# Patient Record
Sex: Female | Born: 1970 | Race: White | Hispanic: No | Smoking: Current some day smoker
Health system: Southern US, Community
[De-identification: ages and names within clinical notes are randomized; demographics above are authoritative.]

## PROBLEM LIST (undated history)

## (undated) DIAGNOSIS — J45909 Unspecified asthma, uncomplicated: Secondary | ICD-10-CM

## (undated) DIAGNOSIS — J439 Emphysema, unspecified: Secondary | ICD-10-CM

## (undated) DIAGNOSIS — C801 Malignant (primary) neoplasm, unspecified: Secondary | ICD-10-CM

## (undated) DIAGNOSIS — M659 Synovitis and tenosynovitis, unspecified: Secondary | ICD-10-CM

## (undated) DIAGNOSIS — J449 Chronic obstructive pulmonary disease, unspecified: Secondary | ICD-10-CM

## (undated) DIAGNOSIS — G629 Polyneuropathy, unspecified: Secondary | ICD-10-CM

## (undated) DIAGNOSIS — L0390 Cellulitis, unspecified: Secondary | ICD-10-CM

## (undated) DIAGNOSIS — M797 Fibromyalgia: Secondary | ICD-10-CM

## (undated) HISTORY — PX: BACK SURGERY: SHX140

## (undated) HISTORY — PX: TUBAL LIGATION: SHX77

## (undated) HISTORY — PX: TONSILLECTOMY: SUR1361

---

## 1997-11-14 ENCOUNTER — Inpatient Hospital Stay (HOSPITAL_COMMUNITY): Admission: EM | Admit: 1997-11-14 | Discharge: 1997-11-16 | Payer: Self-pay | Admitting: Emergency Medicine

## 1998-01-23 ENCOUNTER — Emergency Department (HOSPITAL_COMMUNITY): Admission: EM | Admit: 1998-01-23 | Discharge: 1998-01-23 | Payer: Self-pay | Admitting: Emergency Medicine

## 1998-04-11 ENCOUNTER — Emergency Department (HOSPITAL_COMMUNITY): Admission: EM | Admit: 1998-04-11 | Discharge: 1998-04-11 | Payer: Self-pay | Admitting: Emergency Medicine

## 1999-08-23 ENCOUNTER — Encounter: Payer: Self-pay | Admitting: Emergency Medicine

## 1999-08-23 ENCOUNTER — Emergency Department (HOSPITAL_COMMUNITY): Admission: EM | Admit: 1999-08-23 | Discharge: 1999-08-23 | Payer: Self-pay | Admitting: Emergency Medicine

## 2001-07-16 ENCOUNTER — Ambulatory Visit (HOSPITAL_COMMUNITY): Admission: RE | Admit: 2001-07-16 | Discharge: 2001-07-16 | Payer: Self-pay | Admitting: Neurosurgery

## 2004-02-23 ENCOUNTER — Emergency Department: Payer: Self-pay | Admitting: Unknown Physician Specialty

## 2004-04-18 ENCOUNTER — Emergency Department: Payer: Self-pay | Admitting: General Practice

## 2004-04-28 ENCOUNTER — Emergency Department: Payer: Self-pay | Admitting: Emergency Medicine

## 2004-04-29 ENCOUNTER — Emergency Department: Payer: Self-pay | Admitting: Emergency Medicine

## 2004-12-31 ENCOUNTER — Emergency Department: Payer: Self-pay | Admitting: General Practice

## 2005-01-01 ENCOUNTER — Emergency Department: Payer: Self-pay | Admitting: Emergency Medicine

## 2005-12-12 ENCOUNTER — Emergency Department: Payer: Self-pay

## 2006-06-29 ENCOUNTER — Emergency Department: Payer: Self-pay | Admitting: Emergency Medicine

## 2007-02-24 ENCOUNTER — Emergency Department: Payer: Self-pay | Admitting: Emergency Medicine

## 2007-07-04 ENCOUNTER — Emergency Department (HOSPITAL_COMMUNITY): Admission: EM | Admit: 2007-07-04 | Discharge: 2007-07-04 | Payer: Self-pay | Admitting: Emergency Medicine

## 2008-01-24 ENCOUNTER — Inpatient Hospital Stay: Payer: Self-pay | Admitting: *Deleted

## 2008-03-07 ENCOUNTER — Emergency Department: Payer: Self-pay | Admitting: Emergency Medicine

## 2008-03-14 ENCOUNTER — Encounter: Payer: Self-pay | Admitting: Internal Medicine

## 2008-04-09 ENCOUNTER — Encounter: Payer: Self-pay | Admitting: Internal Medicine

## 2009-09-09 ENCOUNTER — Emergency Department: Payer: Self-pay | Admitting: Emergency Medicine

## 2010-04-27 ENCOUNTER — Emergency Department: Payer: Self-pay | Admitting: Emergency Medicine

## 2010-05-19 ENCOUNTER — Encounter: Payer: Self-pay | Admitting: Nurse Practitioner

## 2010-05-24 ENCOUNTER — Emergency Department: Payer: Self-pay | Admitting: Emergency Medicine

## 2011-05-07 ENCOUNTER — Ambulatory Visit: Payer: Self-pay | Admitting: Family Medicine

## 2011-06-10 ENCOUNTER — Ambulatory Visit: Payer: Self-pay | Admitting: Family Medicine

## 2011-07-24 ENCOUNTER — Emergency Department: Payer: Self-pay | Admitting: Emergency Medicine

## 2011-08-17 ENCOUNTER — Ambulatory Visit: Payer: Self-pay | Admitting: Family Medicine

## 2011-09-22 ENCOUNTER — Emergency Department: Payer: Self-pay | Admitting: Emergency Medicine

## 2011-09-22 LAB — COMPREHENSIVE METABOLIC PANEL
Albumin: 3.1 g/dL — ABNORMAL LOW (ref 3.4–5.0)
Alkaline Phosphatase: 81 U/L (ref 50–136)
Anion Gap: 7 (ref 7–16)
BUN: 4 mg/dL — ABNORMAL LOW (ref 7–18)
Bilirubin,Total: 0.5 mg/dL (ref 0.2–1.0)
Calcium, Total: 8.2 mg/dL — ABNORMAL LOW (ref 8.5–10.1)
Creatinine: 0.63 mg/dL (ref 0.60–1.30)
EGFR (Non-African Amer.): >60
Osmolality: 276 (ref 275–301)
Potassium: 3.2 mmol/L — ABNORMAL LOW (ref 3.5–5.1)
SGOT(AST): 19 U/L (ref 15–37)

## 2011-09-22 LAB — URINALYSIS, COMPLETE
Bacteria: NONE SEEN
Bilirubin,UR: NEGATIVE
Blood: NEGATIVE
Glucose,UR: NEGATIVE mg/dL (ref 0–75)
Ketone: NEGATIVE
Leukocyte Esterase: NEGATIVE
Ph: 7 (ref 4.5–8.0)
RBC,UR: NONE SEEN /HPF (ref 0–5)
Specific Gravity: 1.002 (ref 1.003–1.030)
Squamous Epithelial: NONE SEEN

## 2011-09-22 LAB — CBC
HGB: 12.6 g/dL (ref 12.0–16.0)
MCH: 28.3 pg (ref 26.0–34.0)
MCHC: 32.4 g/dL (ref 32.0–36.0)
Platelet: 281 10*3/uL (ref 150–440)
RBC: 4.46 10*6/uL (ref 3.80–5.20)

## 2011-09-22 LAB — DRUG SCREEN, URINE
Amphetamines, Ur Screen: POSITIVE (ref ?–1000)
Barbiturates, Ur Screen: NEGATIVE (ref ?–200)
Cannabinoid 50 Ng, Ur ~~LOC~~: NEGATIVE (ref ?–50)
MDMA (Ecstasy)Ur Screen: NEGATIVE (ref ?–500)
Methadone, Ur Screen: NEGATIVE (ref ?–300)
Opiate, Ur Screen: POSITIVE (ref ?–300)
Phencyclidine (PCP) Ur S: NEGATIVE (ref ?–25)
Tricyclic, Ur Screen: NEGATIVE (ref ?–1000)

## 2011-09-22 LAB — ACETAMINOPHEN LEVEL: Acetaminophen: <2 ug/mL

## 2011-09-22 LAB — SALICYLATE LEVEL: Salicylates, Serum: 2.3 mg/dL

## 2011-09-22 LAB — CK TOTAL AND CKMB (NOT AT ARMC): CK-MB: 0.5 ng/mL (ref 0.5–3.6)

## 2011-09-22 LAB — TSH: Thyroid Stimulating Horm: 0.52 u[IU]/mL

## 2011-10-17 ENCOUNTER — Inpatient Hospital Stay: Payer: Self-pay | Admitting: Internal Medicine

## 2011-10-17 LAB — CBC
HCT: 39.1 % (ref 35.0–47.0)
HGB: 12.8 g/dL (ref 12.0–16.0)
MCV: 88 fL (ref 80–100)
WBC: 10.1 10*3/uL (ref 3.6–11.0)

## 2011-10-17 LAB — URINALYSIS, COMPLETE
Bilirubin,UR: NEGATIVE
Glucose,UR: 50 mg/dL (ref 0–75)
Ketone: NEGATIVE
Protein: 30
RBC,UR: 1 /HPF (ref 0–5)
Squamous Epithelial: 9
WBC UR: 2 /HPF (ref 0–5)

## 2011-10-17 LAB — DRUG SCREEN, URINE
Amphetamines, Ur Screen: NEGATIVE (ref ?–1000)
Barbiturates, Ur Screen: NEGATIVE (ref ?–200)
Benzodiazepine, Ur Scrn: POSITIVE (ref ?–200)
Cocaine Metabolite,Ur ~~LOC~~: POSITIVE (ref ?–300)
Methadone, Ur Screen: NEGATIVE (ref ?–300)
Phencyclidine (PCP) Ur S: NEGATIVE (ref ?–25)
Tricyclic, Ur Screen: NEGATIVE (ref ?–1000)

## 2011-10-17 LAB — ETHANOL
Ethanol %: <0.003 % (ref 0.000–0.080)
Ethanol: <3 mg/dL

## 2011-10-17 LAB — COMPREHENSIVE METABOLIC PANEL
Albumin: 3.5 g/dL (ref 3.4–5.0)
Anion Gap: 5 — ABNORMAL LOW (ref 7–16)
BUN: 7 mg/dL (ref 7–18)
Chloride: 104 mmol/L (ref 98–107)
Co2: 29 mmol/L (ref 21–32)
Creatinine: 0.63 mg/dL (ref 0.60–1.30)
EGFR (African American): >60
EGFR (Non-African Amer.): >60
Glucose: 64 mg/dL — ABNORMAL LOW (ref 65–99)
SGOT(AST): 25 U/L (ref 15–37)
SGPT (ALT): 20 U/L (ref 12–78)

## 2011-10-18 ENCOUNTER — Inpatient Hospital Stay: Payer: Self-pay | Admitting: Psychiatry

## 2011-10-18 LAB — BASIC METABOLIC PANEL
Chloride: 100 mmol/L (ref 98–107)
Creatinine: 0.86 mg/dL (ref 0.60–1.30)
EGFR (Non-African Amer.): >60
Glucose: 331 mg/dL — ABNORMAL HIGH (ref 65–99)
Potassium: 4.2 mmol/L (ref 3.5–5.1)
Sodium: 135 mmol/L — ABNORMAL LOW (ref 136–145)

## 2011-10-18 LAB — CBC WITH DIFFERENTIAL/PLATELET
Basophil %: 0.1 %
Eosinophil #: 0 10*3/uL (ref 0.0–0.7)
HGB: 13.9 g/dL (ref 12.0–16.0)
Lymphocyte %: 4.2 %
MCV: 89 fL (ref 80–100)
Monocyte #: 0.1 x10 3/mm — ABNORMAL LOW (ref 0.2–0.9)
Neutrophil #: 10.8 10*3/uL — ABNORMAL HIGH (ref 1.4–6.5)
Neutrophil %: 95.2 %
Platelet: 346 10*3/uL (ref 150–440)
RBC: 4.69 10*6/uL (ref 3.80–5.20)
WBC: 11.3 10*3/uL — ABNORMAL HIGH (ref 3.6–11.0)

## 2011-10-18 LAB — URINE CULTURE

## 2011-10-18 LAB — HEMOGLOBIN A1C: Hemoglobin A1C: 8.2 % — ABNORMAL HIGH (ref 4.2–6.3)

## 2011-10-18 LAB — MAGNESIUM: Magnesium: 1.8 mg/dL

## 2011-10-23 LAB — CULTURE, BLOOD (SINGLE)

## 2011-11-02 ENCOUNTER — Emergency Department: Payer: Self-pay | Admitting: *Deleted

## 2011-11-03 ENCOUNTER — Emergency Department (HOSPITAL_COMMUNITY): Payer: Medicaid Other

## 2011-11-03 ENCOUNTER — Encounter (HOSPITAL_COMMUNITY): Payer: Self-pay | Admitting: Emergency Medicine

## 2011-11-03 ENCOUNTER — Emergency Department (HOSPITAL_COMMUNITY)
Admission: EM | Admit: 2011-11-03 | Discharge: 2011-11-03 | Disposition: A | Discharge status: Home / Self Care | Payer: Medicaid Other | Attending: Emergency Medicine | Admitting: Emergency Medicine

## 2011-11-03 DIAGNOSIS — E119 Type 2 diabetes mellitus without complications: Secondary | ICD-10-CM | POA: Insufficient documentation

## 2011-11-03 DIAGNOSIS — Z79899 Other long term (current) drug therapy: Secondary | ICD-10-CM | POA: Insufficient documentation

## 2011-11-03 DIAGNOSIS — Z981 Arthrodesis status: Secondary | ICD-10-CM | POA: Insufficient documentation

## 2011-11-03 DIAGNOSIS — Z888 Allergy status to other drugs, medicaments and biological substances status: Secondary | ICD-10-CM | POA: Insufficient documentation

## 2011-11-03 DIAGNOSIS — J438 Other emphysema: Secondary | ICD-10-CM | POA: Insufficient documentation

## 2011-11-03 DIAGNOSIS — Z886 Allergy status to analgesic agent status: Secondary | ICD-10-CM | POA: Insufficient documentation

## 2011-11-03 DIAGNOSIS — M549 Dorsalgia, unspecified: Secondary | ICD-10-CM | POA: Insufficient documentation

## 2011-11-03 DIAGNOSIS — M5430 Sciatica, unspecified side: Secondary | ICD-10-CM | POA: Insufficient documentation

## 2011-11-03 DIAGNOSIS — W1789XA Other fall from one level to another, initial encounter: Secondary | ICD-10-CM | POA: Insufficient documentation

## 2011-11-03 HISTORY — DX: Chronic obstructive pulmonary disease, unspecified: J44.9

## 2011-11-03 HISTORY — DX: Emphysema, unspecified: J43.9

## 2011-11-03 MED ORDER — OXYCODONE-ACETAMINOPHEN 5-325 MG PO TABS: 2.0000 | ORAL_TABLET | ORAL | Status: DC | PRN

## 2011-11-03 MED ORDER — KETOROLAC TROMETHAMINE 60 MG/2ML IM SOLN
60.0000 mg | Freq: Once | INTRAMUSCULAR | Status: AC
  Administered 2011-11-03: 60 mg via INTRAMUSCULAR
  Filled 2011-11-03: qty 2

## 2011-11-03 MED ORDER — OXYCODONE-ACETAMINOPHEN 5-325 MG PO TABS
1.0000 | ORAL_TABLET | Freq: Once | ORAL | Status: AC
  Administered 2011-11-03: 1 via ORAL
  Filled 2011-11-03: qty 1

## 2011-11-03 NOTE — ED Provider Notes (Signed)
History     CSN: [id number]  Arrival date & time 11/03/11  0116   First MD Initiated Contact with Patient 11/03/11 0215      Chief Complaint  Patient presents with  . Fall    (Consider location/radiation/quality/duration/timing/severity/associated sxs/prior treatment) Patient is a 41 y.o. female presenting with fall. The history is provided by the patient.  Fall The accident occurred yesterday. The fall occurred while walking. She fell from a height of 1 to 2 ft. She landed on carpet. There was no blood loss. The point of impact was the left hip. Pain location: back. The pain is at a severity of 6/10. The pain is mild. She was ambulatory at the scene. There was no entrapment after the fall. There was no drug use involved in the accident. There was no alcohol use involved in the accident. Pertinent negatives include no numbness and no bowel incontinence. She has tried nothing for the symptoms.    Past Medical History  Diagnosis Date  . Diabetes mellitus   . COPD (chronic obstructive pulmonary disease)   . Emphysema     Past Surgical History  Procedure Date  . Back surgery     No family history on file.  History  Substance Use Topics  . Smoking status: Current Every Day Smoker  . Smokeless tobacco: Not on file  . Alcohol Use: No    OB History    Grav Para Term Preterm Abortions TAB SAB Ect Mult Living                  Review of Systems  Gastrointestinal: Negative for bowel incontinence.  Musculoskeletal: Positive for back pain.  Neurological: Negative for numbness.    Allergies  Darvocet; Flexeril; and Vicodin  Home Medications   Current Outpatient Rx  Name Route Sig Dispense Refill  . ALBUTEROL SULFATE HFA 108 (90 BASE) MCG/ACT IN AERS Inhalation Inhale 2 puffs into the lungs every 6 (six) hours as needed. For wheeze or shortness of breath    . ALPRAZOLAM 0.5 MG PO TABS Oral Take 0.5 mg by mouth 4 (four) times daily.    . BUDESONIDE-FORMOTEROL FUMARATE  160-4.5 MCG/ACT IN AERO Inhalation Inhale 2 puffs into the lungs 2 (two) times daily.    . INSULIN ASPART 100 UNIT/ML [place] SOLN Subcutaneous Inject 2-12 Units into the skin 3 (three) times daily before meals. Based on blood sugar    . INSULIN ASPART PROT & ASPART (70-30) 100 UNIT/ML [place] SUSP Subcutaneous Inject 28-30 Units into the skin 3 (three) times daily.    Marland Kitchen LISINOPRIL 5 MG PO TABS Oral Take 5 mg by mouth daily.    . OXYCODONE-ACETAMINOPHEN 10-325 MG PO TABS Oral Take 1 tablet by mouth every 4 (four) hours as needed. For pain    . OXYMORPHONE HCL ER 30 MG PO TB12 Oral Take 30 mg by mouth every 12 (twelve) hours.    Marland Kitchen TIOTROPIUM BROMIDE MONOHYDRATE 18 MCG IN CAPS Inhalation Place 18 mcg into inhaler and inhale daily.      BP 156/88  Pulse 76  Temp 97.5 F (36.4 C) (Oral)  Resp 20  SpO2 99%  LMP 09/29/2011  Physical Exam  Constitutional: She is oriented to person, place, and time. She appears well-developed and well-nourished.  HENT:  Head: Normocephalic and atraumatic.  Eyes: Conjunctivae normal and EOM are normal. Pupils are equal, round, and reactive to light.  Neck: Normal range of motion.  Cardiovascular: Normal rate, regular rhythm and normal heart sounds.  Pulmonary/Chest: Effort normal and breath sounds normal.  Abdominal: Soft. Bowel sounds are normal.  Musculoskeletal: Normal range of motion.  Neurological: She is alert and oriented to person, place, and time.  Skin: Skin is warm and dry.  Psychiatric: She has a normal mood and affect. Her behavior is normal.    ED Course  Procedures (including critical care time)  Labs Reviewed - No data to display Dg Lumbar Spine Complete  11/03/2011  *RADIOLOGY REPORT*  Clinical Data: 41 year old female status post fall.  Pain.  Prior lumbar fusion.  LUMBAR SPINE - COMPLETE 4+ VIEW  Comparison: 07/04/2007.  Findings: Normal lumbar segmentation.  Sequelae of L4-L5 and L5-S1 transpedicular fusion.  Chronic S1 screw fragment.   Interbody implant at L5-S1.  Hardware appears stable since 2009.  Vertebral height and alignment appears stable.  Other disc spaces remain relatively preserved.  Sacral ala and SI joints are within normal limits.  IMPRESSION: Stable postoperative appearance of the lumbar spine since 2009.   Original Report Authenticated By: [name], M.D.      No diagnosis found.    MDM  + chronic back pain and sciatica.  No sxs of epidural compression.  No fracture on xray.  Will dc        Katie Mich Lytle Michaels, MD 11/03/11 [phone]

## 2011-11-03 NOTE — ED Notes (Signed)
PT ambulated with baseline gait; VSS; A&Ox3; no signs of distress; respirations even and unlabored; skin warm and dry; no questions upon discharge.  

## 2011-11-03 NOTE — ED Notes (Addendum)
PT. TRIPPED /LOST BALANCE AND FELL  WHILE ASSISTING A PERSON THIS EVENING , REPORTS PAIN AT LOWER BACK RADIATING TO RIGHT LEG.  NO LOC / AMBULATORY. PT STATES HISTORY OF BACK SURGERY.

## 2012-02-08 ENCOUNTER — Emergency Department: Payer: Self-pay | Admitting: Emergency Medicine

## 2012-05-26 ENCOUNTER — Emergency Department: Payer: Self-pay | Admitting: Emergency Medicine

## 2012-09-11 ENCOUNTER — Inpatient Hospital Stay: Payer: Self-pay | Admitting: Internal Medicine

## 2012-09-11 LAB — BASIC METABOLIC PANEL
Anion Gap: 8 (ref 7–16)
Co2: 26 mmol/L (ref 21–32)
Creatinine: 0.56 mg/dL — ABNORMAL LOW (ref 0.60–1.30)
EGFR (African American): >60
Glucose: 281 mg/dL — ABNORMAL HIGH (ref 65–99)
Osmolality: 277 (ref 275–301)
Potassium: 3.3 mmol/L — ABNORMAL LOW (ref 3.5–5.1)

## 2012-09-11 LAB — URINALYSIS, COMPLETE
Bilirubin,UR: NEGATIVE
Blood: NEGATIVE
Glucose,UR: >=500 mg/dL (ref 0–75)
Leukocyte Esterase: NEGATIVE
Nitrite: NEGATIVE
Protein: 30
RBC,UR: 6 /HPF (ref 0–5)
Specific Gravity: 1.037 (ref 1.003–1.030)

## 2012-09-11 LAB — PREGNANCY, URINE: Pregnancy Test, Urine: NEGATIVE m[IU]/mL

## 2012-09-11 LAB — CBC
HCT: 40.3 % (ref 35.0–47.0)
MCH: 30 pg (ref 26.0–34.0)
MCHC: 34.7 g/dL (ref 32.0–36.0)
Platelet: 256 10*3/uL (ref 150–440)
RBC: 4.67 10*6/uL (ref 3.80–5.20)
WBC: 17.7 10*3/uL — ABNORMAL HIGH (ref 3.6–11.0)

## 2012-09-11 LAB — DRUG SCREEN, URINE
Amphetamines, Ur Screen: NEGATIVE (ref ?–1000)
Benzodiazepine, Ur Scrn: NEGATIVE (ref ?–200)
Cannabinoid 50 Ng, Ur ~~LOC~~: NEGATIVE (ref ?–50)
Cocaine Metabolite,Ur ~~LOC~~: NEGATIVE (ref ?–300)
Methadone, Ur Screen: NEGATIVE (ref ?–300)
Opiate, Ur Screen: POSITIVE (ref ?–300)
Phencyclidine (PCP) Ur S: NEGATIVE (ref ?–25)
Tricyclic, Ur Screen: POSITIVE (ref ?–1000)

## 2012-09-12 LAB — CBC WITH DIFFERENTIAL/PLATELET
Basophil #: 0.1 10*3/uL (ref 0.0–0.1)
Eosinophil #: 0 10*3/uL (ref 0.0–0.7)
Eosinophil %: 0 %
HCT: 42.2 % (ref 35.0–47.0)
HGB: 14.5 g/dL (ref 12.0–16.0)
Lymphocyte %: 4.9 %
MCH: 29.8 pg (ref 26.0–34.0)
Monocyte %: 1.6 %
Neutrophil #: 12.8 10*3/uL — ABNORMAL HIGH (ref 1.4–6.5)
Neutrophil %: 92.5 %
Platelet: 245 10*3/uL (ref 150–440)
RDW: 13.5 % (ref 11.5–14.5)
WBC: 13.9 10*3/uL — ABNORMAL HIGH (ref 3.6–11.0)

## 2012-09-12 LAB — BASIC METABOLIC PANEL
Anion Gap: 7 (ref 7–16)
BUN: 10 mg/dL (ref 7–18)
Calcium, Total: 9.4 mg/dL (ref 8.5–10.1)
Chloride: 101 mmol/L (ref 98–107)
Potassium: 3.9 mmol/L (ref 3.5–5.1)

## 2012-09-12 LAB — MAGNESIUM: Magnesium: 1.7 mg/dL — ABNORMAL LOW

## 2012-09-13 LAB — BASIC METABOLIC PANEL
BUN: 14 mg/dL (ref 7–18)
Calcium, Total: 9.1 mg/dL (ref 8.5–10.1)
Co2: 27 mmol/L (ref 21–32)
EGFR (African American): >60
EGFR (Non-African Amer.): >60
Glucose: 370 mg/dL — ABNORMAL HIGH (ref 65–99)
Sodium: 135 mmol/L — ABNORMAL LOW (ref 136–145)

## 2012-09-17 LAB — CULTURE, BLOOD (SINGLE)

## 2012-09-25 ENCOUNTER — Emergency Department (HOSPITAL_COMMUNITY)
Admission: EM | Admit: 2012-09-25 | Discharge: 2012-09-25 | Disposition: A | Discharge status: Home / Self Care | Payer: Medicaid Other | Attending: Emergency Medicine | Admitting: Emergency Medicine

## 2012-09-25 ENCOUNTER — Emergency Department (HOSPITAL_COMMUNITY): Payer: Medicaid Other

## 2012-09-25 ENCOUNTER — Encounter (HOSPITAL_COMMUNITY): Payer: Self-pay | Admitting: *Deleted

## 2012-09-25 DIAGNOSIS — B3789 Other sites of candidiasis: Secondary | ICD-10-CM | POA: Insufficient documentation

## 2012-09-25 DIAGNOSIS — E119 Type 2 diabetes mellitus without complications: Secondary | ICD-10-CM | POA: Diagnosis not present

## 2012-09-25 DIAGNOSIS — Y9301 Activity, walking, marching and hiking: Secondary | ICD-10-CM | POA: Diagnosis not present

## 2012-09-25 DIAGNOSIS — Z794 Long term (current) use of insulin: Secondary | ICD-10-CM | POA: Diagnosis not present

## 2012-09-25 DIAGNOSIS — B3749 Other urogenital candidiasis: Secondary | ICD-10-CM

## 2012-09-25 DIAGNOSIS — K644 Residual hemorrhoidal skin tags: Secondary | ICD-10-CM | POA: Diagnosis not present

## 2012-09-25 DIAGNOSIS — IMO0002 Reserved for concepts with insufficient information to code with codable children: Secondary | ICD-10-CM | POA: Insufficient documentation

## 2012-09-25 DIAGNOSIS — R296 Repeated falls: Secondary | ICD-10-CM | POA: Diagnosis not present

## 2012-09-25 DIAGNOSIS — J438 Other emphysema: Secondary | ICD-10-CM | POA: Insufficient documentation

## 2012-09-25 DIAGNOSIS — Y929 Unspecified place or not applicable: Secondary | ICD-10-CM | POA: Insufficient documentation

## 2012-09-25 DIAGNOSIS — G8929 Other chronic pain: Secondary | ICD-10-CM

## 2012-09-25 DIAGNOSIS — Z79899 Other long term (current) drug therapy: Secondary | ICD-10-CM | POA: Insufficient documentation

## 2012-09-25 DIAGNOSIS — F17200 Nicotine dependence, unspecified, uncomplicated: Secondary | ICD-10-CM | POA: Insufficient documentation

## 2012-09-25 LAB — GLUCOSE, CAPILLARY: Glucose-Capillary: 102 mg/dL — ABNORMAL HIGH (ref 70–99)

## 2012-09-25 MED ORDER — METHOCARBAMOL 500 MG PO TABS: 1000.0000 mg | ORAL_TABLET | Freq: Four times a day (QID) | ORAL | Status: DC

## 2012-09-25 MED ORDER — FENTANYL CITRATE 0.05 MG/ML IJ SOLN
100.0000 ug | Freq: Once | INTRAMUSCULAR | Status: AC
  Administered 2012-09-25: 100 ug via INTRAMUSCULAR
  Filled 2012-09-25: qty 2

## 2012-09-25 MED ORDER — OXYCODONE-ACETAMINOPHEN 5-325 MG PO TABS: 1.0000 | ORAL_TABLET | Freq: Four times a day (QID) | ORAL | Status: DC | PRN

## 2012-09-25 MED ORDER — OXYCODONE-ACETAMINOPHEN 5-325 MG PO TABS
2.0000 | ORAL_TABLET | Freq: Once | ORAL | Status: AC
  Administered 2012-09-25: 2 via ORAL
  Filled 2012-09-25: qty 2

## 2012-09-25 NOTE — ED Provider Notes (Signed)
CSN: [id number]     Arrival date & time 09/25/12  1033 History     First MD Initiated Contact with Patient 09/25/12 1143     Chief Complaint  Patient presents with  . Fall  . Back Pain   (Consider location/radiation/quality/duration/timing/severity/associated sxs/prior Treatment) HPI Comments: Patient with history of chronic pain presents with complaint of mid and lower back pain and began acutely when she was pulled down this morning when walking dull. She states that the pain radiates down into her right leg. She denies red flag signs and symptoms of lower back pain. Pain is worse with movement and standing. Patient took her last Percocet 10-325 this morning. She states that she sees the pain clinic and takes Opana and Percocet regularly. She denies other injury. She is ambulatory with pain. Onset of symptoms acute. Course is constant.  Patient is a 42 y.o. female presenting with fall and back pain. The history is provided by the patient.  Fall Pertinent negatives include no fever, numbness or weakness.  Back Pain Associated symptoms: no dysuria, no fever, no numbness, no pelvic pain and no weakness     Past Medical History  Diagnosis Date  . Diabetes mellitus   . COPD (chronic obstructive pulmonary disease)   . Emphysema    Past Surgical History  Procedure Laterality Date  . Back surgery     History reviewed. No pertinent family history. History  Substance Use Topics  . Smoking status: Current Every Day Smoker  . Smokeless tobacco: Not on file  . Alcohol Use: No   OB History   Grav Para Term Preterm Abortions TAB SAB Ect Mult Living                 Review of Systems  Constitutional: Negative for fever and unexpected weight change.  Gastrointestinal: Negative for constipation.       Negative for fecal incontinence.   Genitourinary: Negative for dysuria, hematuria, flank pain, vaginal bleeding, vaginal discharge and pelvic pain.       Positive for urinary  incontinence. Negative for retention.   Musculoskeletal: Positive for back pain.  Neurological: Negative for weakness and numbness.       Denies saddle paresthesias.    Allergies  Darvocet; Flexeril; and Vicodin  Home Medications   Current Outpatient Rx  Name  Route  Sig  Dispense  Refill  . albuterol (PROVENTIL HFA;VENTOLIN HFA) 108 (90 BASE) MCG/ACT inhaler   Inhalation   Inhale 2 puffs into the lungs every 6 (six) hours as needed. For wheeze or shortness of breath         . albuterol (PROVENTIL) (2.5 MG/3ML) 0.083% nebulizer solution   Nebulization   Take 2.5 mg by nebulization every 6 (six) hours as needed for wheezing or shortness of breath.         . ALPRAZolam (XANAX) 0.5 MG tablet   Oral   Take 0.5 mg by mouth 4 (four) times daily.         . budesonide-formoterol (SYMBICORT) 160-4.5 MCG/ACT inhaler   Inhalation   Inhale 2 puffs into the lungs 2 (two) times daily.         . cephALEXin (KEFLEX) 500 MG capsule   Oral   Take 500 mg by mouth 2 (two) times daily.         Marland Kitchen ibuprofen (ADVIL,MOTRIN) 800 MG tablet   Oral   Take 1,600 mg by mouth every 8 (eight) hours as needed for pain.         Marland Kitchen  insulin aspart (NOVOLOG) 100 UNIT/ML injection   Subcutaneous   Inject 2-12 Units into the skin 3 (three) times daily before meals. Based on blood sugar         . insulin NPH-regular (NOVOLIN 70/30) (70-30) 100 UNIT/ML injection   Subcutaneous   Inject 75 Units into the skin 3 (three) times daily.         . metFORMIN (GLUCOPHAGE) 500 MG tablet   Oral   Take 500 mg by mouth 2 (two) times daily with a meal.         . oxyCODONE-acetaminophen (PERCOCET) 10-325 MG per tablet   Oral   Take 1 tablet by mouth every 4 (four) hours as needed. For pain         . oxymorphone (OPANA ER) 30 MG 12 hr tablet   Oral   Take 30 mg by mouth every 12 (twelve) hours.         . predniSONE (STERAPRED UNI-PAK) 5 MG TABS tablet   Oral   Take 5-30 mg by mouth daily.  Tapered dose, last dose due 09/26/12         . tiotropium (SPIRIVA) 18 MCG inhalation capsule   Inhalation   Place 18 mcg into inhaler and inhale daily.          BP 147/71  Pulse 115  Temp(Src) 99.1 F (37.3 C) (Oral)  Resp 18  SpO2 97% Physical Exam  Nursing note and vitals reviewed. Constitutional: She appears well-developed and well-nourished.  HENT:  Head: Normocephalic and atraumatic.  Eyes: Conjunctivae are normal.  Neck: Normal range of motion. Neck supple.  Pulmonary/Chest: Effort normal.  Abdominal: Soft. There is no tenderness. There is no CVA tenderness.  Genitourinary: Rectal exam shows external hemorrhoid. Rectal exam shows no fissure.  No saddle paresthesia, normal rectal tone. Rash in gluteal cleft and perineum c/w yeast.   Musculoskeletal: Normal range of motion.       Cervical back: Normal.       Thoracic back: She exhibits tenderness. She exhibits no bony tenderness.       Lumbar back: She exhibits tenderness and bony tenderness.       Back:  No step-off noted with palpation of spine.   Neurological: She is alert. She has normal strength and normal reflexes. No sensory deficit.  5/5 strength in entire lower extremities bilaterally. No sensation deficit.   Skin: Skin is warm and dry. No rash noted.  Psychiatric: She has a normal mood and affect.    ED Course   Procedures (including critical care time)  Labs Reviewed  GLUCOSE, CAPILLARY - Abnormal; Notable for the following:    Glucose-Capillary 102 (*)    All other components within normal limits   Dg Lumbar Spine Complete  09/25/2012   *RADIOLOGY REPORT*  Clinical Data: Fall with low back pain and incontinence.  LUMBAR SPINE - COMPLETE 4+ VIEW  Comparison: 11/08/2011  Findings: Again noted are bilateral pedicle screws with rod fixation at L4-S1. There are two additional screws within the sacrum.  Alignment of the lumbar spine is stable.  Vertebral body heights are maintained.  There is no evidence  for a hardware complication.  Stable appearance of the interbody device at L5-S1.  IMPRESSION: Stable postoperative appearance of the lower lumbar spine and lumbosacral junction.  No acute bony abnormality.   Original Report Authenticated By: Richarda Overlie, M.D.   1. Chronic back pain     12:21 PM Patient seen and examined. Medications ordered. X-ray ordered  due to h/o surgery and patient claims that she heard a 'pop'.   Vital signs reviewed and are as follows: Filed Vitals:   09/25/12 1100  BP: 147/71  Pulse:   Temp:   Resp:   BP 147/71  Pulse 115  Temp(Src) 99.1 F (37.3 C) (Oral)  Resp 18  SpO2 97%  3:08 PM Patient c/o continued pain. Now with urinary incontinence and fecal incontinence. She states she has had this during her recent hospitalization at [facility]. Rectal exam performed with nurse chaperone. Patient has normal rectal tone.   IM fentanyl ordered.    4:48 PM Patient feeling better prior to d/c. D/c to home with pain medication and muscle relaxer. Urged to f/u with pain management and PCP.   Patient counseled on use of narcotic pain medications. Counseled not to combine these medications with others containing tylenol. Urged not to drink alcohol, drive, or perform any other activities that requires focus while taking these medications. The patient verbalizes understanding and agrees with the plan.  Patient counseled on proper use of muscle relaxant medication.  They were told not to drink alcohol, drive any vehicle, or do any dangerous activities while taking this medication.  Patient verbalized understanding.  No red flag s/s of low back pain. Patient was counseled on back pain precautions and told to do activity as tolerated but do not lift, push, or pull heavy objects more than 10 pounds for the next week.  Patient counseled to use ice or heat on back for no longer than 15 minutes every hour.   Urged patient not to drink alcohol, drive, or perform any other activities  that requires focus while taking either of these medications.  Patient urged to follow-up with PCP if pain does not improve with treatment and rest or if pain becomes recurrent. Urged to return with worsening severe pain, loss of bowel or bladder control, trouble walking.   The patient verbalizes understanding and agrees with the plan.   MDM  Patient with back pain, chronic. Improved in ED. No neurological deficits but rectal performed as patient has ? of fecal incontinence -- no urinary retention. Normal rectal tone. X-ray neg, hardware intact. Patient is ambulatory. No warning symptoms of back pain including: loss of bowel or bladder control, night sweats, waking from sleep with back pain, unexplained fevers or weight loss, h/o cancer, IVDU, recent trauma. No concern for cauda equina, epidural abscess, or other serious cause of back pain. Conservative measures such as rest, ice/heat and pain medicine indicated with PCP follow-up if no improvement with conservative management.    Renne Crigler, PA-C 09/25/12 1651

## 2012-09-25 NOTE — ED Provider Notes (Signed)
Medical screening examination/treatment/procedure(s) were performed by non-physician practitioner and as supervising physician I was immediately available for consultation/collaboration.  Amrom Ore N Margie Urbanowicz, DO 09/25/12 1653 

## 2012-09-25 NOTE — ED Notes (Signed)
Reports walking her dog and was pulled down, having severe mid and lower back pain radiates down right leg. Hx of back surgery.

## 2012-09-26 LAB — OCCULT BLOOD, POC DEVICE: Fecal Occult Bld: NEGATIVE

## 2012-10-27 ENCOUNTER — Emergency Department: Payer: Self-pay | Admitting: Emergency Medicine

## 2012-10-27 LAB — COMPREHENSIVE METABOLIC PANEL
Albumin: 3.8 g/dL (ref 3.4–5.0)
Alkaline Phosphatase: 134 U/L (ref 50–136)
Chloride: 102 mmol/L (ref 98–107)
Co2: 27 mmol/L (ref 21–32)
Creatinine: 0.6 mg/dL (ref 0.60–1.30)
EGFR (African American): >60
EGFR (Non-African Amer.): >60
Glucose: 262 mg/dL — ABNORMAL HIGH (ref 65–99)
Osmolality: 275 (ref 275–301)
Potassium: 4.5 mmol/L (ref 3.5–5.1)
SGOT(AST): 41 U/L — ABNORMAL HIGH (ref 15–37)
SGPT (ALT): 27 U/L (ref 12–78)

## 2012-10-27 LAB — CBC
HGB: 15.6 g/dL (ref 12.0–16.0)
Platelet: 343 10*3/uL (ref 150–440)
RBC: 5.35 10*6/uL — ABNORMAL HIGH (ref 3.80–5.20)

## 2012-10-27 LAB — ETHANOL: Ethanol: <3 mg/dL

## 2012-10-27 LAB — TSH: Thyroid Stimulating Horm: 4.31 u[IU]/mL

## 2012-10-27 LAB — SALICYLATE LEVEL: Salicylates, Serum: 2.7 mg/dL

## 2012-10-27 LAB — ACETAMINOPHEN LEVEL: Acetaminophen: <2 ug/mL

## 2012-11-14 ENCOUNTER — Ambulatory Visit: Payer: Self-pay | Admitting: Adult Health

## 2012-11-14 ENCOUNTER — Ambulatory Visit: Payer: Self-pay | Admitting: Family Medicine

## 2013-01-24 ENCOUNTER — Ambulatory Visit: Payer: Self-pay

## 2013-05-07 ENCOUNTER — Emergency Department (HOSPITAL_COMMUNITY): Payer: Medicaid Other

## 2013-05-07 ENCOUNTER — Encounter (HOSPITAL_COMMUNITY): Payer: Self-pay | Admitting: Emergency Medicine

## 2013-05-07 ENCOUNTER — Emergency Department (HOSPITAL_COMMUNITY)
Admission: EM | Admit: 2013-05-07 | Discharge: 2013-05-07 | Disposition: A | Discharge status: Home / Self Care | Payer: Medicaid Other | Attending: Emergency Medicine | Admitting: Emergency Medicine

## 2013-05-07 DIAGNOSIS — J438 Other emphysema: Secondary | ICD-10-CM | POA: Insufficient documentation

## 2013-05-07 DIAGNOSIS — Z3202 Encounter for pregnancy test, result negative: Secondary | ICD-10-CM | POA: Insufficient documentation

## 2013-05-07 DIAGNOSIS — R079 Chest pain, unspecified: Secondary | ICD-10-CM | POA: Insufficient documentation

## 2013-05-07 DIAGNOSIS — E119 Type 2 diabetes mellitus without complications: Secondary | ICD-10-CM | POA: Insufficient documentation

## 2013-05-07 DIAGNOSIS — F17200 Nicotine dependence, unspecified, uncomplicated: Secondary | ICD-10-CM | POA: Diagnosis not present

## 2013-05-07 DIAGNOSIS — Z79899 Other long term (current) drug therapy: Secondary | ICD-10-CM | POA: Diagnosis not present

## 2013-05-07 DIAGNOSIS — R109 Unspecified abdominal pain: Secondary | ICD-10-CM

## 2013-05-07 DIAGNOSIS — R112 Nausea with vomiting, unspecified: Secondary | ICD-10-CM | POA: Insufficient documentation

## 2013-05-07 DIAGNOSIS — Z794 Long term (current) use of insulin: Secondary | ICD-10-CM | POA: Insufficient documentation

## 2013-05-07 DIAGNOSIS — Z9851 Tubal ligation status: Secondary | ICD-10-CM | POA: Diagnosis not present

## 2013-05-07 DIAGNOSIS — R1110 Vomiting, unspecified: Secondary | ICD-10-CM

## 2013-05-07 LAB — COMPREHENSIVE METABOLIC PANEL
ALT: 21 U/L (ref 0–35)
AST: 25 U/L (ref 0–37)
Albumin: 3.3 g/dL — ABNORMAL LOW (ref 3.5–5.2)
Alkaline Phosphatase: 105 U/L (ref 39–117)
BUN: 8 mg/dL (ref 6–23)
CALCIUM: 8.7 mg/dL (ref 8.4–10.5)
CO2: 23 meq/L (ref 19–32)
CREATININE: 0.45 mg/dL — AB (ref 0.50–1.10)
Chloride: 91 mEq/L — ABNORMAL LOW (ref 96–112)
GLUCOSE: 397 mg/dL — AB (ref 70–99)
Potassium: 3.7 mEq/L (ref 3.7–5.3)
SODIUM: 132 meq/L — AB (ref 137–147)
TOTAL PROTEIN: 6.9 g/dL (ref 6.0–8.3)
Total Bilirubin: 0.3 mg/dL (ref 0.3–1.2)

## 2013-05-07 LAB — URINALYSIS, ROUTINE W REFLEX MICROSCOPIC
Bilirubin Urine: NEGATIVE
Glucose, UA: >1000 mg/dL — AB
HGB URINE DIPSTICK: NEGATIVE
Ketones, ur: 15 mg/dL — AB
Leukocytes, UA: NEGATIVE
NITRITE: NEGATIVE
PROTEIN: NEGATIVE mg/dL
SPECIFIC GRAVITY, URINE: 1.044 — AB (ref 1.005–1.030)
UROBILINOGEN UA: 0.2 mg/dL (ref 0.0–1.0)
pH: 6 (ref 5.0–8.0)

## 2013-05-07 LAB — CBC WITH DIFFERENTIAL/PLATELET
BASOS PCT: 0 % (ref 0–1)
Basophils Absolute: 0 10*3/uL (ref 0.0–0.1)
Eosinophils Absolute: 0 10*3/uL (ref 0.0–0.7)
Eosinophils Relative: 1 % (ref 0–5)
HEMATOCRIT: 46.8 % — AB (ref 36.0–46.0)
HEMOGLOBIN: 16.5 g/dL — AB (ref 12.0–15.0)
LYMPHS ABS: 0.9 10*3/uL (ref 0.7–4.0)
LYMPHS PCT: 15 % (ref 12–46)
MCH: 30.7 pg (ref 26.0–34.0)
MCHC: 35.3 g/dL (ref 30.0–36.0)
MCV: 87.2 fL (ref 78.0–100.0)
MONO ABS: 0.5 10*3/uL (ref 0.1–1.0)
MONOS PCT: 8 % (ref 3–12)
NEUTROS ABS: 4.6 10*3/uL (ref 1.7–7.7)
NEUTROS PCT: 76 % (ref 43–77)
Platelets: 238 10*3/uL (ref 150–400)
RBC: 5.37 MIL/uL — AB (ref 3.87–5.11)
RDW: 13.1 % (ref 11.5–15.5)
WBC: 6 10*3/uL (ref 4.0–10.5)

## 2013-05-07 LAB — URINE MICROSCOPIC-ADD ON

## 2013-05-07 LAB — I-STAT TROPONIN, ED: TROPONIN I, POC: 0 ng/mL (ref 0.00–0.08)

## 2013-05-07 LAB — POC URINE PREG, ED: PREG TEST UR: NEGATIVE

## 2013-05-07 LAB — LIPASE, BLOOD: Lipase: 27 U/L (ref 11–59)

## 2013-05-07 MED ORDER — ONDANSETRON HCL 4 MG/2ML IJ SOLN
4.0000 mg | Freq: Once | INTRAMUSCULAR | Status: AC
  Administered 2013-05-07: 4 mg via INTRAVENOUS
  Filled 2013-05-07: qty 2

## 2013-05-07 MED ORDER — HYDROMORPHONE HCL PF 1 MG/ML IJ SOLN
1.0000 mg | INTRAMUSCULAR | Status: AC | PRN
  Administered 2013-05-07 (×2): 1 mg via INTRAVENOUS
  Filled 2013-05-07 (×2): qty 1

## 2013-05-07 MED ORDER — ONDANSETRON 4 MG PO TBDP: 4.0000 mg | ORAL_TABLET | Freq: Three times a day (TID) | ORAL | Status: DC | PRN

## 2013-05-07 MED ORDER — IOHEXOL 300 MG/ML  SOLN
50.0000 mL | Freq: Once | INTRAMUSCULAR | Status: AC | PRN
  Administered 2013-05-07: 50 mL via ORAL

## 2013-05-07 MED ORDER — OXYCODONE-ACETAMINOPHEN 5-325 MG PO TABS
2.0000 | ORAL_TABLET | ORAL | Status: DC | PRN
Start: 2013-05-07 — End: 2014-08-03

## 2013-05-07 MED ORDER — SODIUM CHLORIDE 0.9 % IV BOLUS (SEPSIS)
1000.0000 mL | Freq: Once | INTRAVENOUS | Status: AC
  Administered 2013-05-07: 1000 mL via INTRAVENOUS

## 2013-05-07 MED ORDER — OMEPRAZOLE 20 MG PO CPDR: 20.0000 mg | DELAYED_RELEASE_CAPSULE | Freq: Two times a day (BID) | ORAL | Status: DC

## 2013-05-07 MED ORDER — IOHEXOL 300 MG/ML  SOLN
100.0000 mL | Freq: Once | INTRAMUSCULAR | Status: AC | PRN
  Administered 2013-05-07: 100 mL via INTRAVENOUS

## 2013-05-07 NOTE — ED Notes (Signed)
Pt gone to radiology dept, left PO contrast in pt's room and informed RN it was there.  Left instructions with RN to please scan PO contrast into MAR and call us when pt completes 1st cup.

## 2013-05-07 NOTE — ED Notes (Signed)
Pt. C/o bilateral upper abdominal pain radiating to back with N/V. States intermittent blood in stool and small amount in urine. Pt. Tender in upper abdominal quadrants with palpation.

## 2013-05-07 NOTE — ED Notes (Signed)
Pt and family informed upon requesting pain med that the PRN med not due yet.

## 2013-05-07 NOTE — ED Notes (Signed)
Pt reports pain to right and left side abd and epigastric area. Having n/v yesterday. Reports left side chest pain that's radiating down left arm.  No acute distress noted at triage, ekg done.

## 2013-05-07 NOTE — Discharge Instructions (Signed)
Clear liquids. Prilosec as prescribed. Zofran as needed for nausea.  Abdominal Pain, Women Abdominal (stomach, pelvic, or belly) pain can be caused by many things. It is important to tell your doctor:  The location of the pain.  Does it come and go or is it present all the time?  Are there things that start the pain (eating certain foods, exercise)?  Are there other symptoms associated with the pain (fever, nausea, vomiting, diarrhea)? All of this is helpful to know when trying to find the cause of the pain. CAUSES   Stomach: virus or bacteria infection, or ulcer.  Intestine: appendicitis (inflamed appendix), regional ileitis (Crohn's disease), ulcerative colitis (inflamed colon), irritable bowel syndrome, diverticulitis (inflamed diverticulum of the colon), or cancer of the stomach or intestine.  Gallbladder disease or stones in the gallbladder.  Kidney disease, kidney stones, or infection.  Pancreas infection or cancer.  Fibromyalgia (pain disorder).  Diseases of the female organs:  Uterus: fibroid (non-cancerous) tumors or infection.  Fallopian tubes: infection or tubal pregnancy.  Ovary: cysts or tumors.  Pelvic adhesions (scar tissue).  Endometriosis (uterus lining tissue growing in the pelvis and on the pelvic organs).  Pelvic congestion syndrome (female organs filling up with blood just before the menstrual period).  Pain with the menstrual period.  Pain with ovulation (producing an egg).  Pain with an IUD (intrauterine device, birth control) in the uterus.  Cancer of the female organs.  Functional pain (pain not caused by a disease, may improve without treatment).  Psychological pain.  Depression. DIAGNOSIS  Your doctor will decide the seriousness of your pain by doing an examination.  Blood tests.  X-rays.  Ultrasound.  CT scan (computed tomography, special type of X-ray).  MRI (magnetic resonance imaging).  Cultures, for  infection.  Barium enema (dye inserted in the large intestine, to better view it with X-rays).  Colonoscopy (looking in intestine with a lighted tube).  Laparoscopy (minor surgery, looking in abdomen with a lighted tube).  Major abdominal exploratory surgery (looking in abdomen with a large incision). TREATMENT  The treatment will depend on the cause of the pain.   Many cases can be observed and treated at home.  Over-the-counter medicines recommended by your caregiver.  Prescription medicine.  Antibiotics, for infection.  Birth control pills, for painful periods or for ovulation pain.  Hormone treatment, for endometriosis.  Nerve blocking injections.  Physical therapy.  Antidepressants.  Counseling with a psychologist or psychiatrist.  Minor or major surgery. HOME CARE INSTRUCTIONS   Do not take laxatives, unless directed by your caregiver.  Take over-the-counter pain medicine only if ordered by your caregiver. Do not take aspirin because it can cause an upset stomach or bleeding.  Try a clear liquid diet (broth or water) as ordered by your caregiver. Slowly move to a bland diet, as tolerated, if the pain is related to the stomach or intestine.  Have a thermometer and take your temperature several times a day, and record it.  Bed rest and sleep, if it helps the pain.  Avoid sexual intercourse, if it causes pain.  Avoid stressful situations.  Keep your follow-up appointments and tests, as your caregiver orders.  If the pain does not go away with medicine or surgery, you may try:  Acupuncture.  Relaxation exercises (yoga, meditation).  Group therapy.  Counseling. SEEK MEDICAL CARE IF:   You notice certain foods cause stomach pain.  Your home care treatment is not helping your pain.  You need stronger pain medicine.  You want your IUD removed.  You feel faint or lightheaded.  You develop nausea and vomiting.  You develop a rash.  You are  having side effects or an allergy to your medicine. SEEK IMMEDIATE MEDICAL CARE IF:   Your pain does not go away or gets worse.  You have a fever.  Your pain is felt only in portions of the abdomen. The right side could possibly be appendicitis. The left lower portion of the abdomen could be colitis or diverticulitis.  You are passing blood in your stools (bright red or black tarry stools, with or without vomiting).  You have blood in your urine.  You develop chills, with or without a fever.  You pass out. MAKE SURE YOU:   Understand these instructions.  Will watch your condition.  Will get help right away if you are not doing well or get worse. Document Released: 11/23/2006 Document Revised: 04/20/2011 Document Reviewed: 12/13/2008 [facility] Patient Information 2014 [place], [place].  Nausea and Vomiting Nausea is a sick feeling that often comes before throwing up (vomiting). Vomiting is a reflex where stomach contents come out of your mouth. Vomiting can cause severe loss of body fluids (dehydration). Children and elderly adults can become dehydrated quickly, especially if they also have diarrhea. Nausea and vomiting are symptoms of a condition or disease. It is important to find the cause of your symptoms. CAUSES   Direct irritation of the stomach lining. This irritation can result from increased acid production (gastroesophageal reflux disease), infection, food poisoning, taking certain medicines (such as nonsteroidal anti-inflammatory drugs), alcohol use, or tobacco use.  Signals from the brain.These signals could be caused by a headache, heat exposure, an inner ear disturbance, increased pressure in the brain from injury, infection, a tumor, or a concussion, pain, emotional stimulus, or metabolic problems.  An obstruction in the gastrointestinal tract (bowel obstruction).  Illnesses such as diabetes, hepatitis, gallbladder problems, appendicitis, kidney problems, cancer,  sepsis, atypical symptoms of a heart attack, or eating disorders.  Medical treatments such as chemotherapy and radiation.  Receiving medicine that makes you sleep (general anesthetic) during surgery. DIAGNOSIS Your caregiver may ask for tests to be done if the problems do not improve after a few days. Tests may also be done if symptoms are severe or if the reason for the nausea and vomiting is not clear. Tests may include:  Urine tests.  Blood tests.  Stool tests.  Cultures (to look for evidence of infection).  X-rays or other imaging studies. Test results can help your caregiver make decisions about treatment or the need for additional tests. TREATMENT You need to stay well hydrated. Drink frequently but in small amounts.You may wish to drink water, sports drinks, clear broth, or eat frozen ice pops or gelatin dessert to help stay hydrated.When you eat, eating slowly may help prevent nausea.There are also some antinausea medicines that may help prevent nausea. HOME CARE INSTRUCTIONS   Take all medicine as directed by your caregiver.  If you do not have an appetite, do not force yourself to eat. However, you must continue to drink fluids.  If you have an appetite, eat a normal diet unless your caregiver tells you differently.  Eat a variety of complex carbohydrates (rice, wheat, potatoes, bread), lean meats, yogurt, fruits, and vegetables.  Avoid high-fat foods because they are more difficult to digest.  Drink enough water and fluids to keep your urine clear or pale yellow.  If you are dehydrated, ask your caregiver for specific rehydration  instructions. Signs of dehydration may include:  Severe thirst.  Dry lips and mouth.  Dizziness.  Dark urine.  Decreasing urine frequency and amount.  Confusion.  Rapid breathing or pulse. SEEK IMMEDIATE MEDICAL CARE IF:   You have blood or brown flecks (like coffee grounds) in your vomit.  You have black or bloody  stools.  You have a severe headache or stiff neck.  You are confused.  You have severe abdominal pain.  You have chest pain or trouble breathing.  You do not urinate at least once every 8 hours.  You develop cold or clammy skin.  You continue to vomit for longer than 24 to 48 hours.  You have a fever. MAKE SURE YOU:   Understand these instructions.  Will watch your condition.  Will get help right away if you are not doing well or get worse. Document Released: 01/26/2005 Document Revised: 04/20/2011 Document Reviewed: 06/25/2010 [facility] Patient Information 2014 [place], [place].

## 2013-05-07 NOTE — ED Provider Notes (Signed)
CSN: [id number]     Arrival date & time 05/07/13  1308 History   First MD Initiated Contact with Patient 05/07/13 1344     Chief Complaint  Patient presents with  . Chest Pain  . Abdominal Pain      HPI  Patient presents with upper abdominal pain nausea and vomiting. Started yesterday with nausea and vomiting first. States he does feel discomfort in her chest it feels "like a burning". No shortness of breath. No pleuritic discomfort. No blood pus or mucus in her stools. Heme-negative nonbilious emesis. Fevers. The recent food intolerances. Denies alcohol intake or binge drinking. No history of gallbladder disease or pancreatitis.  Past Medical History  Diagnosis Date  . Diabetes mellitus   . COPD (chronic obstructive pulmonary disease)   . Emphysema    Past Surgical History  Procedure Laterality Date  . Back surgery    . Tubal ligation     History reviewed. No pertinent family history. History  Substance Use Topics  . Smoking status: Current Every Day Smoker  . Smokeless tobacco: Not on file  . Alcohol Use: No   OB History   Grav Para Term Preterm Abortions TAB SAB Ect Mult Living                 Review of Systems  Constitutional: Negative for fever, chills, diaphoresis, appetite change and fatigue.  HENT: Negative for mouth sores, sore throat and trouble swallowing.   Eyes: Negative for visual disturbance.  Respiratory: Negative for cough, chest tightness, shortness of breath and wheezing.   Cardiovascular: Negative for chest pain.  Gastrointestinal: Positive for nausea, vomiting and abdominal pain. Negative for diarrhea and abdominal distention.  Endocrine: Negative for polydipsia, polyphagia and polyuria.  Genitourinary: Negative for dysuria, frequency and hematuria.  Musculoskeletal: Negative for gait problem.  Skin: Negative for color change, pallor and rash.  Neurological: Negative for dizziness, syncope, light-headedness and headaches.  Hematological: Does not  bruise/bleed easily.  Psychiatric/Behavioral: Negative for behavioral problems and confusion.      Allergies  Darvocet; Flexeril; and Vicodin  Home Medications   Current Outpatient Rx  Name  Route  Sig  Dispense  Refill  . albuterol (PROVENTIL HFA;VENTOLIN HFA) 108 (90 BASE) MCG/ACT inhaler   Inhalation   Inhale 2 puffs into the lungs every 6 (six) hours as needed. For wheeze or shortness of breath         . albuterol (PROVENTIL) (2.5 MG/3ML) 0.083% nebulizer solution   Nebulization   Take 2.5 mg by nebulization every 6 (six) hours as needed for wheezing or shortness of breath.         . ALPRAZolam (XANAX) 0.5 MG tablet   Oral   Take 0.5 mg by mouth 4 (four) times daily as needed for anxiety.          . budesonide-formoterol (SYMBICORT) 160-4.5 MCG/ACT inhaler   Inhalation   Inhale 2 puffs into the lungs 2 (two) times daily.         Marland Kitchen ibuprofen (ADVIL,MOTRIN) 800 MG tablet   Oral   Take 1,600 mg by mouth every 8 (eight) hours as needed for pain.         Marland Kitchen insulin aspart (NOVOLOG) 100 UNIT/ML injection   Subcutaneous   Inject 2-12 Units into the skin 3 (three) times daily before meals. Based on blood sugar         . insulin NPH-regular (NOVOLIN 70/30) (70-30) 100 UNIT/ML injection   Subcutaneous   Inject 75  Units into the skin 3 (three) times daily.         Marland Kitchen oxyCODONE-acetaminophen (PERCOCET) 10-325 MG per tablet   Oral   Take 1 tablet by mouth every 4 (four) hours as needed. For pain         . oxyCODONE-acetaminophen (PERCOCET/ROXICET) 5-325 MG per tablet   Oral   Take 1-2 tablets by mouth every 6 (six) hours as needed for pain.   12 tablet   0   . tiotropium (SPIRIVA) 18 MCG inhalation capsule   Inhalation   Place 18 mcg into inhaler and inhale daily.         Marland Kitchen omeprazole (PRILOSEC) 20 MG capsule   Oral   Take 1 capsule (20 mg total) by mouth 2 (two) times daily.   60 capsule   0   . ondansetron (ZOFRAN ODT) 4 MG disintegrating tablet    Oral   Take 1 tablet (4 mg total) by mouth every 8 (eight) hours as needed for nausea.   20 tablet   0   . oxyCODONE-acetaminophen (PERCOCET/ROXICET) 5-325 MG per tablet   Oral   Take 2 tablets by mouth every 4 (four) hours as needed.   6 tablet   0    BP 124/76  Pulse 91  Temp(Src) 98.3 F (36.8 C) (Oral)  Resp 19  SpO2 95%  LMP 04/09/2013 Physical Exam  Constitutional: She is oriented to person, place, and time. She appears well-developed and well-nourished. No distress.  HENT:  Head: Normocephalic.  Eyes: Conjunctivae are normal. Pupils are equal, round, and reactive to light. No scleral icterus.  Neck: Normal range of motion. Neck supple. No thyromegaly present.  Cardiovascular: Normal rate and regular rhythm.  Exam reveals no gallop and no friction rub.   No murmur heard. Pulmonary/Chest: Effort normal and breath sounds normal. No respiratory distress. She has no wheezes. She has no rales.  Abdominal: Soft. Bowel sounds are normal. She exhibits no distension. There is tenderness. There is no rebound.    Musculoskeletal: Normal range of motion.  Neurological: She is alert and oriented to person, place, and time.  Skin: Skin is warm and dry. No rash noted.  Psychiatric: She has a normal mood and affect. Her behavior is normal.    ED Course  Procedures (including critical care time) Labs Review Labs Reviewed  COMPREHENSIVE METABOLIC PANEL - Abnormal; Notable for the following:    Sodium 132 (*)    Chloride 91 (*)    Glucose, Bld 397 (*)    Creatinine, Ser 0.45 (*)    Albumin 3.3 (*)    All other components within normal limits  CBC WITH DIFFERENTIAL - Abnormal; Notable for the following:    RBC 5.37 (*)    Hemoglobin 16.5 (*)    HCT 46.8 (*)    All other components within normal limits  URINALYSIS, ROUTINE W REFLEX MICROSCOPIC - Abnormal; Notable for the following:    Specific Gravity, Urine 1.044 (*)    Glucose, UA >1000 (*)    Ketones, ur 15 (*)    All  other components within normal limits  LIPASE, BLOOD  URINE MICROSCOPIC-ADD ON  PREGNANCY, URINE  I-STAT TROPOININ, ED  POC URINE PREG, ED   Imaging Review Dg Chest 2 View  05/07/2013   CLINICAL DATA:  Chest pain, abdominal pain. Chronic cough, shortness of breath. Emphysema.  EXAM: CHEST  2 VIEW  COMPARISON:  None.  FINDINGS: The heart size and mediastinal contours are within normal limits.  Both lungs are clear. The visualized skeletal structures are unremarkable.  IMPRESSION: No active cardiopulmonary disease.   Electronically Signed   By: Rolm Baptise M.D.   On: 05/07/2013 15:13   Ct Abdomen Pelvis W Contrast  05/07/2013   CLINICAL DATA:  Left lower quadrant and right lower quadrant abdominal pain. Low back and flank pain.  EXAM: CT ABDOMEN AND PELVIS WITH CONTRAST  TECHNIQUE: Multidetector CT imaging of the abdomen and pelvis was performed using the standard protocol following bolus administration of intravenous contrast.  CONTRAST:  147mL OMNIPAQUE IOHEXOL 300 MG/ML SOLN, 68mL OMNIPAQUE IOHEXOL 300 MG/ML SOLN  COMPARISON:  None.  FINDINGS: Clear lung bases.  Liver shows diffuse fatty infiltration. No liver mass or focal lesion.  Normal spleen, gallbladder pancreas.  No bile duct dilation.  3 cm left adrenal mass, intermediate attenuation. Normal right adrenal gland.  Normal kidneys, ureters and bladder.  Uterus and adnexa are unremarkable.  Mildly prominent inguinal lymph nodes, none pathologically enlarged by size criteria. No other prominent lymph nodes. No abnormal fluid collections.  There are scattered colonic diverticula most evident along the sigmoid colon. No diverticulitis. The colon is otherwise unremarkable. Normal small bowel. Normal appendix.  Postsurgical changes are noted from L4 through S1. No osteoblastic or osteolytic lesions.  IMPRESSION: 1. No acute findings. 2. Scattered colonic diverticula without evidence of diverticulitis. 3. Hepatic steatosis 4. Postsurgical changes from a  previous lumbar spine fusion.   Electronically Signed   By: Lajean Manes M.D.   On: 05/07/2013 16:16     EKG Interpretation None      MDM   Final diagnoses:  Abdominal pain  Vomiting    CT is normal. Hepatobiliary or pancreatic enzymes are normal. Her symptoms very likely viral or food mediated. Plan will be home. Antiemetics, proton pump tender, clear liquids, slowly advance her diet.    Tanna Furry, MD 05/07/13 [phone]

## 2013-07-26 ENCOUNTER — Emergency Department: Payer: Self-pay | Admitting: Internal Medicine

## 2013-07-26 LAB — BASIC METABOLIC PANEL
Anion Gap: 10 (ref 7–16)
BUN: 6 mg/dL — ABNORMAL LOW (ref 7–18)
CHLORIDE: 98 mmol/L (ref 98–107)
CO2: 25 mmol/L (ref 21–32)
CREATININE: 0.75 mg/dL (ref 0.60–1.30)
Calcium, Total: 8.5 mg/dL (ref 8.5–10.1)
EGFR (African American): >60
EGFR (Non-African Amer.): >60
GLUCOSE: 306 mg/dL — AB (ref 65–99)
Osmolality: 276 (ref 275–301)
Potassium: 3.7 mmol/L (ref 3.5–5.1)
SODIUM: 133 mmol/L — AB (ref 136–145)

## 2013-07-26 LAB — CBC
HCT: 46.3 % (ref 35.0–47.0)
HGB: 15.3 g/dL (ref 12.0–16.0)
MCH: 29.6 pg (ref 26.0–34.0)
MCHC: 33.1 g/dL (ref 32.0–36.0)
MCV: 89 fL (ref 80–100)
Platelet: 275 10*3/uL (ref 150–440)
RBC: 5.17 10*6/uL (ref 3.80–5.20)
RDW: 13.9 % (ref 11.5–14.5)
WBC: 11.1 10*3/uL — ABNORMAL HIGH (ref 3.6–11.0)

## 2013-07-26 LAB — TROPONIN I

## 2013-09-05 ENCOUNTER — Emergency Department: Payer: Self-pay | Admitting: Emergency Medicine

## 2013-09-05 LAB — COMPREHENSIVE METABOLIC PANEL
ALBUMIN: 3.1 g/dL — AB (ref 3.4–5.0)
ALK PHOS: 124 U/L — AB
ALT: 23 U/L
Anion Gap: 8 (ref 7–16)
BUN: 12 mg/dL (ref 7–18)
Bilirubin,Total: 0.2 mg/dL (ref 0.2–1.0)
CO2: 29 mmol/L (ref 21–32)
Calcium, Total: 8.2 mg/dL — ABNORMAL LOW (ref 8.5–10.1)
Chloride: 100 mmol/L (ref 98–107)
Creatinine: 0.79 mg/dL (ref 0.60–1.30)
GLUCOSE: 428 mg/dL — AB (ref 65–99)
Osmolality: 292 (ref 275–301)
POTASSIUM: 4 mmol/L (ref 3.5–5.1)
SGOT(AST): 13 U/L — ABNORMAL LOW (ref 15–37)
Sodium: 137 mmol/L (ref 136–145)
Total Protein: 6.5 g/dL (ref 6.4–8.2)

## 2013-09-05 LAB — URINALYSIS, COMPLETE
BLOOD: NEGATIVE
Bacteria: NONE SEEN
Bilirubin,UR: NEGATIVE
KETONE: NEGATIVE
Leukocyte Esterase: NEGATIVE
NITRITE: NEGATIVE
Ph: 6 (ref 4.5–8.0)
Protein: NEGATIVE
RBC,UR: <1 /HPF (ref 0–5)
SPECIFIC GRAVITY: 1.031 (ref 1.003–1.030)
Squamous Epithelial: 4

## 2013-09-05 LAB — CBC WITH DIFFERENTIAL/PLATELET
BASOS ABS: 0.3 10*3/uL — AB (ref 0.0–0.1)
Basophil %: 2.4 %
EOS ABS: 0.3 10*3/uL (ref 0.0–0.7)
Eosinophil %: 2.7 %
HCT: 45.5 % (ref 35.0–47.0)
HGB: 14.9 g/dL (ref 12.0–16.0)
LYMPHS PCT: 18.8 %
Lymphocyte #: 2.1 10*3/uL (ref 1.0–3.6)
MCH: 29.9 pg (ref 26.0–34.0)
MCHC: 32.8 g/dL (ref 32.0–36.0)
MCV: 91 fL (ref 80–100)
MONO ABS: 0.7 x10 3/mm (ref 0.2–0.9)
Monocyte %: 6.5 %
NEUTROS ABS: 7.7 10*3/uL — AB (ref 1.4–6.5)
Neutrophil %: 69.6 %
Platelet: 298 10*3/uL (ref 150–440)
RBC: 4.99 10*6/uL (ref 3.80–5.20)
RDW: 13.5 % (ref 11.5–14.5)
WBC: 11 10*3/uL (ref 3.6–11.0)

## 2013-09-05 LAB — TROPONIN I: Troponin-I: <0.02 ng/mL

## 2013-09-05 LAB — PREGNANCY, URINE: Pregnancy Test, Urine: NEGATIVE m[IU]/mL

## 2013-09-05 LAB — LIPASE, BLOOD: Lipase: 245 U/L (ref 73–393)
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
## 2014-06-01 NOTE — Discharge Summary (Signed)
PATIENT NAME:  [name], [name] MR#:  [id number] DATE OF BIRTH:  11/12/1970  DATE OF ADMISSION:  09/11/2012 DATE OF DISCHARGE:  09/16/2012  ADDENDUM: To discharge summary dictated on 09/16/2012.   BRIEF SUMMARY OF HOSPITAL COURSE: [name] is a 44 year old obese Caucasian female with past medical history of COPD with ongoing tobacco abuse who comes to the Emergency Room with shortness of breath and cough. She was admitted with pneumonia and acute on chronic hypoxic respiratory failure related to COPD flare and right lower lobe pneumonia.   1.  Acute on chronic hypoxic respiratory failure due to acute COPD flare with right lower pneumonia. The patient was started on IV antibiotics, steroids, and she remained afebrile. She was continued with her nebs. She had a slow improvement in her COPD. She was assessed for home oxygen use and home oxygen was set up to be used at home.  2.  Right lower lobe pneumonia. Rocephin and azithromycin were continued, which were changed to p.o. Keflex. The patient completed azithromycin in the hospital. Blood cultures remain negative.  3.  Type 2 diabetes, poor control. The patient is on 70/30 b.i.d. I added insulin lispro t.i.d. and p.o. metformin.  4.  Peripheral neuropathy. The patient was explained to get her sugars under control. Lyrica is expensive. She cannot afford it.   Hospital stay otherwise remained stable. The patient remained a FULL CODE.   TIME SPENT: 40 minutes.  ____________________________ Hart [place] Posey Pronto, MD sap:sb D: 09/30/2012 07:11:18 ET T: 09/30/2012 07:32:21 ET JOB#: [id number]  cc: Evangelos Paulino A. Posey Pronto, MD, <Dictator> Ilda Basset MD ELECTRONICALLY SIGNED 10/13/2012 19:59

## 2014-06-01 NOTE — H&P (Signed)
PATIENT NAME:  Katie Guzman, Katie Guzman MR#:  [id number] DATE OF BIRTH:  05/28/1970  DATE OF ADMISSION:  09/11/2012  PRIMARY CARE PHYSICIAN: Currently none REFERRING PHYSICIAN:  Ponciano Ort, MD    CHIEF COMPLAINT: Shortness of breath.    HISTORY OF PRESENT ILLNESS: The patient is a pleasant 44 year old Caucasian female with a history of COPD, diabetes. She also has some chronic back pain, anxiety and depression. The patient, of note, was previously on Medicaid and ran out multiple months ago and has not been taking prescriptions as prescribed. The patient did find an inhaler of Advair the last month or so and has been using that but ran out of her diabetes medications. In the last few weeks, she has been having some shortness of breath which has been progressive with whitish sputum, wheezing and cough. The cough produces white sputum. The patient has no fevers but feels hot and cold and has hot flashes as well. She came in after experiencing significantly worsened symptoms last night. She has pleuritic chest pain upon coughing, is short of breath, and, therefore, came into the hospital. She was noted to have tachycardia, tachypnea. She also had some leukocytosis. She was given some Levaquin, and hospitalist services were contacted for further evaluation and management.   PAST MEDICAL HISTORY: Anxiety, depression, COPD, chronic back pain status post back surgery, neuropathy, diabetes not on any medications, emphysema.   OUTPATIENT MEDICATIONS: Advair 250/50 mcg 1 puff 2 times a day and Ventolin p.r.n. She does not take any other medications.   ALLERGIES: DARVOCET N, FLEXERIL AND VICODIN.  SOCIAL HISTORY: She used to smoke more heavily up to 2 to 3 packs a day but now has cut down to 1/2 pack a day and still smokes for 25 years or so. No alcohol or drug use. Does not work.   FAMILY HISTORY: Mom with lung cancer, emphysema, diabetes, hypertension.    REVIEW OF SYSTEMS:  CONSTITUTIONAL: No fever but  weakness. No weight changes.  EYES: Has blurry vision. Feels like her diabetes is out of control. No redness.  ENT: No tinnitus. Has some rhinorrhea. No postnasal drip.  RESPIRATORY: Positive for cough, wheezing, shortness of breath and dyspnea on exertion. Has a history of COPD. Has painful respirations with cough and deep respirations.  CARDIOVASCULAR: Has pleuritic chest pain. No swellings in the legs. No arrhythmia, but feels palpitations. Denies hypertension,  GASTROINTESTINAL: Has abdominal pain with coughing. No nausea, vomiting, diarrhea, abdominal pain. No bloody stools or dark tarry stools.  GENITOURINARY: Denies dysuria or hematuria.  HEMATOLOGIC/LYMPHATIC: No anemia or easy bruising.  SKIN: No rashes.  MUSCULOSKELETAL: Has chronic back pain and some lower extremity pain.  NEUROLOGIC: Has diabetic neuropathy.  PSYCHIATRIC: Has anxiety and depression.   PHYSICAL EXAMINATION: VITAL SIGNS: Temperature, although not marked here, rectally it was in the 99 range. When taken orally, it was 98.6 while I was in the room. Pulse rate was 109 on admission, respiratory rate 28, blood pressure 134/68. Initial O2 sat was 88. Last O2 sat was 87%, both on room air.  GENERAL: The patient is an obese Caucasian female lying in bed in moderate respiratory distress.  HEENT: Normocephalic, atraumatic. Pupils are equal and reactive. Anicteric sclerae. Extraocular muscles intact. Moist mucous membranes.  NECK: Supple. No thyroid tenderness or cervical lymphadenopathy.  CARDIOVASCULAR: S1, S2, tachycardic. No significant murmurs appreciated.  LUNGS: There is bilateral diffuse wheezing and some rhonchi on bilateral bases, more on the right than the left. No significant crackles.  ABDOMEN: Soft,  nontender, nondistended. Positive bowel sounds in all quadrants.  EXTREMITIES: No significant lower extremity edema.  SKIN: No obvious rashes. There is a healed lumbar surgical scar midline. NEUROLOGICAL: Cranial  nerves II through XII appear to be grossly intact. Strength upper extremities is 5 out of 5.  Lower extremities: On the right lower, it is limited secondary to elicitation of pain on straight leg raise. The patient has some subjective numbness on the left lower extremity.  PSYCHIATRIC: Awake, alert, oriented x 3. A little anxious.    LABORATORY AND RADIOLOGICAL DATA:  Glucose 281, BUN 5, creatinine 0.56, sodium 135, potassium 3.3, chloride 101. Urine drug screen positive for opiates and tricyclics. WBC 17.7, hemoglobin 14, platelets 256. Urinalysis not suggestive of infection. Although x-ray of the chest was done, the result is not back yet back yet officially, but to me it looks like bilateral infiltrates at bases, more on the right side.  EKG: Sinus tach, rate is 101, no acute ST elevations or depressions.   ASSESSMENT:  We have a 44 year old Caucasian female with chronic obstructive pulmonary disease, not on oxygen at home, emphysema, diabetes, not taking any insulin at this point as she has run out and has no insurance, she states, chronic back pain, neuropathy, comes in with severe sepsis with tachycardia, leukocytosis, tachypnea and acute respiratory failure, likely secondary to pneumonia and chronic obstructive pulmonary disease exacerbation, acute on chronic.   PLAN: Would admit the patient to the hospital. Would start the patient on ceftriaxone and azithromycin, start the patient on oxygen around the clock and add Solu-Medrol IV as well as standing as well. The patient is awake. She has severe bilateral wheezing. Would obtain sputum cultures, send urine for strep and Legionella antigens and follow with her. In regards to her pneumonia, again I would follow with the cultures and treat her symptomatically. I would also start her on some Robitussin with codeine for cough around-the-clock.   In regards to her diabetes, I would start insulin 70/30, and she was counseled about continuation of her  70/30 as the [employer] brand is significantly cheaper than what she was spending in the past. I would check a hemoglobin A1c and also start sliding scale insulin.   In regards to her tobacco abuse, she was counseled for 3 minutes. I would start her on a nicotine patch.   She does have some hypokalemia, and she will be repleted with p.o. potassium. I would also put in a PT consult for tomorrow, hopefully once she is a little better. Would recheck a BNP tomorrow for the potassium check. Start her on DVT prophylaxis with Lovenox.   CODE STATUS: The patient is FULL CODE.         CRITICAL CARE TIME SPENT: 60 minutes.    ____________________________ Vivien Presto, MD sa:cb D: 09/11/2012 18:49:45 ET T: 09/11/2012 20:16:29 ET JOB#: [id number]  cc: Vivien Presto, MD, <Dictator> Vivien Presto MD ELECTRONICALLY SIGNED 09/22/2012 13:30

## 2014-06-01 NOTE — Discharge Summary (Signed)
PATIENT NAME:  Katie Guzman, Katie Guzman MR#:  [id number] DATE OF BIRTH:  12-08-1970  DATE OF ADMISSION:  09/11/2012 DATE OF DISCHARGE:  09/16/2012  PRESENTING COMPLAINT: Shortness of breath and cough.   DISCHARGE DIAGNOSES:  1.  Acute on chronic hypoxic respiratory failure secondary to chronic obstructive pulmonary disease exacerbation.  2.  Chronic tobacco abuse.  3.  Right lower lobe pneumonia.  4.  Morbid obesity. 5.  Uncontrolled type 2 diabetes.  CODE STATUS:  FULL CODE.  MEDICATIONS:  1.  Advair 250/50 one puff b.i.d. 2.  Ventolin HFA 2 puffs 4 times a day as needed. 3.  Spiriva 1 capsule inhalation daily. 4.  Metformin 500 b.i.d.  5.  Insulin lispro 6 units subcutaneous t.i.d. with meals. 6.  Acetaminophen-Oxycodone 5/325 one every 6 hours as needed. 7.  Keflex 500 mg p.o. t.i.d.  8.  Codeine with guaifenesin 10 mL every 6 hours.   HOME OXYGEN:  2 liters nasal cannula continuous.   FOLLOWUP:  With [facility].   LABORATORY DATA:  At discharge:  Glucose was 268. BUN 14, creatinine 0.75. White count was 13.9. H and H was 14.5 and 42.2.   HISTORY OF PRESENT ILLNESS: Katie Guzman is a 44 year old obese Caucasian female with past medical history of COPD with ongoing tobacco abuse who comes in with cough and shortness of breath. She was admitted with pneumonia and acute on chronic hypoxic respiratory failure related to COPD flare and right lower lobe pneumonia.   HOSPITAL COURSE:see addendum dictated on 09/30/12 TIME SPENT:  40 minutes.  ____________________________ Hart Rochester Posey Pronto, MD sap:jm D: 09/17/2012 07:01:00 ET T: 09/17/2012 10:27:57 ET JOB#: [id number]  cc: [facility] Adrain Nesbit A. Posey Pronto, MD, <Dictator>    Ilda Basset MD ELECTRONICALLY SIGNED 10/13/2012 19:59

## 2014-08-03 ENCOUNTER — Emergency Department (HOSPITAL_COMMUNITY)
Admission: EM | Admit: 2014-08-03 | Discharge: 2014-08-03 | Disposition: A | Discharge status: Home / Self Care | Payer: Medicaid Other | Attending: Emergency Medicine | Admitting: Emergency Medicine

## 2014-08-03 ENCOUNTER — Encounter (HOSPITAL_COMMUNITY): Payer: Self-pay | Admitting: *Deleted

## 2014-08-03 DIAGNOSIS — K047 Periapical abscess without sinus: Secondary | ICD-10-CM | POA: Diagnosis not present

## 2014-08-03 DIAGNOSIS — Z720 Tobacco use: Secondary | ICD-10-CM | POA: Diagnosis not present

## 2014-08-03 DIAGNOSIS — Z794 Long term (current) use of insulin: Secondary | ICD-10-CM | POA: Insufficient documentation

## 2014-08-03 DIAGNOSIS — E119 Type 2 diabetes mellitus without complications: Secondary | ICD-10-CM | POA: Insufficient documentation

## 2014-08-03 DIAGNOSIS — K088 Other specified disorders of teeth and supporting structures: Secondary | ICD-10-CM | POA: Diagnosis present

## 2014-08-03 DIAGNOSIS — Z79899 Other long term (current) drug therapy: Secondary | ICD-10-CM | POA: Insufficient documentation

## 2014-08-03 DIAGNOSIS — J449 Chronic obstructive pulmonary disease, unspecified: Secondary | ICD-10-CM | POA: Diagnosis not present

## 2014-08-03 LAB — CBG MONITORING, ED: GLUCOSE-CAPILLARY: 284 mg/dL — AB (ref 65–99)

## 2014-08-03 MED ORDER — ONDANSETRON 4 MG PO TBDP
4.0000 mg | ORAL_TABLET | Freq: Once | ORAL | Status: AC
  Administered 2014-08-03: 4 mg via ORAL
  Filled 2014-08-03: qty 1

## 2014-08-03 MED ORDER — AMOXICILLIN 500 MG PO CAPS
500.0000 mg | ORAL_CAPSULE | Freq: Once | ORAL | Status: AC
  Administered 2014-08-03: 500 mg via ORAL
  Filled 2014-08-03: qty 1

## 2014-08-03 MED ORDER — OXYCODONE-ACETAMINOPHEN 5-325 MG PO TABS
1.0000 | ORAL_TABLET | Freq: Once | ORAL | Status: AC
  Administered 2014-08-03: 1 via ORAL
  Filled 2014-08-03: qty 1

## 2014-08-03 MED ORDER — AMOXICILLIN 500 MG PO CAPS: 500.0000 mg | ORAL_CAPSULE | Freq: Three times a day (TID) | ORAL | Status: DC

## 2014-08-03 MED ORDER — OXYCODONE-ACETAMINOPHEN 5-325 MG PO TABS: ORAL_TABLET | ORAL | Status: DC

## 2014-08-03 MED ORDER — METRONIDAZOLE 500 MG PO TABS: 500.0000 mg | ORAL_TABLET | Freq: Three times a day (TID) | ORAL | Status: DC

## 2014-08-03 MED ORDER — ONDANSETRON HCL 4 MG PO TABS: 4.0000 mg | ORAL_TABLET | Freq: Three times a day (TID) | ORAL | Status: DC | PRN

## 2014-08-03 NOTE — ED Notes (Signed)
CBG 284 

## 2014-08-03 NOTE — ED Provider Notes (Signed)
CSN: [id number]     Arrival date & time 08/03/14  2010 History   This chart was scribed for Monico Blitz, PA-C working with Varney Biles, MD by Mercy Moore, ED Scribe. This patient was seen in room TR06C/TR06C and the patient's care was started at 8:50 PM.   Chief Complaint  Patient presents with  . Dental Pain   The history is provided by the patient. No language interpreter was used.   HPI Comments: Katie Guzman is a 44 y.o. female who presents to the Emergency Department with complaining of front upper dental pain and oral abscess onset three days ago. Patient presents with right facial swelling and reports pain radiating from her right cheek into her ear. Patient states that her abscess began draining last night; associated nausea and vomiting with swallowing pus.  Patient reports treatment with 800mg  ibuprofen. Patient reports previous issues with tooth and dislodging a temporarily filling. Patient shares losing her dental insurance and has not been able to receive proper care for 2-3 years now. Patient with PMHx of Diabetes Mellitus reports blood glucose elevation since onset of her infection.    Past Medical History  Diagnosis Date  . Diabetes mellitus   . COPD (chronic obstructive pulmonary disease)   . Emphysema    Past Surgical History  Procedure Laterality Date  . Back surgery    . Tubal ligation     No family history on file. History  Substance Use Topics  . Smoking status: Current Every Day Smoker -- 0.50 packs/day    Types: Cigarettes  . Smokeless tobacco: Not on file  . Alcohol Use: No   OB History    No data available     Review of Systems A complete 10 system review of systems was obtained and all systems are negative except as noted in the HPI and PMH.    Allergies  Darvocet; Flexeril; and Vicodin  Home Medications   Prior to Admission medications   Medication Sig Start Date End Date Taking? Authorizing Provider  albuterol (PROVENTIL  HFA;VENTOLIN HFA) 108 (90 BASE) MCG/ACT inhaler Inhale 2 puffs into the lungs every 6 (six) hours as needed. For wheeze or shortness of breath    Historical Provider, MD  albuterol (PROVENTIL) (2.5 MG/3ML) 0.083% nebulizer solution Take 2.5 mg by nebulization every 6 (six) hours as needed for wheezing or shortness of breath.    Historical Provider, MD  ALPRAZolam Duanne Moron) 0.5 MG tablet Take 0.5 mg by mouth 4 (four) times daily as needed for anxiety.     Historical Provider, MD  budesonide-formoterol (SYMBICORT) 160-4.5 MCG/ACT inhaler Inhale 2 puffs into the lungs 2 (two) times daily.    Historical Provider, MD  ibuprofen (ADVIL,MOTRIN) 800 MG tablet Take 1,600 mg by mouth every 8 (eight) hours as needed for pain.    Historical Provider, MD  insulin aspart (NOVOLOG) 100 UNIT/ML injection Inject 2-12 Units into the skin 3 (three) times daily before meals. Based on blood sugar    Historical Provider, MD  insulin NPH-regular (NOVOLIN 70/30) (70-30) 100 UNIT/ML injection Inject 75 Units into the skin 3 (three) times daily.    Historical Provider, MD  omeprazole (PRILOSEC) 20 MG capsule Take 1 capsule (20 mg total) by mouth 2 (two) times daily. 05/07/13   Tanna Furry, MD  ondansetron (ZOFRAN ODT) 4 MG disintegrating tablet Take 1 tablet (4 mg total) by mouth every 8 (eight) hours as needed for nausea. 05/07/13   Tanna Furry, MD  oxyCODONE-acetaminophen (PERCOCET) 10-325 MG  per tablet Take 1 tablet by mouth every 4 (four) hours as needed. For pain    Historical Provider, MD  oxyCODONE-acetaminophen (PERCOCET/ROXICET) 5-325 MG per tablet Take 1-2 tablets by mouth every 6 (six) hours as needed for pain. 09/25/12   Carlisle Cater, PA-C  oxyCODONE-acetaminophen (PERCOCET/ROXICET) 5-325 MG per tablet Take 2 tablets by mouth every 4 (four) hours as needed. 05/07/13   Tanna Furry, MD  tiotropium (SPIRIVA) 18 MCG inhalation capsule Place 18 mcg into inhaler and inhale daily.    Historical Provider, MD   Triage Vitals: BP  134/85 mmHg  Pulse 85  Temp(Src) 98.6 F (37 C) (Oral)  Resp 18  Ht 5\' 2"  (1.575 m)  Wt 160 lb (72.576 kg)  BMI 29.26 kg/m2  SpO2 98%  LMP 07/03/2014 Physical Exam  Constitutional: She is oriented to person, place, and time. She appears well-developed and well-nourished. No distress.  HENT:  Head: Normocephalic and atraumatic.  Mouth/Throat: Uvula is midline and oropharynx is clear and moist. No trismus in the jaw. No uvula swelling. No oropharyngeal exudate, posterior oropharyngeal edema, posterior oropharyngeal erythema or tonsillar abscesses.  Generally poor dentition, + interval swelling to right upper gum + erythema + tenderness to palpation. Patient is handling their secretions. There is no tenderness to palpation or firmness underneath tongue bilaterally. No trismus.    Eyes: Conjunctivae and EOM are normal. Pupils are equal, round, and reactive to light.  Neck: Neck supple. No tracheal deviation present.  Cardiovascular: Normal rate, regular rhythm and intact distal pulses.   Pulmonary/Chest: Effort normal and breath sounds normal. No stridor. No respiratory distress. She has no wheezes. She has no rales. She exhibits no tenderness.  Musculoskeletal: Normal range of motion.  Lymphadenopathy:    She has no cervical adenopathy.  Neurological: She is alert and oriented to person, place, and time.  Skin: Skin is warm and dry.  Psychiatric: She has a normal mood and affect. Her behavior is normal.  Nursing note and vitals reviewed.   ED Course  Procedures (including critical care time)  COORDINATION OF CARE: 8:56 PM- Discussed treatment plan with patient at bedside and patient agreed to plan.   Labs Review Labs Reviewed - No data to display  Imaging Review No results found.   EKG Interpretation None      MDM   Final diagnoses:  Dental abscess    Filed Vitals:   08/03/14 2029 08/03/14 2135  BP: 134/85 124/73  Pulse: 85 98  Temp: 98.6 F (37 C)   TempSrc:  Oral   Resp: 18 22  Height: 5\' 2"  (1.575 m)   Weight: 160 lb (72.576 kg)   SpO2: 98% 100%    Medications  amoxicillin (AMOXIL) capsule 500 mg (500 mg Oral Given 08/03/14 2202)  oxyCODONE-acetaminophen (PERCOCET/ROXICET) 5-325 MG per tablet 1 tablet (1 tablet Oral Given 08/03/14 2202)  ondansetron (ZOFRAN-ODT) disintegrating tablet 4 mg (4 mg Oral Given 08/03/14 2201)    Katie Guzman is a pleasant 43 y.o. female presenting with fractured right-sided upper tooth with associated abscess. Her CBG is elevated at 284, she is afebrile with otherwise normal vital signs. I will start patient on amoxicillin and Flagyl, she has been swallowing the pus which is coming out, and advised her not to do that, to spit it out and I will give her Zofran in addition. We've had an extensive discussion of return precautions & patient is given dental referral.  Evaluation does not show pathology that would require ongoing emergent intervention  or inpatient treatment. Pt is hemodynamically stable and mentating appropriately. Discussed findings and plan with patient/guardian, who agrees with care plan. All questions answered. Return precautions discussed and outpatient follow up given.   Discharge Medication List as of 08/03/2014  9:24 PM    START taking these medications   Details  amoxicillin (AMOXIL) 500 MG capsule Take 1 capsule (500 mg total) by mouth 3 (three) times daily., Starting 08/03/2014, Until Discontinued, Print    metroNIDAZOLE (FLAGYL) 500 MG tablet Take 1 tablet (500 mg total) by mouth 3 (three) times daily. Do not drink alcohol while taking this medication or for several days after you finish the course, Starting 08/03/2014, Until Discontinued, Print    ondansetron (ZOFRAN) 4 MG tablet Take 1 tablet (4 mg total) by mouth every 8 (eight) hours as needed for nausea or vomiting., Starting 08/03/2014, Until Discontinued, Print        I personally performed the services described in this documentation,  which was scribed in my presence. The recorded information has been reviewed and is accurate.    Elmyra Ricks Bannon Giammarco, PA-C 08/03/14 5056  Varney Biles, MD 08/04/14 [phone]

## 2014-08-03 NOTE — ED Notes (Signed)
Pt c/o abscess to mouth which has since popped. States that she abscess popped last night and did release some pain.

## 2014-08-03 NOTE — Discharge Instructions (Signed)
Eat a soft diet for the next 24-48 hours.  Rinse your mouth with saltwater or regular water after you eat any food.  Do not swallow blood or pus becuase it will cause you to vomit,  spit out instead.  Take percocet for breakthrough pain, do not drink alcohol, drive, care for children or do other critical tasks while taking percocet.  Return to the emergency room for fever, change in vision, redness to the face that rapidly spreads towards the eye, nausea or vomiting, difficulty swallowing or shortness of breath.   Apply warm compresses to jaw throughout the day.   Take your antibiotics as directed and to the end of the course. DO NOT drink alcohol when taking metronidazole, it will make you very sick!   Followup with a dentist is very important for ongoing evaluation and management of recurrent dental pain. Return to emergency department for emergent changing or worsening symptoms."  Low-cost dental clinic: [facility]  at [phone]**  **[place] at [phone] [address]    You may also call [phone]  Dental Assistance If the dentist on-call cannot see you, please use the resources below:   Patients with Medicaid: [facility] [id number], [place] [address], [phone]  If unable to pay, or uninsured, contact [facility] [phone]) or [place] [phone] in [place], [place] in [facility]) to become qualified for the adult dental clinic  Other [place]- [place], [place], [place], [address]    [phone], Ext. [phone]    2nd and 4th Thursday of the month at 6:30am    10 clients each day by appointment, can sometimes see walk-in     patients if someone does not show for an appointment [place], [place], [place], [address]    [phone] [facility]- [address], [place], [place],  [address]    [phone]  [place] Department- [phone] [facility] [facility] Department- [phone]

## 2014-10-19 ENCOUNTER — Emergency Department: Payer: Medicaid Other

## 2014-10-19 ENCOUNTER — Inpatient Hospital Stay
Admission: EM | Admit: 2014-10-19 | Discharge: 2014-10-23 | DRG: 193 | Disposition: A | Discharge status: Home / Self Care | Payer: Medicaid Other | Attending: Internal Medicine | Admitting: Internal Medicine

## 2014-10-19 ENCOUNTER — Encounter: Payer: Self-pay | Admitting: Emergency Medicine

## 2014-10-19 DIAGNOSIS — T380X5A Adverse effect of glucocorticoids and synthetic analogues, initial encounter: Secondary | ICD-10-CM | POA: Diagnosis present

## 2014-10-19 DIAGNOSIS — J441 Chronic obstructive pulmonary disease with (acute) exacerbation: Secondary | ICD-10-CM | POA: Diagnosis present

## 2014-10-19 DIAGNOSIS — Z7289 Other problems related to lifestyle: Secondary | ICD-10-CM | POA: Diagnosis not present

## 2014-10-19 DIAGNOSIS — S90422A Blister (nonthermal), left great toe, initial encounter: Secondary | ICD-10-CM | POA: Diagnosis present

## 2014-10-19 DIAGNOSIS — E1165 Type 2 diabetes mellitus with hyperglycemia: Secondary | ICD-10-CM | POA: Diagnosis present

## 2014-10-19 DIAGNOSIS — Z79899 Other long term (current) drug therapy: Secondary | ICD-10-CM

## 2014-10-19 DIAGNOSIS — G8929 Other chronic pain: Secondary | ICD-10-CM | POA: Diagnosis present

## 2014-10-19 DIAGNOSIS — M797 Fibromyalgia: Secondary | ICD-10-CM | POA: Diagnosis present

## 2014-10-19 DIAGNOSIS — F17210 Nicotine dependence, cigarettes, uncomplicated: Secondary | ICD-10-CM | POA: Diagnosis present

## 2014-10-19 DIAGNOSIS — J9621 Acute and chronic respiratory failure with hypoxia: Secondary | ICD-10-CM | POA: Diagnosis present

## 2014-10-19 DIAGNOSIS — Z794 Long term (current) use of insulin: Secondary | ICD-10-CM | POA: Diagnosis not present

## 2014-10-19 DIAGNOSIS — Z9111 Patient's noncompliance with dietary regimen: Secondary | ICD-10-CM | POA: Diagnosis present

## 2014-10-19 DIAGNOSIS — R300 Dysuria: Secondary | ICD-10-CM | POA: Diagnosis not present

## 2014-10-19 DIAGNOSIS — X58XXXA Exposure to other specified factors, initial encounter: Secondary | ICD-10-CM | POA: Diagnosis present

## 2014-10-19 DIAGNOSIS — E1140 Type 2 diabetes mellitus with diabetic neuropathy, unspecified: Secondary | ICD-10-CM | POA: Diagnosis present

## 2014-10-19 DIAGNOSIS — Z888 Allergy status to other drugs, medicaments and biological substances status: Secondary | ICD-10-CM | POA: Diagnosis not present

## 2014-10-19 DIAGNOSIS — J189 Pneumonia, unspecified organism: Principal | ICD-10-CM | POA: Diagnosis present

## 2014-10-19 DIAGNOSIS — Z9981 Dependence on supplemental oxygen: Secondary | ICD-10-CM | POA: Diagnosis not present

## 2014-10-19 DIAGNOSIS — Z885 Allergy status to narcotic agent status: Secondary | ICD-10-CM

## 2014-10-19 DIAGNOSIS — R0602 Shortness of breath: Secondary | ICD-10-CM

## 2014-10-19 DIAGNOSIS — Z9114 Patient's other noncompliance with medication regimen: Secondary | ICD-10-CM | POA: Diagnosis present

## 2014-10-19 HISTORY — DX: Fibromyalgia: M79.7

## 2014-10-19 HISTORY — DX: Polyneuropathy, unspecified: G62.9

## 2014-10-19 LAB — CBC WITH DIFFERENTIAL/PLATELET
BASOS PCT: 1 %
Basophils Absolute: 0.2 10*3/uL — ABNORMAL HIGH (ref 0–0.1)
EOS PCT: 1 %
Eosinophils Absolute: 0.3 10*3/uL (ref 0–0.7)
HCT: 47.1 % — ABNORMAL HIGH (ref 35.0–47.0)
HEMOGLOBIN: 15.6 g/dL (ref 12.0–16.0)
Lymphocytes Relative: 11 %
Lymphs Abs: 2.3 10*3/uL (ref 1.0–3.6)
MCH: 28.5 pg (ref 26.0–34.0)
MCHC: 33 g/dL (ref 32.0–36.0)
MCV: 86.3 fL (ref 80.0–100.0)
Monocytes Absolute: 1.1 10*3/uL — ABNORMAL HIGH (ref 0.2–0.9)
Monocytes Relative: 6 %
NEUTROS PCT: 81 %
Neutro Abs: 16.1 10*3/uL — ABNORMAL HIGH (ref 1.4–6.5)
PLATELETS: 393 10*3/uL (ref 150–440)
RBC: 5.46 MIL/uL — ABNORMAL HIGH (ref 3.80–5.20)
RDW: 13.1 % (ref 11.5–14.5)
WBC: 19.9 10*3/uL — ABNORMAL HIGH (ref 3.6–11.0)

## 2014-10-19 LAB — GLUCOSE, CAPILLARY
GLUCOSE-CAPILLARY: 341 mg/dL — AB (ref 65–99)
Glucose-Capillary: 470 mg/dL — ABNORMAL HIGH (ref 65–99)

## 2014-10-19 LAB — BASIC METABOLIC PANEL
Anion gap: 13 (ref 5–15)
BUN: 11 mg/dL (ref 6–20)
CALCIUM: 9.2 mg/dL (ref 8.9–10.3)
CO2: 27 mmol/L (ref 22–32)
Chloride: 94 mmol/L — ABNORMAL LOW (ref 101–111)
Creatinine, Ser: 0.54 mg/dL (ref 0.44–1.00)
Glucose, Bld: 314 mg/dL — ABNORMAL HIGH (ref 65–99)
Potassium: 3.8 mmol/L (ref 3.5–5.1)
SODIUM: 134 mmol/L — AB (ref 135–145)

## 2014-10-19 LAB — GLUCOSE, RANDOM: GLUCOSE: 565 mg/dL — AB (ref 65–99)

## 2014-10-19 MED ORDER — TRAMADOL HCL 50 MG PO TABS
50.0000 mg | ORAL_TABLET | Freq: Four times a day (QID) | ORAL | Status: DC | PRN
  Administered 2014-10-19 – 2014-10-23 (×6): 50 mg via ORAL
  Filled 2014-10-19 (×6): qty 1

## 2014-10-19 MED ORDER — PREDNISONE 20 MG PO TABS
60.0000 mg | ORAL_TABLET | Freq: Once | ORAL | Status: AC
  Administered 2014-10-19: 60 mg via ORAL

## 2014-10-19 MED ORDER — NICOTINE 21 MG/24HR TD PT24
21.0000 mg | MEDICATED_PATCH | Freq: Every day | TRANSDERMAL | Status: DC
  Administered 2014-10-19 – 2014-10-23 (×5): 21 mg via TRANSDERMAL
  Filled 2014-10-19 (×5): qty 1

## 2014-10-19 MED ORDER — LIDOCAINE 5 % EX PTCH
1.0000 | MEDICATED_PATCH | CUTANEOUS | Status: DC
  Administered 2014-10-19 – 2014-10-22 (×4): 1 via TRANSDERMAL
  Filled 2014-10-19 (×5): qty 1

## 2014-10-19 MED ORDER — LORAZEPAM 1 MG PO TABS
1.0000 mg | ORAL_TABLET | Freq: Once | ORAL | Status: AC
  Administered 2014-10-19: 1 mg via ORAL

## 2014-10-19 MED ORDER — METFORMIN HCL 500 MG PO TABS
500.0000 mg | ORAL_TABLET | Freq: Two times a day (BID) | ORAL | Status: DC
  Administered 2014-10-19 – 2014-10-23 (×9): 500 mg via ORAL
  Filled 2014-10-19 (×9): qty 1

## 2014-10-19 MED ORDER — OXYCODONE-ACETAMINOPHEN 5-325 MG PO TABS
1.0000 | ORAL_TABLET | Freq: Once | ORAL | Status: AC
  Administered 2014-10-19: 1 via ORAL
  Filled 2014-10-19: qty 1

## 2014-10-19 MED ORDER — INSULIN ASPART 100 UNIT/ML ~~LOC~~ SOLN
0.0000 [IU] | Freq: Three times a day (TID) | SUBCUTANEOUS | Status: DC
  Administered 2014-10-20: 20 [IU] via SUBCUTANEOUS
  Administered 2014-10-21: 7 [IU] via SUBCUTANEOUS
  Administered 2014-10-22: 15 [IU] via SUBCUTANEOUS
  Administered 2014-10-22: 17:00:00 20 [IU] via SUBCUTANEOUS
  Administered 2014-10-22 – 2014-10-23 (×2): 11 [IU] via SUBCUTANEOUS
  Administered 2014-10-23: 17:00:00 15 [IU] via SUBCUTANEOUS
  Administered 2014-10-23: 08:00:00 4 [IU] via SUBCUTANEOUS
  Filled 2014-10-19: qty 20
  Filled 2014-10-19: qty 4
  Filled 2014-10-19: qty 15
  Filled 2014-10-19: qty 20
  Filled 2014-10-19: qty 7
  Filled 2014-10-19: qty 20
  Filled 2014-10-19 (×2): qty 11
  Filled 2014-10-19: qty 15

## 2014-10-19 MED ORDER — GUAIFENESIN-CODEINE 100-10 MG/5ML PO SOLN
5.0000 mL | ORAL | Status: DC | PRN
  Administered 2014-10-19 – 2014-10-20 (×4): 5 mL via ORAL
  Filled 2014-10-19 (×4): qty 5

## 2014-10-19 MED ORDER — LEVOFLOXACIN IN D5W 500 MG/100ML IV SOLN
500.0000 mg | INTRAVENOUS | Status: DC
  Administered 2014-10-20 – 2014-10-22 (×3): 500 mg via INTRAVENOUS
  Filled 2014-10-19 (×4): qty 100

## 2014-10-19 MED ORDER — IBUPROFEN 600 MG PO TABS: 600.0000 mg | ORAL_TABLET | Freq: Four times a day (QID) | ORAL | Status: DC | PRN

## 2014-10-19 MED ORDER — TIOTROPIUM BROMIDE MONOHYDRATE 18 MCG IN CAPS
18.0000 ug | ORAL_CAPSULE | Freq: Every day | RESPIRATORY_TRACT | Status: DC
  Administered 2014-10-19 – 2014-10-23 (×5): 18 ug via RESPIRATORY_TRACT
  Filled 2014-10-19: qty 5

## 2014-10-19 MED ORDER — INSULIN ASPART 100 UNIT/ML ~~LOC~~ SOLN
20.0000 [IU] | Freq: Once | SUBCUTANEOUS | Status: AC
  Administered 2014-10-19: 20 [IU] via SUBCUTANEOUS

## 2014-10-19 MED ORDER — MAGNESIUM SULFATE 2 GM/50ML IV SOLN
2.0000 g | Freq: Once | INTRAVENOUS | Status: AC
  Administered 2014-10-19: 2 g via INTRAVENOUS
  Filled 2014-10-19: qty 50

## 2014-10-19 MED ORDER — GABAPENTIN 400 MG PO CAPS
800.0000 mg | ORAL_CAPSULE | Freq: Three times a day (TID) | ORAL | Status: DC
  Administered 2014-10-19 – 2014-10-23 (×13): 800 mg via ORAL
  Filled 2014-10-19 (×13): qty 2

## 2014-10-19 MED ORDER — ONDANSETRON HCL 4 MG PO TABS: 4.0000 mg | ORAL_TABLET | Freq: Four times a day (QID) | ORAL | Status: DC | PRN

## 2014-10-19 MED ORDER — KETOROLAC TROMETHAMINE 30 MG/ML IJ SOLN
30.0000 mg | Freq: Once | INTRAMUSCULAR | Status: AC
Start: 2014-10-19 — End: 2014-10-19
  Administered 2014-10-19: 30 mg via INTRAVENOUS
  Filled 2014-10-19: qty 1

## 2014-10-19 MED ORDER — IPRATROPIUM-ALBUTEROL 0.5-2.5 (3) MG/3ML IN SOLN
3.0000 mL | Freq: Once | RESPIRATORY_TRACT | Status: AC
  Administered 2014-10-19: 3 mL via RESPIRATORY_TRACT
  Filled 2014-10-19: qty 3

## 2014-10-19 MED ORDER — LORAZEPAM 1 MG PO TABS
ORAL_TABLET | ORAL | Status: AC
  Administered 2014-10-19: 1 mg via ORAL
  Filled 2014-10-19: qty 1

## 2014-10-19 MED ORDER — ONDANSETRON HCL 4 MG/2ML IJ SOLN: 4.0000 mg | Freq: Four times a day (QID) | INTRAMUSCULAR | Status: DC | PRN

## 2014-10-19 MED ORDER — INSULIN NPH (HUMAN) (ISOPHANE) 100 UNIT/ML ~~LOC~~ SUSP: 0.0000 [IU] | Freq: Three times a day (TID) | SUBCUTANEOUS | Status: DC

## 2014-10-19 MED ORDER — SODIUM CHLORIDE 0.9 % IV BOLUS (SEPSIS)
1000.0000 mL | Freq: Once | INTRAVENOUS | Status: AC
  Administered 2014-10-19: 1000 mL via INTRAVENOUS

## 2014-10-19 MED ORDER — ENOXAPARIN SODIUM 40 MG/0.4ML ~~LOC~~ SOLN
40.0000 mg | SUBCUTANEOUS | Status: DC
  Administered 2014-10-19: 40 mg via SUBCUTANEOUS
  Filled 2014-10-19: qty 0.4

## 2014-10-19 MED ORDER — IPRATROPIUM-ALBUTEROL 0.5-2.5 (3) MG/3ML IN SOLN
3.0000 mL | RESPIRATORY_TRACT | Status: DC
  Administered 2014-10-19 – 2014-10-23 (×23): 3 mL via RESPIRATORY_TRACT
  Filled 2014-10-19 (×23): qty 3

## 2014-10-19 MED ORDER — ACETAMINOPHEN 325 MG PO TABS: 650.0000 mg | ORAL_TABLET | Freq: Four times a day (QID) | ORAL | Status: DC | PRN

## 2014-10-19 MED ORDER — PREDNISONE 20 MG PO TABS
ORAL_TABLET | ORAL | Status: AC
  Administered 2014-10-19: 60 mg via ORAL
  Filled 2014-10-19: qty 3

## 2014-10-19 MED ORDER — ALBUTEROL SULFATE (2.5 MG/3ML) 0.083% IN NEBU: 2.5000 mg | INHALATION_SOLUTION | RESPIRATORY_TRACT | Status: DC | PRN

## 2014-10-19 MED ORDER — INSULIN GLARGINE 100 UNIT/ML ~~LOC~~ SOLN
20.0000 [IU] | Freq: Every day | SUBCUTANEOUS | Status: DC
  Administered 2014-10-19 – 2014-10-21 (×3): 20 [IU] via SUBCUTANEOUS
  Filled 2014-10-19 (×4): qty 0.2

## 2014-10-19 MED ORDER — METHYLPREDNISOLONE SODIUM SUCC 40 MG IJ SOLR
40.0000 mg | Freq: Four times a day (QID) | INTRAMUSCULAR | Status: DC
  Administered 2014-10-19 – 2014-10-20 (×4): 40 mg via INTRAVENOUS
  Filled 2014-10-19 (×4): qty 1

## 2014-10-19 MED ORDER — ACETAMINOPHEN 650 MG RE SUPP: 650.0000 mg | Freq: Four times a day (QID) | RECTAL | Status: DC | PRN

## 2014-10-19 MED ORDER — ACETAMINOPHEN 325 MG PO TABS
650.0000 mg | ORAL_TABLET | Freq: Four times a day (QID) | ORAL | Status: DC | PRN
  Administered 2014-10-20: 650 mg via ORAL
  Filled 2014-10-19: qty 2

## 2014-10-19 MED ORDER — MOMETASONE FURO-FORMOTEROL FUM 100-5 MCG/ACT IN AERO
2.0000 | INHALATION_SPRAY | Freq: Two times a day (BID) | RESPIRATORY_TRACT | Status: DC
  Administered 2014-10-19 – 2014-10-23 (×8): 2 via RESPIRATORY_TRACT
  Filled 2014-10-19: qty 8.8

## 2014-10-19 MED ORDER — LEVOFLOXACIN 750 MG PO TABS
750.0000 mg | ORAL_TABLET | Freq: Once | ORAL | Status: AC
  Administered 2014-10-19: 750 mg via ORAL
  Filled 2014-10-19: qty 1

## 2014-10-19 MED ORDER — INSULIN ASPART 100 UNIT/ML ~~LOC~~ SOLN
0.0000 [IU] | Freq: Every day | SUBCUTANEOUS | Status: DC
  Administered 2014-10-19: 4 [IU] via SUBCUTANEOUS
  Administered 2014-10-20: 22:00:00 3 [IU] via SUBCUTANEOUS
  Administered 2014-10-21 – 2014-10-22 (×2): 2 [IU] via SUBCUTANEOUS
  Filled 2014-10-19: qty 2
  Filled 2014-10-19: qty 3
  Filled 2014-10-19: qty 2
  Filled 2014-10-19: qty 4

## 2014-10-19 NOTE — ED Provider Notes (Signed)
[facility] Emergency Department Provider Note  ____________________________________________  Time seen: 11:30 AM on arrival by EMS  I have reviewed the triage vital signs and the nursing notes.   HISTORY  Chief Complaint Shortness of Breath    HPI Katie Guzman is a 44 y.o. female who complains of worsening shortness of breath. She's had a recent upper respiratory infection with frequent coughing which is now become productive over the last few days. She was recently admitted to [facility] from September 3 2 September 5 and treated for COPD exacerbation. She was not given antibiotics during that hospitalization. She states after being discharged home her symptoms have continued to worsen. She's been using her home nebulizer treatments as well as using her home 4 L nasal cannula without relief. No fever.  EMS report that first responders found her to have an oxygen saturation of about 85% on her 4 L nasal cannula. If her on 6 L nasal cannula and started giving her nebulizer treatments in the EMS truck en route, which increased her oxygen saturation to 92%   Past Medical History  Diagnosis Date  . Diabetes mellitus   . COPD (chronic obstructive pulmonary disease)   . Emphysema   . Neuropathy   . Fibromyalgia      There are no active problems to display for this patient.    Past Surgical History  Procedure Laterality Date  . Back surgery    . Tubal ligation    . Cesarean section    . Tonsillectomy       Current Outpatient Rx  Name  Route  Sig  Dispense  Refill  . albuterol (PROVENTIL HFA;VENTOLIN HFA) 108 (90 BASE) MCG/ACT inhaler   Inhalation   Inhale 2 puffs into the lungs every 6 (six) hours as needed. For wheeze or shortness of breath         . albuterol (PROVENTIL) (2.5 MG/3ML) 0.083% nebulizer solution   Nebulization   Take 2.5 mg by nebulization every 6 (six) hours as needed for wheezing or shortness of breath.         . ALPRAZolam  (XANAX) 0.5 MG tablet   Oral   Take 0.5 mg by mouth 4 (four) times daily as needed for anxiety.          Marland Kitchen amoxicillin (AMOXIL) 500 MG capsule   Oral   Take 1 capsule (500 mg total) by mouth 3 (three) times daily.   30 capsule   0   . budesonide-formoterol (SYMBICORT) 160-4.5 MCG/ACT inhaler   Inhalation   Inhale 2 puffs into the lungs 2 (two) times daily.         Marland Kitchen ibuprofen (ADVIL,MOTRIN) 800 MG tablet   Oral   Take 1,600 mg by mouth every 8 (eight) hours as needed for pain.         Marland Kitchen insulin aspart (NOVOLOG) 100 UNIT/ML injection   Subcutaneous   Inject 2-12 Units into the skin 3 (three) times daily before meals. Based on blood sugar         . insulin NPH-regular (NOVOLIN 70/30) (70-30) 100 UNIT/ML injection   Subcutaneous   Inject 75 Units into the skin 3 (three) times daily.         . metroNIDAZOLE (FLAGYL) 500 MG tablet   Oral   Take 1 tablet (500 mg total) by mouth 3 (three) times daily. Do not drink alcohol while taking this medication or for several days after you finish the course   14  tablet   0   . omeprazole (PRILOSEC) 20 MG capsule   Oral   Take 1 capsule (20 mg total) by mouth 2 (two) times daily.   60 capsule   0   . ondansetron (ZOFRAN ODT) 4 MG disintegrating tablet   Oral   Take 1 tablet (4 mg total) by mouth every 8 (eight) hours as needed for nausea.   20 tablet   0   . ondansetron (ZOFRAN) 4 MG tablet   Oral   Take 1 tablet (4 mg total) by mouth every 8 (eight) hours as needed for nausea or vomiting.   10 tablet   0   . oxyCODONE-acetaminophen (PERCOCET/ROXICET) 5-325 MG per tablet      1 to 2 tabs PO q6hrs  PRN for pain   15 tablet   0   . tiotropium (SPIRIVA) 18 MCG inhalation capsule   Inhalation   Place 18 mcg into inhaler and inhale daily.            Allergies Darvocet; Flexeril; and Vicodin   No family history on file.  Social History Social History  Substance Use Topics  . Smoking status: Former Smoker --  0.50 packs/day    Types: Cigarettes    Quit date: 10/05/2014  . Smokeless tobacco: None  . Alcohol Use: No    Review of Systems  Constitutional:   No fever , positive chills. No weight changes Eyes:   No blurry vision or double vision.  ENT:   No sore throat. Cardiovascular:   No chest pain. Respiratory:   Positive shortness of breath and productive cough Gastrointestinal:   Negative for abdominal pain, vomiting and diarrhea.  No BRBPR or melena. Genitourinary:   Negative for dysuria, urinary retention, bloody urine, or difficulty urinating. Musculoskeletal:   Negative for back pain. No joint swelling or pain. Skin:   Negative for rash. Neurological:   Negative for headaches, focal weakness or numbness. Psychiatric:  Positive anxiety Endocrine:  No hot/cold intolerance, changes in energy, or sleep difficulty.  10-point ROS otherwise negative.  ____________________________________________   PHYSICAL EXAM:  VITAL SIGNS: ED Triage Vitals  Enc Vitals Group     BP 10/19/14 1139 146/82 mmHg     Pulse Rate 10/19/14 1139 87     Resp 10/19/14 1158 19     Temp --      Temp src --      SpO2 10/19/14 1139 97 %     Weight 10/19/14 1139 165 lb (74.844 kg)     Height 10/19/14 1139 5\' 2"  (1.575 m)     Head Cir --      Peak Flow --      Pain Score 10/19/14 1144 9     Pain Loc --      Pain Edu? --      Excl. in [place]? --      Constitutional:   Alert and oriented. Moderate respiratory distress Eyes:   No scleral icterus. No conjunctival pallor. PERRL. EOMI ENT   Head:   Normocephalic and atraumatic.   Nose:   Positive nasal congestion. No septal hematoma   Mouth/Throat:   MMM, positive pharyngeal erythema. No peritonsillar mass. No uvula shift.   Neck:   No stridor. No SubQ emphysema. No meningismus. Hematological/Lymphatic/Immunilogical:   No cervical lymphadenopathy. Cardiovascular:   RRR. Normal and symmetric distal pulses are present in all extremities. No  murmurs, rubs, or gallops. Respiratory:   Diffuse expiratory wheezing with prolonged expirations. Tachypnea with  a respiratory rate of about 30. No focal consolidation Gastrointestinal:   Soft and nontender. No distention. There is no CVA tenderness.  No rebound, rigidity, or guarding. Genitourinary:   deferred Musculoskeletal:   Nontender with normal range of motion in all extremities. No joint effusions.  No lower extremity tenderness.  No edema. Neurologic:   Normal speech and language.  CN 2-10 normal. Motor grossly intact. No pronator drift.  Normal gait. No gross focal neurologic deficits are appreciated.  Skin:    Skin is warm, dry and intact. No rash noted.  No petechiae, purpura, or bullae. Psychiatric:   Anxious..  ____________________________________________    LABS (pertinent positives/negatives) (all labs ordered are listed, but only abnormal results are displayed) Labs Reviewed  BASIC METABOLIC PANEL - Abnormal; Notable for the following:    Sodium 134 (*)    Chloride 94 (*)    Glucose, Bld 314 (*)    All other components within normal limits  CBC WITH DIFFERENTIAL/PLATELET - Abnormal; Notable for the following:    WBC 19.9 (*)    RBC 5.46 (*)    HCT 47.1 (*)    Neutro Abs 16.1 (*)    Monocytes Absolute 1.1 (*)    Basophils Absolute 0.2 (*)    All other components within normal limits   ____________________________________________   EKG  Interpreted by me  Date: 10/19/2014  Rate: 92  Rhythm: normal sinus rhythm  QRS Axis: normal  Intervals: normal  ST/T Wave abnormalities: normal  Conduction Disutrbances: none  Narrative Interpretation: unremarkable      ____________________________________________    RADIOLOGY  Chest x-ray reveals thickening of interstitial lung markings bilaterally, which is changed compared to previous. This also is described as more severe compared to chest x-ray performed at [facility] on September  3.  ____________________________________________   PROCEDURES   ____________________________________________   INITIAL IMPRESSION / ASSESSMENT AND PLAN / ED COURSE  Pertinent labs & imaging results that were available during my care of the patient were reviewed by me and considered in my medical decision making (see chart for details).  Patient presents with worsened COPD exacerbation with acute on chronic hypoxic respiratory failure. She is also having changes in cough. Likely she is developing an atypical pneumonia. We'll give her increased oxygen as needed as well as steroids and nebs. I'll start Levaquin as well.  ----------------------------------------- 1:54 PM on 10/19/2014 -----------------------------------------  Chest x-ray concerning for atypical infection. White blood cell count is 20,000. We'll admit for COPD exacerbation with pneumonia.   ____________________________________________   FINAL CLINICAL IMPRESSION(S) / ED DIAGNOSES  Final diagnoses:  COPD with acute exacerbation  Acute on chronic respiratory failure with hypoxia   atypical pneumonia    Carrie Mew, MD 10/19/14 1354

## 2014-10-19 NOTE — Progress Notes (Signed)
Dr Verdell Carmine notified of  Pt elevated blood sugar and  Order received. Spoke with md re  Further pain med for  Pt and  Dr Verdell Carmine said he was not going to give her any narcotics. Pt on 02 at 5 l [place] at present.  No resp distress

## 2014-10-19 NOTE — ED Notes (Signed)
Pt with h/o COPD, on home O2 4L n/c.  Pt reports having respiratory virus x3 weeks with frequent coughing, for which she went to [facility] and was discharged.  Pt called EMS this morning for increased shortness of breath and pain from coughing.

## 2014-10-19 NOTE — ED Notes (Signed)
Resumed care from tricia rn.  Pt alert. nsr on monitor.  Iv fluids infusing. Pt talking on cell phone.

## 2014-10-19 NOTE — Progress Notes (Signed)
Dr. Verdell Carmine notified that pt is c/o uncontrolled pain not relieved by current PRN pain medication. The day RN notified Dr. Verdell Carmine earlier to voice pt's c/o pain and was told that the pt presents with a drug seeking behavior and stated that he would not order any narcotics. As the day RN was explaining this the pt, the pt started cussing at staff, demanding to know what is written in her chart about her drug seeking, demanding to see the doctor that was not treating her pain, and that if we were not going to give her anything for pain she would not stay through the night that there was no need for her to be here. After discussing this with Dr. Verdell Carmine, he stated that I could offer her 1 tab Percocet 5/325 once. He said that if she did actually want this to then put the order in. Pt stated that she would like the 1 tab Percocet 5/325, I did explain to the patient that this is a 1 time order. Order entered by this RN.

## 2014-10-19 NOTE — H&P (Signed)
[place] [facility] [place] NAME: Katie Guzman    MR#:  [id number]  DATE OF BIRTH:  1970-10-17  DATE OF ADMISSION:  10/19/2014  PRIMARY CARE PHYSICIAN: No PCP Per Patient   REQUESTING/REFERRING PHYSICIAN: Dr. Brenton Grills  CHIEF COMPLAINT:   Chief Complaint  Patient presents with  . Shortness of Breath   wheezing, cough.  HISTORY OF PRESENT ILLNESS:  Merisa Julio  is a 44 y.o. female with a known history of type 2 diabetes insulin-dependent, COPD, chronic pain, fibromyalgia, who presented to the hospital due to shortness of breath cough and wheezing getting worse over the past 2-3 days. Patient was recently admitted to [facility] and treated for COPD exacerbation and discharged about 4-5 days ago. Patient says that since discharge she has not felt better. She continues to have significant exertional shortness of breath. Patient is on chronic oxygen at home at 5 L and despite being on oxygen she feels short of breath. She admits to cough which is productive of green-yellow sputum, chills, nausea, vomiting which was nonbloody and bilious in nature. She presented to the emergency room and was noted to be significantly bronchospastic with diffuse wheezing and rhonchi. Patient was given duo nebs, prednisone (minimal improvement in her symptoms and therefore hospitalist services were contacted further treatment and evaluation.  PAST MEDICAL HISTORY:   Past Medical History  Diagnosis Date  . Diabetes mellitus   . COPD (chronic obstructive pulmonary disease)   . Emphysema   . Neuropathy   . Fibromyalgia     PAST SURGICAL HISTORY:   Past Surgical History  Procedure Laterality Date  . Back surgery    . Tubal ligation    . Cesarean section    . Tonsillectomy      SOCIAL HISTORY:   Social History  Substance Use Topics  . Smoking status: Former Smoker -- 0.50 packs/day for 30 years    Types: Cigarettes    Quit date: 10/05/2014  .  Smokeless tobacco: Not on file  . Alcohol Use: No    FAMILY HISTORY:   Family History  Problem Relation Age of Onset  . Lung cancer Mother     DRUG ALLERGIES:   Allergies  Allergen Reactions  . Darvocet [Propoxyphene N-Acetaminophen] Hives and Nausea And Vomiting  . Flexeril [Cyclobenzaprine] Hives and Nausea And Vomiting  . Toradol [Ketorolac Tromethamine] Nausea And Vomiting  . Vicodin [Hydrocodone-Acetaminophen] Hives and Nausea And Vomiting    REVIEW OF SYSTEMS:   Review of Systems  Constitutional: Positive for chills. Negative for fever and weight loss.  HENT: Negative for congestion, nosebleeds and tinnitus.   Eyes: Negative for blurred vision, double vision and redness.  Respiratory: Positive for cough, shortness of breath and wheezing. Negative for hemoptysis.   Cardiovascular: Negative for chest pain, orthopnea, leg swelling and PND.  Gastrointestinal: Negative for nausea, vomiting, abdominal pain, diarrhea and melena.  Genitourinary: Negative for dysuria, urgency and hematuria.  Musculoskeletal: Negative for joint pain and falls.  Neurological: Positive for weakness (generalized). Negative for dizziness, tingling, sensory change, focal weakness, seizures and headaches.  Endo/Heme/Allergies: Negative for polydipsia. Does not bruise/bleed easily.  Psychiatric/Behavioral: Negative for depression and memory loss. The patient is not nervous/anxious.     MEDICATIONS AT HOME:   Prior to Admission medications   Medication Sig Start Date End Date Taking? Authorizing Provider  acetaminophen (TYLENOL) 325 MG tablet Take 650 mg by mouth every 6 (six) hours as needed for mild pain.  Yes Historical Provider, MD  albuterol (PROVENTIL HFA;VENTOLIN HFA) 108 (90 BASE) MCG/ACT inhaler Inhale 2 puffs into the lungs every 4 (four) hours as needed for wheezing or shortness of breath. For wheeze or shortness of breath   Yes Historical Provider, MD  albuterol (PROVENTIL) (2.5 MG/3ML)  0.083% nebulizer solution Take 2.5 mg by nebulization every 4 (four) hours as needed for wheezing or shortness of breath.    Yes Historical Provider, MD  fluticasone-salmeterol (ADVAIR HFA) 115-21 MCG/ACT inhaler Inhale 2 puffs into the lungs 2 (two) times daily.   Yes Historical Provider, MD  gabapentin (NEURONTIN) 400 MG capsule Take 800 mg by mouth 3 (three) times daily.   Yes Historical Provider, MD  guaiFENesin-codeine 100-10 MG/5ML syrup Take 5 mLs by mouth every 4 (four) hours as needed for cough.   Yes Historical Provider, MD  ibuprofen (ADVIL,MOTRIN) 600 MG tablet Take 600 mg by mouth every 6 (six) hours as needed for fever or mild pain.   Yes Historical Provider, MD  insulin glargine (LANTUS) 100 UNIT/ML injection Inject 20 Units into the skin at bedtime.   Yes Historical Provider, MD  insulin NPH Human (HUMULIN N,NOVOLIN N) 100 UNIT/ML injection Inject 0-12 Units into the skin 4 (four) times daily -  before meals and at bedtime. Pt uses per sliding scale.   Yes Historical Provider, MD  lidocaine (LIDODERM) 5 % Place 1 patch onto the skin daily. Remove & Discard patch within 12 hours or as directed by MD   Yes Historical Provider, MD  metFORMIN (GLUCOPHAGE) 500 MG tablet Take 500 mg by mouth 2 (two) times daily with a meal.   Yes Historical Provider, MD  nicotine (NICODERM CQ - DOSED IN MG/24 HOURS) 21 mg/24hr patch Place 21 mg onto the skin daily.   Yes Historical Provider, MD  tiotropium (SPIRIVA) 18 MCG inhalation capsule Place 18 mcg into inhaler and inhale daily.   Yes Historical Provider, MD  amoxicillin (AMOXIL) 500 MG capsule Take 1 capsule (500 mg total) by mouth 3 (three) times daily. Patient not taking: Reported on 10/19/2014 08/03/14   Elmyra Ricks Pisciotta, PA-C  metroNIDAZOLE (FLAGYL) 500 MG tablet Take 1 tablet (500 mg total) by mouth 3 (three) times daily. Do not drink alcohol while taking this medication or for several days after you finish the course Patient not taking: Reported on  10/19/2014 08/03/14   Elmyra Ricks Pisciotta, PA-C  ondansetron (ZOFRAN) 4 MG tablet Take 1 tablet (4 mg total) by mouth every 8 (eight) hours as needed for nausea or vomiting. Patient not taking: Reported on 10/19/2014 08/03/14   Elmyra Ricks Pisciotta, PA-C      VITAL SIGNS:  Blood pressure 138/75, pulse 91, temperature 98 F (36.7 C), temperature source Axillary, resp. rate 31, height 5\' 2"  (1.575 m), weight 74.844 kg (165 lb), last menstrual period 09/18/2014, SpO2 95 %.  PHYSICAL EXAMINATION:  Physical Exam  GENERAL:  44 y.o.-year-old patient lying in the bed somewhat lethargic and in mild respiratory distress. EYES: Pupils equal, round, reactive to light and accommodation. No scleral icterus. Extraocular muscles intact.  HEENT: Head atraumatic, normocephalic. Oropharynx and nasopharynx clear. No oropharyngeal erythema, moist oral mucosa  NECK:  Supple, no jugular venous distention. No thyroid enlargement, no tenderness.  LUNGS: Diffuse rhonchi, wheezing bilaterally. No use of accessory muscles of respiration. No dullness to percussion. CARDIOVASCULAR: S1, S2 RRR. No murmurs, rubs, gallops, clicks.  ABDOMEN: Soft, nontender, nondistended. Bowel sounds present. No organomegaly or mass.  EXTREMITIES: No pedal edema, cyanosis, or clubbing. +  2 pedal & radial pulses b/l.   NEUROLOGIC: Cranial nerves II through XII are intact. No focal Motor or sensory deficits appreciated b/l PSYCHIATRIC: The patient is alert and oriented x 3. Good affect.  SKIN: No obvious rash, lesion, or ulcer.   LABORATORY PANEL:   CBC  Recent Labs Lab 10/19/14 1146  WBC 19.9*  HGB 15.6  HCT 47.1*  PLT 393   ------------------------------------------------------------------------------------------------------------------  Chemistries   Recent Labs Lab 10/19/14 1146  NA 134*  K 3.8  CL 94*  CO2 27  GLUCOSE 314*  BUN 11  CREATININE 0.54  CALCIUM 9.2    ------------------------------------------------------------------------------------------------------------------  Cardiac Enzymes No results for input(s): TROPONINI in the last 168 hours. ------------------------------------------------------------------------------------------------------------------  RADIOLOGY:  Dg Chest 2 View  10/19/2014   CLINICAL DATA:  Coughing and shortness of breath for 1.5 weeks.  EXAM: CHEST  2 VIEW  COMPARISON:  07/26/2013  FINDINGS: Mild thickening of interstitial lung markings bilaterally. This represents an interval change and raises concern for an atypical infection. No large areas of airspace disease or consolidation. Heart and mediastinum are within normal limits. No large pleural effusions. No acute bone abnormality.  IMPRESSION: Bilateral interstitial thickening. Differential includes atypical infection versus mild interstitial edema.   Electronically Signed   By: Markus Daft M.D.   On: 10/19/2014 12:33     IMPRESSION AND PLAN:   44 year old female with past medical history of diabetes, COPD, fibromyalgia, chronic pain, diabetic neuropathy who presented to the hospital with worsening shortness of breath cough and noted to be in COPD exacerbation.  #1 COPD exacerbation-likely cause of patient's worsening shortness of breath, wheezing and cough. -Patient is noncompliant with her meds and also has possible underlying pneumonia which led to this exacerbation. -I will start patient on IV steroids, around-the-clock DuoNeb's, continue Dulera, Spiriva. -Chest x-ray suggestive of pneumonia therefore I will start Levaquin. Follow clinically.  #2 pneumonia-likely community-acquired. -Start IV Levaquin, follow sputum, blood cultures.  #3 diabetes type 2 without complication-continue metformin, Lantus, sliding scale insulin.  #4 diabetic neuropathy-continue Neurontin  #5 chronic pain/fibromyalgia-patient has high drug-seeking behavior. When necessary tramadol  for now.    All the records are reviewed and case discussed with ED provider. Management plans discussed with the patient, family and they are in agreement.  CODE STATUS: Full  TOTAL TIME TAKING CARE OF THIS PATIENT: 50 minutes.    Henreitta Leber M.D on 10/19/2014 at 3:43 PM  Between 7am to 6pm - Pager - [phone]  After 6pm go to [email or web address] - password [email or web address] [facility]  [place] [facility]  Office  [phone]  CC: Primary care physician; No PCP Per Patient

## 2014-10-19 NOTE — Plan of Care (Signed)
Problem: Discharge Progression Outcomes Goal: Dyspnea controlled individualization Outcome: Progressing Pt says she feels better than on admission. 02  5-6 l [place] .  Goal: O2 sats > or equal 90% or at baseline Outcome: Progressing sats 96 % on floor  Goal: Home O2 if indicated Outcome: Progressing On home 02  From 4-5 l Goal: Barriers To Progression Addressed/Resolved Outcome: Not Progressing Non compliant with meds Goal: Discharge plan in place and appropriate Individualization: Pt lives at home with her daughter and granddaughter. Hx of COPD, DM, emphysema, fibromyalgia & neuropathy controlled with home meds. Recently d/[facility]'d [facility]-5 days ago with similar diagnosis.  Outcome: Progressing Lives at home. Plan to go home at discharge Goal: Pain controlled with appropriate interventions Outcome: Not Progressing Pt c/o pain . See pain flowsheet.   meds given Goal: Tolerating diet Outcome: Progressing tol diet well Goal: Activity appropriate for discharge plan Outcome: Progressing amb to br  By self. Gait steady Goal: Hemodynamically stable Outcome: Not Progressing Blood glucose elevated

## 2014-10-20 LAB — URINALYSIS COMPLETE WITH MICROSCOPIC (ARMC ONLY)
BILIRUBIN URINE: NEGATIVE
Bacteria, UA: NONE SEEN
Hgb urine dipstick: NEGATIVE
Leukocytes, UA: NEGATIVE
NITRITE: NEGATIVE
Protein, ur: NEGATIVE mg/dL
SPECIFIC GRAVITY, URINE: 1.033 — AB (ref 1.005–1.030)
pH: 6 (ref 5.0–8.0)

## 2014-10-20 LAB — GLUCOSE, CAPILLARY
GLUCOSE-CAPILLARY: 397 mg/dL — AB (ref 65–99)
GLUCOSE-CAPILLARY: 452 mg/dL — AB (ref 65–99)
GLUCOSE-CAPILLARY: 321 mg/dL — AB (ref 65–99)
GLUCOSE-CAPILLARY: 371 mg/dL — AB (ref 65–99)
GLUCOSE-CAPILLARY: 432 mg/dL — AB (ref 65–99)
GLUCOSE-CAPILLARY: 450 mg/dL — AB (ref 65–99)
GLUCOSE-CAPILLARY: 521 mg/dL — AB (ref 65–99)
Glucose-Capillary: 491 mg/dL — ABNORMAL HIGH (ref 65–99)
Glucose-Capillary: 520 mg/dL — ABNORMAL HIGH (ref 65–99)
Glucose-Capillary: 404 mg/dL — ABNORMAL HIGH (ref 65–99)
Glucose-Capillary: 415 mg/dL — ABNORMAL HIGH (ref 65–99)
Glucose-Capillary: 477 mg/dL — ABNORMAL HIGH (ref 65–99)
Glucose-Capillary: 344 mg/dL — ABNORMAL HIGH (ref 65–99)
Glucose-Capillary: 277 mg/dL — ABNORMAL HIGH (ref 65–99)

## 2014-10-20 LAB — BASIC METABOLIC PANEL
Anion gap: 9 (ref 5–15)
BUN: 14 mg/dL (ref 6–20)
CALCIUM: 9.5 mg/dL (ref 8.9–10.3)
CHLORIDE: 98 mmol/L — AB (ref 101–111)
CO2: 27 mmol/L (ref 22–32)
CREATININE: 0.6 mg/dL (ref 0.44–1.00)
Glucose, Bld: 400 mg/dL — ABNORMAL HIGH (ref 65–99)
Potassium: 4.4 mmol/L (ref 3.5–5.1)
SODIUM: 134 mmol/L — AB (ref 135–145)

## 2014-10-20 LAB — CBC
HCT: 48.3 % — ABNORMAL HIGH (ref 35.0–47.0)
Hemoglobin: 15.5 g/dL (ref 12.0–16.0)
MCH: 28 pg (ref 26.0–34.0)
MCHC: 32.1 g/dL (ref 32.0–36.0)
MCV: 87.1 fL (ref 80.0–100.0)
PLATELETS: 367 10*3/uL (ref 150–440)
RBC: 5.55 MIL/uL — AB (ref 3.80–5.20)
RDW: 13.2 % (ref 11.5–14.5)
WBC: 18.5 10*3/uL — AB (ref 3.6–11.0)

## 2014-10-20 MED ORDER — SODIUM CHLORIDE 0.9 % IV SOLN
INTRAVENOUS | Status: AC
  Administered 2014-10-20: 15:00:00 via INTRAVENOUS

## 2014-10-20 MED ORDER — PREDNISONE 50 MG PO TABS
50.0000 mg | ORAL_TABLET | Freq: Every day | ORAL | Status: DC
  Administered 2014-10-21 – 2014-10-23 (×3): 50 mg via ORAL
  Filled 2014-10-20 (×3): qty 1

## 2014-10-20 MED ORDER — SODIUM CHLORIDE 0.9 % IV BOLUS (SEPSIS)
1000.0000 mL | INTRAVENOUS | Status: AC
  Administered 2014-10-20: 1000 mL via INTRAVENOUS

## 2014-10-20 MED ORDER — ENOXAPARIN SODIUM 40 MG/0.4ML ~~LOC~~ SOLN
40.0000 mg | SUBCUTANEOUS | Status: DC
  Administered 2014-10-20 – 2014-10-22 (×3): 40 mg via SUBCUTANEOUS
  Filled 2014-10-20 (×3): qty 0.4

## 2014-10-20 MED ORDER — PNEUMOCOCCAL VAC POLYVALENT 25 MCG/0.5ML IJ INJ: 0.5000 mL | INJECTION | INTRAMUSCULAR | Status: DC

## 2014-10-20 MED ORDER — INSULIN ASPART 100 UNIT/ML ~~LOC~~ SOLN
20.0000 [IU] | Freq: Once | SUBCUTANEOUS | Status: AC
  Administered 2014-10-20: 20 [IU] via SUBCUTANEOUS

## 2014-10-20 MED ORDER — METHYLPREDNISOLONE SODIUM SUCC 40 MG IJ SOLR: 40.0000 mg | Freq: Two times a day (BID) | INTRAMUSCULAR | Status: DC

## 2014-10-20 MED ORDER — OXYCODONE-ACETAMINOPHEN 5-325 MG PO TABS
1.0000 | ORAL_TABLET | Freq: Two times a day (BID) | ORAL | Status: DC | PRN
  Administered 2014-10-20 – 2014-10-21 (×2): 1 via ORAL
  Filled 2014-10-20 (×2): qty 1

## 2014-10-20 MED ORDER — GUAIFENESIN-DM 100-10 MG/5ML PO SYRP
10.0000 mL | ORAL_SOLUTION | Freq: Four times a day (QID) | ORAL | Status: DC | PRN
  Administered 2014-10-20 – 2014-10-23 (×11): 10 mL via ORAL
  Filled 2014-10-20 (×12): qty 10

## 2014-10-20 MED ORDER — INFLUENZA VAC SPLIT QUAD 0.5 ML IM SUSY: 0.5000 mL | PREFILLED_SYRINGE | INTRAMUSCULAR | Status: DC

## 2014-10-20 MED ORDER — INSULIN GLARGINE 100 UNIT/ML ~~LOC~~ SOLN
8.0000 [IU] | Freq: Once | SUBCUTANEOUS | Status: AC
  Administered 2014-10-20: 13:00:00 8 [IU] via SUBCUTANEOUS
  Filled 2014-10-20: qty 0.08

## 2014-10-20 MED ORDER — INSULIN ASPART 100 UNIT/ML ~~LOC~~ SOLN
20.0000 [IU] | Freq: Once | SUBCUTANEOUS | Status: AC
  Administered 2014-10-20: 18:00:00 20 [IU] via SUBCUTANEOUS
  Filled 2014-10-20: qty 20

## 2014-10-20 MED ORDER — INSULIN ASPART 100 UNIT/ML ~~LOC~~ SOLN
20.0000 [IU] | Freq: Once | SUBCUTANEOUS | Status: AC
  Administered 2014-10-20: 14:00:00 20 [IU] via SUBCUTANEOUS
  Filled 2014-10-20: qty 20

## 2014-10-20 NOTE — Plan of Care (Addendum)
Problem: Discharge Progression Outcomes Goal: Complications resolved/controlled Outcome: Progressing Dyspnea- no c/o dyspnea this shift. Currently on 3 liters/min via [place].  O2 sats >90%- pt's O2 sats have remained >90% while on 3l/min. Home O2- will continue to monitor. Self administer respiratory meds- pt is able to administer inhalers with no difficulty.  PNA and Flu vaccine- order to administer entered.  Independent ADL's- continue to monitor, pt is weak and becomes short of breath with activity.  Barriers to progression- pt did verbalize that her husband has passed and only referenced her daughter as a support system.  Discharge plan- pt will discharge to home when stable. Pain controlled- pt c/o back pain not improved with PRN pain medication, MD notified and a one time order obtained for Percocet 1 tab 5-325. Explained to patient that this is a one time order and is not available again without notifying the MD.  Tolerating diet- Pt is tolerating diet well, c/o being hungry d/t steroids, several snacks given through out the shift. FSBS elevated 341, 4 units given per SSI order, in addition to 20units of Lantus.  Activity appropriate for discharge- pt continues to be weak, requiring assistance while up.  Hemodynamically stable- VSS, WBC's elevated antibiotics per order, continue to monitor labs.   A&O patient, c/o back pain not controlled with PRN tramadol. Pt very angry at the start of the shift, cussing and threatening to leave.  MD notified and a one time Percocet 5-325 1 tab order received and given with good effect noted. Pt c/o cough, improved with PRN cough medication. VSS. Moderate fall risk.

## 2014-10-20 NOTE — Plan of Care (Signed)
Problem: Discharge Progression Outcomes Goal: Complications resolved/controlled Outcome: Progressing VSS. O2 sats in the high 90's with 2L O2 per [place]. Pt on 2L O2 at home per pt. Up in the room independently. Dyspnea on exertion. Cough med given x2 with improvement. Tramadol, tylenol, percocet given for chronic generalized pain during the day with minimal improvement. CBG> 400 throughout the day, asymptomatic, MD notified, orders received and performed. Educated the pt about diet, still not compliant.

## 2014-10-20 NOTE — Progress Notes (Signed)
[facility] [place] NAME: Katie Guzman    MR#:  [id number]  DATE OF BIRTH:  August 17, 1970  SUBJECTIVE:  CHIEF COMPLAINT:  Patient is reporting productive cough. Shortness of breath with exertion. Requesting to see a pulmonologist. Admits to smoking one to one and half pack a day   REVIEW OF SYSTEMS:  CONSTITUTIONAL: No fever, fatigue or weakness.  EYES: No blurred or double vision.  EARS, NOSE, AND THROAT: No tinnitus or ear pain.  RESPIRATORY: reporting productive  cough, exertional  shortness of breath, wheezing .denies  hemoptysis.  CARDIOVASCULAR: No chest pain, orthopnea, edema.  GASTROINTESTINAL: No nausea, vomiting, diarrhea or abdominal pain.  GENITOURINARY: No dysuria, hematuria.  ENDOCRINE: No polyuria, nocturia,  HEMATOLOGY: No anemia, easy bruising or bleeding SKIN: No rash or lesion. MUSCULOSKELETAL: No joint pain or arthritis.   NEUROLOGIC: No tingling, numbness, weakness.  PSYCHIATRY: No anxiety or depression.   DRUG ALLERGIES:   Allergies  Allergen Reactions  . Darvocet [Propoxyphene N-Acetaminophen] Hives and Nausea And Vomiting  . Flexeril [Cyclobenzaprine] Hives and Nausea And Vomiting  . Toradol [Ketorolac Tromethamine] Nausea And Vomiting  . Vicodin [Hydrocodone-Acetaminophen] Hives and Nausea And Vomiting    VITALS:  Blood pressure 147/94, pulse 98, temperature 98.3 F (36.8 C), temperature source Oral, resp. rate 20, height 5\' 2"  (1.575 m), weight 77.202 kg (170 lb 3.2 oz), last menstrual period 09/18/2014, SpO2 94 %.  PHYSICAL EXAMINATION:  GENERAL:  44 y.o.-year-old patient lying in the bed with no acute distress.  EYES: Pupils equal, round, reactive to light and accommodation. No scleral icterus. Extraocular muscles intact.  HEENT: Head atraumatic, normocephalic. Oropharynx and nasopharynx clear.  NECK:  Supple, no jugular venous distention. No thyroid enlargement, no tenderness.  LUNGS: coarse  bronchial  breath sounds bilaterally, end expiratory  wheezing,  No rales,rhonchi or crepitation. No use of accessory muscles of respiration.  CARDIOVASCULAR: S1, S2 normal. No murmurs, rubs, or gallops.  ABDOMEN: Soft, nontender, nondistended. Bowel sounds present. No organomegaly or mass.  EXTREMITIES: No pedal edema, cyanosis, or clubbing.  NEUROLOGIC: Cranial nerves II through XII are intact. Muscle strength 5/5 in all extremities. Sensation intact. Gait not checked.  PSYCHIATRIC: The patient is alert and oriented x 3.  SKIN: No obvious rash, lesion, or ulcer.    LABORATORY PANEL:   CBC  Recent Labs Lab 10/20/14 0456  WBC 18.5*  HGB 15.5  HCT 48.3*  PLT 367   ------------------------------------------------------------------------------------------------------------------  Chemistries   Recent Labs Lab 10/20/14 0456  NA 134*  K 4.4  CL 98*  CO2 27  GLUCOSE 400*  BUN 14  CREATININE 0.60  CALCIUM 9.5   ------------------------------------------------------------------------------------------------------------------  Cardiac Enzymes No results for input(s): TROPONINI in the last 168 hours. ------------------------------------------------------------------------------------------------------------------  RADIOLOGY:  Dg Chest 2 View  10/19/2014   CLINICAL DATA:  Coughing and shortness of breath for 1.5 weeks.  EXAM: CHEST  2 VIEW  COMPARISON:  07/26/2013  FINDINGS: Mild thickening of interstitial lung markings bilaterally. This represents an interval change and raises concern for an atypical infection. No large areas of airspace disease or consolidation. Heart and mediastinum are within normal limits. No large pleural effusions. No acute bone abnormality.  IMPRESSION: Bilateral interstitial thickening. Differential includes atypical infection versus mild interstitial edema.   Electronically Signed   By: Markus Daft M.D.   On: 10/19/2014 12:33    EKG:   Orders placed or  performed during the hospital encounter of 10/19/14  . EKG 12-Lead  .  EKG 12-Lead    ASSESSMENT AND PLAN:   44 year old female with past medical history of diabetes, COPD, fibromyalgia, chronic pain, diabetic neuropathy who presented to the hospital with worsening shortness of breath cough and noted to be in COPD exacerbation.  #1 COPD exacerbation with ch hypoxia-continues to smoke  likely cause of patient's worsening shortness of breath, wheezing and cough. -Patient is noncompliant with her meds and also has possibly smoking and acute bronchitis has  led to this exacerbation. -Taper IV steroids, around-the-clock DuoNeb's, continue Dulera, [place]. - pulmonology consult is placed per patient's request -Additional dose of Lantus was given for steroid-induced hyperglycemia -Continue 2 L of oxygen via nasal cannula, patient lives on 2 L of oxygen at home   #2 Atypical pneumonia/ a bronchitis-likely community-acquired. -Continue IV Levaquin, follow sputum, blood cultures.  #3 diabetes type 2 without complication-continue metformin, Lantus, sliding scale insulin.  #4 diabetic neuropathy-continue Neurontin  #5 chronic pain/fibromyalgia-patient has high drug-seeking behavior per admitting physician's note . When necessary tramadol for now. will resume her home medication Percocet after  pharmacy verifies to prevent narcotic withdrawal   #6 dysuria-check urinalysis  Patient is on levofloxacin  #7 tobacco abuse disorder Counseled patient to quit smoking for 3-5 minutes provided nicotine patch Patient is considering to quit smoking.    All the records are reviewed and case discussed with Care Management/Social Workerr. Management plans discussed with the patient, family and they are in agreement.  CODE STATUS: FULL  TOTAL TIME TAKING CARE OF THIS PATIENT: 40  minutes.   POSSIBLE D/C IN 2-3 DAYS, DEPENDING ON CLINICAL CONDITION.   Nicholes Mango M.D on 10/20/2014  at 12:47 PM  Between 7am to 6pm - Pager - [phone] After 6pm go to [email or web address] - password [email or web address] [facility]  [place] [facility]  Office  [phone]  CC: Primary care physician; No PCP Per Patient

## 2014-10-20 NOTE — Progress Notes (Signed)
Dr Margaretmary Eddy requested by phone to place an order for CBG qx1hr x3 starting at 1500.

## 2014-10-21 ENCOUNTER — Inpatient Hospital Stay: Payer: Medicaid Other

## 2014-10-21 LAB — GLUCOSE, CAPILLARY
GLUCOSE-CAPILLARY: 239 mg/dL — AB (ref 65–99)
GLUCOSE-CAPILLARY: 443 mg/dL — AB (ref 65–99)
Glucose-Capillary: 333 mg/dL — ABNORMAL HIGH (ref 65–99)
Glucose-Capillary: 441 mg/dL — ABNORMAL HIGH (ref 65–99)
Glucose-Capillary: 203 mg/dL — ABNORMAL HIGH (ref 65–99)

## 2014-10-21 MED ORDER — INSULIN ASPART 100 UNIT/ML ~~LOC~~ SOLN
20.0000 [IU] | Freq: Once | SUBCUTANEOUS | Status: AC
  Administered 2014-10-21: 20 [IU] via SUBCUTANEOUS
  Filled 2014-10-21: qty 20

## 2014-10-21 MED ORDER — NYSTATIN 100000 UNIT/ML MT SUSP
5.0000 mL | Freq: Four times a day (QID) | OROMUCOSAL | Status: DC
  Administered 2014-10-21 – 2014-10-23 (×10): 500000 [IU] via OROMUCOSAL
  Filled 2014-10-21 (×10): qty 5

## 2014-10-21 MED ORDER — INSULIN GLARGINE 100 UNIT/ML ~~LOC~~ SOLN
20.0000 [IU] | Freq: Every day | SUBCUTANEOUS | Status: DC
  Administered 2014-10-22: 20 [IU] via SUBCUTANEOUS
  Filled 2014-10-21 (×3): qty 0.2

## 2014-10-21 MED ORDER — ROFLUMILAST 500 MCG PO TABS
500.0000 ug | ORAL_TABLET | Freq: Every day | ORAL | Status: DC
  Administered 2014-10-21 – 2014-10-23 (×3): 500 ug via ORAL
  Filled 2014-10-21 (×3): qty 1

## 2014-10-21 MED ORDER — OXYCODONE-ACETAMINOPHEN 5-325 MG PO TABS
1.0000 | ORAL_TABLET | Freq: Four times a day (QID) | ORAL | Status: DC | PRN
  Administered 2014-10-21 – 2014-10-23 (×9): 2 via ORAL
  Filled 2014-10-21 (×10): qty 2

## 2014-10-21 MED ORDER — SODIUM CHLORIDE 0.9 % IV SOLN
INTRAVENOUS | Status: DC
  Administered 2014-10-21 – 2014-10-23 (×4): via INTRAVENOUS

## 2014-10-21 MED ORDER — INSULIN GLARGINE 100 UNIT/ML ~~LOC~~ SOLN
10.0000 [IU] | SUBCUTANEOUS | Status: AC
  Administered 2014-10-21: 10 [IU] via SUBCUTANEOUS
  Filled 2014-10-21: qty 0.1

## 2014-10-21 MED ORDER — SODIUM CHLORIDE 0.9 % IV SOLN
Freq: Once | INTRAVENOUS | Status: AC
  Administered 2014-10-21: 17:00:00 via INTRAVENOUS

## 2014-10-21 MED ORDER — INSULIN ASPART 100 UNIT/ML ~~LOC~~ SOLN
25.0000 [IU] | Freq: Once | SUBCUTANEOUS | Status: AC
  Administered 2014-10-21: 13:00:00 25 [IU] via SUBCUTANEOUS
  Filled 2014-10-21: qty 25

## 2014-10-21 NOTE — Progress Notes (Signed)
Pt A&O; remains on Moderate Fall Risks; VSS; FSBS >400 gave insulin as ordered, pt given 1 liter NS bolus and started on continuous NS at 165ml/hr; PRN pain and cough medications given per pt request with some improvement; Pulmonary Consult performed by Dr Laurelyn Sickle; Podiatry Consult (left great toe wound/necrotic) and In-Pt Diabetes Consults to be called 10/22/14;

## 2014-10-21 NOTE — Consult Note (Signed)
Pulmonary Critical Care  Initial Consult Note   Katie Guzman XNA:[id number] DOB: [birth year] DOA: 10/19/2014  Referring physician: Darlen Round, MD PCP: No PCP Per Patient   Chief Complaint: SOB  HPI: Katie Guzman is a 44 y.o. female with prior history of DM Type II COPD chronic pain ongoing tobacco use presented to the ED with increased SOB cough and congestion. Patient has had ongoing tobacco use with a diagnosis of COPD as noted. She was recently admitted to [facility] with similar complaints. She had increased wheeze noted she had cough with sputum production noted. She had no fever noted. No hemoptysis noted. She has noted some chest tightness. In the hospital she has been slow to improve. She has been on duonebs and steroids along with empiric antibiotics.   Review of Systems:  Constitutional:  No weight loss, night sweats, Fevers, chills, +fatigue.  HEENT:  No headaches, nasal congestion, post nasal drip,  Cardio-vascular:  +Tightness chest, no swelling in lower extremities  GI:  No heartburn, indigestion, abdominal pain, nausea Resp:  +shortness of breath.  +productive cough, No coughing up of blood. +wheezing Skin:  no rash or lesions.  Musculoskeletal:  No joint pain or swelling.   Remainder ROS performed and is unremarkable other than noted in HPI  Past Medical History  Diagnosis Date  . Diabetes mellitus   . COPD (chronic obstructive pulmonary disease)   . Emphysema   . Neuropathy   . Fibromyalgia    Past Surgical History  Procedure Laterality Date  . Back surgery    . Tubal ligation    . Cesarean section    . Tonsillectomy     Social History:  reports that she quit smoking about 2 weeks ago. Her smoking use included Cigarettes. She has a 15 pack-year smoking history. She does not have any smokeless tobacco history on file. She reports that she does not drink alcohol or use illicit drugs.  Allergies  Allergen Reactions  . Darvocet [Propoxyphene  N-Acetaminophen] Hives and Nausea And Vomiting  . Flexeril [Cyclobenzaprine] Hives and Nausea And Vomiting  . Toradol [Ketorolac Tromethamine] Nausea And Vomiting  . Vicodin [Hydrocodone-Acetaminophen] Hives and Nausea And Vomiting    Family History  Problem Relation Age of Onset  . Lung cancer Mother     Prior to Admission medications   Medication Sig Start Date End Date Taking? Authorizing Provider  acetaminophen (TYLENOL) 325 MG tablet Take 650 mg by mouth every 6 (six) hours as needed for mild pain.   Yes Historical Provider, MD  albuterol (PROVENTIL HFA;VENTOLIN HFA) 108 (90 BASE) MCG/ACT inhaler Inhale 2 puffs into the lungs every 4 (four) hours as needed for wheezing or shortness of breath. For wheeze or shortness of breath   Yes Historical Provider, MD  albuterol (PROVENTIL) (2.5 MG/3ML) 0.083% nebulizer solution Take 2.5 mg by nebulization every 4 (four) hours as needed for wheezing or shortness of breath.    Yes Historical Provider, MD  fluticasone-salmeterol (ADVAIR HFA) 115-21 MCG/ACT inhaler Inhale 2 puffs into the lungs 2 (two) times daily.   Yes Historical Provider, MD  gabapentin (NEURONTIN) 400 MG capsule Take 800 mg by mouth 3 (three) times daily.   Yes Historical Provider, MD  guaiFENesin-codeine 100-10 MG/5ML syrup Take 5 mLs by mouth every 4 (four) hours as needed for cough.   Yes Historical Provider, MD  ibuprofen (ADVIL,MOTRIN) 600 MG tablet Take 600 mg by mouth every 6 (six) hours as needed for fever or mild pain.   Yes  Historical Provider, MD  insulin glargine (LANTUS) 100 UNIT/ML injection Inject 20 Units into the skin at bedtime.   Yes Historical Provider, MD  insulin NPH Human (HUMULIN N,NOVOLIN N) 100 UNIT/ML injection Inject 0-12 Units into the skin 4 (four) times daily -  before meals and at bedtime. Pt uses per sliding scale.   Yes Historical Provider, MD  lidocaine (LIDODERM) 5 % Place 1 patch onto the skin daily. Remove & Discard patch within 12 hours or as  directed by MD   Yes Historical Provider, MD  metFORMIN (GLUCOPHAGE) 500 MG tablet Take 500 mg by mouth 2 (two) times daily with a meal.   Yes Historical Provider, MD  nicotine (NICODERM CQ - DOSED IN MG/24 HOURS) 21 mg/24hr patch Place 21 mg onto the skin daily.   Yes Historical Provider, MD  tiotropium (SPIRIVA) 18 MCG inhalation capsule Place 18 mcg into inhaler and inhale daily.   Yes Historical Provider, MD  amoxicillin (AMOXIL) 500 MG capsule Take 1 capsule (500 mg total) by mouth 3 (three) times daily. Patient not taking: Reported on 10/19/2014 08/03/14   Elmyra Ricks Pisciotta, PA-C  metroNIDAZOLE (FLAGYL) 500 MG tablet Take 1 tablet (500 mg total) by mouth 3 (three) times daily. Do not drink alcohol while taking this medication or for several days after you finish the course Patient not taking: Reported on 10/19/2014 08/03/14   Elmyra Ricks Pisciotta, PA-C  ondansetron (ZOFRAN) 4 MG tablet Take 1 tablet (4 mg total) by mouth every 8 (eight) hours as needed for nausea or vomiting. Patient not taking: Reported on 10/19/2014 08/03/14   Monico Blitz, PA-C   Physical Exam: Filed Vitals:   10/21/14 0504 10/21/14 0853 10/21/14 1214 10/21/14 1244  BP: 138/69   138/83  Pulse: 78   94  Temp: 97.9 F (36.6 C)   98 F (36.7 C)  TempSrc: Oral   Oral  Resp: 18   20  Height:      Weight:      SpO2: 95% 97% 94% 97%    Wt Readings from Last 3 Encounters:  10/19/14 77.202 kg (170 lb 3.2 oz)  08/03/14 72.576 kg (160 lb)    General:  Appears calm and comfortable Eyes: PERRL, normal lids, irises & conjunctiva ENT: grossly normal hearing, lips & tongue Neck: no LAD, masses or thyromegaly Cardiovascular: RRR, no m/r/g. No LE edema. Respiratory: Diffuse Ronchi noted bilaterally. Normal respiratory effort. Abdomen: soft, nontender Skin: no rash or induration seen on limited exam Musculoskeletal: grossly normal tone BUE/BLE Psychiatric: grossly normal mood and affect Neurologic: grossly non-focal.           Labs on Admission:  Basic Metabolic Panel:  Recent Labs Lab 10/19/14 1146 10/19/14 1802 10/20/14 0456  NA 134*  --  134*  K 3.8  --  4.4  CL 94*  --  98*  CO2 27  --  27  GLUCOSE 314* 565* 400*  BUN 11  --  14  CREATININE 0.54  --  0.60  CALCIUM 9.2  --  9.5   Liver Function Tests: No results for input(s): AST, ALT, ALKPHOS, BILITOT, PROT, ALBUMIN in the last 168 hours. No results for input(s): LIPASE, AMYLASE in the last 168 hours. No results for input(s): AMMONIA in the last 168 hours. CBC:  Recent Labs Lab 10/19/14 1146 10/20/14 0456  WBC 19.9* 18.5*  NEUTROABS 16.1*  --   HGB 15.6 15.5  HCT 47.1* 48.3*  MCV 86.3 87.1  PLT 393 367   Cardiac Enzymes:  No results for input(s): CKTOTAL, CKMB, CKMBINDEX, TROPONINI in the last 168 hours.  BNP (last 3 results) No results for input(s): BNP in the last 8760 hours.  ProBNP (last 3 results) No results for input(s): PROBNP in the last 8760 hours.  CBG:  Recent Labs Lab 10/20/14 2005 10/20/14 2055 10/21/14 0505 10/21/14 0737 10/21/14 1103  GLUCAP 344* 277* 333* 239* 443*    Radiological Exams on Admission: No results found.  EKG: Independently reviewed.  Assessment/Plan Active Problems:   COPD exacerbation   1. Acute respiratory failure with hypoxia -will continue with oxygen therapy and titrate as tolerated -get an ABG now on her -continue with steroids -continue with bronchodilators  2. COPD exacerbation -on Oxygen -continue empiric antibiotics -on PO steroids -continue with dulera -add daliresp -CT chest has been ordered  3. Ongoing tobacco Use -smoking cessation counseling     I have personally obtained a history, examined the patient, evaluated laboratory and imaging results, formulated the assessment and plan and placed orders.  The Patient requires high complexity decision making for assessment and support.    Allyne Gee, MD [facility] Pulmonary Critical Care Medicine Sleep  Medicine

## 2014-10-21 NOTE — Plan of Care (Addendum)
Problem: Discharge Progression Outcomes Goal: Complications resolved/controlled 1.  Dyspnea controlled and resolving.  No labored breathing or c/o of breathing difficulties this shift.  2.  Patient on 2L O2 - stats are in high 90% 3.  Patient is on 2L O2 at home and will return to that at discharge. 4.  Patient requesting both flu and pneumonia vaccines.  Orders put in per protocol. 5.  Patient able to do ADL's independently. 6.  Patient has inadequate resources as barriers to progression.  Social work consult put in to aid with resources. 7.  Pain is not adequately controlled per patient.  She received Percocet Q12 and Tramadol Q6 and continues to express pain as 10/10.  She says the medications are not aiding in pain control.  She has been able to rest this shift. 8.  Patient is ambulating in room and hallway independently.  Gait is steady and she is not experiencing any SOB or breathing difficulties after ambulating. 9.  Hemodynamically patient has been stable this shift.  BP 151/72 mmHg  Pulse 85  Temp(Src) 98.1 F (36.7 C) (Oral)  Resp 18  Ht 5\' 2"  (1.575 m)  Wt 170 lb 3.2 oz (77.202 kg)  BMI 31.12 kg/m2  SpO2 96%  LMP 09/18/2014  10.  Patient continues with high blood glucose levels.  Patient educated on need to maintain a low carbohydrate/low sugar diet due to steroid therapy increasing sugar levels.  She requested sandwich trays 3 x this shift.  She was given sandwich tray one time and then offered no sugar jello as an alternative snack.  She states that she feels extremely hungry all the time.

## 2014-10-21 NOTE — Progress Notes (Addendum)
[facility] at [place] NAME: Katie Guzman    MR#:  [id number]  DATE OF BIRTH:  1970/12/27  SUBJECTIVE:  CHIEF COMPLAINT:  Patient is reporting productive cough. Not feeling better , couldn't sleep well last night because of the shortness of breat. Awaiting to see a pulmonologist. Admits to smoking one to one and half pack a day . As per staff patient is requesting double or triple trays and drinking sodas and Coke  REVIEW OF SYSTEMS:  CONSTITUTIONAL: No fever, fatigue or weakness.  EYES: No blurred or double vision.  EARS, NOSE, AND THROAT: No tinnitus or ear pain.  RESPIRATORY: reporting productive  cough, exertional  shortness of breath, wheezing .denies  hemoptysis.  CARDIOVASCULAR: No chest pain, orthopnea, edema.  GASTROINTESTINAL: No nausea, vomiting, diarrhea or abdominal pain.  GENITOURINARY: No dysuria, hematuria.  ENDOCRINE: No polyuria, nocturia,  HEMATOLOGY: No anemia, easy bruising or bleeding SKIN: No rash or lesion. MUSCULOSKELETAL: No joint pain or arthritis.   NEUROLOGIC: No tingling, numbness, weakness.  PSYCHIATRY: No anxiety or depression.   DRUG ALLERGIES:   Allergies  Allergen Reactions  . Darvocet [Propoxyphene N-Acetaminophen] Hives and Nausea And Vomiting  . Flexeril [Cyclobenzaprine] Hives and Nausea And Vomiting  . Toradol [Ketorolac Tromethamine] Nausea And Vomiting  . Vicodin [Hydrocodone-Acetaminophen] Hives and Nausea And Vomiting    VITALS:  Blood pressure 138/69, pulse 78, temperature 97.9 F (36.6 C), temperature source Oral, resp. rate 18, height 5\' 2"  (1.575 m), weight 77.202 kg (170 lb 3.2 oz), last menstrual period 09/18/2014, SpO2 94 %.  PHYSICAL EXAMINATION:  GENERAL:  44 y.o.-year-old patient lying in the bed with no acute distress.  EYES: Pupils equal, round, reactive to light and accommodation. No scleral icterus. Extraocular muscles intact.  HEENT: Head atraumatic, normocephalic.  Oropharynx and nasopharynx clear.  NECK:  Supple, no jugular venous distention. No thyroid enlargement, no tenderness.  LUNGS: coarse bronchial  breath sounds bilaterally, end expiratory  wheezing,  No rales,rhonchi or crepitation. No use of accessory muscles of respiration.  CARDIOVASCULAR: S1, S2 normal. No murmurs, rubs, or gallops.  ABDOMEN: Soft, nontender, nondistended. Bowel sounds present. No organomegaly or mass.  EXTREMITIES: No pedal edema, cyanosis, or clubbing.  NEUROLOGIC: Cranial nerves II through XII are intact. Muscle strength 5/5 in all extremities. Sensation intact. Gait not checked.  PSYCHIATRIC: The patient is alert and oriented x 3.  SKIN: No obvious rash, lesion, or ulcer.    LABORATORY PANEL:   CBC  Recent Labs Lab 10/20/14 0456  WBC 18.5*  HGB 15.5  HCT 48.3*  PLT 367   ------------------------------------------------------------------------------------------------------------------  Chemistries   Recent Labs Lab 10/20/14 0456  NA 134*  K 4.4  CL 98*  CO2 27  GLUCOSE 400*  BUN 14  CREATININE 0.60  CALCIUM 9.5   ------------------------------------------------------------------------------------------------------------------  Cardiac Enzymes No results for input(s): TROPONINI in the last 168 hours. ------------------------------------------------------------------------------------------------------------------  RADIOLOGY:  No results found.  EKG:   Orders placed or performed during the hospital encounter of 10/19/14  . EKG 12-Lead  . EKG 12-Lead    ASSESSMENT AND PLAN:   44 year old female with past medical history of diabetes, COPD, fibromyalgia, chronic pain, diabetic neuropathy who presented to the hospital with worsening shortness of breath cough and noted to be in COPD exacerbation.  #1 COPD exacerbation with ch hypoxia-continues to smoke  likely cause of patient's worsening shortness of breath, wheezing and cough. -Patient is  noncompliant with her meds and also has possibly smoking  and acute bronchitis has  led to this exacerbation. -Tapered IV to by mouth steroids, around-the-clock DuoNeb's, continue Dulera, Spiriva. -Continue Levaquin. - pulmonology consult is placed per patient's request -Additional dose of Lantus 10 units was given for steroid-induced hyperglycemia and noncompliance with the food -Continue 2 L of oxygen via nasal cannula, patient lives on 2 L of oxygen at home   #2 Atypical pneumonia/ a bronchitis-likely community-acquired. -Continue IV Levaquin, follow sputum, blood cultures.  #3 diabetes type 2 with hyperglycemia- Patient is noncompliant with food and ordering double or triple trays continue metformin, sliding scale insulin Lantus dose is increased to 20 units subcutaneously twice a day Consult is placed to diabetic coordinator Check hemoglobin A1c.  #4 diabetic neuropathy-continue Neurontin  #5 chronic pain/fibromyalgia-patient has high drug-seeking behavior per admitting physician's note . When necessary tramadol for now.  Patient's daughter brought in an empty Percocet bottle with a different first name as reported by the staff , not quite sure if that is the patient's bottle . Will give Percocet 5 mg every 12 hours when necessary to prevent narcotic withdrawal   #6 dysuria-better Patient is on levofloxacin Urine culture with no growth in 24 hours  #7 tobacco abuse disorder Counseled patient to quit smoking for 3-5 minutes provided nicotine patch Patient is considering to quit smoking.  #8 left great toe wound-not healing Surgical consult is placed   PT consult is placed for evaluation  I have reinforced the importance of being compliant with her medications and diabetic diet. Patient verbalized understanding   All the records are reviewed and case discussed with Care Management/Social Workerr. Management plans discussed with the patient, family and they are in  agreement.  CODE STATUS: FULL  TOTAL TIME TAKING CARE OF THIS PATIENT: 35  minutes.   POSSIBLE D/C IN 2-3 DAYS, DEPENDING ON CLINICAL CONDITION.   Katie Guzman M.D on 10/21/2014 at 12:33 PM  Between 7am to 6pm - Pager - [phone] After 6pm go to [email or web address] - password [email or web address] [facility]  [place] [facility]  Office  [phone]  CC: Primary care physician; No PCP Per Patient

## 2014-10-22 LAB — BLOOD GAS, ARTERIAL
ACID-BASE EXCESS: 4.2 mmol/L — AB (ref 0.0–3.0)
BICARBONATE: 29.2 meq/L — AB (ref 21.0–28.0)
FIO2: 0.21
O2 SAT: 90.9 %
PATIENT TEMPERATURE: 37
PO2 ART: 59 mmHg — AB (ref 83.0–108.0)
pCO2 arterial: 44 mmHg (ref 32.0–48.0)
pH, Arterial: 7.43 (ref 7.350–7.450)

## 2014-10-22 LAB — CBC
HEMATOCRIT: 43.4 % (ref 35.0–47.0)
Hemoglobin: 14.1 g/dL (ref 12.0–16.0)
MCH: 28.7 pg (ref 26.0–34.0)
MCHC: 32.5 g/dL (ref 32.0–36.0)
MCV: 88.2 fL (ref 80.0–100.0)
Platelets: 384 10*3/uL (ref 150–440)
RBC: 4.93 MIL/uL (ref 3.80–5.20)
RDW: 13.2 % (ref 11.5–14.5)
WBC: 21.8 10*3/uL — ABNORMAL HIGH (ref 3.6–11.0)

## 2014-10-22 LAB — GLUCOSE, CAPILLARY
GLUCOSE-CAPILLARY: 270 mg/dL — AB (ref 65–99)
Glucose-Capillary: 343 mg/dL — ABNORMAL HIGH (ref 65–99)
Glucose-Capillary: 431 mg/dL — ABNORMAL HIGH (ref 65–99)
Glucose-Capillary: 248 mg/dL — ABNORMAL HIGH (ref 65–99)

## 2014-10-22 LAB — BASIC METABOLIC PANEL
Anion gap: 8 (ref 5–15)
BUN: 15 mg/dL (ref 6–20)
CALCIUM: 8.8 mg/dL — AB (ref 8.9–10.3)
CO2: 31 mmol/L (ref 22–32)
CREATININE: 0.6 mg/dL (ref 0.44–1.00)
Chloride: 95 mmol/L — ABNORMAL LOW (ref 101–111)
GFR calc non Af Amer: >60 mL/min (ref >60)
GLUCOSE: 309 mg/dL — AB (ref 65–99)
Potassium: 4.6 mmol/L (ref 3.5–5.1)
Sodium: 134 mmol/L — ABNORMAL LOW (ref 135–145)

## 2014-10-22 LAB — URINE CULTURE

## 2014-10-22 MED ORDER — INSULIN GLARGINE 100 UNIT/ML ~~LOC~~ SOLN
25.0000 [IU] | Freq: Every day | SUBCUTANEOUS | Status: DC
  Filled 2014-10-22: qty 0.25

## 2014-10-22 MED ORDER — INSULIN ASPART 100 UNIT/ML ~~LOC~~ SOLN
10.0000 [IU] | Freq: Three times a day (TID) | SUBCUTANEOUS | Status: DC
  Administered 2014-10-22 – 2014-10-23 (×4): 10 [IU] via SUBCUTANEOUS
  Filled 2014-10-22 (×4): qty 10

## 2014-10-22 MED ORDER — SODIUM CHLORIDE 0.9 % IV SOLN
Freq: Once | INTRAVENOUS | Status: AC
  Administered 2014-10-22: 17:00:00 via INTRAVENOUS

## 2014-10-22 MED ORDER — INSULIN ASPART 100 UNIT/ML ~~LOC~~ SOLN: 20.0000 [IU] | Freq: Once | SUBCUTANEOUS | Status: DC

## 2014-10-22 MED ORDER — INSULIN GLARGINE 100 UNIT/ML ~~LOC~~ SOLN
25.0000 [IU] | Freq: Two times a day (BID) | SUBCUTANEOUS | Status: DC
  Administered 2014-10-22 – 2014-10-23 (×3): 25 [IU] via SUBCUTANEOUS
  Filled 2014-10-22 (×4): qty 0.25

## 2014-10-22 MED ORDER — ZOLPIDEM TARTRATE 5 MG PO TABS
5.0000 mg | ORAL_TABLET | Freq: Every evening | ORAL | Status: DC | PRN
  Administered 2014-10-22 – 2014-10-23 (×2): 5 mg via ORAL
  Filled 2014-10-22 (×3): qty 1

## 2014-10-22 NOTE — Progress Notes (Addendum)
Pt restless, continues to talk on phone and others, agitated mood, walking in hall, up to BR in RM 121 after using her BR in her room. Reports she has called someone to come talk with her.  Pt states, "I will get something decent to eat if I have to have someone bring it in."  Female observed entering the room carrying food type bag in approximately 20 minutes after pt's statement.

## 2014-10-22 NOTE — Plan of Care (Signed)
Problem: Discharge Progression Outcomes Goal: Complications resolved/controlled Outcome: Progressing Plan of care progress to goals: PNA and Flu vaccine- order to administer entered.   Barriers to progression-pt has history of DM and COPD, pt is noncompliant with her diet, requesting snacks throughout shift, Diabetes consult entered.  Discharge plan- pt will discharge to home when stable. Pain controlled- pt c/o back pain, improved with PRN pain medication, c/o cough improved with PRN cough medication. Pt c/o insomnia, Dr. Lavetta Nielsen notified and new order for Ambien entered and given per order.   Tolerating diet- Pt is tolerating diet well, c/o being hungry d/t steroids, several snacks given through out the shift. FSBS elevated 203, 2 units given per SSI order, in addition to 20units of Lantus per order.

## 2014-10-22 NOTE — Progress Notes (Addendum)
Inpatient Diabetes Program Recommendations  AACE/ADA: New Consensus Statement on Inpatient Glycemic Control (2013)  Target Ranges:  Prepandial:   less than 140 mg/dL      Peak postprandial:   less than 180 mg/dL (1-2 hours)      Critically ill patients:  140 - 180 mg/dL   Results for Katie Guzman, Katie Guzman (MRN [id number]) as of 10/22/2014 08:15  Ref. Range 10/21/2014 07:37 10/21/2014 11:03 10/21/2014 16:14 10/21/2014 21:08 10/22/2014 07:17  Glucose-Capillary Latest Ref Range: 65-99 mg/dL 239 (H) 443 (H) 441 (H) 203 (H) 343 (H)    Diabetes history: DM2 Outpatient Diabetes medications: Lantus 20 units QHS, Metformin 500 mg BID, NPH 0-12 units QID with meals and HS (according to home medication list; however [facility] discharge summary for 10/15/14 patient was discharged on Novolin R 0-12 units QID with meals and at bedtime based on scale) Current orders for Inpatient glycemic control: Lantus 30 units BID, Novolog 0-15 units with units, Novolog 0-5 units HS, Metformin 500 mg BID  Inpatient Diabetes Program Recommendations Insulin - Basal: Fasting glucose 343 mg/dl this morning. Please consider increasing Lantus to 25 units BID. Insulin - Meal Coverage: If steroids will be continued, please order Novolog 10 units TID with meals for meal coverage (in addition to Novolog correction scale). HgbA1C: Per chart review, A1C was 11.1% on 10/13/14 at [facility] indicating very poor outpatient glycemic control.  10/22/14@ 13:20-Spoke with patient, her daughter, and her friend about diabetes and home regimen for diabetes control. Patient reports that she has not been able to get a PCP or an Endocrinologist because she does not have a job, no insurance, and is in the process of trying to get disability. Patient states that she lives with her daughter and her grandchildren here in [place]. Patient was recently at [facility] from 10/13/14 to 10/15/14 and was discharged on Metformin 500 mg BID, Lantus 20 units QHS, and Humulin R 0-12 units QID (with  meals and at bedtime; based on a correction scale). Patient reports that she was not able to get the Lantus because she was not provided with any at discharge and she was not provided with a prescription for Lantus either. Patient states that she had been given paperwork to fill out for medication assistance but she was never able to get the paperwork completed due to getting worse and having to come to [facility]. Patient was also advised to follow up with [facility] but had not followed up yet due to being admitted at [facility]. Patient reports that she would prefer to find a local PCP and medication assistance if possible. Discussed [facility] and [facility]. Patient states that she has been to [facility] in the past and she is interested in going to the [facility] for follow up care and would like to apply for medication assistance through the [facility]. Patient states that she does not have a glucometer and testing supplies and will also need a prescription for them at time of discharge. Prior to recent hospitalization at [facility] patient was "not able to afford her DM medications because the insulin was so expensive, over $300 per vial".  Discussed generic insulins at [employer] (Novolin R, Novolin 70/30, and Novolin N) which can be purchased for $25 per vial without a prescription so that patient would be aware of more affordable insulin that she could purchase even if she could not afford to go to the doctor.  Discussed A1C results (11.1% on 10/13/14  at [facility]) and reviewed what an A1C is, basic pathophysiology of DM Type 2, basic home care, importance of checking CBGs and maintaining good CBG control to prevent long-term and short-term complications. Discussed impact of nutrition, exercise, stress, sickness, and medications on diabetes control. Stressed importance of improving glycemic control and explained how hyperglycemia leads to damage within blood  vessels which lead to potential complications. Encouraged patient to establish follow up care, take medications as prescribed, monitor glucose 3-4 times per day, and keep a log of glucose readings which will help the doctor make adjustments with DM medications.  Patient verbalized understanding of information discussed and she states that she has no further questions at this time related to diabetes.   [place], RN, MSN, CM and left a message on her cell phone regarding patient's interest in the [facility] and the [facility].  At time of discharge, patient will need prescription for insulins, glucometer, test strips, lancets, and insulin syringes.  Thanks, Barnie Alderman, RN, MSN, CCRN, CDE Diabetes Coordinator Inpatient Diabetes Program [phone] (Team Pager from [place] to [place]) [phone] ([facility] office) [phone] [facility] office) [phone] [facility] office)

## 2014-10-22 NOTE — Progress Notes (Signed)
Staff observed outside food being delivered to room and pt eating rice and biscuits from [employer] bag. Pt c/o of being hungry to nurse asking for additional food. Two diet jello's given with 2 diet drinks. FSBS 248 with additional sliding scale insulin given with Lantus 25 units. Pt observed talking on phone continuously with sign/symptom distress. Pt requested MD be called to remove her diet restrictions. Paged and spoke with  Dr. Lavetta Nielsen with no new orders given. Pt advised to discuss her MD in a.m. Additional diet jello supplied for pt comfort.

## 2014-10-22 NOTE — Progress Notes (Signed)
[place] at [place] NAME: Katie Guzman    MR#:  [id number]  DATE OF BIRTH:  [birth year]  SUBJECTIVE:  CHIEF COMPLAINT:  Patient is feeling better , seen by pulmonologist yesterday   REVIEW OF SYSTEMS:  CONSTITUTIONAL: No fever, fatigue or weakness.  EYES: No blurred or double vision.  EARS, NOSE, AND THROAT: No tinnitus or ear pain.  RESPIRATORY: reporting less productive  cough, minimal wheezing .denies  hemoptysis.  CARDIOVASCULAR: No chest pain, orthopnea, edema.  GASTROINTESTINAL: No nausea, vomiting, diarrhea or abdominal pain.  GENITOURINARY: No dysuria, hematuria.  ENDOCRINE: No polyuria, nocturia,  HEMATOLOGY: No anemia, easy bruising or bleeding SKIN: No rash or lesion. MUSCULOSKELETAL: No joint pain or arthritis.   NEUROLOGIC: No tingling, numbness, weakness.  PSYCHIATRY: No anxiety or depression.   DRUG ALLERGIES:   Allergies  Allergen Reactions  . Darvocet [Propoxyphene N-Acetaminophen] Hives and Nausea And Vomiting  . Flexeril [Cyclobenzaprine] Hives and Nausea And Vomiting  . Toradol [Ketorolac Tromethamine] Nausea And Vomiting  . Vicodin [Hydrocodone-Acetaminophen] Hives and Nausea And Vomiting    VITALS:  Blood pressure 139/62, pulse 80, temperature 97.3 F (36.3 C), temperature source Axillary, resp. rate 20, height 5\' 2"  (1.575 m), weight 77.202 kg (170 lb 3.2 oz), last menstrual period 09/18/2014, SpO2 95 %.  PHYSICAL EXAMINATION:  GENERAL:  44 y.o.-year-old patient lying in the bed with no acute distress.  EYES: Pupils equal, round, reactive to light and accommodation. No scleral icterus. Extraocular muscles intact.  HEENT: Head atraumatic, normocephalic. Oropharynx and nasopharynx clear.  NECK:  Supple, no jugular venous distention. No thyroid enlargement, no tenderness.  LUNGS: coarse bronchial  breath sounds bilaterally, end expiratory  wheezing,  No rales,rhonchi or crepitation. No use of accessory  muscles of respiration.  CARDIOVASCULAR: S1, S2 normal. No murmurs, rubs, or gallops.  ABDOMEN: Soft, nontender, nondistended. Bowel sounds present. No organomegaly or mass.  EXTREMITIES: No pedal edema, cyanosis, or clubbing.  NEUROLOGIC: Cranial nerves II through XII are intact. Muscle strength 5/5 in all extremities. Sensation intact. Gait not checked.  PSYCHIATRIC: The patient is alert and oriented x 3.  SKIN: No obvious rash, lesion, or ulcer.    LABORATORY PANEL:   CBC  Recent Labs Lab 10/22/14 0514  WBC 21.8*  HGB 14.1  HCT 43.4  PLT 384   ------------------------------------------------------------------------------------------------------------------  Chemistries   Recent Labs Lab 10/22/14 0514  NA 134*  K 4.6  CL 95*  CO2 31  GLUCOSE 309*  BUN 15  CREATININE 0.60  CALCIUM 8.8*   ------------------------------------------------------------------------------------------------------------------  Cardiac Enzymes No results for input(s): TROPONINI in the last 168 hours. ------------------------------------------------------------------------------------------------------------------  RADIOLOGY:  Ct Chest Wo Contrast  10/21/2014   CLINICAL DATA:  Shortness of breath, recent history of smoking cessation  EXAM: CT CHEST WITHOUT CONTRAST  TECHNIQUE: Multidetector CT imaging of the chest was performed following the standard protocol without IV contrast.  COMPARISON:  10/19/2014  FINDINGS: The lungs are well aerated bilaterally but demonstrate patchy ground-glass infiltrate without focal confluent component. No parenchymal nodule is seen. The thoracic inlet is unremarkable.  The thoracic aorta shows some mild calcification although no aneurysmal dilatation is seen. No hilar or mediastinal adenopathy is noted. The visualized upper abdomen shows a hypodense lesion within the left adrenal gland measuring 3.1 cm. This is stable from 09/05/2013 and likely represents an adenoma.   IMPRESSION: Stable left adrenal lesion likely representing an adenoma.  Patchy ground-glass infiltrate throughout both lungs without focal confluent component. No  sizable effusion is seen.   Electronically Signed   By: Inez Catalina M.D.   On: 10/21/2014 15:44    EKG:   Orders placed or performed during the hospital encounter of 10/19/14  . EKG 12-Lead  . EKG 12-Lead    ASSESSMENT AND PLAN:   44 year old female with past medical history of diabetes, COPD, fibromyalgia, chronic pain, diabetic neuropathy who presented to the hospital with worsening shortness of breath cough and noted to be in COPD exacerbation.  #1 COPD exacerbation with ch hypoxia-continues to smoke  likely cause of patient's worsening shortness of breath, wheezing and cough. -Patient is noncompliant with her meds and also has possibly smoking and acute bronchitis has  led to this exacerbation. -Tapered IV to by mouth steroids, around-the-clock DuoNeb's, continue Dulera, Spiriva. -Continue Levaquin. - Appreciate pulmonology's recommendations -Continue 2 L of oxygen via nasal cannula, patient lives on 2 L of oxygen at home   #2 Atypical pneumonia/ a bronchitis-likely community-acquired. -Continue IV Levaquin for a total of 5 days  #3 diabetes type 2 with hyperglycemia-uncontrolled with hemoglobin A1c at 11.1 continue metformin, sliding scale insulin Lantus dose is increased to 25units subcutaneously twice a day NovoLog 10 units 3 times a day with meals Appreciate diabetic coordinator   #4 diabetic neuropathy-continue Neurontin  #5 chronic pain/fibromyalgia-patient has high drug-seeking behavior per admitting physician's note . When necessary tramadol for now.  Patient's daughter brought in an empty Percocet bottle with a different first name as reported by the staff , not quite sure if that is the patient's bottle . Will give Percocet 5 mg every 12 hours when necessary to prevent narcotic withdrawal   #6  dysuria-better Patient is on levofloxacin Urine culture with no growth in 24 hours  #7 tobacco abuse disorder Counseled patient to quit smoking for 3-5 minutes provided nicotine patch Patient is considering to quit smoking.  #8 left great toe -not healing bruise Status post bedside debridement and dressing Appreciate Dr. Olin Pia recommendations  PT consult is placed for evaluation  I have reinforced the importance of being compliant with her medications and diabetic diet. Patient verbalized understanding   All the records are reviewed and case discussed with Care Management/Social Workerr. Management plans discussed with the patient, family and they are in agreement.  CODE STATUS: FULL  TOTAL TIME TAKING CARE OF THIS PATIENT: 35  minutes.   POSSIBLE D/C IN 1-2 DAYS, DEPENDING ON CLINICAL CONDITION.   Nicholes Mango M.D on 10/22/2014 at 4:18 PM  Between 7am to 6pm - Pager - [phone] After 6pm go to [email or web address] - password [email or web address] [facility]  [facility]  Office  [phone]  CC: Primary care physician; No PCP Per Patient

## 2014-10-22 NOTE — Plan of Care (Signed)
Problem: Discharge Progression Outcomes Goal: Complications resolved/controlled Outcome: Progressing Plan of care progress to goal for: 1. Pain-pt c/o pain in abdomen from coughing, prn meds given with improvement 2. Hemodynamically-             -VSS, pt remains afebrile this shift             -IV fluids continue per orders             -IV ABT continue per orders 3. Complications-no evidence of this shift 4. Diet-pt tolerating diet this shift 5. Activity-pt is up in room unassisted

## 2014-10-22 NOTE — Progress Notes (Signed)
Dr. Lavetta Nielsen notified of patient's c/o insomnia, requesting something to help her sleep. New order to be entered by Dr. Lavetta Nielsen.

## 2014-10-22 NOTE — Care Management (Signed)
Admitted to [facility] with the diagnosis of COPD. Daughter is [place]  [phone]). Released from [place] last week. Advised to followed up at [facility]. Chronic Home oxygen thru [place]. Uses a cane as needed for balance.  Discussed going to [facility]. States if she gets into trouble before getting accepted at [facility], she will go to [facility] or [place]. States she takes no prescription medications. Shelbie Ammons RN MSN Care Management [phone]

## 2014-10-22 NOTE — Consult Note (Signed)
Reason for Consult: Nonhealing wound on her left great toe Referring Physician: [facility]  Katie Guzman is an 44 y.o. female.  HPI: The patient relates a history of an injury a few weeks ago where she stubbed her left great toe and then her 180 pound dog stepped on it. A bruised and discolored area appeared that she denies any drainage since the injury. States it just is not healing.  Past Medical History  Diagnosis Date  . Diabetes mellitus   . COPD (chronic obstructive pulmonary disease)   . Emphysema   . Neuropathy   . Fibromyalgia     Past Surgical History  Procedure Laterality Date  . Back surgery    . Tubal ligation    . Cesarean section    . Tonsillectomy      Family History  Problem Relation Age of Onset  . Lung cancer Mother     Social History:  reports that she quit smoking about 2 weeks ago. Her smoking use included Cigarettes. She has a 15 pack-year smoking history. She does not have any smokeless tobacco history on file. She reports that she does not drink alcohol or use illicit drugs.  Allergies:  Allergies  Allergen Reactions  . Darvocet [Propoxyphene N-Acetaminophen] Hives and Nausea And Vomiting  . Flexeril [Cyclobenzaprine] Hives and Nausea And Vomiting  . Toradol [Ketorolac Tromethamine] Nausea And Vomiting  . Vicodin [Hydrocodone-Acetaminophen] Hives and Nausea And Vomiting    Medications: I have reviewed the patient's current medications.  Results for orders placed or performed during the hospital encounter of 10/19/14 (from the past 48 hour(s))  Glucose, capillary     Status: Abnormal   Collection Time: 10/20/14  1:21 PM  Result Value Ref Range   Glucose-Capillary 520 (H) 65 - 99 mg/dL  Glucose, capillary     Status: Abnormal   Collection Time: 10/20/14  3:02 PM  Result Value Ref Range   Glucose-Capillary 415 (H) 65 - 99 mg/dL  Glucose, capillary     Status: Abnormal   Collection Time: 10/20/14  3:13 PM  Result Value Ref Range    Glucose-Capillary 404 (H) 65 - 99 mg/dL  Glucose, capillary     Status: Abnormal   Collection Time: 10/20/14  3:52 PM  Result Value Ref Range   Glucose-Capillary 321 (H) 65 - 99 mg/dL  Urinalysis complete, with microscopic ([facility] only)     Status: Abnormal   Collection Time: 10/20/14  4:23 PM  Result Value Ref Range   Color, Urine STRAW (A) YELLOW   APPearance CLEAR (A) CLEAR   Glucose, UA >500 (A) NEGATIVE mg/dL   Bilirubin Urine NEGATIVE NEGATIVE   Ketones, ur TRACE (A) NEGATIVE mg/dL   Specific Gravity, Urine 1.033 (H) 1.005 - 1.030   Hgb urine dipstick NEGATIVE NEGATIVE   pH 6.0 5.0 - 8.0   Protein, ur NEGATIVE NEGATIVE mg/dL   Nitrite NEGATIVE NEGATIVE   Leukocytes, UA NEGATIVE NEGATIVE   RBC / HPF 0-5 0 - 5 RBC/hpf   WBC, UA 0-5 0 - 5 WBC/hpf   Bacteria, UA NONE SEEN NONE SEEN   Squamous Epithelial / LPF 0-5 (A) NONE SEEN  Urine culture     Status: None   Collection Time: 10/20/14  4:23 PM  Result Value Ref Range   Specimen Description URINE, RANDOM    Special Requests URINE, RANDOM    Culture MULTIPLE SPECIES PRESENT, SUGGEST RECOLLECTION    Report Status 10/22/2014 FINAL   Glucose, capillary  Status: Abnormal   Collection Time: 10/20/14  4:57 PM  Result Value Ref Range   Glucose-Capillary 371 (H) 65 - 99 mg/dL  Glucose, capillary     Status: Abnormal   Collection Time: 10/20/14  6:04 PM  Result Value Ref Range   Glucose-Capillary 432 (H) 65 - 99 mg/dL  Glucose, capillary     Status: Abnormal   Collection Time: 10/20/14  6:07 PM  Result Value Ref Range   Glucose-Capillary 450 (H) 65 - 99 mg/dL  Glucose, capillary     Status: Abnormal   Collection Time: 10/20/14  7:02 PM  Result Value Ref Range   Glucose-Capillary 521 (H) 65 - 99 mg/dL   Comment 1 Notify RN   Glucose, capillary     Status: Abnormal   Collection Time: 10/20/14  7:03 PM  Result Value Ref Range   Glucose-Capillary 477 (H) 65 - 99 mg/dL   Comment 1 Notify RN   Glucose, capillary     Status:  Abnormal   Collection Time: 10/20/14  8:05 PM  Result Value Ref Range   Glucose-Capillary 344 (H) 65 - 99 mg/dL  Glucose, capillary     Status: Abnormal   Collection Time: 10/20/14  8:55 PM  Result Value Ref Range   Glucose-Capillary 277 (H) 65 - 99 mg/dL   Comment 1 Notify RN   Glucose, capillary     Status: Abnormal   Collection Time: 10/21/14  5:05 AM  Result Value Ref Range   Glucose-Capillary 333 (H) 65 - 99 mg/dL   Comment 1 Notify RN   Glucose, capillary     Status: Abnormal   Collection Time: 10/21/14  7:37 AM  Result Value Ref Range   Glucose-Capillary 239 (H) 65 - 99 mg/dL  Glucose, capillary     Status: Abnormal   Collection Time: 10/21/14 11:03 AM  Result Value Ref Range   Glucose-Capillary 443 (H) 65 - 99 mg/dL  Glucose, capillary     Status: Abnormal   Collection Time: 10/21/14  4:14 PM  Result Value Ref Range   Glucose-Capillary 441 (H) 65 - 99 mg/dL  Blood gas, arterial     Status: Abnormal   Collection Time: 10/21/14  4:20 PM  Result Value Ref Range   FIO2 0.21    pH, Arterial 7.43 7.350 - 7.450   pCO2 arterial 44 32.0 - 48.0 mmHg   pO2, Arterial 59 (L) 83.0 - 108.0 mmHg   Bicarbonate 29.2 (H) 21.0 - 28.0 mEq/L   Acid-Base Excess 4.2 (H) 0.0 - 3.0 mmol/L   O2 Saturation 90.9 %   Patient temperature 37.0    Collection site RIGHT BRACHIAL    Sample type ARTERIAL DRAW   Glucose, capillary     Status: Abnormal   Collection Time: 10/21/14  9:08 PM  Result Value Ref Range   Glucose-Capillary 203 (H) 65 - 99 mg/dL   Comment 1 Notify RN   CBC     Status: Abnormal   Collection Time: 10/22/14  5:14 AM  Result Value Ref Range   WBC 21.8 (H) 3.6 - 11.0 K/uL   RBC 4.93 3.80 - 5.20 MIL/uL   Hemoglobin 14.1 12.0 - 16.0 g/dL   HCT 43.4 35.0 - 47.0 %   MCV 88.2 80.0 - 100.0 fL   MCH 28.7 26.0 - 34.0 pg   MCHC 32.5 32.0 - 36.0 g/dL   RDW 13.2 11.5 - 14.5 %   Platelets 384 150 - 440 K/uL  Basic metabolic panel  Status: Abnormal   Collection Time: 10/22/14   5:14 AM  Result Value Ref Range   Sodium 134 (L) 135 - 145 mmol/L   Potassium 4.6 3.5 - 5.1 mmol/L   Chloride 95 (L) 101 - 111 mmol/L   CO2 31 22 - 32 mmol/L   Glucose, Bld 309 (H) 65 - 99 mg/dL   BUN 15 6 - 20 mg/dL   Creatinine, Ser 0.60 0.44 - 1.00 mg/dL   Calcium 8.8 (L) 8.9 - 10.3 mg/dL   GFR calc non Af Amer >60 >60 mL/min   GFR calc Af Amer >60 >60 mL/min    Comment: (NOTE) The eGFR has been calculated using the CKD EPI equation. This calculation has not been validated in all clinical situations. eGFR's persistently <60 mL/min signify possible Chronic Kidney Disease.    Anion gap 8 5 - 15  Glucose, capillary     Status: Abnormal   Collection Time: 10/22/14  7:17 AM  Result Value Ref Range   Glucose-Capillary 343 (H) 65 - 99 mg/dL  Glucose, capillary     Status: Abnormal   Collection Time: 10/22/14 11:02 AM  Result Value Ref Range   Glucose-Capillary 270 (H) 65 - 99 mg/dL    Ct Chest Wo Contrast  10/21/2014   CLINICAL DATA:  Shortness of breath, recent history of smoking cessation  EXAM: CT CHEST WITHOUT CONTRAST  TECHNIQUE: Multidetector CT imaging of the chest was performed following the standard protocol without IV contrast.  COMPARISON:  10/19/2014  FINDINGS: The lungs are well aerated bilaterally but demonstrate patchy ground-glass infiltrate without focal confluent component. No parenchymal nodule is seen. The thoracic inlet is unremarkable.  The thoracic aorta shows some mild calcification although no aneurysmal dilatation is seen. No hilar or mediastinal adenopathy is noted. The visualized upper abdomen shows a hypodense lesion within the left adrenal gland measuring 3.1 cm. This is stable from 09/05/2013 and likely represents an adenoma.  IMPRESSION: Stable left adrenal lesion likely representing an adenoma.  Patchy ground-glass infiltrate throughout both lungs without focal confluent component. No sizable effusion is seen.   Electronically Signed   By: Inez Catalina M.D.    On: 10/21/2014 15:44    Review of Systems  Constitutional: Negative.   HENT: Negative.   Eyes: Negative.   Respiratory: Positive for cough and sputum production.   Cardiovascular: Negative.   Gastrointestinal: Negative.   Genitourinary: Negative.   Musculoskeletal: Negative.   Skin:       Relates a discolored area on her left great toe. History of injury but no drainage  Neurological:       She does relate problems with numbness and paresthesias related to her diabetic neuropathy  Endo/Heme/Allergies: Negative.   Psychiatric/Behavioral: Negative.    Blood pressure 133/69, pulse 90, temperature 97.3 F (36.3 C), temperature source Axillary, resp. rate 20, height '5\' 2"'  (1.575 m), weight 77.202 kg (170 lb 3.2 oz), last menstrual period 09/18/2014, SpO2 99 %. Physical Exam  Cardiovascular:  DP and PT pulses are fully palpable. Capillary filling time is normal  Musculoskeletal:  Adequate range of motion of the pedal joints. Muscle testing 5 over 5. Some palpable bony prominence around the dorsal aspect of the left great toe joint with pain on motion.  Neurological:  Loss of protective threshold monofilament wire and multiple toes. Proprioception still appears to be intact.  Skin:  The skin is warm dry and supple. Adequate hair growth. An intact dry blood blister is present on the distal medial  aspect of the left great toe. No drainage or underlying ulceration.    Assessment/Plan: Assessment: Dry intact blood blister left great toe. Diabetes with some associated neuropathy. Plan: Removed the dry intact blood blister down to the level of normal skin. The bandaging required. Discussed proper diabetic foot care including close observation for signs or symptoms of infection. We will follow up with the patient outpatient as needed.   Destyn Parfitt W. 10/22/2014, 1:17 PM

## 2014-10-23 LAB — GLUCOSE, CAPILLARY
GLUCOSE-CAPILLARY: 178 mg/dL — AB (ref 65–99)
GLUCOSE-CAPILLARY: 258 mg/dL — AB (ref 65–99)
Glucose-Capillary: 308 mg/dL — ABNORMAL HIGH (ref 65–99)

## 2014-10-23 MED ORDER — PNEUMOCOCCAL VAC POLYVALENT 25 MCG/0.5ML IJ INJ: 0.5000 mL | INJECTION | INTRAMUSCULAR | Status: DC

## 2014-10-23 MED ORDER — NICOTINE 21 MG/24HR TD PT24: 21.0000 mg | MEDICATED_PATCH | Freq: Every day | TRANSDERMAL | Status: DC

## 2014-10-23 MED ORDER — INFLUENZA VAC SPLIT QUAD 0.5 ML IM SUSY: 0.5000 mL | PREFILLED_SYRINGE | INTRAMUSCULAR | Status: DC

## 2014-10-23 MED ORDER — METFORMIN HCL 500 MG PO TABS: 500.0000 mg | ORAL_TABLET | Freq: Two times a day (BID) | ORAL | Status: DC

## 2014-10-23 MED ORDER — INSULIN GLARGINE 100 UNIT/ML ~~LOC~~ SOLN: 25.0000 [IU] | Freq: Two times a day (BID) | SUBCUTANEOUS | Status: DC

## 2014-10-23 MED ORDER — INSULIN ASPART 100 UNIT/ML ~~LOC~~ SOLN: 10.0000 [IU] | Freq: Three times a day (TID) | SUBCUTANEOUS | Status: DC

## 2014-10-23 MED ORDER — TIOTROPIUM BROMIDE MONOHYDRATE 18 MCG IN CAPS: 18.0000 ug | ORAL_CAPSULE | Freq: Every day | RESPIRATORY_TRACT | Status: DC

## 2014-10-23 MED ORDER — ALBUTEROL SULFATE HFA 108 (90 BASE) MCG/ACT IN AERS: 2.0000 | INHALATION_SPRAY | RESPIRATORY_TRACT | Status: DC | PRN

## 2014-10-23 MED ORDER — OXYCODONE-ACETAMINOPHEN 5-325 MG PO TABS: 1.0000 | ORAL_TABLET | Freq: Four times a day (QID) | ORAL | Status: DC | PRN

## 2014-10-23 MED ORDER — GUAIFENESIN-DM 100-10 MG/5ML PO SYRP: 10.0000 mL | ORAL_SOLUTION | Freq: Four times a day (QID) | ORAL | Status: DC | PRN

## 2014-10-23 MED ORDER — FLUTICASONE-SALMETEROL 115-21 MCG/ACT IN AERO: 2.0000 | INHALATION_SPRAY | Freq: Two times a day (BID) | RESPIRATORY_TRACT | Status: DC

## 2014-10-23 MED ORDER — PREDNISONE 10 MG (21) PO TBPK: 10.0000 mg | ORAL_TABLET | Freq: Every day | ORAL | Status: DC

## 2014-10-23 MED ORDER — GABAPENTIN 400 MG PO CAPS: 800.0000 mg | ORAL_CAPSULE | Freq: Three times a day (TID) | ORAL | Status: DC

## 2014-10-23 NOTE — Care Management (Signed)
Will be discharged to home today. Ms. Katie Guzman has no insurance listed. Will give information about [facility] and [facility], Encouraged calling the [facility] for an appointment. Will update Katie Guzman at the [facility]. Will give information about Medication Management. Will give glucometer, lancets, and strips.  Will discuss [place] program with Ms. Katie Guzman. Shelbie Ammons RN MSN Care Management [phone]

## 2014-10-23 NOTE — Evaluation (Signed)
Physical Therapy Evaluation Patient Details Name: Katie Guzman MRN: [id number] DOB: May 19, 1970 Today's Date: 10/23/2014   History of Present Illness  presented to ER secondary to SOB, wheezing, cough x2-3 days; admitted for acute hospitalization secondary to COPD, PNA.  Of note, patient with recent hospitalization (discharge approx 4-5 days prior) at [facility] with similar diagnosis.  Clinical Impression  Upon evaluation, patient alert and oriented to all information; follows all commands and demonstrates good insight/safety awareness.  Bilat UE/LE strength and ROM grossly WFL and symmetrical.  Able to complete bed mobility indep; sit/stand, basic transfers without assist device, mod indep; gait x150' without assist device, cga/close sup.  Very slowed cadence and gait speed (10' walk time, 13 seconds) with slightly unsteady gait performance; improved with use of IV pole (to simulate SPC).  Recommend continued use of SPC at this time to maximize safety/indep with functional activities. Maintains sats >93-94% on RA at rest and with exertion throughout session. Would benefit from skilled PT to address above deficits and promote optimal return to PLOF; Recommend transition to [place] upon discharge from acute hospitalization.     Follow Up Recommendations Home health PT    Equipment Recommendations       Recommendations for Other Services       Precautions / Restrictions Precautions Precautions: Fall Restrictions Weight Bearing Restrictions: No      Mobility  Bed Mobility Overal bed mobility: Independent                Transfers Overall transfer level: Modified independent               General transfer comment: sit/stand, basic transfers without assist device  Ambulation/Gait Ambulation/Gait assistance: Min guard Ambulation Distance (Feet): 150 Feet Assistive device: None   Gait velocity: 10' walk time, 13 seconds; significantly decreased compared to age-matched norms;  indicative of increased fall risk. Gait velocity interpretation: <1.8 ft/sec, indicative of risk for recurrent falls General Gait Details: broad BOS with short, staggering steps at times.  Veers laterally at times, self-corrects with LE step strategy vs. reaching for walls/railings.  Maintains sats >93-94% on RA with exertion.  Stairs            Wheelchair Mobility    Modified Rankin (Stroke Patients Only)       Balance Overall balance assessment: Needs assistance Sitting-balance support: No upper extremity supported;Feet supported Sitting balance-Leahy Scale: Good     Standing balance support: No upper extremity supported Standing balance-Leahy Scale: Fair                               Pertinent Vitals/Pain Pain Assessment: No/denies pain ("sore all over")    Home Living Family/patient expects to be discharged to:: Private residence Living Arrangements: Children (daughter and grandchildren) Available Help at Discharge: Family Type of Home: House Home Access: Stairs to enter Entrance Stairs-Rails: None Technical brewer of Steps: 3 Home Layout: One level Home Equipment: Cane - single point      Prior Function Level of Independence: Independent         Comments: Indep with household activities; intermittent use of SPC when back bothers her.  Is caregiver for grandchildren when daughter working.  Denies fall history.  Intermittent use of home O2 (patient self-adjusts based on how she's feeling that day)     Hand Dominance        Extremity/Trunk Assessment   Upper Extremity Assessment: Overall WFL for tasks  assessed           Lower Extremity Assessment: Overall WFL for tasks assessed         Communication   Communication: No difficulties  Cognition Arousal/Alertness: Awake/alert Behavior During Therapy: WFL for tasks assessed/performed Overall Cognitive Status: Within Functional Limits for tasks assessed                       General Comments      Exercises Other Exercises Other Exercises: Gait x50' with IV pole (to simulate SPC), cga/close sup--improved step height/length, gait fluidity and overall safety.  Recommend continued use of SPC at this time until patient regains strength/endurance after hospitalization. Patient aware and in agreement. (8 min).      Assessment/Plan    PT Assessment Patient needs continued PT services  PT Diagnosis Difficulty walking;Generalized weakness   PT Problem List Decreased activity tolerance;Decreased balance;Decreased mobility;Cardiopulmonary status limiting activity  PT Treatment Interventions DME instruction;Stair training;Gait training;Functional mobility training;Therapeutic activities;Therapeutic exercise;Balance training;Patient/family education   PT Goals (Current goals can be found in the Care Plan section) Acute Rehab PT Goals Patient Stated Goal: "to get myself better" PT Goal Formulation: With patient Time For Goal Achievement: 11/06/14 Potential to Achieve Goals: Good    Frequency Min 2X/week   Barriers to discharge Decreased caregiver support      Co-evaluation               End of Session Equipment Utilized During Treatment: Gait belt Activity Tolerance: Patient tolerated treatment well Patient left: in bed;with call bell/phone within reach Nurse Communication: Mobility status (O2 response to activity)         Time: 5361-4431 PT Time Calculation (min) (ACUTE ONLY): 19 min   Charges:   PT Evaluation $Initial PT Evaluation Tier I: 1 Procedure PT Treatments $Gait Training: 8-22 mins   PT G Codes:        Kache Mcclurg H. Owens Shark, PT, DPT, NCS 10/23/2014, 2:45 PM [phone]

## 2014-10-23 NOTE — Discharge Instructions (Signed)
°  Stop smoking Follow-up with primary care physician in a week Follow-up with pulmonology in a week Activity as tolerated Diet-diabetic Outpatient follow-up with diabetic coordinator in 3 days    Chronic Obstructive Pulmonary Disease Exacerbation  Chronic obstructive pulmonary disease (COPD) is a common lung problem. In COPD, the flow of air from the lungs is limited. COPD exacerbations are times that breathing gets worse and you need extra treatment. Without treatment they can be life threatening. If they happen often, your lungs can become more damaged. HOME CARE  Do not smoke.  Avoid tobacco smoke and other things that bother your lungs.  If given, take your antibiotic medicine as told. Finish the medicine even if you start to feel better.  Only take medicines as told by your doctor.  Drink enough fluids to keep your pee (urine) clear or pale yellow (unless your doctor has told you not to).  Use a cool mist machine (vaporizer).  If you use oxygen or a machine that turns liquid medicine into a mist (nebulizer), continue to use them as told.  Keep up with shots (vaccinations) as told by your doctor.  Exercise regularly.  Eat healthy foods.  Keep all doctor visits as told. GET HELP RIGHT AWAY IF:  You are very short of breath and it gets worse.  You have trouble talking.  You have bad chest pain.  You have blood in your spit (sputum).  You have a fever.  You keep throwing up (vomiting).  You feel weak, or you pass out (faint).  You feel confused.  You keep getting worse. MAKE SURE YOU:   Understand these instructions.  Will watch your condition.  Will get help right away if you are not doing well or get worse. Document Released: 01/15/2011 Document Revised: 11/16/2012 Document Reviewed: 09/30/2012 [facility] Patient Information 2015 [place], [place]. This information is not intended to replace advice given to you by your health care provider. Make sure you  discuss any questions you have with your health care provider.

## 2014-10-23 NOTE — Plan of Care (Signed)
Problem: Discharge Progression Outcomes Goal: Complications resolved/controlled Outcome: Progressing Plan of care progress to goals:  1. No c/o pain. Resting comfortably in bed.  2. Hemodynamically:             -VSS, afebrile              -IVF infusing as ordered             -Nebs Q4H             -Blood sugar remained high throughout the day-- Diabetes Coordinator consulted; 248 at bedtime 3. Tolerating Carb/Heart healthy diet, continues to remain very hungry throughout shift despite multiple snacks provided 4. Low fall risk. Independently ambulates safely in room. Pt understands how to use call system for assistance.

## 2014-11-02 NOTE — Discharge Summary (Cosign Needed)
[facility] [place] NAME: Katie Guzman    MR#:  [id number]  DATE OF BIRTH:  1970/08/01  DATE OF ADMISSION:  10/19/2014 ADMITTING PHYSICIAN: Henreitta Leber, MD  DATE OF DISCHARGE: 10/23/2014  6:20 PM  PRIMARY CARE PHYSICIAN: No PCP Per Patient    ADMISSION DIAGNOSIS:  COPD with acute exacerbation [J44.1] Acute on chronic respiratory failure with hypoxia [J96.21]  DISCHARGE DIAGNOSIS:  Active Problems:   COPD exacerbation   SECONDARY DIAGNOSIS:   Past Medical History  Diagnosis Date  . Diabetes mellitus   . COPD (chronic obstructive pulmonary disease)   . Emphysema   . Neuropathy   . Fibromyalgia     HOSPITAL COURSE:   44 year old female with past medical history of diabetes, COPD, fibromyalgia, chronic pain, diabetic neuropathy who presented to the hospital with worsening shortness of breath cough and noted to be in COPD exacerbation.  #1 COPD exacerbation with ch hypoxia-continues to smoke  likely cause of patient's worsening shortness of breath, wheezing and cough. -Patient is noncompliant with her meds and also has possibly smoking and acute bronchitis has led to this exacerbation. -Tapered IV to by mouth steroids, around-the-clock DuoNeb's, continue Dulera, Spiriva. -Given Levaquin. - Appreciate pulmonology's recommendations -Continue 2 L of oxygen via nasal cannula, patient lives on 2 L of oxygen at home  #2 Atypical pneumonia/ a bronchitis-likely community-acquired. -Given IV Levaquin for a total of 5 days  #3 diabetes type 2 with hyperglycemia-uncontrolled with hemoglobin A1c at 11.1 Pt is noncompliant with meds and food continue metformin, sliding scale insulin Lantus dose is increased to 25units subcutaneously twice a day NovoLog 10 units 3 times a day with meals Appreciate diabetic coordinator   #4 diabetic neuropathy-continue Neurontin  #5 chronic pain/fibromyalgia-patient has high drug-seeking  behavior per admitting physician's note . When necessary tramadol for now.  Pain meds prn    #6 dysuria-better Patient was given  Levofloxacin which was d/ced at d/c Urine culture with no growth in 24 hours  #7 tobacco abuse disorder Counseled patient to quit smoking for 3-5 minutes provided nicotine patch Patient is considering to quit smoking.  #8 left great toe -not healing bruise Status post bedside debridement and dressing Appreciate Dr. Olin Pia recommendations  DISCHARGE CONDITIONS:   fair  CONSULTS OBTAINED:  Treatment Team:  Allyne Gee, MD Sharlotte Alamo, MD   PROCEDURES bed side debridment of nonhealing bruise by podiatry  DRUG ALLERGIES:   Allergies  Allergen Reactions  . Darvocet [Propoxyphene N-Acetaminophen] Hives and Nausea And Vomiting  . Flexeril [Cyclobenzaprine] Hives and Nausea And Vomiting  . Toradol [Ketorolac Tromethamine] Nausea And Vomiting  . Vicodin [Hydrocodone-Acetaminophen] Hives and Nausea And Vomiting    DISCHARGE MEDICATIONS:   Discharge Medication List as of 10/23/2014  2:39 PM    START taking these medications   Details  guaiFENesin-dextromethorphan (ROBITUSSIN DM) 100-10 MG/5ML syrup Take 10 mLs by mouth every 6 (six) hours as needed for cough., Starting 10/23/2014, Until Discontinued, OTC    Influenza vac split quadrivalent PF (FLUARIX) 0.5 ML injection Inject 0.5 mLs into the muscle tomorrow at 10 am., Starting 10/23/2014, Normal    insulin aspart (NOVOLOG) 100 UNIT/ML injection Inject 10 Units into the skin 3 (three) times daily with meals., Starting 10/23/2014, Until Discontinued, Print    oxyCODONE-acetaminophen (PERCOCET/ROXICET) 5-325 MG per tablet Take 1 tablet by mouth every 6 (six) hours as needed for severe pain., Starting 10/23/2014, Until Discontinued, Print    pneumococcal 23 valent  vaccine (PNU-IMMUNE) 25 MCG/0.5ML injection Inject 0.5 mLs into the muscle tomorrow at 10 am., Starting 10/23/2014, Normal    predniSONE  (STERAPRED UNI-PAK 21 TAB) 10 MG (21) TBPK tablet Take 1 tablet (10 mg total) by mouth daily. Take 6 tablets by mouth for 1 day followed by  5 tablets by mouth for 1 day followed by  4 tablets by mouth for 1 day followed by  3 tablets by mouth for 1 day followed by  2 tablets by mouth for 1 day foll owed by  1 tablet by mouth for a day and stop, Starting 10/23/2014, Until Discontinued, Print      CONTINUE these medications which have CHANGED   Details  albuterol (PROVENTIL HFA;VENTOLIN HFA) 108 (90 BASE) MCG/ACT inhaler Inhale 2 puffs into the lungs every 4 (four) hours as needed for wheezing or shortness of breath. For wheeze or shortness of breath, Starting 10/23/2014, Until Discontinued, Print    fluticasone-salmeterol (ADVAIR HFA) 115-21 MCG/ACT inhaler Inhale 2 puffs into the lungs 2 (two) times daily., Starting 10/23/2014, Until Discontinued, Print    gabapentin (NEURONTIN) 400 MG capsule Take 2 capsules (800 mg total) by mouth 3 (three) times daily., Starting 10/23/2014, Until Discontinued, Print    insulin glargine (LANTUS) 100 UNIT/ML injection Inject 0.25 mLs (25 Units total) into the skin 2 (two) times daily., Starting 10/23/2014, Until Discontinued, Print    metFORMIN (GLUCOPHAGE) 500 MG tablet Take 1 tablet (500 mg total) by mouth 2 (two) times daily with a meal., Starting 10/23/2014, Until Discontinued, Print    nicotine (NICODERM CQ - DOSED IN MG/24 HOURS) 21 mg/24hr patch Place 1 patch (21 mg total) onto the skin daily., Starting 10/23/2014, Until Discontinued, Print    tiotropium (SPIRIVA) 18 MCG inhalation capsule Place 1 capsule (18 mcg total) into inhaler and inhale daily., Starting 10/23/2014, Until Discontinued, Print      CONTINUE these medications which have NOT CHANGED   Details  acetaminophen (TYLENOL) 325 MG tablet Take 650 mg by mouth every 6 (six) hours as needed for mild pain., Until Discontinued, Historical Med    albuterol (PROVENTIL) (2.5 MG/3ML) 0.083% nebulizer  solution Take 2.5 mg by nebulization every 4 (four) hours as needed for wheezing or shortness of breath. , Until Discontinued, Historical Med    ibuprofen (ADVIL,MOTRIN) 600 MG tablet Take 600 mg by mouth every 6 (six) hours as needed for fever or mild pain., Until Discontinued, Historical Med    lidocaine (LIDODERM) 5 % Place 1 patch onto the skin daily. Remove & Discard patch within 12 hours or as directed by MD, Until Discontinued, Historical Med    ondansetron (ZOFRAN) 4 MG tablet Take 1 tablet (4 mg total) by mouth every 8 (eight) hours as needed for nausea or vomiting., Starting 08/03/2014, Until Discontinued, Print      STOP taking these medications     guaiFENesin-codeine 100-10 MG/5ML syrup      insulin NPH Human (HUMULIN N,NOVOLIN N) 100 UNIT/ML injection      amoxicillin (AMOXIL) 500 MG capsule      metroNIDAZOLE (FLAGYL) 500 MG tablet          DISCHARGE INSTRUCTIONS:  F/iu with pcp in a week F/u with pulmonology as recommended   DIET:  diabetic  DISCHARGE CONDITION:  satisfactory  ACTIVITY:  As tolerated  OXYGEN:  Home Oxygen: yes   Oxygen Delivery: 2 lit o2 via [place] DISCHARGE LOCATION:  Home   If you experience worsening of your admission symptoms, develop shortness of  breath, life threatening emergency, suicidal or homicidal thoughts you must seek medical attention immediately by calling 911 or calling your MD immediately  if symptoms less severe.  You Must read complete instructions/literature along with all the possible adverse reactions/side effects for all the Medicines you take and that have been prescribed to you. Take any new Medicines after you have completely understood and accpet all the possible adverse reactions/side effects.   Please note  You were cared for by a hospitalist during your hospital stay. If you have any questions about your discharge medications or the care you received while you were in the hospital after you are discharged, you  can call the unit and asked to speak with the hospitalist on call if the hospitalist that took care of you is not available. Once you are discharged, your primary care physician will handle any further medical issues. Please note that NO REFILLS for any discharge medications will be authorized once you are discharged, as it is imperative that you return to your primary care physician (or establish a relationship with a primary care physician if you do not have one) for your aftercare needs so that they can reassess your need for medications and monitor your lab values.     Today  Chief Complaint  Patient presents with  . Shortness of Breath   Feeling better , SOB at her baseline. No new complaints  ROS:  CONSTITUTIONAL: Denies fevers, chills. Denies any fatigue, weakness.  EYES: Denies blurry vision, double vision, eye pain. EARS, NOSE, THROAT: Denies tinnitus, ear pain, hearing loss. RESPIRATORY: Denies cough, wheeze, shortness of breath.  CARDIOVASCULAR: Denies chest pain, palpitations, edema.  GASTROINTESTINAL: Denies nausea, vomiting, diarrhea, abdominal pain. Denies bright red blood per rectum. GENITOURINARY: Denies dysuria, hematuria. ENDOCRINE: Denies nocturia or thyroid problems. HEMATOLOGIC AND LYMPHATIC: Denies easy bruising or bleeding. SKIN: Denies rash or lesion. MUSCULOSKELETAL: Denies pain in neck, back, shoulder, knees, hips or arthritic symptoms.  NEUROLOGIC: Denies paralysis, paresthesias.  PSYCHIATRIC: Denies anxiety or depressive symptoms.   VITAL SIGNS:  Blood pressure 145/92, pulse 91, temperature 98 F (36.7 C), temperature source Oral, resp. rate 19, height 5\' 2"  (1.575 m), weight 77.202 kg (170 lb 3.2 oz), last menstrual period 09/18/2014, SpO2 97 %.  I/O:  No intake or output data in the 24 hours ending 11/02/14 1817  PHYSICAL EXAMINATION:  GENERAL:  44 y.o.-year-old patient lying in the bed with no acute distress.  EYES: Pupils equal, round, reactive to  light and accommodation. No scleral icterus. Extraocular muscles intact.  HEENT: Head atraumatic, normocephalic. Oropharynx and nasopharynx clear.  NECK:  Supple, no jugular venous distention. No thyroid enlargement, no tenderness.  LUNGS: Normal breath sounds bilaterally, no wheezing, rales,rhonchi or crepitation. No use of accessory muscles of respiration.  CARDIOVASCULAR: S1, S2 normal. No murmurs, rubs, or gallops.  ABDOMEN: Soft, non-tender, non-distended. Bowel sounds present. No organomegaly or mass.  EXTREMITIES: No pedal edema, cyanosis, or clubbing.  NEUROLOGIC: Cranial nerves II through XII are intact. Muscle strength 5/5 in all extremities. Sensation intact. Gait not checked.  PSYCHIATRIC: The patient is alert and oriented x 3.  SKIN: No obvious rash, lesion, or ulcer.   DATA REVIEW:   CBC No results for input(s): WBC, HGB, HCT, PLT in the last 168 hours.  Chemistries  No results for input(s): NA, K, CL, CO2, GLUCOSE, BUN, CREATININE, CALCIUM, MG, AST, ALT, ALKPHOS, BILITOT in the last 168 hours.  Invalid input(s): GFRCGP  Cardiac Enzymes No results for input(s): TROPONINI in  the last 168 hours.  Microbiology Results  Results for orders placed or performed during the hospital encounter of 10/19/14  Urine culture     Status: None   Collection Time: 10/20/14  4:23 PM  Result Value Ref Range Status   Specimen Description URINE, RANDOM  Final   Special Requests URINE, RANDOM  Final   Culture MULTIPLE SPECIES PRESENT, SUGGEST RECOLLECTION  Final   Report Status 10/22/2014 FINAL  Final    RADIOLOGY:  No results found.  EKG:   Orders placed or performed during the hospital encounter of 10/19/14  . EKG 12-Lead  . EKG 12-Lead  . EKG      Management plans discussed with the patient, family and they are in agreement.  CODE STATUS:   TOTAL TIME TAKING CARE OF THIS PATIENT: 45 minutes.    @[facility] @  on 11/02/2014 at 6:17 PM  Between 7am to 6pm - Pager -  [phone]  After 6pm go to [email or web address] - password [email or web address] [facility]  [facility]  Office  [phone]  CC: Primary care physician; No PCP Per Patient

## 2014-11-18 ENCOUNTER — Emergency Department: Payer: Medicaid Other

## 2014-11-18 ENCOUNTER — Emergency Department
Admission: EM | Admit: 2014-11-18 | Discharge: 2014-11-18 | Disposition: A | Discharge status: Home / Self Care | Payer: Medicaid Other | Attending: Emergency Medicine | Admitting: Emergency Medicine

## 2014-11-18 ENCOUNTER — Encounter: Payer: Self-pay | Admitting: Emergency Medicine

## 2014-11-18 DIAGNOSIS — S92351A Displaced fracture of fifth metatarsal bone, right foot, initial encounter for closed fracture: Secondary | ICD-10-CM | POA: Diagnosis not present

## 2014-11-18 DIAGNOSIS — R739 Hyperglycemia, unspecified: Secondary | ICD-10-CM

## 2014-11-18 DIAGNOSIS — M5430 Sciatica, unspecified side: Secondary | ICD-10-CM | POA: Insufficient documentation

## 2014-11-18 DIAGNOSIS — Z794 Long term (current) use of insulin: Secondary | ICD-10-CM | POA: Diagnosis not present

## 2014-11-18 DIAGNOSIS — E1165 Type 2 diabetes mellitus with hyperglycemia: Secondary | ICD-10-CM | POA: Diagnosis not present

## 2014-11-18 DIAGNOSIS — Y9289 Other specified places as the place of occurrence of the external cause: Secondary | ICD-10-CM | POA: Diagnosis not present

## 2014-11-18 DIAGNOSIS — S92301A Fracture of unspecified metatarsal bone(s), right foot, initial encounter for closed fracture: Secondary | ICD-10-CM

## 2014-11-18 DIAGNOSIS — S3992XA Unspecified injury of lower back, initial encounter: Secondary | ICD-10-CM | POA: Diagnosis not present

## 2014-11-18 DIAGNOSIS — W1839XA Other fall on same level, initial encounter: Secondary | ICD-10-CM | POA: Insufficient documentation

## 2014-11-18 DIAGNOSIS — Z720 Tobacco use: Secondary | ICD-10-CM | POA: Insufficient documentation

## 2014-11-18 DIAGNOSIS — H1132 Conjunctival hemorrhage, left eye: Secondary | ICD-10-CM | POA: Insufficient documentation

## 2014-11-18 DIAGNOSIS — S99921A Unspecified injury of right foot, initial encounter: Secondary | ICD-10-CM | POA: Diagnosis present

## 2014-11-18 DIAGNOSIS — S0990XA Unspecified injury of head, initial encounter: Secondary | ICD-10-CM | POA: Diagnosis not present

## 2014-11-18 DIAGNOSIS — J441 Chronic obstructive pulmonary disease with (acute) exacerbation: Secondary | ICD-10-CM | POA: Diagnosis not present

## 2014-11-18 DIAGNOSIS — Y9389 Activity, other specified: Secondary | ICD-10-CM | POA: Diagnosis not present

## 2014-11-18 DIAGNOSIS — Z79899 Other long term (current) drug therapy: Secondary | ICD-10-CM | POA: Diagnosis not present

## 2014-11-18 DIAGNOSIS — Z7951 Long term (current) use of inhaled steroids: Secondary | ICD-10-CM | POA: Insufficient documentation

## 2014-11-18 DIAGNOSIS — W19XXXA Unspecified fall, initial encounter: Secondary | ICD-10-CM

## 2014-11-18 DIAGNOSIS — Y998 Other external cause status: Secondary | ICD-10-CM | POA: Diagnosis not present

## 2014-11-18 LAB — URINALYSIS COMPLETE WITH MICROSCOPIC (ARMC ONLY)
BILIRUBIN URINE: NEGATIVE
Hgb urine dipstick: NEGATIVE
Ketones, ur: NEGATIVE mg/dL
Leukocytes, UA: NEGATIVE
Nitrite: NEGATIVE
PH: 6 (ref 5.0–8.0)
Protein, ur: NEGATIVE mg/dL
Specific Gravity, Urine: 1.036 — ABNORMAL HIGH (ref 1.005–1.030)

## 2014-11-18 LAB — CBC
HEMATOCRIT: 42.9 % (ref 35.0–47.0)
HEMOGLOBIN: 14.2 g/dL (ref 12.0–16.0)
MCH: 28.6 pg (ref 26.0–34.0)
MCHC: 33.2 g/dL (ref 32.0–36.0)
MCV: 86.3 fL (ref 80.0–100.0)
Platelets: 309 10*3/uL (ref 150–440)
RBC: 4.98 MIL/uL (ref 3.80–5.20)
RDW: 13.7 % (ref 11.5–14.5)
WBC: 10.1 10*3/uL (ref 3.6–11.0)

## 2014-11-18 LAB — COMPREHENSIVE METABOLIC PANEL
ALBUMIN: 3.5 g/dL (ref 3.5–5.0)
ALK PHOS: 135 U/L — AB (ref 38–126)
ALT: 17 U/L (ref 14–54)
ANION GAP: 8 (ref 5–15)
AST: 20 U/L (ref 15–41)
BILIRUBIN TOTAL: 0.1 mg/dL — AB (ref 0.3–1.2)
BUN: 9 mg/dL (ref 6–20)
CALCIUM: 8.9 mg/dL (ref 8.9–10.3)
CO2: 28 mmol/L (ref 22–32)
CREATININE: 0.73 mg/dL (ref 0.44–1.00)
Chloride: 99 mmol/L — ABNORMAL LOW (ref 101–111)
GFR calc Af Amer: >60 mL/min (ref >60)
GFR calc non Af Amer: >60 mL/min (ref >60)
GLUCOSE: 407 mg/dL — AB (ref 65–99)
Potassium: 4 mmol/L (ref 3.5–5.1)
Sodium: 135 mmol/L (ref 135–145)
TOTAL PROTEIN: 6.8 g/dL (ref 6.5–8.1)

## 2014-11-18 LAB — URINE DRUG SCREEN, QUALITATIVE (ARMC ONLY)
AMPHETAMINES, UR SCREEN: NOT DETECTED
BENZODIAZEPINE, UR SCRN: NOT DETECTED
Barbiturates, Ur Screen: NOT DETECTED
COCAINE METABOLITE, UR ~~LOC~~: NOT DETECTED
Cannabinoid 50 Ng, Ur ~~LOC~~: NOT DETECTED
MDMA (Ecstasy)Ur Screen: NOT DETECTED
Methadone Scn, Ur: NOT DETECTED
OPIATE, UR SCREEN: POSITIVE — AB
Phencyclidine (PCP) Ur S: NOT DETECTED
Tricyclic, Ur Screen: NOT DETECTED

## 2014-11-18 LAB — GLUCOSE, CAPILLARY: Glucose-Capillary: 288 mg/dL — ABNORMAL HIGH (ref 65–99)

## 2014-11-18 MED ORDER — OXYCODONE-ACETAMINOPHEN 5-325 MG PO TABS
1.0000 | ORAL_TABLET | Freq: Once | ORAL | Status: DC
Start: 2014-11-18 — End: 2014-11-18

## 2014-11-18 MED ORDER — INSULIN ASPART 100 UNIT/ML ~~LOC~~ SOLN
6.0000 [IU] | Freq: Once | SUBCUTANEOUS | Status: AC
  Administered 2014-11-18: 6 [IU] via INTRAVENOUS
  Filled 2014-11-18: qty 0.06

## 2014-11-18 MED ORDER — OXYCODONE-ACETAMINOPHEN 5-325 MG PO TABS: 1.0000 | ORAL_TABLET | Freq: Four times a day (QID) | ORAL | Status: DC | PRN

## 2014-11-18 MED ORDER — MORPHINE SULFATE (PF) 4 MG/ML IV SOLN
4.0000 mg | Freq: Once | INTRAVENOUS | Status: AC
Start: 2014-11-18 — End: 2014-11-18
  Administered 2014-11-18: 4 mg via INTRAVENOUS
  Filled 2014-11-18: qty 1

## 2014-11-18 MED ORDER — MORPHINE SULFATE (PF) 4 MG/ML IV SOLN
4.0000 mg | Freq: Once | INTRAVENOUS | Status: AC
  Administered 2014-11-18: 4 mg via INTRAVENOUS
  Filled 2014-11-18: qty 1

## 2014-11-18 NOTE — Discharge Instructions (Signed)
You have been seen in the emergency department following a fall. Her workup is consistent with a foot fracture. Please use your hard sole shoe, crutches as needed, and follow-up with orthopedics in 1 week by calling the number provided and arranging an appointment. He may take your pain medication as needed, as written. Please closely monitor your blood glucose levels at home, drink plenty of non-sugary fluids, and follow up with her primary care physician tomorrow to recheck blood glucose levels and discussed her current diabetic regimen. Return to the emergency department for any worsening pain, or any other symptom personally concerning to yourself.    Metatarsal Fracture A metatarsal fracture is a break in a metatarsal bone. Metatarsal bones connect your toe bones to your ankle bones. CAUSES This type of fracture may be caused by:  A sudden twisting of your foot.  A fall onto your foot.  Overuse or repetitive exercise. RISK FACTORS This condition is more likely to develop in people who:  Play contact sports.  Have a bone disease.  Have a low calcium level. SYMPTOMS Symptoms of this condition include:  Pain that is worse when walking or standing.  Pain when pressing on the foot or moving the toes.  Swelling.  Bruising on the top or bottom of the foot.  A foot that appears shorter than the other one. DIAGNOSIS This condition is diagnosed with a physical exam. You may also have imaging tests, such as:  X-rays.  A CT scan.  MRI. TREATMENT Treatment for this condition depends on its severity and whether a bone has moved out of place. Treatment may involve:  Rest.  Wearing foot support such as a cast, splint, or boot for several weeks.  Using crutches.  Surgery to move bones back into the right position. Surgery is usually needed if there are many pieces of broken bone or bones that are very out of place (displaced fracture).  Physical therapy. This may be needed to  help you regain full movement and strength in your foot. You will need to return to your health care provider to have X-rays taken until your bones heal. Your health care provider will look at the X-rays to make sure that your foot is healing well. HOME CARE INSTRUCTIONS  If You Have a Cast:  Do not stick anything inside the cast to scratch your skin. Doing that increases your risk of infection.  Check the skin around the cast every day. Report any concerns to your health care provider. You may put lotion on dry skin around the edges of the cast. Do not apply lotion to the skin underneath the cast.  Keep the cast clean and dry. If You Have a Splint or a Supportive Boot:  Wear it as directed by your health care provider. Remove it only as directed by your health care provider.  Loosen it if your toes become numb and tingle, or if they turn cold and blue.  Keep it clean and dry. Bathing  Do not take baths, swim, or use a hot tub until your health care provider approves. Ask your health care provider if you can take showers. You may only be allowed to take sponge baths for bathing.  If your health care provider approves bathing and showering, cover the cast or splint with a watertight plastic bag to protect it from water. Do not let the cast or splint get wet. Managing Pain, Stiffness, and Swelling  If directed, apply ice to the injured area (if you  have a splint, not a cast).  Put ice in a plastic bag.  Place a towel between your skin and the bag.  Leave the ice on for 20 minutes, 2-3 times per day.  Move your toes often to avoid stiffness and to lessen swelling.  Raise (elevate) the injured area above the level of your heart while you are sitting or lying down. Driving  Do not drive or operate heavy machinery while taking pain medicine.  Do not drive while wearing foot support on a foot that you use for driving. Activity  Return to your normal activities as directed by your  health care provider. Ask your health care provider what activities are safe for you.  Perform exercises as directed by your health care provider or physical therapist. Safety  Do not use the injured foot to support your body weight until your health care provider says that you can. Use crutches as directed by your health care provider. General Instructions  Do not put pressure on any part of the cast or splint until it is fully hardened. This may take several hours.  Do not use any tobacco products, including cigarettes, chewing tobacco, or e-cigarettes. Tobacco can delay bone healing. If you need help quitting, ask your health care provider.  Take medicines only as directed by your health care provider.  Keep all follow-up visits as directed by your health care provider. This is important. SEEK MEDICAL CARE IF:  You have a fever.  Your cast, splint, or boot is too loose or too tight.  Your cast, splint, or boot is damaged.  Your pain medicine is not helping.  You have pain, tingling, or numbness in your foot that is not going away. SEEK IMMEDIATE MEDICAL CARE IF:  You have severe pain.  You have tingling or numbness in your foot that is getting worse.  Your foot feels cold or becomes numb.  Your foot changes color.   This information is not intended to replace advice given to you by your health care provider. Make sure you discuss any questions you have with your health care provider.   Document Released: 10/18/2001 Document Revised: 06/12/2014 Document Reviewed: 11/22/2013 Elsevier Interactive Patient Education 2016 [employer]  Hyperglycemia High blood sugar (hyperglycemia) means that the level of sugar in your blood is higher than it should be. Signs of high blood sugar include:  Feeling thirsty.  Frequent peeing (urinating).  Feeling tired or sleepy.  Dry mouth.  Vision changes.  Feeling weak.  Feeling hungry but losing weight.  Numbness and tingling  in your hands or feet.  Headache. When you ignore these signs, your blood sugar may keep going up. These problems may get worse, and other problems may begin. HOME CARE  Check your blood sugars as told by your doctor. Write down the numbers with the date and time.  Take the right amount of insulin or diabetes pills at the right time. Write down the dose with date and time.  Refill your insulin or diabetes pills before running out.  Watch what you eat. Follow your meal plan.  Drink liquids without sugar, such as water. Check with your doctor if you have kidney or heart disease.  Follow your doctor's orders for exercise. Exercise at the same time of day.  Keep your doctor's appointments. GET HELP RIGHT AWAY IF:   You have trouble thinking or are confused.  You have fast breathing with fruity smelling breath.  You pass out (faint).  You have 2  to 3 days of high blood sugars and you do not know why.  You have chest pain.  You are feeling sick to your stomach (nauseous) or throwing up (vomiting).  You have sudden vision changes. MAKE SURE YOU:   Understand these instructions.  Will watch your condition.  Will get help right away if you are not doing well or get worse.   This information is not intended to replace advice given to you by your health care provider. Make sure you discuss any questions you have with your health care provider.   Document Released: 11/23/2008 Document Revised: 02/16/2014 Document Reviewed: 10/02/2014 Elsevier Interactive Patient Education [employer].

## 2014-11-18 NOTE — ED Provider Notes (Signed)
[facility] Emergency Department Provider Note  Time seen: 4:35 PM  I have reviewed the triage vital signs and the nursing notes.   HISTORY  Chief Complaint Cough; Fall; and Eye Problem    HPI Katie Guzman is a 44 y.o. female with a past medical history of COPD, diabetes, fibromyalgia, chronic pain, presents the emergency department with multiple complaints. According to the patient for the past one week she has had mild left eye swelling, as well as redness of the left eye. She states she has had a continuous cough for the past 2 weeks, with occasional sputum. She states lower back pain, states she always has back pain but it has been much worse over the past 2 weeks. States occasional numbness or shooting pain down the right leg. Also states several recent falls due to her back pain and leg pain. Patient had a fall this morning, and she came to the emergent department for lower back pain, and to be evaluated for all the above symptoms. Patient was admitted to the hospital in early September for appears to be pneumonia, was treated and discharged. Patient states she is going home she has continued to cough with sputum. She does admit tobacco use every day, and prior notes suggest poor medication compliance. In eyes any fever. Does state some numbness and weakness of her right lower extremity for the past one week. Denies any urinary or bladder incontinence issues. Describes her back pain is moderate. Cough is moderate. Denies any pain in her eyes.     Past Medical History  Diagnosis Date  . Diabetes mellitus   . COPD (chronic obstructive pulmonary disease) ([place])   . Emphysema   . Neuropathy ([place])   . Fibromyalgia     Patient Active Problem List   Diagnosis Date Noted  . COPD exacerbation ([place]) 10/19/2014    Past Surgical History  Procedure Laterality Date  . Back surgery    . Tubal ligation    . Cesarean section    . Tonsillectomy      Current  Outpatient Rx  Name  Route  Sig  Dispense  Refill  . acetaminophen (TYLENOL) 325 MG tablet   Oral   Take 650 mg by mouth every 6 (six) hours as needed for mild pain.         Marland Kitchen albuterol (PROVENTIL HFA;VENTOLIN HFA) 108 (90 BASE) MCG/ACT inhaler   Inhalation   Inhale 2 puffs into the lungs every 4 (four) hours as needed for wheezing or shortness of breath. For wheeze or shortness of breath   1 Inhaler   0   . albuterol (PROVENTIL) (2.5 MG/3ML) 0.083% nebulizer solution   Nebulization   Take 2.5 mg by nebulization every 4 (four) hours as needed for wheezing or shortness of breath.          . fluticasone-salmeterol (ADVAIR HFA) 115-21 MCG/ACT inhaler   Inhalation   Inhale 2 puffs into the lungs 2 (two) times daily.   1 Inhaler   0   . gabapentin (NEURONTIN) 400 MG capsule   Oral   Take 2 capsules (800 mg total) by mouth 3 (three) times daily.   180 capsule   0   . guaiFENesin-dextromethorphan (ROBITUSSIN DM) 100-10 MG/5ML syrup   Oral   Take 10 mLs by mouth every 6 (six) hours as needed for cough.   118 mL   0   . ibuprofen (ADVIL,MOTRIN) 600 MG tablet   Oral   Take 600  mg by mouth every 6 (six) hours as needed for fever or mild pain.         . Influenza vac split quadrivalent PF (FLUARIX) 0.5 ML injection   Intramuscular   Inject 0.5 mLs into the muscle tomorrow at 10 am.   0.5 mL   0   . insulin aspart (NOVOLOG) 100 UNIT/ML injection   Subcutaneous   Inject 10 Units into the skin 3 (three) times daily with meals.   1 vial   0   . insulin glargine (LANTUS) 100 UNIT/ML injection   Subcutaneous   Inject 0.25 mLs (25 Units total) into the skin 2 (two) times daily.   1 vial   0   . lidocaine (LIDODERM) 5 %   Transdermal   Place 1 patch onto the skin daily. Remove & Discard patch within 12 hours or as directed by MD         . metFORMIN (GLUCOPHAGE) 500 MG tablet   Oral   Take 1 tablet (500 mg total) by mouth 2 (two) times daily with a meal.   60  tablet   0   . nicotine (NICODERM CQ - DOSED IN MG/24 HOURS) 21 mg/24hr patch   Transdermal   Place 1 patch (21 mg total) onto the skin daily.   28 patch   0   . ondansetron (ZOFRAN) 4 MG tablet   Oral   Take 1 tablet (4 mg total) by mouth every 8 (eight) hours as needed for nausea or vomiting. Patient not taking: Reported on 10/19/2014   10 tablet   0   . oxyCODONE-acetaminophen (PERCOCET/ROXICET) 5-325 MG per tablet   Oral   Take 1 tablet by mouth every 6 (six) hours as needed for severe pain.   20 tablet   0   . pneumococcal 23 valent vaccine (PNU-IMMUNE) 25 MCG/0.5ML injection   Intramuscular   Inject 0.5 mLs into the muscle tomorrow at 10 am.   2.5 mL   0   . predniSONE (STERAPRED UNI-PAK 21 TAB) 10 MG (21) TBPK tablet   Oral   Take 1 tablet (10 mg total) by mouth daily. Take 6 tablets by mouth for 1 day followed by  5 tablets by mouth for 1 day followed by  4 tablets by mouth for 1 day followed by  3 tablets by mouth for 1 day followed by  2 tablets by mouth for 1 day followed by  1 tablet by mouth for a day and stop   21 tablet   0   . tiotropium (SPIRIVA) 18 MCG inhalation capsule   Inhalation   Place 1 capsule (18 mcg total) into inhaler and inhale daily.   30 capsule   0     Allergies Darvocet; Flexeril; Toradol; and Vicodin  Family History  Problem Relation Age of Onset  . Lung cancer Mother     Social History Social History  Substance Use Topics  . Smoking status: Current Some Day Smoker -- 0.50 packs/day for 30 years    Types: Cigarettes    Last Attempt to Quit: 10/05/2014  . Smokeless tobacco: None  . Alcohol Use: No    Review of Systems Constitutional: Negative for fever. Eyes: Positive for swelling of the left eye. ENT: Positive for congestion. Cardiovascular: Negative for chest pain. Respiratory: Positive for shortness of breath and cough with sputum Gastrointestinal: Negative for abdominal pain. Positive intermittent  nausea. Genitourinary: Negative for dysuria. Musculoskeletal: Positive for lower back pain. Positive for right  leg pain. Neurological: Negative for headache 10-point ROS otherwise negative.  ____________________________________________   PHYSICAL EXAM:  VITAL SIGNS: ED Triage Vitals  Enc Vitals Group     BP 11/18/14 1552 108/66 mmHg     Pulse Rate 11/18/14 1552 109     Resp 11/18/14 1552 16     Temp 11/18/14 1552 98.1 F (36.7 C)     Temp Source 11/18/14 1552 Oral     SpO2 11/18/14 1552 96 %     Weight 11/18/14 1552 160 lb (72.576 kg)     Height 11/18/14 1552 5\' 2"  (1.575 m)     Head Cir --      Peak Flow --      Pain Score 11/18/14 1553 9     Pain Loc --      Pain Edu? --      Excl. in [place]? --     Constitutional: Alert and oriented. Well appearing and in no distress. Eyes: Left subconjunctival hemorrhage, EOMI, PERRL, mild amount of left periorbital edema, no erythema or signs of cellulitis. ENT   Head: Mild left periorbital edema, with left subconjunctival hemorrhage   Mouth/Throat: Mucous membranes are moist. Cardiovascular: Normal rate, regular rhythm. No murmur Respiratory: Normal respiratory effort without tachypnea nor retractions. Moderate expiratory wheeze bilaterally. Gastrointestinal: Soft and nontender. No distention.   Musculoskeletal: Moderate lumbar tenderness palpation. States pain in her right lower extremity with movement of the leg. Pelvis is stable, nontender hips, nontender knees, good range of motion but states pain in her lower back at times. Neurologic:  Normal speech and language, patient states subjective sensory deficit and right lower extremity, able to move, but states it makes her back pain worse when she moves. Also states she will have shooting pains down the right leg when she moves her leg. Skin:  Skin is warm, dry and intact.  Psychiatric: Mood and affect are normal.   ____________________________________________   RADIOLOGY  X-ray consistent with right fifth metatarsal fracture  ____________________________________________    INITIAL IMPRESSION / ASSESSMENT AND PLAN / ED COURSE  Pertinent labs & imaging results that were available during my care of the patient were reviewed by me and considered in my medical decision making (see chart for details).  Patient presents the emergency department with multiple complaints including lumbar pain with occasional pain shooting down her right leg, swelling and redness to her left eye, cough with sputum production, shortness of breath. Patient's exam is consistent with left subconjunctival hemorrhage, patient denies trauma. EOMI, [place]. Patient does have moderate wheezes bilaterally, smells of cigarette smoke. Patient appears to be suffering from sciatica of the right lower extremity. 2+ DP pulse, warm bilaterally, no edema or calf tenderness. Given the patient's multiple complaints we will proceed with a chest x-ray, CT head to help rule out CVA, labs, and urinalysis. Patient requesting pain medication.  X-ray shows metatarsal fracture, patient placed in a hard sole shoe and given crutches. Labs show hyperglycemia, after insulin and patient eating her glucose is currently 288. I discussed with the patient need to closely monitor her blood glucose at home. We will prescribe a short course of pain medication have the patient follow up with orthopedics, as well as her primary care physician.  ____________________________________________   FINAL CLINICAL IMPRESSION(S) / ED DIAGNOSES  Lower back pain Left subconjunctival hemorrhage Sciatica COPD   Harvest Dark, MD 11/18/14 2037

## 2014-11-18 NOTE — ED Notes (Addendum)
Pt reports falling while at [employer] center today, states R foot twisted under her when she fell. Pt c/o decreased sensation to foot, unable to move toes. Skin to RLE WNL, cap refill <3s, warm to touch. Pt also noted to have some bruising to L eyelid that she states is from a recent episode of swelling/drainage that has since resolved.

## 2014-11-18 NOTE — ED Notes (Signed)
Pt reports cough for a few weeks, reports left eye swelling for a few days. Pt reports unable to move right leg today, reports falling today.

## 2014-11-30 ENCOUNTER — Ambulatory Visit: Payer: Self-pay

## 2015-04-15 ENCOUNTER — Inpatient Hospital Stay (HOSPITAL_COMMUNITY)
Admission: EM | Admit: 2015-04-15 | Discharge: 2015-04-17 | DRG: 638 | Discharge status: Against Medical Advice | Payer: Medicaid Other | Attending: Internal Medicine | Admitting: Internal Medicine

## 2015-04-15 ENCOUNTER — Emergency Department (HOSPITAL_COMMUNITY): Payer: Medicaid Other

## 2015-04-15 ENCOUNTER — Encounter (HOSPITAL_COMMUNITY): Payer: Self-pay | Admitting: *Deleted

## 2015-04-15 ENCOUNTER — Observation Stay (HOSPITAL_COMMUNITY): Payer: Medicaid Other

## 2015-04-15 DIAGNOSIS — L0390 Cellulitis, unspecified: Secondary | ICD-10-CM

## 2015-04-15 DIAGNOSIS — Z7951 Long term (current) use of inhaled steroids: Secondary | ICD-10-CM

## 2015-04-15 DIAGNOSIS — M659 Synovitis and tenosynovitis, unspecified: Secondary | ICD-10-CM | POA: Diagnosis present

## 2015-04-15 DIAGNOSIS — J42 Unspecified chronic bronchitis: Secondary | ICD-10-CM

## 2015-04-15 DIAGNOSIS — E1140 Type 2 diabetes mellitus with diabetic neuropathy, unspecified: Secondary | ICD-10-CM | POA: Diagnosis present

## 2015-04-15 DIAGNOSIS — E119 Type 2 diabetes mellitus without complications: Secondary | ICD-10-CM

## 2015-04-15 DIAGNOSIS — L03116 Cellulitis of left lower limb: Secondary | ICD-10-CM | POA: Diagnosis present

## 2015-04-15 DIAGNOSIS — F17210 Nicotine dependence, cigarettes, uncomplicated: Secondary | ICD-10-CM | POA: Diagnosis present

## 2015-04-15 DIAGNOSIS — M797 Fibromyalgia: Secondary | ICD-10-CM | POA: Diagnosis present

## 2015-04-15 DIAGNOSIS — E876 Hypokalemia: Secondary | ICD-10-CM | POA: Diagnosis present

## 2015-04-15 DIAGNOSIS — Z794 Long term (current) use of insulin: Secondary | ICD-10-CM

## 2015-04-15 DIAGNOSIS — L03032 Cellulitis of left toe: Secondary | ICD-10-CM

## 2015-04-15 DIAGNOSIS — Z7984 Long term (current) use of oral hypoglycemic drugs: Secondary | ICD-10-CM

## 2015-04-15 DIAGNOSIS — E1165 Type 2 diabetes mellitus with hyperglycemia: Secondary | ICD-10-CM | POA: Diagnosis present

## 2015-04-15 DIAGNOSIS — IMO0001 Reserved for inherently not codable concepts without codable children: Secondary | ICD-10-CM | POA: Insufficient documentation

## 2015-04-15 DIAGNOSIS — E11621 Type 2 diabetes mellitus with foot ulcer: Principal | ICD-10-CM | POA: Diagnosis present

## 2015-04-15 DIAGNOSIS — F0631 Mood disorder due to known physiological condition with depressive features: Secondary | ICD-10-CM | POA: Diagnosis present

## 2015-04-15 DIAGNOSIS — L97529 Non-pressure chronic ulcer of other part of left foot with unspecified severity: Secondary | ICD-10-CM | POA: Diagnosis present

## 2015-04-15 DIAGNOSIS — G894 Chronic pain syndrome: Secondary | ICD-10-CM | POA: Diagnosis present

## 2015-04-15 DIAGNOSIS — E118 Type 2 diabetes mellitus with unspecified complications: Secondary | ICD-10-CM

## 2015-04-15 DIAGNOSIS — J449 Chronic obstructive pulmonary disease, unspecified: Secondary | ICD-10-CM | POA: Diagnosis present

## 2015-04-15 HISTORY — DX: Cellulitis, unspecified: L03.90

## 2015-04-15 HISTORY — DX: Synovitis and tenosynovitis, unspecified: M65.9

## 2015-04-15 LAB — CBC
HCT: 45.1 % (ref 36.0–46.0)
HEMATOCRIT: 41.6 % (ref 36.0–46.0)
HEMOGLOBIN: 13.7 g/dL (ref 12.0–15.0)
Hemoglobin: 14.8 g/dL (ref 12.0–15.0)
MCH: 29.8 pg (ref 26.0–34.0)
MCH: 29.5 pg (ref 26.0–34.0)
MCHC: 32.8 g/dL (ref 30.0–36.0)
MCHC: 32.9 g/dL (ref 30.0–36.0)
MCV: 90.7 fL (ref 78.0–100.0)
MCV: 89.5 fL (ref 78.0–100.0)
PLATELETS: 328 10*3/uL (ref 150–400)
Platelets: 270 10*3/uL (ref 150–400)
RBC: 4.97 MIL/uL (ref 3.87–5.11)
RBC: 4.65 MIL/uL (ref 3.87–5.11)
RDW: 12.9 % (ref 11.5–15.5)
RDW: 12.9 % (ref 11.5–15.5)
WBC: 25.7 10*3/uL — AB (ref 4.0–10.5)
WBC: 25.3 10*3/uL — ABNORMAL HIGH (ref 4.0–10.5)

## 2015-04-15 LAB — CBG MONITORING, ED
GLUCOSE-CAPILLARY: 287 mg/dL — AB (ref 65–99)
Glucose-Capillary: 583 mg/dL (ref 65–99)
Glucose-Capillary: 332 mg/dL — ABNORMAL HIGH (ref 65–99)

## 2015-04-15 LAB — BASIC METABOLIC PANEL
Anion gap: 13 (ref 5–15)
BUN: 9 mg/dL (ref 6–20)
CALCIUM: 9.6 mg/dL (ref 8.9–10.3)
CO2: 26 mmol/L (ref 22–32)
CREATININE: 0.91 mg/dL (ref 0.44–1.00)
Chloride: 95 mmol/L — ABNORMAL LOW (ref 101–111)
Glucose, Bld: 594 mg/dL (ref 65–99)
Potassium: 4.1 mmol/L (ref 3.5–5.1)
SODIUM: 134 mmol/L — AB (ref 135–145)

## 2015-04-15 LAB — I-STAT VENOUS BLOOD GAS, ED
Acid-Base Excess: 8 mmol/L — ABNORMAL HIGH (ref 0.0–2.0)
BICARBONATE: 35.1 meq/L — AB (ref 20.0–24.0)
O2 Saturation: 18 %
PH VEN: 7.414 — AB (ref 7.250–7.300)
PO2 VEN: 15 mmHg — AB (ref 30.0–45.0)
TCO2: 37 mmol/L (ref 0–100)
pCO2, Ven: 54.8 mmHg — ABNORMAL HIGH (ref 45.0–50.0)

## 2015-04-15 LAB — URINALYSIS, ROUTINE W REFLEX MICROSCOPIC
Bilirubin Urine: NEGATIVE
Glucose, UA: >1000 mg/dL — AB
Hgb urine dipstick: NEGATIVE
Ketones, ur: NEGATIVE mg/dL
LEUKOCYTES UA: NEGATIVE
Nitrite: NEGATIVE
PROTEIN: NEGATIVE mg/dL
SPECIFIC GRAVITY, URINE: 1.035 — AB (ref 1.005–1.030)
pH: 6 (ref 5.0–8.0)

## 2015-04-15 LAB — URINE MICROSCOPIC-ADD ON: RBC / HPF: NONE SEEN RBC/hpf (ref 0–5)

## 2015-04-15 LAB — GLUCOSE, CAPILLARY
GLUCOSE-CAPILLARY: 214 mg/dL — AB (ref 65–99)
Glucose-Capillary: 314 mg/dL — ABNORMAL HIGH (ref 65–99)

## 2015-04-15 LAB — CREATININE, SERUM: Creatinine, Ser: 0.71 mg/dL (ref 0.44–1.00)

## 2015-04-15 LAB — LACTIC ACID, PLASMA: Lactic Acid, Venous: 1.9 mmol/L (ref 0.5–2.0)

## 2015-04-15 MED ORDER — KETOROLAC TROMETHAMINE 30 MG/ML IJ SOLN: 30.0000 mg | Freq: Once | INTRAMUSCULAR | Status: DC

## 2015-04-15 MED ORDER — ONDANSETRON HCL 4 MG/2ML IJ SOLN: 4.0000 mg | Freq: Once | INTRAMUSCULAR | Status: DC

## 2015-04-15 MED ORDER — TIOTROPIUM BROMIDE MONOHYDRATE 18 MCG IN CAPS
18.0000 ug | ORAL_CAPSULE | Freq: Every day | RESPIRATORY_TRACT | Status: DC
Start: 2015-04-15 — End: 2015-04-17
  Administered 2015-04-16 – 2015-04-17 (×2): 18 ug via RESPIRATORY_TRACT
  Filled 2015-04-15: qty 5

## 2015-04-15 MED ORDER — HYDROMORPHONE HCL 1 MG/ML IJ SOLN
1.0000 mg | Freq: Once | INTRAMUSCULAR | Status: AC
  Administered 2015-04-15: 1 mg via INTRAVENOUS
  Filled 2015-04-15: qty 1

## 2015-04-15 MED ORDER — CLINDAMYCIN PHOSPHATE 600 MG/50ML IV SOLN
600.0000 mg | Freq: Once | INTRAVENOUS | Status: AC
  Administered 2015-04-15: 600 mg via INTRAVENOUS
  Filled 2015-04-15: qty 50

## 2015-04-15 MED ORDER — INSULIN ASPART 100 UNIT/ML ~~LOC~~ SOLN
6.0000 [IU] | Freq: Once | SUBCUTANEOUS | Status: AC
  Administered 2015-04-15: 6 [IU] via SUBCUTANEOUS
  Filled 2015-04-15: qty 1

## 2015-04-15 MED ORDER — GABAPENTIN 400 MG PO CAPS
800.0000 mg | ORAL_CAPSULE | Freq: Three times a day (TID) | ORAL | Status: DC
  Administered 2015-04-15 – 2015-04-17 (×5): 800 mg via ORAL
  Filled 2015-04-15 (×6): qty 2

## 2015-04-15 MED ORDER — INSULIN ASPART 100 UNIT/ML ~~LOC~~ SOLN
0.0000 [IU] | Freq: Every day | SUBCUTANEOUS | Status: DC
  Administered 2015-04-15: 2 [IU] via SUBCUTANEOUS

## 2015-04-15 MED ORDER — ENOXAPARIN SODIUM 40 MG/0.4ML ~~LOC~~ SOLN
40.0000 mg | SUBCUTANEOUS | Status: DC
  Administered 2015-04-15 – 2015-04-16 (×2): 40 mg via SUBCUTANEOUS
  Filled 2015-04-15 (×2): qty 0.4

## 2015-04-15 MED ORDER — SODIUM CHLORIDE 0.9 % IV SOLN: INTRAVENOUS | Status: AC

## 2015-04-15 MED ORDER — INSULIN ASPART 100 UNIT/ML ~~LOC~~ SOLN
0.0000 [IU] | Freq: Three times a day (TID) | SUBCUTANEOUS | Status: DC
  Administered 2015-04-15: 11 [IU] via SUBCUTANEOUS
  Administered 2015-04-16: 8 [IU] via SUBCUTANEOUS
  Filled 2015-04-15: qty 1

## 2015-04-15 MED ORDER — ACETAMINOPHEN 325 MG PO TABS
650.0000 mg | ORAL_TABLET | ORAL | Status: DC | PRN
  Administered 2015-04-15 – 2015-04-17 (×4): 650 mg via ORAL
  Filled 2015-04-15 (×4): qty 2

## 2015-04-15 MED ORDER — ALBUTEROL SULFATE (2.5 MG/3ML) 0.083% IN NEBU
2.5000 mg | INHALATION_SOLUTION | RESPIRATORY_TRACT | Status: DC | PRN
  Administered 2015-04-16 (×2): 2.5 mg via RESPIRATORY_TRACT
  Filled 2015-04-15 (×2): qty 3

## 2015-04-15 MED ORDER — CLINDAMYCIN PHOSPHATE 600 MG/50ML IV SOLN
600.0000 mg | Freq: Three times a day (TID) | INTRAVENOUS | Status: DC
  Administered 2015-04-15 – 2015-04-17 (×5): 600 mg via INTRAVENOUS
  Filled 2015-04-15 (×8): qty 50

## 2015-04-15 MED ORDER — SODIUM CHLORIDE 0.9 % IV BOLUS (SEPSIS)
1000.0000 mL | Freq: Once | INTRAVENOUS | Status: AC
  Administered 2015-04-15: 1000 mL via INTRAVENOUS

## 2015-04-15 MED ORDER — OXYCODONE-ACETAMINOPHEN 5-325 MG PO TABS
1.0000 | ORAL_TABLET | Freq: Four times a day (QID) | ORAL | Status: DC | PRN
  Administered 2015-04-16: 1 via ORAL
  Filled 2015-04-15 (×2): qty 1

## 2015-04-15 MED ORDER — SODIUM CHLORIDE 0.9 % IV BOLUS (SEPSIS)
500.0000 mL | Freq: Once | INTRAVENOUS | Status: AC
  Administered 2015-04-15: 500 mL via INTRAVENOUS

## 2015-04-15 NOTE — ED Provider Notes (Signed)
CSN: [id number]     Arrival date & time 04/15/15  0857 History   First MD Initiated Contact with Patient 04/15/15 [phone]     Chief Complaint  Patient presents with  . Hyperglycemia  . Skin Ulcer  . Foot Pain     (Consider location/radiation/quality/duration/timing/severity/associated sxs/prior Treatment) Patient is a 45 y.o. female presenting with hyperglycemia and lower extremity pain.  Hyperglycemia Blood sugar level PTA:  >500 Severity:  Mild Onset quality:  Gradual Duration:  7 days Timing:  Constant Progression:  Worsening Chronicity:  New Current diabetic treatments: supposed to be on insulin however not taking consistently  Context: noncompliance and recent illness   Relieved by:  None tried Ineffective treatments:  None tried Associated symptoms: fatigue, fever and nausea   Associated symptoms: no dysuria   Foot Pain    Past Medical History  Diagnosis Date  . Diabetes mellitus   . COPD (chronic obstructive pulmonary disease) ([place])   . Emphysema   . Neuropathy ([place])   . Fibromyalgia    Past Surgical History  Procedure Laterality Date  . Back surgery    . Tubal ligation    . Cesarean section    . Tonsillectomy     Family History  Problem Relation Age of Onset  . Lung cancer Mother    Social History  Substance Use Topics  . Smoking status: Current Some Day Smoker -- 0.50 packs/day for 30 years    Types: Cigarettes    Last Attempt to Quit: 10/05/2014  . Smokeless tobacco: None  . Alcohol Use: No   OB History    No data available     Review of Systems  Constitutional: Positive for fever, chills and fatigue.  Eyes: Negative for pain and redness.  Gastrointestinal: Positive for nausea.  Genitourinary: Negative for dysuria.  Musculoskeletal: Negative for myalgias and back pain.  Skin: Positive for rash.  All other systems reviewed and are negative.     Allergies  Darvocet; Fish-derived products; Flexeril; Toradol; and Vicodin  Home Medications    Prior to Admission medications   Medication Sig Start Date End Date Taking? Authorizing Provider  albuterol (PROVENTIL HFA;VENTOLIN HFA) 108 (90 BASE) MCG/ACT inhaler Inhale 2 puffs into the lungs every 4 (four) hours as needed for wheezing or shortness of breath. For wheeze or shortness of breath 10/23/14  Yes Nicholes Mango, MD  fluticasone-salmeterol (ADVAIR HFA) 115-21 MCG/ACT inhaler Inhale 2 puffs into the lungs 2 (two) times daily. 10/23/14  Yes Nicholes Mango, MD  gabapentin (NEURONTIN) 400 MG capsule Take 2 capsules (800 mg total) by mouth 3 (three) times daily. 10/23/14  Yes Nicholes Mango, MD  insulin aspart (NOVOLOG) 100 UNIT/ML injection Inject 10 Units into the skin 3 (three) times daily with meals. 10/23/14  Yes Nicholes Mango, MD  insulin glargine (LANTUS) 100 UNIT/ML injection Inject 0.25 mLs (25 Units total) into the skin 2 (two) times daily. 10/23/14  Yes Nicholes Mango, MD  metFORMIN (GLUCOPHAGE) 500 MG tablet Take 1 tablet (500 mg total) by mouth 2 (two) times daily with a meal. 10/23/14  Yes Nicholes Mango, MD  acetaminophen (TYLENOL) 325 MG tablet Take 650 mg by mouth every 6 (six) hours as needed for mild pain.    Historical Provider, MD  albuterol (PROVENTIL) (2.5 MG/3ML) 0.083% nebulizer solution Take 2.5 mg by nebulization every 4 (four) hours as needed for wheezing or shortness of breath.     Historical Provider, MD  guaiFENesin-dextromethorphan (ROBITUSSIN DM) 100-10 MG/5ML syrup Take 10 mLs by  mouth every 6 (six) hours as needed for cough. 10/23/14   Nicholes Mango, MD  ibuprofen (ADVIL,MOTRIN) 600 MG tablet Take 600 mg by mouth every 6 (six) hours as needed for fever or mild pain.    Historical Provider, MD  Influenza vac split quadrivalent PF (FLUARIX) 0.5 ML injection Inject 0.5 mLs into the muscle tomorrow at 10 am. Patient not taking: Reported on 04/15/2015 10/23/14   Nicholes Mango, MD  lidocaine (LIDODERM) 5 % Place 1 patch onto the skin daily as needed. For pain. Remove & Discard patch within  12 hours or as directed by MD    Historical Provider, MD  nicotine (NICODERM CQ - DOSED IN MG/24 HOURS) 21 mg/24hr patch Place 1 patch (21 mg total) onto the skin daily. 10/23/14   Nicholes Mango, MD  ondansetron (ZOFRAN) 4 MG tablet Take 1 tablet (4 mg total) by mouth every 8 (eight) hours as needed for nausea or vomiting. Patient not taking: Reported on 10/19/2014 08/03/14   Elmyra Ricks Pisciotta, PA-C  oxyCODONE-acetaminophen (ROXICET) 5-325 MG tablet Take 1 tablet by mouth every 6 (six) hours as needed. Patient taking differently: Take 1 tablet by mouth every 6 (six) hours as needed. For pain. 11/18/14   Harvest Dark, MD  pneumococcal 23 valent vaccine (PNU-IMMUNE) 25 MCG/0.5ML injection Inject 0.5 mLs into the muscle tomorrow at 10 am. Patient not taking: Reported on 04/15/2015 10/23/14   Nicholes Mango, MD  predniSONE (STERAPRED UNI-PAK 21 TAB) 10 MG (21) TBPK tablet Take 1 tablet (10 mg total) by mouth daily. Take 6 tablets by mouth for 1 day followed by  5 tablets by mouth for 1 day followed by  4 tablets by mouth for 1 day followed by  3 tablets by mouth for 1 day followed by  2 tablets by mouth for 1 day followed by  1 tablet by mouth for a day and stop Patient not taking: Reported on 04/15/2015 10/23/14   Nicholes Mango, MD  tiotropium (SPIRIVA) 18 MCG inhalation capsule Place 1 capsule (18 mcg total) into inhaler and inhale daily. 10/23/14   Nicholes Mango, MD   BP 143/91 mmHg  Pulse 106  Temp(Src) 99.1 F (37.3 C) (Oral)  Resp 30  SpO2 98%  LMP 03/18/2015 Physical Exam  Constitutional: She is oriented to person, place, and time. She appears well-developed and well-nourished.  HENT:  Head: Normocephalic and atraumatic.  Neck: Normal range of motion.  Cardiovascular: Normal rate and regular rhythm.   Pulmonary/Chest: Effort normal and breath sounds normal. No stridor. No respiratory distress.  Abdominal: Soft. She exhibits no distension.  Musculoskeletal: Normal range of motion. She exhibits no  edema or tenderness.  Neurological: She is alert and oriented to person, place, and time.  Skin: Skin is warm and dry. No rash noted. No erythema.  Dark hard lesion on right MTP joint with some swelling, erythema, streaking up the leg, ttp, mild induration around the area but no fluctuance.  Psychiatric: She has a normal mood and affect. Her behavior is normal.  Nursing note and vitals reviewed.   ED Course  Procedures (including critical care time) Labs Review Labs Reviewed  BASIC METABOLIC PANEL - Abnormal; Notable for the following:    Sodium 134 (*)    Chloride 95 (*)    Glucose, Bld 594 (*)    All other components within normal limits  CBC - Abnormal; Notable for the following:    WBC 25.7 (*)    All other components within normal limits  URINALYSIS,  ROUTINE W REFLEX MICROSCOPIC (NOT AT [facility]) - Abnormal; Notable for the following:    Specific Gravity, Urine 1.035 (*)    Glucose, UA >1000 (*)    All other components within normal limits  URINE MICROSCOPIC-ADD ON - Abnormal; Notable for the following:    Squamous Epithelial / LPF 0-5 (*)    Bacteria, UA RARE (*)    All other components within normal limits  HEMOGLOBIN A1C - Abnormal; Notable for the following:    Hgb A1c MFr Bld 11.2 (*)    All other components within normal limits  CBC - Abnormal; Notable for the following:    WBC 25.3 (*)    All other components within normal limits  BASIC METABOLIC PANEL - Abnormal; Notable for the following:    Potassium 3.3 (*)    Glucose, Bld 319 (*)    BUN 5 (*)    Calcium 8.5 (*)    All other components within normal limits  CBC - Abnormal; Notable for the following:    WBC 19.8 (*)    All other components within normal limits  GLUCOSE, CAPILLARY - Abnormal; Notable for the following:    Glucose-Capillary 314 (*)    All other components within normal limits  GLUCOSE, CAPILLARY - Abnormal; Notable for the following:    Glucose-Capillary 214 (*)    All other components within  normal limits  GLUCOSE, CAPILLARY - Abnormal; Notable for the following:    Glucose-Capillary 272 (*)    All other components within normal limits  GLUCOSE, CAPILLARY - Abnormal; Notable for the following:    Glucose-Capillary 254 (*)    All other components within normal limits  GLUCOSE, CAPILLARY - Abnormal; Notable for the following:    Glucose-Capillary 268 (*)    All other components within normal limits  CBG MONITORING, ED - Abnormal; Notable for the following:    Glucose-Capillary 583 (*)    All other components within normal limits  CBG MONITORING, ED - Abnormal; Notable for the following:    Glucose-Capillary 287 (*)    All other components within normal limits  CBG MONITORING, ED - Abnormal; Notable for the following:    Glucose-Capillary 332 (*)    All other components within normal limits  I-STAT VENOUS BLOOD GAS, ED - Abnormal; Notable for the following:    pH, Ven 7.414 (*)    pCO2, Ven 54.8 (*)    pO2, Ven 15.0 (*)    Bicarbonate 35.1 (*)    Acid-Base Excess 8.0 (*)    All other components within normal limits  CULTURE, BLOOD (ROUTINE X 2)  CULTURE, BLOOD (ROUTINE X 2)  URINE CULTURE  CREATININE, SERUM  LACTIC ACID, PLASMA    Imaging Review Mr Foot Left Wo Contrast  04/16/2015  CLINICAL DATA:  Left foot cellulitis and possible abscess. Erythema and induration especially around the great toe. EXAM: MRI OF THE LEFT FOREFOOT WITHOUT CONTRAST TECHNIQUE: Multiplanar, multisequence MR imaging was performed. No intravenous contrast was administered. COMPARISON:  04/15/2015 FINDINGS: Abnormal subcutaneous edema tracking along the dorsum of the foot, and to a lesser extent in the subcutaneous tissues the plantar foot. The medial foot is also particularly affected. The subcutaneous edema extends into the great toe and there some cutaneous irregularity medially in the great toe at about the level of the interphalangeal joint. There is no current evidence of osteomyelitis. Trace  effusion first MTP joint, especially along the metatarsal sesamoid articulation. Trace tenosynovitis of the flexor hallucis longus distally. There is  some confluent phlegmon dorsal to the first metatarsal head and superficial to the extensor hallucis longus tendon, image 20 series 6 and image 4 series 5, but this is not felt to represent an overt abscess. No drainable abscess identified. IMPRESSION: 1. Edema likely reflecting cellulitis of the dorsum of the foot and tracking especially into the great toe. There is some confluent phlegmon dorsal to the first MTP joint but without an overt drainable abscess. Trace effusion of the first MTP joint especially along the metatarsal sesamoid articulation but without the degree of synovitis that would typically be present in the setting of a septic joint. No findings of osteomyelitis. 2. Mild flexor hallucis longus tenosynovitis proximal to the sesamoids. Electronically Signed   By: Van Clines M.D.   On: 04/16/2015 06:42   Dg Foot Complete Left  04/15/2015  CLINICAL DATA:  reports increased pain in Lt foot up the whole LT leg. Pt has a LG black ulcer to Lt great toe for 2 weeks,hx DM EXAM: LEFT FOOT - COMPLETE 3+ VIEW COMPARISON:  08/17/2011 FINDINGS: There is soft tissue swelling ulceration along the medial aspect of the great toes. No radiodense foreign body. No osseous erosion of first ray. The midfoot is normal. IMPRESSION: 1. Soft tissue Ulceration along the medial aspect of the first digit. 2.  No acute osseous abnormality. Electronically Signed   By: Suzy Bouchard M.D.   On: 04/15/2015 11:15   I have personally reviewed and evaluated these images and lab results as part of my medical decision-making.   EKG Interpretation None      MDM   Final diagnoses:  Cellulitis   Likely developing cellulitis on right foot. Hyperglycemia as well. Flu like illness a few days ago. Will tx with abx, pain meds, fluids, insulin and reassess.   Hyperglycemic  withotu DKA. Wbc quite elevated, will admit for iv abx, hyperglycemia and continued treatment/care.    Merrily Pew, MD 04/16/15 [phone]

## 2015-04-15 NOTE — ED Notes (Signed)
Patient transported to X-ray 

## 2015-04-15 NOTE — ED Notes (Signed)
Pt became very hostile about her bed upstairs not being ready, st "this hospital is so big and my bed still ain't ready?". I explained to her to her that we have very sick pt's during the flu season, but that the pt in her room was just d/c, and that they are cleaning it. As soon as bed was available, I went and wrote room number down on board. Made her aware.

## 2015-04-15 NOTE — Care Management Note (Signed)
Case Management Note  Patient Details  Name: Katie Guzman MRN: [id number] Date of Birth: [birth year]  Subjective/Objective:                  Likely developing cellulitis on right foot. Hyperglycemia as well. Flu like illness a few days ago.// Home alone.  Action/Plan: Likely developing cellulitis on right foot. Hyperglycemia as well. Flu like illness a few days ago. Will tx with abx, pain meds, fluids, insulin and reassess. Follow for disposition needs.   Expected Discharge Date:     04/16/14             Expected Discharge Plan:  Home/Self Care  In-House Referral:  NA  Discharge planning Services  CM Consult, Follow-up appt scheduled  Post Acute Care Choice:  NA Choice offered to:  Patient  DME Arranged:  N/A DME Agency:  NA  HH Arranged:  NA HH Agency:  NA  Status of Service:  Completed, signed off  Medicare Important Message Given:    Date Medicare IM Given:    Medicare IM give by:    Date Additional Medicare IM Given:    Additional Medicare Important Message give by:     If discussed at [place] of Stay Meetings, dates discussed:    Additional Comments: Katie Guzman J. Clydene Laming, RN, BSN, [place] [phone] ER CM consulted regarding PCP establishment and insurance enrollment. Pt presented to [facility] ER today with cellulitis, hyperglycemic and flu like symtoms. NCM met with pt at bedside; pt confirms not having access to f/u care with PCP or insurance coverage. Discussed with patient importance and benefits of establishing PCP, and not utilizing the ER for primary care needs. Pt verbalized understanding and is in agreement.  NCM advised that Internal Medicine providers at [facility] are accepting new pts that are uninsured. Pt verbalized understanding and asked to have appointment arranged. NCM set up appointment at [facility] with Cammie Sickle, [place] on 3/30 at 0900.  NCM will inform pt they may visit [place] and [place] for Rx needs upon  discharge.    Katie Mandril, RN 04/15/2015, 2:28 PM

## 2015-04-15 NOTE — ED Notes (Signed)
Pt reports increased pain in Lt foot up the whole LT leg. Pt has a LG black ulcer to Lt great toe. CBG in [employer] in 500's . Pt reports the insuline has been out of date for 2 years.

## 2015-04-15 NOTE — H&P (Signed)
[facility] History and Physical  Katie Guzman [id number] DOB: [birth year] DOA: 04/15/2015  Referring physician:  PCP: No PCP Per Patient   Chief Complaint: Left foot cellulitis  HPI: Katie Guzman is a 45 y.o. female  with a history of diabetes mellitus type 2, poorly controlled, COPD, fibromyalgia, peripheral neuropathy, presenting today with increased left foot pain radiating to the left leg, accompanied by new ulceration on the left toe area. Patient is unable to answer questions at this time, due to pain control and medications with which caused her some lethargy. Per EDP report, about 2 days ago, the patient noted a blister on her left foot at the bunion, which begun to drain. She shaved the area with unclean instrument, with pain  becoming more severe, 10/10.   At the ED, foot x ray Showed soft tissue ulceration along the medial aspect of the first digit without acute osseous abnormality. She had low-grade fever to 99, leukocytosis with a white count of 25.7With the rest of the CBC and CMET unremarkable. Glucose was 583, after receiving insulin dropped to 297. She received IV fluids, pain medication and one dose of clindamycin per cellulitis order set without significant relief.   Review of Systems: Unable to obtain, as the patient is lethargic, falling asleep easily, unable to answer questions at this time   Past Medical History  Diagnosis Date  . Diabetes mellitus   . COPD (chronic obstructive pulmonary disease) ([place])   . Emphysema   . Neuropathy ([place])   . Fibromyalgia    Past Surgical History  Procedure Laterality Date  . Back surgery    . Tubal ligation    . Cesarean section    . Tonsillectomy     Social History:  reports that she has been smoking Cigarettes.  She has a 15 pack-year smoking history. She does not have any smokeless tobacco history on file. She reports that she does not drink alcohol or use illicit drugs.  Allergies  Allergen  Reactions  . Darvocet [Propoxyphene N-Acetaminophen] Hives and Nausea And Vomiting  . Fish-Derived Products Other (See Comments)    Patient does not like to eat fish. Not allergic but prefers not to.  Marland Kitchen Flexeril [Cyclobenzaprine] Hives and Nausea And Vomiting  . Toradol [Ketorolac Tromethamine] Nausea And Vomiting  . Vicodin [Hydrocodone-Acetaminophen] Hives and Nausea And Vomiting    Family History  Problem Relation Age of Onset  . Lung cancer Mother      Prior to Admission medications   Medication Sig Start Date End Date Taking? Authorizing Provider  albuterol (PROVENTIL HFA;VENTOLIN HFA) 108 (90 BASE) MCG/ACT inhaler Inhale 2 puffs into the lungs every 4 (four) hours as needed for wheezing or shortness of breath. For wheeze or shortness of breath 10/23/14  Yes Nicholes Mango, MD  fluticasone-salmeterol (ADVAIR HFA) 115-21 MCG/ACT inhaler Inhale 2 puffs into the lungs 2 (two) times daily. 10/23/14  Yes Nicholes Mango, MD  gabapentin (NEURONTIN) 400 MG capsule Take 2 capsules (800 mg total) by mouth 3 (three) times daily. 10/23/14  Yes Nicholes Mango, MD  insulin aspart (NOVOLOG) 100 UNIT/ML injection Inject 10 Units into the skin 3 (three) times daily with meals. 10/23/14  Yes Nicholes Mango, MD  insulin glargine (LANTUS) 100 UNIT/ML injection Inject 0.25 mLs (25 Units total) into the skin 2 (two) times daily. 10/23/14  Yes Nicholes Mango, MD  metFORMIN (GLUCOPHAGE) 500 MG tablet Take 1 tablet (500 mg total) by mouth 2 (two) times daily with a meal. 10/23/14  Yes Nicholes Mango, MD  acetaminophen (TYLENOL) 325 MG tablet Take 650 mg by mouth every 6 (six) hours as needed for mild pain.    Historical Provider, MD  albuterol (PROVENTIL) (2.5 MG/3ML) 0.083% nebulizer solution Take 2.5 mg by nebulization every 4 (four) hours as needed for wheezing or shortness of breath.     Historical Provider, MD  guaiFENesin-dextromethorphan (ROBITUSSIN DM) 100-10 MG/5ML syrup Take 10 mLs by mouth every 6 (six) hours as needed for  cough. 10/23/14   Nicholes Mango, MD  ibuprofen (ADVIL,MOTRIN) 600 MG tablet Take 600 mg by mouth every 6 (six) hours as needed for fever or mild pain.    Historical Provider, MD  Influenza vac split quadrivalent PF (FLUARIX) 0.5 ML injection Inject 0.5 mLs into the muscle tomorrow at 10 am. Patient not taking: Reported on 04/15/2015 10/23/14   Nicholes Mango, MD  lidocaine (LIDODERM) 5 % Place 1 patch onto the skin daily as needed. For pain. Remove & Discard patch within 12 hours or as directed by MD    Historical Provider, MD  nicotine (NICODERM CQ - DOSED IN MG/24 HOURS) 21 mg/24hr patch Place 1 patch (21 mg total) onto the skin daily. 10/23/14   Nicholes Mango, MD  ondansetron (ZOFRAN) 4 MG tablet Take 1 tablet (4 mg total) by mouth every 8 (eight) hours as needed for nausea or vomiting. Patient not taking: Reported on 10/19/2014 08/03/14   Elmyra Ricks Pisciotta, PA-C  oxyCODONE-acetaminophen (ROXICET) 5-325 MG tablet Take 1 tablet by mouth every 6 (six) hours as needed. Patient taking differently: Take 1 tablet by mouth every 6 (six) hours as needed. For pain. 11/18/14   Harvest Dark, MD  pneumococcal 23 valent vaccine (PNU-IMMUNE) 25 MCG/0.5ML injection Inject 0.5 mLs into the muscle tomorrow at 10 am. Patient not taking: Reported on 04/15/2015 10/23/14   Nicholes Mango, MD  predniSONE (STERAPRED UNI-PAK 21 TAB) 10 MG (21) TBPK tablet Take 1 tablet (10 mg total) by mouth daily. Take 6 tablets by mouth for 1 day followed by  5 tablets by mouth for 1 day followed by  4 tablets by mouth for 1 day followed by  3 tablets by mouth for 1 day followed by  2 tablets by mouth for 1 day followed by  1 tablet by mouth for a day and stop Patient not taking: Reported on 04/15/2015 10/23/14   Nicholes Mango, MD  tiotropium (SPIRIVA) 18 MCG inhalation capsule Place 1 capsule (18 mcg total) into inhaler and inhale daily. 10/23/14   Nicholes Mango, MD   Physical Exam: Filed Vitals:   04/15/15 0920 04/15/15 0940 04/15/15 0946  BP: 121/79  147/66 143/91  Pulse: 100 100 106  Temp: 99.1 F (37.3 C)    TempSrc: Oral    Resp: 30    SpO2: 100% 97% 98%    Wt Readings from Last 3 Encounters:  11/18/14 72.576 kg (160 lb)  10/19/14 77.202 kg (170 lb 3.2 oz)  08/03/14 72.576 kg (160 lb)    General: Appears ill appearing due to pain, uncomfortable due to pain, falls back asleep frequently during exam Eyes:  PERRL, EOMI, normal lids, iris ENT: grossly normal hearing, lips & tongue Neck: no lymphadenopathy, masses or thyromegaly Cardiovascular: regular rate and rythm, no murmurs, rubs or gallops. No lower extremity edema   Respiratory: bilateral expiratory wheezing, no rhonchi or rales. Normal respiratory effort. Abdomen: soft,non-tender, normal bowel sounds Skin: no rash. Left foot ulceration with erythema, especially between the left great toe and left second toe,  and a visible eschar on left bunion area Musculoskeletal:  grossly normal tone in both upper and lower extremities Psychiatric: grossly normal mood and affect, speech fluent and appropriate Neurologic: CN 2-12 grossly intact, moves all extremities in coordinated fashion.          Labs on Admission:  Basic Metabolic Panel:  Recent Labs Lab 04/15/15 1145  NA 134*  K 4.1  CL 95*  CO2 26  GLUCOSE 594*  BUN 9  CREATININE 0.91  CALCIUM 9.6     CBC:  Recent Labs Lab 04/15/15 1145  WBC 25.7*  HGB 14.8  HCT 45.1  MCV 90.7  PLT 328    CBG:  Recent Labs Lab 04/15/15 0925 04/15/15 1256  GLUCAP 583* 287*    Radiological Exams on Admission: Dg Foot Complete Left  04/15/2015  CLINICAL DATA:  reports increased pain in Lt foot up the whole LT leg. Pt has a LG black ulcer to Lt great toe for 2 weeks,hx DM EXAM: LEFT FOOT - COMPLETE 3+ VIEW COMPARISON:  08/17/2011 FINDINGS: There is soft tissue swelling ulceration along the medial aspect of the great toes. No radiodense foreign body. No osseous erosion of first ray. The midfoot is normal. IMPRESSION: 1.  Soft tissue Ulceration along the medial aspect of the first digit. 2.  No acute osseous abnormality. Electronically Signed   By: Suzy Bouchard M.D.   On: 04/15/2015 11:15    EKG: Independently reviewed.    Assessment/Plan Principal Problem:   Cellulitis Active Problems:   Diabetes mellitus (HCC)   Pain syndrome, chronic   COPD (chronic obstructive pulmonary disease) (HCC)   Diabetic neuropathy (HCC)    Left foot cellulitis, in the setting of trauma to the foot by shaving the area with unclean instrument and poorly controlled DM. X-ray  -Admit for obs to med surg IV clindamycin as per Cellulitis protocol orders Blood cultures; check urine culture MRI foot to rule out bone involvement IVF   Leukocytosis, in the setting of cellulitis, COPD inflammation, infection and pain. Rest of CBC unremarkable. She has low grade fever at 40F, likely reactive IV clindamycin per Cellulitis order set Repeat CBC in am  Type II DiabetesPoorly controlled.  Patient has not been on insulin for greater than 3 years, and has been borrowing other diabetic neighbors meds.Last A1C in 2014 was 12 . Initial Glucose level was 583,  But after receiving insulin dropped to 297  Check  A1C  Hold home oral diabetic medications.  Start SSI  Diabetes educator Carb modified diet. Patient has no PCP. Will request Case manager to search community resources to establish a primary care physician prior to discharge.   Left foot pain due to cellulitis in the setting of chronic pain syndrome and peripheral neuropathy Toradol IV for now, continue Neurontin  COPD Nebulizers prn - O2 as needed - CBC in am Incentive spirometry   Code Status: Full Code DVT Prophylaxis: Lovenox Family Communication: at bedside Disposition Plan: Pending Improvement. Admitted for observation in tele bed. Expected LOS 24-48 hrs. As mentioned priorly, Will request Case manager to search community resources to establish a primary care  physician prior to discharge to care for her medical needs including DM2 and COPD, as patient has no PCP    [facility] E,PA-C [facility] [email or web address] Password [email or web address]

## 2015-04-15 NOTE — ED Notes (Signed)
Attempted report 

## 2015-04-16 ENCOUNTER — Encounter (HOSPITAL_COMMUNITY): Payer: Self-pay | Admitting: Internal Medicine

## 2015-04-16 DIAGNOSIS — E876 Hypokalemia: Secondary | ICD-10-CM | POA: Diagnosis present

## 2015-04-16 DIAGNOSIS — M65979 Unspecified synovitis and tenosynovitis, unspecified ankle and foot: Secondary | ICD-10-CM

## 2015-04-16 DIAGNOSIS — E11628 Type 2 diabetes mellitus with other skin complications: Secondary | ICD-10-CM

## 2015-04-16 DIAGNOSIS — M659 Synovitis and tenosynovitis, unspecified: Secondary | ICD-10-CM

## 2015-04-16 DIAGNOSIS — E1142 Type 2 diabetes mellitus with diabetic polyneuropathy: Secondary | ICD-10-CM

## 2015-04-16 DIAGNOSIS — F17210 Nicotine dependence, cigarettes, uncomplicated: Secondary | ICD-10-CM | POA: Diagnosis present

## 2015-04-16 DIAGNOSIS — Z794 Long term (current) use of insulin: Secondary | ICD-10-CM | POA: Diagnosis not present

## 2015-04-16 DIAGNOSIS — J449 Chronic obstructive pulmonary disease, unspecified: Secondary | ICD-10-CM | POA: Diagnosis present

## 2015-04-16 DIAGNOSIS — E11621 Type 2 diabetes mellitus with foot ulcer: Secondary | ICD-10-CM | POA: Diagnosis present

## 2015-04-16 DIAGNOSIS — E1140 Type 2 diabetes mellitus with diabetic neuropathy, unspecified: Secondary | ICD-10-CM | POA: Diagnosis present

## 2015-04-16 DIAGNOSIS — Z7984 Long term (current) use of oral hypoglycemic drugs: Secondary | ICD-10-CM | POA: Diagnosis not present

## 2015-04-16 DIAGNOSIS — Z7951 Long term (current) use of inhaled steroids: Secondary | ICD-10-CM | POA: Diagnosis not present

## 2015-04-16 DIAGNOSIS — L0390 Cellulitis, unspecified: Secondary | ICD-10-CM | POA: Diagnosis present

## 2015-04-16 DIAGNOSIS — L03116 Cellulitis of left lower limb: Secondary | ICD-10-CM | POA: Diagnosis present

## 2015-04-16 DIAGNOSIS — L97529 Non-pressure chronic ulcer of other part of left foot with unspecified severity: Secondary | ICD-10-CM | POA: Diagnosis present

## 2015-04-16 DIAGNOSIS — M797 Fibromyalgia: Secondary | ICD-10-CM | POA: Diagnosis present

## 2015-04-16 DIAGNOSIS — G894 Chronic pain syndrome: Secondary | ICD-10-CM | POA: Diagnosis present

## 2015-04-16 DIAGNOSIS — E1165 Type 2 diabetes mellitus with hyperglycemia: Secondary | ICD-10-CM | POA: Diagnosis present

## 2015-04-16 HISTORY — DX: Synovitis and tenosynovitis, unspecified: M65.9

## 2015-04-16 HISTORY — DX: Unspecified synovitis and tenosynovitis, unspecified ankle and foot: M65.979

## 2015-04-16 LAB — URINE CULTURE

## 2015-04-16 LAB — CBC
HCT: 40.5 % (ref 36.0–46.0)
HEMOGLOBIN: 13.5 g/dL (ref 12.0–15.0)
MCH: 29.9 pg (ref 26.0–34.0)
MCHC: 33.3 g/dL (ref 30.0–36.0)
MCV: 89.6 fL (ref 78.0–100.0)
Platelets: 247 10*3/uL (ref 150–400)
RBC: 4.52 MIL/uL (ref 3.87–5.11)
RDW: 13 % (ref 11.5–15.5)
WBC: 19.8 10*3/uL — AB (ref 4.0–10.5)

## 2015-04-16 LAB — BASIC METABOLIC PANEL
ANION GAP: 11 (ref 5–15)
BUN: 5 mg/dL — ABNORMAL LOW (ref 6–20)
CALCIUM: 8.5 mg/dL — AB (ref 8.9–10.3)
CO2: 24 mmol/L (ref 22–32)
Chloride: 103 mmol/L (ref 101–111)
Creatinine, Ser: 0.7 mg/dL (ref 0.44–1.00)
GLUCOSE: 319 mg/dL — AB (ref 65–99)
Potassium: 3.3 mmol/L — ABNORMAL LOW (ref 3.5–5.1)
SODIUM: 138 mmol/L (ref 135–145)

## 2015-04-16 LAB — GLUCOSE, CAPILLARY
GLUCOSE-CAPILLARY: 272 mg/dL — AB (ref 65–99)
GLUCOSE-CAPILLARY: 254 mg/dL — AB (ref 65–99)
GLUCOSE-CAPILLARY: 268 mg/dL — AB (ref 65–99)
Glucose-Capillary: 301 mg/dL — ABNORMAL HIGH (ref 65–99)

## 2015-04-16 LAB — HEMOGLOBIN A1C
HEMOGLOBIN A1C: 11.2 % — AB (ref 4.8–5.6)
Mean Plasma Glucose: 275 mg/dL

## 2015-04-16 MED ORDER — OXYCODONE HCL 5 MG PO TABS
10.0000 mg | ORAL_TABLET | ORAL | Status: AC
  Administered 2015-04-16: 10 mg via ORAL
  Filled 2015-04-16: qty 2

## 2015-04-16 MED ORDER — POLYETHYLENE GLYCOL 3350 17 G PO PACK
17.0000 g | PACK | Freq: Every day | ORAL | Status: DC
  Administered 2015-04-16 – 2015-04-17 (×2): 17 g via ORAL
  Filled 2015-04-16 (×2): qty 1

## 2015-04-16 MED ORDER — OXYCODONE HCL 5 MG PO TABS
20.0000 mg | ORAL_TABLET | ORAL | Status: DC | PRN
  Administered 2015-04-16 – 2015-04-17 (×6): 20 mg via ORAL
  Filled 2015-04-16 (×6): qty 4

## 2015-04-16 MED ORDER — HYDROXYZINE HCL 25 MG PO TABS
50.0000 mg | ORAL_TABLET | Freq: Four times a day (QID) | ORAL | Status: DC | PRN
  Administered 2015-04-16 – 2015-04-17 (×2): 50 mg via ORAL
  Filled 2015-04-16 (×2): qty 2

## 2015-04-16 MED ORDER — OXYCODONE HCL 5 MG PO TABS
5.0000 mg | ORAL_TABLET | Freq: Once | ORAL | Status: AC
  Administered 2015-04-16: 5 mg via ORAL
  Filled 2015-04-16: qty 1

## 2015-04-16 MED ORDER — MAGNESIUM CITRATE PO SOLN
1.0000 | Freq: Once | ORAL | Status: AC
  Administered 2015-04-16: 1 via ORAL
  Filled 2015-04-16: qty 296

## 2015-04-16 MED ORDER — INSULIN GLARGINE 100 UNIT/ML ~~LOC~~ SOLN
10.0000 [IU] | Freq: Every day | SUBCUTANEOUS | Status: DC
  Administered 2015-04-16: 10 [IU] via SUBCUTANEOUS
  Filled 2015-04-16 (×2): qty 0.1

## 2015-04-16 MED ORDER — MORPHINE SULFATE (PF) 2 MG/ML IV SOLN
2.0000 mg | Freq: Once | INTRAVENOUS | Status: AC
  Administered 2015-04-16: 2 mg via INTRAVENOUS
  Filled 2015-04-16: qty 1

## 2015-04-16 MED ORDER — INSULIN ASPART 100 UNIT/ML ~~LOC~~ SOLN
0.0000 [IU] | Freq: Three times a day (TID) | SUBCUTANEOUS | Status: DC
  Administered 2015-04-16 – 2015-04-17 (×4): 8 [IU] via SUBCUTANEOUS

## 2015-04-16 MED ORDER — SODIUM CHLORIDE 0.9 % IV SOLN: INTRAVENOUS | Status: DC

## 2015-04-16 MED ORDER — INSULIN ASPART 100 UNIT/ML ~~LOC~~ SOLN
0.0000 [IU] | Freq: Every day | SUBCUTANEOUS | Status: DC
  Administered 2015-04-16: 4 [IU] via SUBCUTANEOUS

## 2015-04-16 MED ORDER — INSULIN ASPART 100 UNIT/ML ~~LOC~~ SOLN
4.0000 [IU] | Freq: Three times a day (TID) | SUBCUTANEOUS | Status: DC
  Administered 2015-04-16 – 2015-04-17 (×4): 4 [IU] via SUBCUTANEOUS

## 2015-04-16 MED ORDER — POTASSIUM CHLORIDE CRYS ER 20 MEQ PO TBCR
40.0000 meq | EXTENDED_RELEASE_TABLET | Freq: Once | ORAL | Status: AC
  Administered 2015-04-16: 40 meq via ORAL
  Filled 2015-04-16: qty 2

## 2015-04-16 MED ORDER — OXYCODONE-ACETAMINOPHEN 5-325 MG PO TABS
1.0000 | ORAL_TABLET | ORAL | Status: DC | PRN
  Administered 2015-04-16: 2 via ORAL
  Administered 2015-04-16: 1 via ORAL
  Administered 2015-04-16: 2 via ORAL
  Filled 2015-04-16: qty 1
  Filled 2015-04-16 (×2): qty 2

## 2015-04-16 NOTE — Care Management Obs Status (Signed)
[facility] NOTIFICATION   Patient Details  Name: Katie Guzman MRN: [id number] Date of Birth: 04-09-1970   Medicare Observation Status Notification Given:  Yes    Ninfa Meeker, RN 04/16/2015, 11:00 AM

## 2015-04-16 NOTE — Progress Notes (Signed)
Progress Note   Katie Guzman [id number] DOB: [birth year] DOA: 04/15/2015 PCP: No PCP Per Patient   Brief Narrative:   Katie Guzman is an 45 y.o. female with a PMH of diabetes, COPD, diabetic neuropathy and fibromyalgia who was admitted 04/15/15 for treatment of left foot cellulitis and possible abscess. MRI of the left foot was negative for drainable abscess and findings consistent with osteomyelitis.  Assessment/Plan:   Principal Problem:   Cellulitis with flexor hallucis longus tenosynovitis - Continue IV clindamycin. - Follow-up blood cultures. - MRI negative for osteomyelitis/drainable abscess but did show tenosynovitis.  - Discussed with orthopedic surgeon on call who recommends a course of antibiotics and possible steroids. - Continue pain control efforts. Oxycodone increased to 20 mg by mouth every 4 hours as needed.  Active Problems:   Diabetes mellitus ([place]), poorly controlled, with complications - Hemoglobin A1c is 11.2 corresponding to a mean plasma glucose of 275. - Currently being managed with moderate scale SSI. CBGs 214-332. Add 10 units of Lantus and meal coverage. - DM coordinator consultation pending. Counseled about the importance of good diabetes control to prevent complications.    Pain syndrome, chronic - Try to avoid IV narcotics. Patient reports chronic lower back pain for which she uses 10 mg of oxycodone every 4 hours. - Check HIV antibody.    COPD (chronic obstructive pulmonary disease) (HCC) - Continue Spiriva.    Diabetic neuropathy (HCC) - Continue Neurontin.    Hypokalemia - Replete.    DVT Prophylaxis - Lovenox ordered.   Family Communication/Anticipated D/C date and plan/Code Status   Family Communication: No family currently at the bedside. Disposition Plan/date: Home when afebrile 24 hours, pain improved, and can be safely transitioned to oral antibiotics. Code Status: Full code.   IV Access:     Peripheral IV   Procedures and diagnostic studies:   Mr Foot Left Wo Contrast  04/16/2015  CLINICAL DATA:  Left foot cellulitis and possible abscess. Erythema and induration especially around the great toe. EXAM: MRI OF THE LEFT FOREFOOT WITHOUT CONTRAST TECHNIQUE: Multiplanar, multisequence MR imaging was performed. No intravenous contrast was administered. COMPARISON:  04/15/2015 FINDINGS: Abnormal subcutaneous edema tracking along the dorsum of the foot, and to a lesser extent in the subcutaneous tissues the plantar foot. The medial foot is also particularly affected. The subcutaneous edema extends into the great toe and there some cutaneous irregularity medially in the great toe at about the level of the interphalangeal joint. There is no current evidence of osteomyelitis. Trace effusion first MTP joint, especially along the metatarsal sesamoid articulation. Trace tenosynovitis of the flexor hallucis longus distally. There is some confluent phlegmon dorsal to the first metatarsal head and superficial to the extensor hallucis longus tendon, image 20 series 6 and image 4 series 5, but this is not felt to represent an overt abscess. No drainable abscess identified. IMPRESSION: 1. Edema likely reflecting cellulitis of the dorsum of the foot and tracking especially into the great toe. There is some confluent phlegmon dorsal to the first MTP joint but without an overt drainable abscess. Trace effusion of the first MTP joint especially along the metatarsal sesamoid articulation but without the degree of synovitis that would typically be present in the setting of a septic joint. No findings of osteomyelitis. 2. Mild flexor hallucis longus tenosynovitis proximal to the sesamoids. Electronically Signed   By: Van Clines M.D.   On: 04/16/2015 06:42   Dg Foot Complete Left  04/15/2015  CLINICAL  DATA:  reports increased pain in Lt foot up the whole LT leg. Pt has a LG black ulcer to Lt great toe for 2  weeks,hx DM EXAM: LEFT FOOT - COMPLETE 3+ VIEW COMPARISON:  08/17/2011 FINDINGS: There is soft tissue swelling ulceration along the medial aspect of the great toes. No radiodense foreign body. No osseous erosion of first ray. The midfoot is normal. IMPRESSION: 1. Soft tissue Ulceration along the medial aspect of the first digit. 2.  No acute osseous abnormality. Electronically Signed   By: Suzy Bouchard M.D.   On: 04/15/2015 11:15     Medical Consultants:    None.  Anti-Infectives:   Clindamycin 04/15/15--->  Subjective:    Katie Guzman is crying out in pain. Has left foot swelling radiating up to groin.  No nausea or vomiting.  Reports constipation.   Objective:    Filed Vitals:   04/16/15 0019 04/16/15 0055 04/16/15 0242 04/16/15 0411  BP: 130/63     Pulse: 96     Temp: 102.7 F (39.3 C) 102.5 F (39.2 C) 101.3 F (38.5 C) 100.1 F (37.8 C)  TempSrc: Axillary Rectal Rectal Rectal  Resp: 20     SpO2: 93%      No intake or output data in the 24 hours ending 04/16/15 0752 There were no vitals filed for this visit.  Exam: Gen:  Tearful, restless, Appears older than stated age Cardiovascular:  RRR, No M/R/G Respiratory:  Lungs CTAB Gastrointestinal:  Abdomen soft, NT/ND, + BS Extremities:  Left foot erythematous and swollen, as pictured below.       Data Reviewed:    Labs: Basic Metabolic Panel:  Recent Labs Lab 04/15/15 1145 04/15/15 1648 04/16/15 0432  NA 134*  --  138  K 4.1  --  3.3*  CL 95*  --  103  CO2 26  --  24  GLUCOSE 594*  --  319*  BUN 9  --  5*  CREATININE 0.91 0.71 0.70  CALCIUM 9.6  --  8.5*   GFR CrCl cannot be calculated (Unknown ideal weight.). Liver Function Tests: No results for input(s): AST, ALT, ALKPHOS, BILITOT, PROT, ALBUMIN in the last 168 hours. No results for input(s): LIPASE, AMYLASE in the last 168 hours. No results for input(s): AMMONIA in the last 168 hours. Coagulation profile No results for  input(s): INR, PROTIME in the last 168 hours.  CBC:  Recent Labs Lab 04/15/15 1145 04/15/15 1648 04/16/15 0432  WBC 25.7* 25.3* 19.8*  HGB 14.8 13.7 13.5  HCT 45.1 41.6 40.5  MCV 90.7 89.5 89.6  PLT 328 270 247   CBG:  Recent Labs Lab 04/15/15 1256 04/15/15 1841 04/15/15 1937 04/15/15 2220 04/16/15 0646  GLUCAP 287* 332* 314* 214* 272*   Hgb A1c:  Recent Labs  04/15/15 1648  HGBA1C 11.2*   Sepsis Labs:  Recent Labs Lab 04/15/15 1145 04/15/15 1648 04/15/15 1953 04/16/15 0432  WBC 25.7* 25.3*  --  19.8*  LATICACIDVEN  --   --  1.9  --    Microbiology No results found for this or any previous visit (from the past 240 hour(s)).   Medications:   . clindamycin (CLEOCIN) IV  600 mg Intravenous 3 times per day  . enoxaparin (LOVENOX) injection  40 mg Subcutaneous Q24H  . gabapentin  800 mg Oral TID  . insulin aspart  0-15 Units Subcutaneous TID WC  . insulin aspart  0-5 Units Subcutaneous QHS  . ketorolac  30 mg Intravenous  Once  . ondansetron (ZOFRAN) IV  4 mg Intravenous Once  . tiotropium  18 mcg Inhalation Daily   Continuous Infusions:   Time spent: 35 minutes with > 50% of time discussing current diagnostic Guzman results, clinical impression and plan of care.     [facility] Pager [phone]. If unable to reach me by pager, please call my cell phone at [phone].  *Please refer to [email or web address], password [email or web address] to get updated schedule on who will round on this patient, as hospitalists switch teams weekly. If 7PM-7AM, please contact night-coverage at [email or web address], password [email or web address] for any overnight needs.  04/16/2015, 7:52 AM

## 2015-04-16 NOTE — Progress Notes (Signed)
Pt arrived to unit w/ a temp of 103.3 oral. Rectal temp 103.8. No tylenol orders at this time. Rapid response notified at this time. [facility] on call notified. Received orders for 500 cc bolus, 650 mg tylenol q4h, and lab orders for a lactic acid to be drawn STAT. Orders carried out. Pts rectal temp came down to 100.6. Lactic acid came back as 1.9. This morning pts rectal temp back up to 102.5, after another dose of tylenol administered via percocet, pts temp is now 100.1 rectally. Report given to on coming nurse. Nursing will continue to monitor.

## 2015-04-16 NOTE — Progress Notes (Signed)
Inpatient Diabetes Program Recommendations  AACE/ADA: New Consensus Statement on Inpatient Glycemic Control (2015)  Target Ranges:  Prepandial:   less than 140 mg/dL      Peak postprandial:   less than 180 mg/dL (1-2 hours)      Critically ill patients:  140 - 180 mg/dL   Results for Katie Guzman, Katie Guzman (MRN [id number]) as of 04/16/2015 09:03  Ref. Range 04/15/2015 12:56 04/15/2015 18:41 04/15/2015 19:37 04/15/2015 22:20 04/16/2015 06:46  Glucose-Capillary Latest Ref Range: 65-99 mg/dL 287 (H) 332 (H) 314 (H) 214 (H) 272 (H)   Review of Glycemic Control  Diabetes history: DM 2 Outpatient Diabetes medications: Lantus 25 units BID, Metformin 500 mg BID Current orders for Inpatient glycemic control: Lantus 10 units QHS, Novolog Moderate + HS + Novolog 4 meal coverage  Inpatient Diabetes Program Recommendations: Insulin - Basal: Patient receives Lantus 25 units BID at home. May need to increase basal insulin dose to 15 units Lantus BID. Please adjust basal insulin time to receive Daily so patient can get a dose this am.  Thanks,  Tama Headings RN, MSN, [facility] Inpatient Diabetes Coordinator Team Pager [phone] (8a-5p)

## 2015-04-16 NOTE — Progress Notes (Signed)
Pt anxious crying wanting more pain med Dr Rockne Menghini called ordered Vistaril 50 every 6 hrs for anxiety

## 2015-04-16 NOTE — Progress Notes (Signed)
Family pushing pt around in wc  Informed not to go off of unit

## 2015-04-17 DIAGNOSIS — F0631 Mood disorder due to known physiological condition with depressive features: Secondary | ICD-10-CM | POA: Diagnosis present

## 2015-04-17 DIAGNOSIS — F329 Major depressive disorder, single episode, unspecified: Secondary | ICD-10-CM

## 2015-04-17 LAB — CBC
HEMATOCRIT: 36.8 % (ref 36.0–46.0)
HEMOGLOBIN: 11.8 g/dL — AB (ref 12.0–15.0)
MCH: 28.9 pg (ref 26.0–34.0)
MCHC: 32.1 g/dL (ref 30.0–36.0)
MCV: 90.2 fL (ref 78.0–100.0)
Platelets: 242 10*3/uL (ref 150–400)
RBC: 4.08 MIL/uL (ref 3.87–5.11)
RDW: 13 % (ref 11.5–15.5)
WBC: 11.7 10*3/uL — AB (ref 4.0–10.5)

## 2015-04-17 LAB — COMPREHENSIVE METABOLIC PANEL
ALT: 13 U/L — AB (ref 14–54)
AST: 27 U/L (ref 15–41)
Albumin: 2.8 g/dL — ABNORMAL LOW (ref 3.5–5.0)
Alkaline Phosphatase: 83 U/L (ref 38–126)
Anion gap: 9 (ref 5–15)
BUN: 8 mg/dL (ref 6–20)
CHLORIDE: 104 mmol/L (ref 101–111)
CO2: 21 mmol/L — AB (ref 22–32)
Calcium: 8.3 mg/dL — ABNORMAL LOW (ref 8.9–10.3)
Creatinine, Ser: 0.64 mg/dL (ref 0.44–1.00)
GFR calc Af Amer: >60 mL/min (ref >60)
GFR calc non Af Amer: >60 mL/min (ref >60)
Glucose, Bld: 294 mg/dL — ABNORMAL HIGH (ref 65–99)
POTASSIUM: 4.7 mmol/L (ref 3.5–5.1)
SODIUM: 134 mmol/L — AB (ref 135–145)
Total Bilirubin: 0.8 mg/dL (ref 0.3–1.2)
Total Protein: 5.9 g/dL — ABNORMAL LOW (ref 6.5–8.1)

## 2015-04-17 LAB — GLUCOSE, CAPILLARY
Glucose-Capillary: 267 mg/dL — ABNORMAL HIGH (ref 65–99)
Glucose-Capillary: 285 mg/dL — ABNORMAL HIGH (ref 65–99)

## 2015-04-17 LAB — HIV ANTIBODY (ROUTINE TESTING W REFLEX): HIV SCREEN 4TH GENERATION: NONREACTIVE

## 2015-04-17 MED ORDER — HALOPERIDOL LACTATE 5 MG/ML IJ SOLN: 2.0000 mg | Freq: Once | INTRAMUSCULAR | Status: DC

## 2015-04-17 MED ORDER — SODIUM CHLORIDE 0.9 % IV SOLN
INTRAVENOUS | Status: DC
  Administered 2015-04-17: 08:00:00 via INTRAVENOUS

## 2015-04-17 MED ORDER — LORAZEPAM 2 MG/ML IJ SOLN
1.0000 mg | Freq: Once | INTRAMUSCULAR | Status: DC
  Filled 2015-04-17: qty 1

## 2015-04-17 MED ORDER — INSULIN GLARGINE 100 UNIT/ML ~~LOC~~ SOLN
12.0000 [IU] | Freq: Two times a day (BID) | SUBCUTANEOUS | Status: DC
  Administered 2015-04-17: 12 [IU] via SUBCUTANEOUS
  Filled 2015-04-17 (×2): qty 0.12

## 2015-04-17 MED ORDER — NICOTINE 21 MG/24HR TD PT24: 21.0000 mg | MEDICATED_PATCH | Freq: Every day | TRANSDERMAL | Status: DC

## 2015-04-17 NOTE — Progress Notes (Signed)
MD ordered, ativan and haldol. Pt refused both medications.

## 2015-04-17 NOTE — Progress Notes (Signed)
Patient screaming, cursing to the staff and very irate due to patient's RN smelled smoke in the room as patient had went to the bathroom to smoke cigarettes. When I entered the room (Department Director), patient continued to curse and I politely asked patient to not use profanity as I was there to help her and figure out what her concerns were and how I could help her. The patient began to curse at me and stated that she did not like being accused of smoking and stated she did not want the camera on in the camera room. Patient continued to be agitated and security and [facility] police was also present on the unit. Spoke with patient's daughter Vevelyn Royals to make her aware of what was going on with her mother and reiterated to the mother that she could not video and record me. The daughter became very loud on the phone and stated she was in Nursing school and we had neglected her mother as her mother had stooled on herself. Made daughter aware that we had not neglected her mom and her mother was going to the bathroom on her own without calling us and that is why we did not know mom had gotten stool on her clothing.  Made daughter aware that staff were in and out of the mother's room frequently today and yesterday and during the night as mom would sometimes leave the unit during the night and re-assured her staff were taking great care of her mom. Daughter continued to fuss and negate everything I was trying to inform her of and I informed her i could no longer go back and forth with her on the phone and I would let her speak with the nurse Meredeth Ide to explain her mom's surgical goals.Spoke with Dr. Candiss Norse to see if we could give the patient something to calm her down and he ordered Ativan 1mg  and Haldol 2mg  IV once. Patient stated she was not taking Haldol even when trying to explain to her what it was for as she indicated it is for old people.

## 2015-04-17 NOTE — Progress Notes (Signed)
Around 4am this am, pt awakened for blood work. Pt co 10/10 pain to left great toe. Pt raised voice and used profanity at lab personnel. Pt stated "i'm not gonna do this again". When RN asked "do what?" pt replied "be in pain like this". Pt again demanded RN to call doctor for pain medicine. RN paged night coverage. Schorr, NP advised RN to give PRN dose of Oxycodone STAT, provide emotional support and discuss with attending in the AM. No new orders provided. RN followed advice. Pt resting at this time. Nursing will continue to monitor.

## 2015-04-17 NOTE — Progress Notes (Signed)
During rounds, RN smelled cigarette smoke in patient's bathroom. Pt is screaming and crusing at staff. RN asked pt politely to behave. Pt stated '' I can say whatever I want ''. Charge nurse at bedside. Security, MD and department director paged.

## 2015-04-17 NOTE — Progress Notes (Signed)
Pt anxious, crying and asking for more pain meds after PRN oxy 20mg  given. RN provided emotional support. Will continue to monitor pt.

## 2015-04-17 NOTE — Progress Notes (Signed)
Inpatient Diabetes Program Recommendations  AACE/ADA: New Consensus Statement on Inpatient Glycemic Control (2015)  Target Ranges:  Prepandial:   less than 140 mg/dL      Peak postprandial:   less than 180 mg/dL (1-2 hours)      Critically ill patients:  140 - 180 mg/dL   Review of Glycemic Control   Inpatient Diabetes Program Recommendations:  Insulin - Basal: Increase Lantus to home dose Thank you  Raoul Pitch BSN, RN,CDE Inpatient Diabetes Coordinator [phone] (team pager)

## 2015-04-17 NOTE — Consult Note (Addendum)
[facility] Consult   Reason for Consult:  Depression and behavioral problems Referring Physician:  Dr. Candiss Norse Patient Identification: Katie Guzman MRN:  [id number] Principal Diagnosis: Other depression due to general medical condition Diagnosis:   Patient Active Problem List   Diagnosis Date Noted  . 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 Tenosynovitis of foot [M65.9] 04/16/2015  . Hypokalemia [E87.6] 04/16/2015  . Cellulitis [L03.90] 04/15/2015  . Diabetes mellitus ([place]) [E11.9] 04/15/2015  . Pain syndrome, chronic [G89.4] 04/15/2015  . COPD (chronic obstructive pulmonary disease) ([place]) [J44.9] 04/15/2015  . Diabetic neuropathy ([place]) [E11.40] 04/15/2015  . Diabetes mellitus with complication ([place]) [J82.5]   . COPD exacerbation ([place]) [J44.1] 10/19/2014    Total Time spent with patient: 1 hour  Subjective:   Katie Guzman is a 45 y.o. female patient admitted with behavioral problems and depression.  HPI:  Katie Guzman is a 45 y.o. femaleseen, chart reviewed and case discussed with Dr. Candiss Norse and patient staff RN. Patient is admitted to [facility] for increased left foot pain radiating to the left leg. Patient is also suffering with multiple medical problems including diabetes mellitus type 2, poorly controlled, COPD, fibromyalgia, peripheral neuropathy. Patient stated that she has been irated because staff is complaining about lying and searching her as she is criminal when she walked out of the hospital to smoke 3 times since yesterday. Patient also reported she is under significant stress because of not having enough resources to care take care of her health needs. Reportedly she has been trying to reach disability benefits and lost her Medicaid didn't have months ago. Patient was told she may lose her foot or 2 because of diabetic ulcer which made her upset. Patient stated she did apologize herself for her  behavior and attitude. Patient appreciated when offered nicotine patch and also increased to stay in hospital to take care of her foot. Patient seems like made up her mind and leave to another hospital. Staff RN reported she requested Paper work for the [facility]. Patient has no history of of depression, anxiety, psychosis and denies current suicidal/homicidal ideation, intention or plans. Patient has no evidence of psychosis. Patient has a friend in her room who supportive to her. Patient case with her daughter and 2 grandchildren. Patient stated she is not willing to give up her tobacco which she smokes one pack a day since he was 45 years old. Patient stated she lost everything except her daughter and does not want to give up smoking tobacco.  Past Psychiatric History: None never been hospitalized psychiatric reasons.  Risk to Self: Is patient at risk for suicide?: No Risk to Others:   Prior Inpatient Therapy:   Prior Outpatient Therapy:    Past Medical History:  Past Medical History  Diagnosis Date  . Diabetes mellitus     Poorly controlled with complications  . COPD (chronic obstructive pulmonary disease) ([place])   . Emphysema   . Neuropathy ([place])   . Fibromyalgia   . Tenosynovitis of foot 04/16/2015  . Cellulitis 04/15/2015    Past Surgical History  Procedure Laterality Date  . Back surgery    . Tubal ligation    . Cesarean section    . Tonsillectomy     Family History:  Family History  Problem Relation Age of Onset  . Lung cancer Mother    Family Psychiatric  History: Unknown. Stated patient daughter has been trying to become and in nursing education and she  is able to care for her grandchildren at home.  Social History:  History  Alcohol Use No     History  Drug Use No    Social History   Social History  . Marital Status: Widowed    Spouse Name: N/A  . Number of Children: N/A  . Years of Education: N/A   Social History Main Topics  . Smoking status: Current Some Day  Smoker -- 0.50 packs/day for 30 years    Types: Cigarettes    Last Attempt to Quit: 10/05/2014  . Smokeless tobacco: None  . Alcohol Use: No  . Drug Use: No  . Sexual Activity: Not Asked   Other Topics Concern  . None   Social History Narrative   Additional Social History:    Allergies:   Allergies  Allergen Reactions  . Hydrocodone Hives  . Darvocet [Propoxyphene N-Acetaminophen] Hives and Nausea And Vomiting  . Fish-Derived Products Other (See Comments)    Patient does not like to eat fish. Not allergic but prefers not to.  Marland Kitchen Flexeril [Cyclobenzaprine] Hives and Nausea And Vomiting  . Propofol   . Toradol [Ketorolac Tromethamine] Nausea And Vomiting  . Vicodin [Hydrocodone-Acetaminophen] Hives and Nausea And Vomiting    Labs:  Results for orders placed or performed during the hospital encounter of 04/15/15 (from the past 48 hour(s))  Culture, blood (routine x 2)     Status: None (Preliminary result)   Collection Time: 04/15/15  4:30 PM  Result Value Ref Range   Specimen Description BLOOD LEFT ANTECUBITAL    Special Requests      BOTTLES DRAWN AEROBIC AND ANAEROBIC BLUE 10CC RED 5CC PATIENT ON FOLLOWING CLINDAMYCIN   Culture NO GROWTH < 24 HOURS    Report Status PENDING   Culture, blood (routine x 2)     Status: None (Preliminary result)   Collection Time: 04/15/15  4:32 PM  Result Value Ref Range   Specimen Description BLOOD LEFT HAND    Special Requests      BOTTLES DRAWN AEROBIC ONLY 10CC PATIENT ON FOLLOWING CLINDAMYCIN   Culture NO GROWTH < 24 HOURS    Report Status PENDING   Hemoglobin A1c     Status: Abnormal   Collection Time: 04/15/15  4:48 PM  Result Value Ref Range   Hgb A1c MFr Bld 11.2 (H) 4.8 - 5.6 %    Comment: (NOTE)         Pre-diabetes: 5.7 - 6.4         Diabetes: >6.4         Glycemic control for adults with diabetes: <7.0    Mean Plasma Glucose 275 mg/dL    Comment: (NOTE) Performed At: [facility] [place],  [place] [id number] Lindon Romp MD [id number]   CBC     Status: Abnormal   Collection Time: 04/15/15  4:48 PM  Result Value Ref Range   WBC 25.3 (H) 4.0 - 10.5 K/uL   RBC 4.65 3.87 - 5.11 MIL/uL   Hemoglobin 13.7 12.0 - 15.0 g/dL   HCT 41.6 36.0 - 46.0 %   MCV 89.5 78.0 - 100.0 fL   MCH 29.5 26.0 - 34.0 pg   MCHC 32.9 30.0 - 36.0 g/dL   RDW 12.9 11.5 - 15.5 %   Platelets 270 150 - 400 K/uL  Creatinine, serum     Status: None   Collection Time: 04/15/15  4:48 PM  Result Value Ref Range   Creatinine, Ser 0.71 0.44 -  1.00 mg/dL   GFR calc non Af Amer >60 >60 mL/min   GFR calc Af Amer >60 >60 mL/min    Comment: (NOTE) The eGFR has been calculated using the CKD EPI equation. This calculation has not been validated in all clinical situations. eGFR's persistently <60 mL/min signify possible Chronic Kidney Disease.   POC CBG, ED     Status: Abnormal   Collection Time: 04/15/15  6:41 PM  Result Value Ref Range   Glucose-Capillary 332 (H) 65 - 99 mg/dL  Glucose, capillary     Status: Abnormal   Collection Time: 04/15/15  7:37 PM  Result Value Ref Range   Glucose-Capillary 314 (H) 65 - 99 mg/dL  Lactic acid, plasma     Status: None   Collection Time: 04/15/15  7:53 PM  Result Value Ref Range   Lactic Acid, Venous 1.9 0.5 - 2.0 mmol/L  Glucose, capillary     Status: Abnormal   Collection Time: 04/15/15 10:20 PM  Result Value Ref Range   Glucose-Capillary 214 (H) 65 - 99 mg/dL  Basic metabolic panel     Status: Abnormal   Collection Time: 04/16/15  4:32 AM  Result Value Ref Range   Sodium 138 135 - 145 mmol/L   Potassium 3.3 (L) 3.5 - 5.1 mmol/L    Comment: DELTA CHECK NOTED   Chloride 103 101 - 111 mmol/L   CO2 24 22 - 32 mmol/L   Glucose, Bld 319 (H) 65 - 99 mg/dL   BUN 5 (L) 6 - 20 mg/dL   Creatinine, Ser 0.70 0.44 - 1.00 mg/dL   Calcium 8.5 (L) 8.9 - 10.3 mg/dL   GFR calc non Af Amer >60 >60 mL/min   GFR calc Af Amer >60 >60 mL/min    Comment: (NOTE) The eGFR has  been calculated using the CKD EPI equation. This calculation has not been validated in all clinical situations. eGFR's persistently <60 mL/min signify possible Chronic Kidney Disease.    Anion gap 11 5 - 15  CBC     Status: Abnormal   Collection Time: 04/16/15  4:32 AM  Result Value Ref Range   WBC 19.8 (H) 4.0 - 10.5 K/uL   RBC 4.52 3.87 - 5.11 MIL/uL   Hemoglobin 13.5 12.0 - 15.0 g/dL   HCT 40.5 36.0 - 46.0 %   MCV 89.6 78.0 - 100.0 fL   MCH 29.9 26.0 - 34.0 pg   MCHC 33.3 30.0 - 36.0 g/dL   RDW 13.0 11.5 - 15.5 %   Platelets 247 150 - 400 K/uL  Glucose, capillary     Status: Abnormal   Collection Time: 04/16/15  6:46 AM  Result Value Ref Range   Glucose-Capillary 272 (H) 65 - 99 mg/dL  Glucose, capillary     Status: Abnormal   Collection Time: 04/16/15 11:52 AM  Result Value Ref Range   Glucose-Capillary 254 (H) 65 - 99 mg/dL  Glucose, capillary     Status: Abnormal   Collection Time: 04/16/15  4:13 PM  Result Value Ref Range   Glucose-Capillary 268 (H) 65 - 99 mg/dL  Glucose, capillary     Status: Abnormal   Collection Time: 04/16/15  9:38 PM  Result Value Ref Range   Glucose-Capillary 301 (H) 65 - 99 mg/dL  Glucose, capillary     Status: Abnormal   Collection Time: 04/17/15  6:39 AM  Result Value Ref Range   Glucose-Capillary 267 (H) 65 - 99 mg/dL  HIV antibody  Status: None   Collection Time: 04/17/15  7:06 AM  Result Value Ref Range   HIV Screen 4th Generation wRfx Non Reactive Non Reactive    Comment: (NOTE) Performed At: [facility] [place], [place] [id number] Lindon Romp MD [id number]   Comprehensive metabolic panel     Status: Abnormal   Collection Time: 04/17/15  7:06 AM  Result Value Ref Range   Sodium 134 (L) 135 - 145 mmol/L   Potassium 4.7 3.5 - 5.1 mmol/L    Comment: HEMOLYSIS AT THIS LEVEL MAY AFFECT RESULT   Chloride 104 101 - 111 mmol/L   CO2 21 (L) 22 - 32 mmol/L   Glucose, Bld 294 (H) 65 - 99 mg/dL   BUN  8 6 - 20 mg/dL   Creatinine, Ser 0.64 0.44 - 1.00 mg/dL   Calcium 8.3 (L) 8.9 - 10.3 mg/dL   Total Protein 5.9 (L) 6.5 - 8.1 g/dL   Albumin 2.8 (L) 3.5 - 5.0 g/dL   AST 27 15 - 41 U/L   ALT 13 (L) 14 - 54 U/L   Alkaline Phosphatase 83 38 - 126 U/L   Total Bilirubin 0.8 0.3 - 1.2 mg/dL   GFR calc non Af Amer >60 >60 mL/min   GFR calc Af Amer >60 >60 mL/min    Comment: (NOTE) The eGFR has been calculated using the CKD EPI equation. This calculation has not been validated in all clinical situations. eGFR's persistently <60 mL/min signify possible Chronic Kidney Disease.    Anion gap 9 5 - 15  CBC     Status: Abnormal   Collection Time: 04/17/15  9:44 AM  Result Value Ref Range   WBC 11.7 (H) 4.0 - 10.5 K/uL   RBC 4.08 3.87 - 5.11 MIL/uL   Hemoglobin 11.8 (L) 12.0 - 15.0 g/dL   HCT 36.8 36.0 - 46.0 %   MCV 90.2 78.0 - 100.0 fL   MCH 28.9 26.0 - 34.0 pg   MCHC 32.1 30.0 - 36.0 g/dL   RDW 13.0 11.5 - 15.5 %   Platelets 242 150 - 400 K/uL  Glucose, capillary     Status: Abnormal   Collection Time: 04/17/15 11:24 AM  Result Value Ref Range   Glucose-Capillary 285 (H) 65 - 99 mg/dL   Comment 1 Notify RN     Current Facility-Administered Medications  Medication Dose Route Frequency Provider Last Rate Last Dose  . 0.9 %  sodium chloride infusion   Intravenous Continuous [name], MD 50 mL/hr at 04/17/15 0908    . acetaminophen (TYLENOL) tablet 650 mg  650 mg Oral Q4H PRN Rhetta Mura Schorr, NP   650 mg at 04/17/15 0401  . albuterol (PROVENTIL) (2.5 MG/3ML) 0.083% nebulizer solution 2.5 mg  2.5 mg Nebulization Q4H PRN Rondel Jumbo, PA-C   2.5 mg at 04/16/15 2258  . clindamycin (CLEOCIN) IVPB 600 mg  600 mg Intravenous 3 times per day Rondel Jumbo, PA-C   600 mg at 04/17/15 2836  . enoxaparin (LOVENOX) injection 40 mg  40 mg Subcutaneous Q24H Rondel Jumbo, PA-C   40 mg at 04/16/15 2046  . gabapentin (NEURONTIN) capsule 800 mg  800 mg Oral TID Rondel Jumbo, PA-C   800 mg  at 04/17/15 0825  . haloperidol lactate (HALDOL) injection 2 mg  2 mg Intravenous Once [name], MD   2 mg at 04/17/15 1230  . hydrOXYzine (ATARAX/VISTARIL) tablet 50 mg  50 mg Oral  Q6H PRN Venetia Maxon Rama, MD   50 mg at 04/17/15 0017  . insulin aspart (novoLOG) injection 0-15 Units  0-15 Units Subcutaneous TID WC Venetia Maxon Rama, MD   8 Units at 04/17/15 1243  . insulin aspart (novoLOG) injection 0-5 Units  0-5 Units Subcutaneous QHS Venetia Maxon Rama, MD   4 Units at 04/16/15 2257  . insulin aspart (novoLOG) injection 4 Units  4 Units Subcutaneous TID WC Venetia Maxon Rama, MD   4 Units at 04/17/15 1243  . insulin glargine (LANTUS) injection 12 Units  12 Units Subcutaneous BID [name], MD   12 Units at 04/17/15 1112  . LORazepam (ATIVAN) injection 1 mg  1 mg Intravenous Once [name], MD   1 mg at 04/17/15 1230  . nicotine (NICODERM CQ - dosed in mg/24 hours) patch 21 mg  21 mg Transdermal Daily Ambrose Finland, MD      . ondansetron (ZOFRAN) injection 4 mg  4 mg Intravenous Once Waldemar Dickens, MD      . oxyCODONE (Oxy IR/ROXICODONE) immediate release tablet 20 mg  20 mg Oral Q4H PRN Venetia Maxon Rama, MD   20 mg at 04/17/15 1242  . polyethylene glycol (MIRALAX / GLYCOLAX) packet 17 g  17 g Oral Daily Venetia Maxon Rama, MD   17 g at 04/17/15 0825  . tiotropium (SPIRIVA) inhalation capsule 18 mcg  18 mcg Inhalation Daily Rondel Jumbo, PA-C   18 mcg at 04/17/15 1000   Current Outpatient Prescriptions  Medication Sig Dispense Refill  . albuterol (PROVENTIL HFA;VENTOLIN HFA) 108 (90 BASE) MCG/ACT inhaler Inhale 2 puffs into the lungs every 4 (four) hours as needed for wheezing or shortness of breath. For wheeze or shortness of breath 1 Inhaler 0  . fluticasone-salmeterol (ADVAIR HFA) 115-21 MCG/ACT inhaler Inhale 2 puffs into the lungs 2 (two) times daily. 1 Inhaler 0  . gabapentin (NEURONTIN) 400 MG capsule Take 2 capsules (800 mg total) by mouth 3 (three) times  daily. 180 capsule 0  . insulin aspart (NOVOLOG) 100 UNIT/ML injection Inject 10 Units into the skin 3 (three) times daily with meals. 1 vial 0  . insulin glargine (LANTUS) 100 UNIT/ML injection Inject 0.25 mLs (25 Units total) into the skin 2 (two) times daily. 1 vial 0  . metFORMIN (GLUCOPHAGE) 500 MG tablet Take 1 tablet (500 mg total) by mouth 2 (two) times daily with a meal. 60 tablet 0  . acetaminophen (TYLENOL) 325 MG tablet Take 650 mg by mouth every 6 (six) hours as needed for mild pain.    Marland Kitchen albuterol (PROVENTIL) (2.5 MG/3ML) 0.083% nebulizer solution Take 2.5 mg by nebulization every 4 (four) hours as needed for wheezing or shortness of breath.     . guaiFENesin-dextromethorphan (ROBITUSSIN DM) 100-10 MG/5ML syrup Take 10 mLs by mouth every 6 (six) hours as needed for cough. 118 mL 0  . ibuprofen (ADVIL,MOTRIN) 600 MG tablet Take 600 mg by mouth every 6 (six) hours as needed for fever or mild pain.    . Influenza vac split quadrivalent PF (FLUARIX) 0.5 ML injection Inject 0.5 mLs into the muscle tomorrow at 10 am. (Patient not taking: Reported on 04/15/2015) 0.5 mL 0  . lidocaine (LIDODERM) 5 % Place 1 patch onto the skin daily as needed. For pain. Remove & Discard patch within 12 hours or as directed by MD    . nicotine (NICODERM CQ - DOSED IN MG/24 HOURS) 21 mg/24hr patch Place 1 patch (21 mg  total) onto the skin daily. 28 patch 0  . ondansetron (ZOFRAN) 4 MG tablet Take 1 tablet (4 mg total) by mouth every 8 (eight) hours as needed for nausea or vomiting. (Patient not taking: Reported on 10/19/2014) 10 tablet 0  . oxyCODONE-acetaminophen (ROXICET) 5-325 MG tablet Take 1 tablet by mouth every 6 (six) hours as needed. (Patient taking differently: Take 1 tablet by mouth every 6 (six) hours as needed. For pain.) 15 tablet 0  . pneumococcal 23 valent vaccine (PNU-IMMUNE) 25 MCG/0.5ML injection Inject 0.5 mLs into the muscle tomorrow at 10 am. (Patient not taking: Reported on 04/15/2015) 2.5 mL 0  .  predniSONE (STERAPRED UNI-PAK 21 TAB) 10 MG (21) TBPK tablet Take 1 tablet (10 mg total) by mouth daily. Take 6 tablets by mouth for 1 day followed by  5 tablets by mouth for 1 day followed by  4 tablets by mouth for 1 day followed by  3 tablets by mouth for 1 day followed by  2 tablets by mouth for 1 day followed by  1 tablet by mouth for a day and stop (Patient not taking: Reported on 04/15/2015) 21 tablet 0  . tiotropium (SPIRIVA) 18 MCG inhalation capsule Place 1 capsule (18 mcg total) into inhaler and inhale daily. 30 capsule 0    Musculoskeletal: Strength & Muscle Tone: within normal limits Gait & Station: walked on one leg when transferred from bed to couch.  Patient leans: N/A  Psychiatric Specialty Exam: ROS leg pain, back pains and has an attitude towards staff  No Fever-chills, No Headache, No changes with Vision or hearing, reports vertigo No problems swallowing food or Liquids, No Chest pain, Cough or Shortness of Breath, No Abdominal pain, No Nausea or Vommitting, Bowel movements are regular, No Blood in stool or Urine, No dysuria, No new skin rashes or bruises, No new joints pains-aches,  No new weakness, tingling, numbness in any extremity, No recent weight gain or loss, No polyuria, polydypsia or polyphagia,  A full 10 point Review of Systems was done, except as stated above, all other Review of Systems were negative.  Blood pressure 153/88, pulse 83, temperature 98.7 F (37.1 C), temperature source Oral, resp. rate 20, last menstrual period 03/18/2015, SpO2 96 %.There is no weight on file to calculate BMI.  General Appearance: Casual  Eye Contact::  Good  Speech:  Clear and Coherent  Volume:  Normal  Mood:  Anxious, Depressed and Irritable  Affect:  Appropriate and Congruent  Thought Process:  Coherent and Goal Directed  Orientation:  Full (Time, Place, and Person)  Thought Content:  WDL  Suicidal Thoughts:  No  Homicidal Thoughts:  No  Memory:  Immediate;    Good Recent;   Good  Judgement:  Fair  Insight:  Fair  Psychomotor Activity:  Decreased  Concentration:  Good  Recall:  Good  Fund of Knowledge:Good  Language: Good  Akathisia:  Negative  Handed:  Right  AIMS (if indicated):     Assets:  Communication Skills Desire for Improvement Housing Leisure Time Resilience Social Support Transportation  ADL's:  Intact  Cognition: WNL  Sleep:      Treatment Plan Summary: Patient meets criteria for capacity make her own medical decisions and living arrangements Patient has no safety concerns as she denies active suicidal or homicidal ideations Encourage to stay in hospital and care for her foot/toe and uncontrollable diabetes Start nicotine patch 21 mg daily for Nicotine cravings Patient does not required antidepressant medications during this visit  Disposition: Patient does not meet criteria for psychiatric inpatient admission. Supportive therapy provided about ongoing stressors.  Durward Parcel., MD 04/17/2015 2:55 PM

## 2015-04-17 NOTE — Progress Notes (Signed)
Pt went outside to smoke this morning, RN informed pt that she cannot go outside to smoke again. MD made aware.

## 2015-04-17 NOTE — Progress Notes (Signed)
Progress Note   Katie Guzman [id number] DOB: Oct 24, 1970 DOA: 04/15/2015 PCP: No PCP Per Patient   Brief Narrative:   Katie Guzman is an 45 y.o. female with a PMH of diabetes, COPD, diabetic neuropathy and fibromyalgia who was admitted 04/15/15 for treatment of left foot cellulitis and possible abscess. MRI of the left foot was negative for drainable abscess and findings consistent with ? Septic arthritis along with tenosynovitis.  Assessment/Plan:   Principal Problem:   Cellulitis with flexor hallucis longus tenosynovitis - Continue IV clindamycin. Monitor cultures, MRI noted, possible Tenosynovitis with ? Septic arthritis, on exam there is some fluctuance, have consulted Dr. Sharol Given for evaluation, patient may require I&D, stressed on Medication compliance and DM control. - Pending HIV antibody.   COPD (chronic obstructive pulmonary disease) (HCC) - Continue Spiriva. Ongoing smoking counseled to quit. Patient leaving the hospital premises to go out and smoke, found smoking in the bathroom.    Diabetic neuropathy (HCC) - Continue Neurontin.    Hypokalemia - Repleted.   Pain syndrome, chronic - consistently exhibiting narcotic seeking behavior, despite appearing absolutely comfortable insisting that she gets IV narcotics, found smoking in the parking lot and again in the room, patient tolerated, physically and verbally abusive to the staff see various nursing notes from 04/16/2015 to 04/17/2015. [place] police and security also involved. Psych has been called to address her behavior. For now continue present Narcotic regimen.  Trying to avoid IV narcotics as she is in no distress and was walking to the parking lot to smoke. Patient reports chronic lower back pain for which she uses 10 mg of oxycodone every 4 hours   Diabetes mellitus ([place]), poorly controlled, with complications - Hemoglobin A1c is 11.2 corresponding to a mean plasma glucose of 275. Lantus dose  adjusted, continue sliding scale and pre-meal NovoLog. Diabetic educator reconsulted. Patient counseled on compliance with medications. CBG (last 3)   Recent Labs  04/16/15 2138 04/17/15 0639 04/17/15 1124  GLUCAP 301* 267* 285*       DVT Prophylaxis - Lovenox ordered.   Family Communication/Anticipated D/C date and plan/Code Status   Family Communication: No family currently at the bedside. Disposition Plan/date: Home when afebrile 24 hours, pain improved, and can be safely transitioned to oral antibiotics. Code Status: Full code.   IV Access:    Peripheral IV   Procedures and diagnostic studies:   Mr Foot Left Wo Contrast  04/16/2015  CLINICAL DATA:  Left foot cellulitis and possible abscess. Erythema and induration especially around the great toe. EXAM: MRI OF THE LEFT FOREFOOT WITHOUT CONTRAST TECHNIQUE: Multiplanar, multisequence MR imaging was performed. No intravenous contrast was administered. COMPARISON:  04/15/2015 FINDINGS: Abnormal subcutaneous edema tracking along the dorsum of the foot, and to a lesser extent in the subcutaneous tissues the plantar foot. The medial foot is also particularly affected. The subcutaneous edema extends into the great toe and there some cutaneous irregularity medially in the great toe at about the level of the interphalangeal joint. There is no current evidence of osteomyelitis. Trace effusion first MTP joint, especially along the metatarsal sesamoid articulation. Trace tenosynovitis of the flexor hallucis longus distally. There is some confluent phlegmon dorsal to the first metatarsal head and superficial to the extensor hallucis longus tendon, image 20 series 6 and image 4 series 5, but this is not felt to represent an overt abscess. No drainable abscess identified. IMPRESSION: 1. Edema likely reflecting cellulitis of the dorsum of the foot and tracking especially  into the great toe. There is some confluent phlegmon dorsal to the first MTP  joint but without an overt drainable abscess. Trace effusion of the first MTP joint especially along the metatarsal sesamoid articulation but without the degree of synovitis that would typically be present in the setting of a septic joint. No findings of osteomyelitis. 2. Mild flexor hallucis longus tenosynovitis proximal to the sesamoids. Electronically Signed   By: Van Clines M.D.   On: 04/16/2015 06:42   Dg Foot Complete Left  04/15/2015  CLINICAL DATA:  reports increased pain in Lt foot up the whole LT leg. Pt has a LG black ulcer to Lt great toe for 2 weeks,hx DM EXAM: LEFT FOOT - COMPLETE 3+ VIEW COMPARISON:  08/17/2011 FINDINGS: There is soft tissue swelling ulceration along the medial aspect of the great toes. No radiodense foreign body. No osseous erosion of first ray. The midfoot is normal. IMPRESSION: 1. Soft tissue Ulceration along the medial aspect of the first digit. 2.  No acute osseous abnormality. Electronically Signed   By: Suzy Bouchard M.D.   On: 04/15/2015 11:15     Medical Consultants:    None.  Anti-Infectives:   Clindamycin 04/15/15--->  Subjective:    Katie Guzman is sitting comfortably in bed In, upon my arrival demanded that her pain medications be increased, no headache chest or abdominal pain, pains of left toe pain, also agrees that she has chronic pain and uses narcotics on a chronic basis.   Objective:    Filed Vitals:   04/16/15 2116 04/17/15 0100 04/17/15 0630 04/17/15 1241  BP: 141/83  116/68 153/88  Pulse: 98  88 83  Temp: 99.7 F (37.6 C) 97.2 F (36.2 C) 97.8 F (36.6 C) 98.7 F (37.1 C)  TempSrc:      Resp: 20  20 20   SpO2: 99%  98% 96%    Intake/Output Summary (Last 24 hours) at 04/17/15 1316 Last data filed at 04/17/15 0900  Gross per 24 hour  Intake    720 ml  Output      0 ml  Net    720 ml   There were no vitals filed for this visit.  Exam: Gen:  Tearful, restless, Appears older than stated  age Cardiovascular:  RRR, No M/R/G Respiratory:  Lungs CTAB Gastrointestinal:  Abdomen soft, NT/ND, + BS Extremities:  Left foot erythematous and swollen, as pictured below.       Data Reviewed:    Labs: Basic Metabolic Panel:  Recent Labs Lab 04/15/15 1145 04/15/15 1648 04/16/15 0432 04/17/15 0706  NA 134*  --  138 134*  K 4.1  --  3.3* 4.7  CL 95*  --  103 104  CO2 26  --  24 21*  GLUCOSE 594*  --  319* 294*  BUN 9  --  5* 8  CREATININE 0.91 0.71 0.70 0.64  CALCIUM 9.6  --  8.5* 8.3*   GFR CrCl cannot be calculated (Unknown ideal weight.). Liver Function Tests:  Recent Labs Lab 04/17/15 0706  AST 27  ALT 13*  ALKPHOS 83  BILITOT 0.8  PROT 5.9*  ALBUMIN 2.8*   No results for input(s): LIPASE, AMYLASE in the last 168 hours. No results for input(s): AMMONIA in the last 168 hours. Coagulation profile No results for input(s): INR, PROTIME in the last 168 hours.  CBC:  Recent Labs Lab 04/15/15 1145 04/15/15 1648 04/16/15 0432 04/17/15 0944  WBC 25.7* 25.3* 19.8* 11.7*  HGB 14.8 13.7  13.5 11.8*  HCT 45.1 41.6 40.5 36.8  MCV 90.7 89.5 89.6 90.2  PLT 328 270 247 242   CBG:  Recent Labs Lab 04/16/15 1152 04/16/15 1613 04/16/15 2138 04/17/15 0639 04/17/15 1124  GLUCAP 254* 268* 301* 267* 285*   Hgb A1c:  Recent Labs  04/15/15 1648  HGBA1C 11.2*   Sepsis Labs:  Recent Labs Lab 04/15/15 1145 04/15/15 1648 04/15/15 1953 04/16/15 0432 04/17/15 0944  WBC 25.7* 25.3*  --  19.8* 11.7*  LATICACIDVEN  --   --  1.9  --   --    Microbiology Recent Results (from the past 240 hour(s))  Urine culture     Status: None   Collection Time: 04/15/15 10:44 AM  Result Value Ref Range Status   Specimen Description URINE, CLEAN CATCH  Final   Special Requests NONE  Final   Culture MULTIPLE SPECIES PRESENT, SUGGEST RECOLLECTION  Final   Report Status 04/16/2015 FINAL  Final  Culture, blood (routine x 2)     Status: None (Preliminary result)    Collection Time: 04/15/15  4:30 PM  Result Value Ref Range Status   Specimen Description BLOOD LEFT ANTECUBITAL  Final   Special Requests   Final    BOTTLES DRAWN AEROBIC AND ANAEROBIC BLUE 10CC RED 5CC PATIENT ON FOLLOWING CLINDAMYCIN   Culture NO GROWTH < 24 HOURS  Final   Report Status PENDING  Incomplete  Culture, blood (routine x 2)     Status: None (Preliminary result)   Collection Time: 04/15/15  4:32 PM  Result Value Ref Range Status   Specimen Description BLOOD LEFT HAND  Final   Special Requests   Final    BOTTLES DRAWN AEROBIC ONLY 10CC PATIENT ON FOLLOWING CLINDAMYCIN   Culture NO GROWTH < 24 HOURS  Final   Report Status PENDING  Incomplete     Medications:   . clindamycin (CLEOCIN) IV  600 mg Intravenous 3 times per day  . enoxaparin (LOVENOX) injection  40 mg Subcutaneous Q24H  . gabapentin  800 mg Oral TID  . haloperidol lactate  2 mg Intravenous Once  . insulin aspart  0-15 Units Subcutaneous TID WC  . insulin aspart  0-5 Units Subcutaneous QHS  . insulin aspart  4 Units Subcutaneous TID WC  . insulin glargine  12 Units Subcutaneous BID  . LORazepam  1 mg Intravenous Once  . ondansetron (ZOFRAN) IV  4 mg Intravenous Once  . polyethylene glycol  17 g Oral Daily  . tiotropium  18 mcg Inhalation Daily   Continuous Infusions: . sodium chloride 50 mL/hr at 04/17/15 0908    Time spent: 35 minutes with > 50% of time discussing current diagnostic test results, clinical impression and plan of care.   LOS: 1 day   Signature  Lala Lund K M.D on 04/17/2015 at 1:17 PM  Between 7am to 7pm - Pager - [phone], After 7pm go to [email or web address] - password [facility]  [facility]  - Office  [phone]

## 2015-04-17 NOTE — Progress Notes (Signed)
Patient left the unit and could not be found. Security found the patient outside in wheelchair with friend. Dr. Candiss Norse informed me (The Director) to tell the patient if she left again he would dismiss her from the hospital and she would not be allowed back in.

## 2015-04-17 NOTE — Progress Notes (Deleted)
MD ordered, ativan and haldol. Pt refused both medications.

## 2015-04-17 NOTE — Clinical Documentation Improvement (Signed)
Hospitalist  (Please do not document query responses on the CDI BPA form.  Responses must be documented in the current medical record.)  Possible Clinical Conditions:  - Sepsis evolving or present on admission, resolved  - Sepsis not present on admission, but developed during the hospital stay  - The patient has not had Sepsis this admission  - Unable to clinically determine  Clinical Information/Indicators: Patient admitted with left lower extremity cellulitis, WBC on admission 25.7 Within approximately 24 hours of admission, patient spiked a fever to 103.3 oral, heart rate 103, respiratory rate of 28, no O2 sat recorded, Lactic Acid 1.9. Patient was treated with tylenol and 500 mls of IV fluid.  Please exercise your independent, professional judgment when responding. A specific answer is not anticipated or expected.   Thank You, Erling Conte  RN BSN CCDS [phone] Health Information Management [place]

## 2015-04-17 NOTE — Progress Notes (Signed)
Pt left against medical advise. MD made aware.

## 2015-04-17 NOTE — Progress Notes (Signed)
On 3/7, Pt co pain 10/10 with no relief from the PRN Oxycodone 20mg  nor Tylenol 650mg  already . Pt verbally asked RN to call Dr and see if she could have something more for pain. RN paged coverage and order was placed for 2mg  Morphine IV for one dose. RN administered. On reassessment, pt was still complaining of pain. Pt administered another PRN dose of oxy 20mg  at midnight. On reassessment pt asleep and easy to arouse. Will continue to monitor.

## 2015-04-17 NOTE — Discharge Summary (Signed)
AMA  Patient at this time expresses desire to leave the Hospital immidiately, patient has been warned that this is not Medically advisable at this time, and can result in Medical complications like Death and Disability, patient understands and accepts the risks involved and assumes full responsibilty of this decision.   [name] M.D on 04/17/2015 at 2:32 PM  [facility]    Last Note Below       Progress Note   Katie Guzman [id number] DOB: 07-16-1970 DOA: 04/15/2015 PCP: No PCP Per Patient   Brief Narrative:   Katie Guzman is an 45 y.o. female with a PMH of diabetes, COPD, diabetic neuropathy and fibromyalgia who was admitted 04/15/15 for treatment of left foot cellulitis and possible abscess. MRI of the left foot was negative for drainable abscess and findings consistent with ? Septic arthritis along with tenosynovitis.  Assessment/Plan:   Principal Problem:   Cellulitis with flexor hallucis longus tenosynovitis - Continue IV clindamycin. Monitor cultures, MRI noted, possible Tenosynovitis with ? Septic arthritis, on exam there is some fluctuance, have consulted Dr. Sharol Given for evaluation, patient may require I&D, stressed on Medication compliance and DM control. - Pending HIV antibody.   COPD (chronic obstructive pulmonary disease) (HCC) - Continue Spiriva. Ongoing smoking counseled to quit. Patient leaving the hospital premises to go out and smoke, found smoking in the bathroom.    Diabetic neuropathy (HCC) - Continue Neurontin.    Hypokalemia - Repleted.   Pain syndrome, chronic - consistently exhibiting narcotic seeking behavior, despite appearing absolutely comfortable insisting that she gets IV narcotics, found smoking in the parking lot and again in the room, patient tolerated, physically and  verbally abusive to the staff see various nursing notes from 04/16/2015 to 04/17/2015. [place] police and security also involved. Psych has been called to address her behavior. For now continue present Narcotic regimen.  Trying to avoid IV narcotics as she is in no distress and was walking to the parking lot to smoke. Patient reports chronic lower back pain for which she uses 10 mg of oxycodone every 4 hours   Diabetes mellitus ([name]), poorly controlled, with complications - Hemoglobin A1c is 11.2 corresponding to a mean plasma glucose of 275. Lantus dose adjusted, continue sliding scale and pre-meal NovoLog. Diabetic educator reconsulted. Patient counseled on compliance with medications. CBG (last 3)   Recent Labs  04/16/15 2138 04/17/15 0639 04/17/15 1124  GLUCAP 301* 267* 285*       DVT Prophylaxis - Lovenox ordered.   Family Communication/Anticipated D/C date and plan/Code Status   Family Communication: No family currently at the bedside. Disposition Plan/date: Home when afebrile 24 hours, pain improved, and can be safely transitioned to oral antibiotics. Code Status: Full code.   IV Access:    Peripheral IV   Procedures and diagnostic studies:   Mr Foot Left Wo Contrast  04/16/2015  CLINICAL DATA:  Left foot cellulitis and possible abscess. Erythema and induration especially around the great toe. EXAM: MRI OF THE LEFT FOREFOOT WITHOUT CONTRAST TECHNIQUE: Multiplanar, multisequence MR imaging was performed. No intravenous contrast was administered. COMPARISON:  04/15/2015 FINDINGS: Abnormal subcutaneous edema tracking along the dorsum of the foot,  and to a lesser extent in the subcutaneous tissues the plantar foot. The medial foot is also particularly affected. The subcutaneous edema extends into the great toe and there some cutaneous irregularity medially in the great toe at about the level of the interphalangeal joint. There is no current evidence of osteomyelitis. Trace  effusion first MTP joint, especially along the metatarsal sesamoid articulation. Trace tenosynovitis of the flexor hallucis longus distally. There is some confluent phlegmon dorsal to the first metatarsal head and superficial to the extensor hallucis longus tendon, image 20 series 6 and image 4 series 5, but this is not felt to represent an overt abscess. No drainable abscess identified. IMPRESSION: 1. Edema likely reflecting cellulitis of the dorsum of the foot and tracking especially into the great toe. There is some confluent phlegmon dorsal to the first MTP joint but without an overt drainable abscess. Trace effusion of the first MTP joint especially along the metatarsal sesamoid articulation but without the degree of synovitis that would typically be present in the setting of a septic joint. No findings of osteomyelitis. 2. Mild flexor hallucis longus tenosynovitis proximal to the sesamoids. Electronically Signed   By: Van Clines M.D.   On: 04/16/2015 06:42   Dg Foot Complete Left  04/15/2015  CLINICAL DATA:  reports increased pain in Lt foot up the whole LT leg. Pt has a LG black ulcer to Lt great toe for 2 weeks,hx DM EXAM: LEFT FOOT - COMPLETE 3+ VIEW COMPARISON:  08/17/2011 FINDINGS: There is soft tissue swelling ulceration along the medial aspect of the great toes. No radiodense foreign body. No osseous erosion of first ray. The midfoot is normal. IMPRESSION: 1. Soft tissue Ulceration along the medial aspect of the first digit. 2.  No acute osseous abnormality. Electronically Signed   By: Suzy Bouchard M.D.   On: 04/15/2015 11:15     Medical Consultants:    None.  Anti-Infectives:   Clindamycin 04/15/15--->  Subjective:    Katie Guzman is sitting comfortably in bed In, upon my arrival demanded that her pain medications be increased, no headache chest or abdominal pain, pains of left toe pain, also agrees that she has chronic pain and uses narcotics on a chronic basis.     Objective:    Filed Vitals:   04/16/15 2116 04/17/15 0100 04/17/15 0630 04/17/15 1241  BP: 141/83  116/68 153/88  Pulse: 98  88 83  Temp: 99.7 F (37.6 C) 97.2 F (36.2 C) 97.8 F (36.6 C) 98.7 F (37.1 C)  TempSrc:      Resp: 20  20 20   SpO2: 99%  98% 96%    Intake/Output Summary (Last 24 hours) at 04/17/15 1432 Last data filed at 04/17/15 0900  Gross per 24 hour  Intake    480 ml  Output      0 ml  Net    480 ml   There were no vitals filed for this visit.  Exam: Gen:  Tearful, restless, Appears older than stated age Cardiovascular:  RRR, No M/R/G Respiratory:  Lungs CTAB Gastrointestinal:  Abdomen soft, NT/ND, + BS Extremities:  Left foot erythematous and swollen, as pictured below.       Data Reviewed:    Labs: Basic Metabolic Panel:  Recent Labs Lab 04/15/15 1145 04/15/15 1648 04/16/15 0432 04/17/15 0706  NA 134*  --  138 134*  K 4.1  --  3.3* 4.7  CL 95*  --  103 104  CO2 26  --  24 21*  GLUCOSE 594*  --  319* 294*  BUN 9  --  5* 8  CREATININE 0.91 0.71 0.70 0.64  CALCIUM 9.6  --  8.5* 8.3*   GFR CrCl cannot be calculated (Unknown ideal weight.). Liver Function Tests:  Recent Labs Lab 04/17/15 0706  AST 27  ALT 13*  ALKPHOS 83  BILITOT 0.8  PROT 5.9*  ALBUMIN 2.8*   No results for input(s): LIPASE, AMYLASE in the last 168 hours. No results for input(s): AMMONIA in the last 168 hours. Coagulation profile No results for input(s): INR, PROTIME in the last 168 hours.  CBC:  Recent Labs Lab 04/15/15 1145 04/15/15 1648 04/16/15 0432 04/17/15 0944  WBC 25.7* 25.3* 19.8* 11.7*  HGB 14.8 13.7 13.5 11.8*  HCT 45.1 41.6 40.5 36.8  MCV 90.7 89.5 89.6 90.2  PLT 328 270 247 242   CBG:  Recent Labs Lab 04/16/15 1152 04/16/15 1613 04/16/15 2138 04/17/15 0639 04/17/15 1124  GLUCAP 254* 268* 301* 267* 285*   Hgb A1c:  Recent Labs  04/15/15 1648  HGBA1C 11.2*   Sepsis Labs:  Recent Labs Lab 04/15/15 1145  04/15/15 1648 04/15/15 1953 04/16/15 0432 04/17/15 0944  WBC 25.7* 25.3*  --  19.8* 11.7*  LATICACIDVEN  --   --  1.9  --   --    Microbiology Recent Results (from the past 240 hour(s))  Urine culture     Status: None   Collection Time: 04/15/15 10:44 AM  Result Value Ref Range Status   Specimen Description URINE, CLEAN CATCH  Final   Special Requests NONE  Final   Culture MULTIPLE SPECIES PRESENT, SUGGEST RECOLLECTION  Final   Report Status 04/16/2015 FINAL  Final  Culture, blood (routine x 2)     Status: None (Preliminary result)   Collection Time: 04/15/15  4:30 PM  Result Value Ref Range Status   Specimen Description BLOOD LEFT ANTECUBITAL  Final   Special Requests   Final    BOTTLES DRAWN AEROBIC AND ANAEROBIC BLUE 10CC RED 5CC PATIENT ON FOLLOWING CLINDAMYCIN   Culture NO GROWTH < 24 HOURS  Final   Report Status PENDING  Incomplete  Culture, blood (routine x 2)     Status: None (Preliminary result)   Collection Time: 04/15/15  4:32 PM  Result Value Ref Range Status   Specimen Description BLOOD LEFT HAND  Final   Special Requests   Final    BOTTLES DRAWN AEROBIC ONLY 10CC PATIENT ON FOLLOWING CLINDAMYCIN   Culture NO GROWTH < 24 HOURS  Final   Report Status PENDING  Incomplete     Medications:   . clindamycin (CLEOCIN) IV  600 mg Intravenous 3 times per day  . enoxaparin (LOVENOX) injection  40 mg Subcutaneous Q24H  . gabapentin  800 mg Oral TID  . haloperidol lactate  2 mg Intravenous Once  . insulin aspart  0-15 Units Subcutaneous TID WC  . insulin aspart  0-5 Units Subcutaneous QHS  . insulin aspart  4 Units Subcutaneous TID WC  . insulin glargine  12 Units Subcutaneous BID  . LORazepam  1 mg Intravenous Once  . nicotine  21 mg Transdermal Daily  . ondansetron (ZOFRAN) IV  4 mg Intravenous Once  . polyethylene glycol  17 g Oral Daily  . tiotropium  18 mcg Inhalation Daily   Continuous Infusions: . sodium chloride 50 mL/hr at 04/17/15 0908    Time spent:  35 minutes with > 50% of time discussing current diagnostic  test results, clinical impression and plan of care.   LOS: 1 day   Signature  Lala Lund K M.D on 04/17/2015 at 2:32 PM  Between 7am to 7pm - Pager - [phone], After 7pm go to [email or web address] - password [facility]  [facility]  - Office  [phone]

## 2015-04-20 LAB — CULTURE, BLOOD (ROUTINE X 2)
Culture: NO GROWTH
Culture: NO GROWTH

## 2015-05-09 ENCOUNTER — Ambulatory Visit: Payer: Self-pay | Admitting: Family Medicine

## 2015-07-27 ENCOUNTER — Emergency Department
Admission: EM | Admit: 2015-07-27 | Discharge: 2015-07-28 | Disposition: A | Discharge status: Home / Self Care | Payer: Medicaid Other | Attending: Emergency Medicine | Admitting: Emergency Medicine

## 2015-07-27 ENCOUNTER — Emergency Department: Payer: Medicaid Other

## 2015-07-27 DIAGNOSIS — Z7951 Long term (current) use of inhaled steroids: Secondary | ICD-10-CM | POA: Insufficient documentation

## 2015-07-27 DIAGNOSIS — F17210 Nicotine dependence, cigarettes, uncomplicated: Secondary | ICD-10-CM | POA: Insufficient documentation

## 2015-07-27 DIAGNOSIS — E119 Type 2 diabetes mellitus without complications: Secondary | ICD-10-CM | POA: Diagnosis not present

## 2015-07-27 DIAGNOSIS — L03116 Cellulitis of left lower limb: Secondary | ICD-10-CM | POA: Diagnosis not present

## 2015-07-27 DIAGNOSIS — E876 Hypokalemia: Secondary | ICD-10-CM | POA: Diagnosis not present

## 2015-07-27 DIAGNOSIS — Z794 Long term (current) use of insulin: Secondary | ICD-10-CM | POA: Diagnosis not present

## 2015-07-27 DIAGNOSIS — J449 Chronic obstructive pulmonary disease, unspecified: Secondary | ICD-10-CM | POA: Diagnosis not present

## 2015-07-27 DIAGNOSIS — M79672 Pain in left foot: Secondary | ICD-10-CM | POA: Diagnosis present

## 2015-07-27 LAB — BASIC METABOLIC PANEL
ANION GAP: 9 (ref 5–15)
BUN: 17 mg/dL (ref 6–20)
CO2: 25 mmol/L (ref 22–32)
Calcium: 9.2 mg/dL (ref 8.9–10.3)
Chloride: 102 mmol/L (ref 101–111)
Creatinine, Ser: 0.77 mg/dL (ref 0.44–1.00)
GFR calc Af Amer: >60 mL/min (ref >60)
GFR calc non Af Amer: >60 mL/min (ref >60)
GLUCOSE: 292 mg/dL — AB (ref 65–99)
POTASSIUM: 3.8 mmol/L (ref 3.5–5.1)
Sodium: 136 mmol/L (ref 135–145)

## 2015-07-27 LAB — SEDIMENTATION RATE: SED RATE: 3 mm/h (ref 0–20)

## 2015-07-27 LAB — CBC
HCT: 44.7 % (ref 35.0–47.0)
Hemoglobin: 14.9 g/dL (ref 12.0–16.0)
MCH: 27.8 pg (ref 26.0–34.0)
MCHC: 33.4 g/dL (ref 32.0–36.0)
MCV: 83.2 fL (ref 80.0–100.0)
Platelets: 311 10*3/uL (ref 150–440)
RBC: 5.38 MIL/uL — ABNORMAL HIGH (ref 3.80–5.20)
RDW: 15.8 % — AB (ref 11.5–14.5)
WBC: 11 10*3/uL (ref 3.6–11.0)

## 2015-07-27 MED ORDER — OXYCODONE-ACETAMINOPHEN 5-325 MG PO TABS
1.0000 | ORAL_TABLET | ORAL | Status: DC | PRN
  Administered 2015-07-27: 1 via ORAL
  Filled 2015-07-27: qty 1

## 2015-07-27 NOTE — ED Notes (Signed)
Pt reports recent 30 day hospitalization for infection in her left great toe/foot. Pt reports has been following up with a podiatrist and over the last few days having increasing pain, redness and swelling to the area. Pt reports she is diabetic. Left great toe is warm to touch with sensation intact and limited rom due to pain.

## 2015-07-28 ENCOUNTER — Emergency Department
Admission: EM | Admit: 2015-07-28 | Discharge: 2015-07-28 | Disposition: A | Discharge status: Home / Self Care | Payer: Medicaid Other | Source: Home / Self Care | Attending: Emergency Medicine | Admitting: Emergency Medicine

## 2015-07-28 ENCOUNTER — Emergency Department: Payer: Medicaid Other

## 2015-07-28 DIAGNOSIS — Z7984 Long term (current) use of oral hypoglycemic drugs: Secondary | ICD-10-CM

## 2015-07-28 DIAGNOSIS — Z79899 Other long term (current) drug therapy: Secondary | ICD-10-CM

## 2015-07-28 DIAGNOSIS — F17210 Nicotine dependence, cigarettes, uncomplicated: Secondary | ICD-10-CM | POA: Insufficient documentation

## 2015-07-28 DIAGNOSIS — E119 Type 2 diabetes mellitus without complications: Secondary | ICD-10-CM | POA: Insufficient documentation

## 2015-07-28 DIAGNOSIS — L03115 Cellulitis of right lower limb: Secondary | ICD-10-CM

## 2015-07-28 DIAGNOSIS — J441 Chronic obstructive pulmonary disease with (acute) exacerbation: Secondary | ICD-10-CM

## 2015-07-28 DIAGNOSIS — L03031 Cellulitis of right toe: Secondary | ICD-10-CM

## 2015-07-28 DIAGNOSIS — Z794 Long term (current) use of insulin: Secondary | ICD-10-CM

## 2015-07-28 LAB — CBC
HEMATOCRIT: 46.7 % (ref 35.0–47.0)
Hemoglobin: 15.2 g/dL (ref 12.0–16.0)
MCH: 28.1 pg (ref 26.0–34.0)
MCHC: 32.6 g/dL (ref 32.0–36.0)
MCV: 86 fL (ref 80.0–100.0)
Platelets: 281 10*3/uL (ref 150–440)
RBC: 5.42 MIL/uL — ABNORMAL HIGH (ref 3.80–5.20)
RDW: 15.7 % — AB (ref 11.5–14.5)
WBC: 10.7 10*3/uL (ref 3.6–11.0)

## 2015-07-28 LAB — COMPREHENSIVE METABOLIC PANEL
ALT: 17 U/L (ref 14–54)
AST: 21 U/L (ref 15–41)
Albumin: 4.1 g/dL (ref 3.5–5.0)
Alkaline Phosphatase: 89 U/L (ref 38–126)
Anion gap: 12 (ref 5–15)
BILIRUBIN TOTAL: 0.6 mg/dL (ref 0.3–1.2)
BUN: 12 mg/dL (ref 6–20)
CO2: 23 mmol/L (ref 22–32)
CREATININE: 0.78 mg/dL (ref 0.44–1.00)
Calcium: 9.2 mg/dL (ref 8.9–10.3)
Chloride: 96 mmol/L — ABNORMAL LOW (ref 101–111)
Glucose, Bld: 514 mg/dL (ref 65–99)
POTASSIUM: 3.4 mmol/L — AB (ref 3.5–5.1)
Sodium: 131 mmol/L — ABNORMAL LOW (ref 135–145)
TOTAL PROTEIN: 7 g/dL (ref 6.5–8.1)

## 2015-07-28 MED ORDER — VANCOMYCIN HCL IN DEXTROSE 1-5 GM/200ML-% IV SOLN
1000.0000 mg | Freq: Once | INTRAVENOUS | Status: AC
  Administered 2015-07-28: 1000 mg via INTRAVENOUS
  Filled 2015-07-28: qty 200

## 2015-07-28 MED ORDER — HYDROMORPHONE HCL 1 MG/ML IJ SOLN
1.0000 mg | Freq: Once | INTRAMUSCULAR | Status: AC
  Administered 2015-07-28: 1 mg via INTRAVENOUS
  Filled 2015-07-28: qty 1

## 2015-07-28 MED ORDER — CLINDAMYCIN HCL 300 MG PO CAPS: 300.0000 mg | ORAL_CAPSULE | Freq: Three times a day (TID) | ORAL | Status: DC

## 2015-07-28 MED ORDER — OXYCODONE-ACETAMINOPHEN 5-325 MG PO TABS: 1.0000 | ORAL_TABLET | Freq: Four times a day (QID) | ORAL | Status: DC | PRN

## 2015-07-28 NOTE — ED Notes (Signed)
Patient presents to the ED with left foot pain, swelling and redness with a raised circular area to left foot.  Patient reports that area first became an issue in March and patient was hospitalized.  Patient reports that she is a Type 2 diabetic.  Patient states she was seen yesterday for the same problem and was told if her pain got worse or area became redder to come back and patient states that both things have happened.  Patient denies fever, denies nausea and vomiting.

## 2015-07-28 NOTE — ED Notes (Signed)
Reviewed d/c instruction, prescription, and follow-up care with pt. Pt verbalized understanding. Pt requesting pain medication prescription. RN informed pt she would alert MD.

## 2015-07-28 NOTE — ED Notes (Signed)
Pt found in hallway, pulling IV pole behind her. Pt reminded to stay in bed by Judson Roch, NT. Pt reports she will stay in bed in room

## 2015-07-28 NOTE — ED Notes (Signed)
Pt given water to drink as requested and allowed by MD; pt understands MD waiting for a consulting physician to return his call regarding another pt and then he plans to come assess her

## 2015-07-28 NOTE — ED Notes (Signed)
Pt advised that she needed to remain in bed after pain medication administration for safety purposes. Pt was shown call bell. Pt reported she would alert RN if she needed to get up for any reason

## 2015-07-28 NOTE — ED Notes (Signed)
Pt reports hx of type 2 diabetes. Pt has 2 wounds to first toe left foot on dorsal and lateral aspects. Dorsal wound, raised, reddened, with white covering. Pt reports pain radiates up foot and leg.  Wound first appeared in March. Pt went to [facility] and received IV antibiotics, and kept at hospital for approx 1 month. Wound started to worsen end of May, and has continued to worsen since

## 2015-07-28 NOTE — ED Provider Notes (Signed)
[facility] Emergency Department Provider Note   ____________________________________________  Time seen: Approximately 0028 AM  I have reviewed the triage vital signs and the nursing notes.   HISTORY  Chief Complaint Wound Infection and Foot Pain    HPI Katie Guzman is a 45 y.o. female who comes into the hospital today with some swelling, pain and redness to herleft big toe. The patient reports that back in March she came in and her leg was swollen. The patient had streaking and reports that she had a callus that was causing these problems. The patient reports that her toe was very infected. She went to [facility] and had tests done and was told that she needed to have her toe amputated. The patient reports that she decided to leave [facility] and went to [facility]. She stayed there for a month and had IV antibiotics and was discharged home on clindamycin. She reports that she was doing well and completed her course of clindamycin but reports that the swelling returned a couple of weeks ago. Her toe was painful and red. She reports that her podiatrist told her if it became like this needed to come straight into the emergency department. The patient last saw her podiatrist before Mother's Day and reports that she missed her most recent appointment. She reports it is been scheduled to the end of the month. The patient reports that her pain as a 7 out of 10 in intensity. The patient reports that it is very painful and the pain shoots into her ankle as well as under her foot. The patient is concerned about an infection in her bones and she decided to come in and get checked out.   Past Medical History  Diagnosis Date  . Diabetes mellitus     Poorly controlled with complications  . COPD (chronic obstructive pulmonary disease) ([place])   . Emphysema   . Neuropathy ([place])   . Fibromyalgia   . Tenosynovitis of foot 04/16/2015  . Cellulitis 04/15/2015     Patient Active Problem List   Diagnosis Date Noted  . 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 Tenosynovitis of foot 04/16/2015  . Hypokalemia 04/16/2015  . Cellulitis 04/15/2015  . Diabetes mellitus ([place]) 04/15/2015  . Pain syndrome, chronic 04/15/2015  . COPD (chronic obstructive pulmonary disease) ([place]) 04/15/2015  . Diabetic neuropathy ([place]) 04/15/2015  . Diabetes mellitus with complication ([place])   . COPD exacerbation ([place]) 10/19/2014    Past Surgical History  Procedure Laterality Date  . Back surgery    . Tubal ligation    . Cesarean section    . Tonsillectomy      Current Outpatient Rx  Name  Route  Sig  Dispense  Refill  . acetaminophen (TYLENOL) 325 MG tablet   Oral   Take 650 mg by mouth every 6 (six) hours as needed for mild pain.         Marland Kitchen albuterol (PROVENTIL HFA;VENTOLIN HFA) 108 (90 BASE) MCG/ACT inhaler   Inhalation   Inhale 2 puffs into the lungs every 4 (four) hours as needed for wheezing or shortness of breath. For wheeze or shortness of breath   1 Inhaler   0   . albuterol (PROVENTIL) (2.5 MG/3ML) 0.083% nebulizer solution   Nebulization   Take 2.5 mg by nebulization every 4 (four) hours as needed for wheezing or shortness of breath.          . fluticasone-salmeterol (ADVAIR HFA) 115-21  MCG/ACT inhaler   Inhalation   Inhale 2 puffs into the lungs 2 (two) times daily.   1 Inhaler   0   . gabapentin (NEURONTIN) 400 MG capsule   Oral   Take 2 capsules (800 mg total) by mouth 3 (three) times daily. Patient taking differently: Take 1,200 mg by mouth 3 (three) times daily.    180 capsule   0   . ibuprofen (ADVIL,MOTRIN) 600 MG tablet   Oral   Take 600 mg by mouth every 6 (six) hours as needed for fever or mild pain.         Marland Kitchen insulin aspart (NOVOLOG) 100 UNIT/ML injection   Subcutaneous   Inject 10 Units into the skin 3 (three) times daily with meals.   1 vial   0   . insulin glargine (LANTUS) 100 UNIT/ML  injection   Subcutaneous   Inject 0.25 mLs (25 Units total) into the skin 2 (two) times daily. Patient taking differently: Inject 28 Units into the skin 2 (two) times daily.    1 vial   0   . lidocaine (LIDODERM) 5 %   Transdermal   Place 1 patch onto the skin daily as needed. For pain. Remove & Discard patch within 12 hours or as directed by MD         . metFORMIN (GLUCOPHAGE) 500 MG tablet   Oral   Take 1 tablet (500 mg total) by mouth 2 (two) times daily with a meal. Patient taking differently: Take 500 mg by mouth daily.    60 tablet   0   . tiotropium (SPIRIVA) 18 MCG inhalation capsule   Inhalation   Place 1 capsule (18 mcg total) into inhaler and inhale daily.   30 capsule   0   . clindamycin (CLEOCIN) 300 MG capsule   Oral   Take 1 capsule (300 mg total) by mouth 3 (three) times daily.   30 capsule   0   . guaiFENesin-dextromethorphan (ROBITUSSIN DM) 100-10 MG/5ML syrup   Oral   Take 10 mLs by mouth every 6 (six) hours as needed for cough. Patient not taking: Reported on 07/28/2015   118 mL   0   . Influenza vac split quadrivalent PF (FLUARIX) 0.5 ML injection   Intramuscular   Inject 0.5 mLs into the muscle tomorrow at 10 am. Patient not taking: Reported on 04/15/2015   0.5 mL   0   . nicotine (NICODERM CQ - DOSED IN MG/24 HOURS) 21 mg/24hr patch   Transdermal   Place 1 patch (21 mg total) onto the skin daily. Patient not taking: Reported on 07/28/2015   28 patch   0   . ondansetron (ZOFRAN) 4 MG tablet   Oral   Take 1 tablet (4 mg total) by mouth every 8 (eight) hours as needed for nausea or vomiting. Patient not taking: Reported on 10/19/2014   10 tablet   0   . oxyCODONE-acetaminophen (ROXICET) 5-325 MG tablet   Oral   Take 1 tablet by mouth every 6 (six) hours as needed. Patient taking differently: Take 1 tablet by mouth every 6 (six) hours as needed. For pain.   15 tablet   0   . pneumococcal 23 valent vaccine (PNU-IMMUNE) 25 MCG/0.5ML  injection   Intramuscular   Inject 0.5 mLs into the muscle tomorrow at 10 am. Patient not taking: Reported on 04/15/2015   2.5 mL   0   . predniSONE (STERAPRED UNI-PAK 21 TAB) 10 MG (21)  TBPK tablet   Oral   Take 1 tablet (10 mg total) by mouth daily. Take 6 tablets by mouth for 1 day followed by  5 tablets by mouth for 1 day followed by  4 tablets by mouth for 1 day followed by  3 tablets by mouth for 1 day followed by  2 tablets by mouth for 1 day followed by  1 tablet by mouth for a day and stop Patient not taking: Reported on 04/15/2015   21 tablet   0     Allergies Hydrocodone; Darvocet; Fish-derived products; Flexeril; Propofol; Toradol; and Vicodin  Family History  Problem Relation Age of Onset  . Lung cancer Mother     Social History Social History  Substance Use Topics  . Smoking status: Current Some Day Smoker -- 0.50 packs/day for 30 years    Types: Cigarettes    Last Attempt to Quit: 10/05/2014  . Smokeless tobacco: Not on file  . Alcohol Use: No    Review of Systems Constitutional: No fever/chills Eyes: No visual changes. ENT: No sore throat. Cardiovascular: Denies chest pain. Respiratory: Denies shortness of breath. Gastrointestinal: No abdominal pain.  No nausea, no vomiting.  No diarrhea.  No constipation. Genitourinary: Negative for dysuria. Musculoskeletal: Negative for back pain. Skin: Negative for rash. Neurological: Negative for headaches, focal weakness or numbness.  10-point ROS otherwise negative.  ____________________________________________   PHYSICAL EXAM:  VITAL SIGNS: ED Triage Vitals  Enc Vitals Group     BP 07/27/15 2152 118/90 mmHg     Pulse Rate 07/27/15 2152 96     Resp 07/27/15 2152 18     Temp 07/27/15 2152 98 F (36.7 C)     Temp Source 07/27/15 2152 Oral     SpO2 07/27/15 2152 98 %     Weight 07/27/15 2152 165 lb (74.844 kg)     Height 07/27/15 2152 5\' 4"  (1.626 m)     Head Cir --      Peak Flow --      Pain  Score 07/27/15 2152 7     Pain Loc --      Pain Edu? --      Excl. in [place]? --     Constitutional: Alert and oriented. Well appearing and in Moderate distress. Eyes: Conjunctivae are normal. PERRL. EOMI. Head: Atraumatic. Nose: No congestion/rhinnorhea. Mouth/Throat: Mucous membranes are moist.  Oropharynx non-erythematous. Cardiovascular: Normal rate, regular rhythm. Grossly normal heart sounds.  Good peripheral circulation. Respiratory: Normal respiratory effort.  No retractions. Lungs CTAB. Gastrointestinal: Soft and nontender. No distention.  Musculoskeletal: Mild erythema to left great toe. The patient does have a callus to the top of her toe as well as to the plantar surface of her toe. There is no significant drainage and minimal swelling at this time. There is no streaking or redness any further than the base of her toe. Neurologic:  Normal speech and language.  Skin:  Skin is warm, dry and intact.  Psychiatric: Mood and affect are normal.   ____________________________________________   LABS (all labs ordered are listed, but only abnormal results are displayed)  Labs Reviewed  CBC - Abnormal; Notable for the following:    RBC 5.38 (*)    RDW 15.8 (*)    All other components within normal limits  BASIC METABOLIC PANEL - Abnormal; Notable for the following:    Glucose, Bld 292 (*)    All other components within normal limits  SEDIMENTATION RATE   ____________________________________________  EKG  None ____________________________________________  RADIOLOGY  Left foot x-ray: Negative ____________________________________________   PROCEDURES  Procedure(s) performed: None  Critical Care performed: No  ____________________________________________   INITIAL IMPRESSION / ASSESSMENT AND PLAN / ED COURSE  Pertinent labs & imaging results that were available during my care of the patient were reviewed by me and considered in my medical decision making (see chart  for details).  This is a 45 year old female who comes into the hospital today with some redness and swelling to her toe. The patient's concerned about an infection. Given the patient's history of diabetes and neuropathy as well as her previous toe infection I will give the patient a dose of vancomycin. The patient's white blood cell count is 11 and the toe has some minimal erythema at this time. I did give the patient a dose of Dilaudid as well. I feel that the patient needs to follow back up with the podiatrist. The patient will receive clindamycin for home and should resume taking her pain medicine at home. The patient will be discharged from the hospital to follow-up with Dr. Elvina Mattes. ____________________________________________   FINAL CLINICAL IMPRESSION(S) / ED DIAGNOSES  Final diagnoses:  Cellulitis of left lower extremity      NEW MEDICATIONS STARTED DURING THIS VISIT:  New Prescriptions   CLINDAMYCIN (CLEOCIN) 300 MG CAPSULE    Take 1 capsule (300 mg total) by mouth 3 (three) times daily.     Note:  This document was prepared using Dragon voice recognition software and may include unintentional dictation errors.    Loney Hering, MD 07/28/15 [phone]

## 2015-07-28 NOTE — Discharge Instructions (Signed)

## 2015-07-28 NOTE — ED Notes (Signed)
Patient took novalog 7 units at 0830 AM. Did not take her metformin. Patient did not have her lantus last night.

## 2015-07-28 NOTE — ED Provider Notes (Signed)
[facility] Emergency Department Provider Note  Time seen: 7:37 PM  I have reviewed the triage vital signs and the nursing notes.   HISTORY  Chief Complaint Foot Pain    HPI Katie Guzman is a 45 y.o. female with a past medical history of diabetes, COPD, for myalgia who presents the emergency department with left foot pain and swelling. According to the patient she has a history of significant cellulitis and left lower extremity. States for the past 3-4 days she has been experiencing redness pain with mild swelling to the left great toe. Patient seen yesterday in the emergency department diagnosed with likely cellulitis, x-ray negative for osteomyelitis. Patient discharged clindamycin. Patient states the pain is worse and so she came to the emergency department today for reevaluation. Patient remains afebrile, denies nausea or vomiting. States pain which is moderate, dull aching much worse when ambulating. Patient states she is scared because she almost needed the foot amputated when it got infected last time.     Past Medical History  Diagnosis Date  . Diabetes mellitus     Poorly controlled with complications  . COPD (chronic obstructive pulmonary disease) ([place])   . Emphysema   . Neuropathy ([place])   . Fibromyalgia   . Tenosynovitis of foot 04/16/2015  . Cellulitis 04/15/2015    Patient Active Problem List   Diagnosis Date Noted  . 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 Tenosynovitis of foot 04/16/2015  . Hypokalemia 04/16/2015  . Cellulitis 04/15/2015  . Diabetes mellitus ([place]) 04/15/2015  . Pain syndrome, chronic 04/15/2015  . COPD (chronic obstructive pulmonary disease) ([place]) 04/15/2015  . Diabetic neuropathy ([place]) 04/15/2015  . Diabetes mellitus with complication ([place])   . COPD exacerbation ([place]) 10/19/2014    Past Surgical History  Procedure Laterality Date  . Back surgery    . Tubal ligation    . Cesarean  section    . Tonsillectomy      Current Outpatient Rx  Name  Route  Sig  Dispense  Refill  . acetaminophen (TYLENOL) 325 MG tablet   Oral   Take 650 mg by mouth every 6 (six) hours as needed for mild pain.         Marland Kitchen albuterol (PROVENTIL HFA;VENTOLIN HFA) 108 (90 BASE) MCG/ACT inhaler   Inhalation   Inhale 2 puffs into the lungs every 4 (four) hours as needed for wheezing or shortness of breath. For wheeze or shortness of breath   1 Inhaler   0   . albuterol (PROVENTIL) (2.5 MG/3ML) 0.083% nebulizer solution   Nebulization   Take 2.5 mg by nebulization every 4 (four) hours as needed for wheezing or shortness of breath.          . clindamycin (CLEOCIN) 300 MG capsule   Oral   Take 1 capsule (300 mg total) by mouth 3 (three) times daily.   30 capsule   0   . fluticasone-salmeterol (ADVAIR HFA) 115-21 MCG/ACT inhaler   Inhalation   Inhale 2 puffs into the lungs 2 (two) times daily.   1 Inhaler   0   . gabapentin (NEURONTIN) 400 MG capsule   Oral   Take 2 capsules (800 mg total) by mouth 3 (three) times daily. Patient taking differently: Take 1,200 mg by mouth 3 (three) times daily.    180 capsule   0   . guaiFENesin-dextromethorphan (ROBITUSSIN DM) 100-10 MG/5ML syrup   Oral   Take 10 mLs by mouth every  6 (six) hours as needed for cough. Patient not taking: Reported on 07/28/2015   118 mL   0   . ibuprofen (ADVIL,MOTRIN) 600 MG tablet   Oral   Take 600 mg by mouth every 6 (six) hours as needed for fever or mild pain.         . Influenza vac split quadrivalent PF (FLUARIX) 0.5 ML injection   Intramuscular   Inject 0.5 mLs into the muscle tomorrow at 10 am. Patient not taking: Reported on 04/15/2015   0.5 mL   0   . insulin aspart (NOVOLOG) 100 UNIT/ML injection   Subcutaneous   Inject 10 Units into the skin 3 (three) times daily with meals.   1 vial   0   . insulin glargine (LANTUS) 100 UNIT/ML injection   Subcutaneous   Inject 0.25 mLs (25 Units total)  into the skin 2 (two) times daily. Patient taking differently: Inject 28 Units into the skin 2 (two) times daily.    1 vial   0   . lidocaine (LIDODERM) 5 %   Transdermal   Place 1 patch onto the skin daily as needed. For pain. Remove & Discard patch within 12 hours or as directed by MD         . metFORMIN (GLUCOPHAGE) 500 MG tablet   Oral   Take 1 tablet (500 mg total) by mouth 2 (two) times daily with a meal. Patient taking differently: Take 500 mg by mouth daily.    60 tablet   0   . nicotine (NICODERM CQ - DOSED IN MG/24 HOURS) 21 mg/24hr patch   Transdermal   Place 1 patch (21 mg total) onto the skin daily. Patient not taking: Reported on 07/28/2015   28 patch   0   . ondansetron (ZOFRAN) 4 MG tablet   Oral   Take 1 tablet (4 mg total) by mouth every 8 (eight) hours as needed for nausea or vomiting. Patient not taking: Reported on 10/19/2014   10 tablet   0   . oxyCODONE-acetaminophen (ROXICET) 5-325 MG tablet   Oral   Take 1 tablet by mouth every 6 (six) hours as needed. Patient taking differently: Take 1 tablet by mouth every 6 (six) hours as needed. For pain.   15 tablet   0   . pneumococcal 23 valent vaccine (PNU-IMMUNE) 25 MCG/0.5ML injection   Intramuscular   Inject 0.5 mLs into the muscle tomorrow at 10 am. Patient not taking: Reported on 04/15/2015   2.5 mL   0   . predniSONE (STERAPRED UNI-PAK 21 TAB) 10 MG (21) TBPK tablet   Oral   Take 1 tablet (10 mg total) by mouth daily. Take 6 tablets by mouth for 1 day followed by  5 tablets by mouth for 1 day followed by  4 tablets by mouth for 1 day followed by  3 tablets by mouth for 1 day followed by  2 tablets by mouth for 1 day followed by  1 tablet by mouth for a day and stop Patient not taking: Reported on 04/15/2015   21 tablet   0   . tiotropium (SPIRIVA) 18 MCG inhalation capsule   Inhalation   Place 1 capsule (18 mcg total) into inhaler and inhale daily.   30 capsule   0      Allergies Hydrocodone; Darvocet; Fish-derived products; Flexeril; Propofol; Toradol; and Vicodin  Family History  Problem Relation Age of Onset  . Lung cancer Mother  Social History Social History  Substance Use Topics  . Smoking status: Current Some Day Smoker -- 0.50 packs/day for 30 years    Types: Cigarettes    Last Attempt to Quit: 10/05/2014  . Smokeless tobacco: Not on file  . Alcohol Use: No    Review of Systems Constitutional: Negative for fever. Cardiovascular: Negative for chest pain. Respiratory: Negative for shortness of breath. Gastrointestinal: Negative for abdominal pain Musculoskeletal: Left great toe pain Skin: Negative for rash. Mild erythema of the left fifth toe/fifth MT 10-point ROS otherwise negative.  ____________________________________________   PHYSICAL EXAM:  VITAL SIGNS: ED Triage Vitals  Enc Vitals Group     BP 07/28/15 1740 173/100 mmHg     Pulse Rate 07/28/15 1739 95     Resp 07/28/15 1739 16     Temp 07/28/15 1739 98.1 F (36.7 C)     Temp Source 07/28/15 1739 Oral     SpO2 07/28/15 1739 95 %     Weight 07/28/15 1739 165 lb (74.844 kg)     Height 07/28/15 1739 5\' 4"  (1.626 m)     Head Cir --      Peak Flow --      Pain Score 07/28/15 1740 8     Pain Loc --      Pain Edu? --      Excl. in [place]? --     Constitutional: Alert and oriented. Well appearing and in no distress. Eyes: Normal exam ENT   Head: Normocephalic and atraumatic.   Mouth/Throat: Mucous membranes are moist. Cardiovascular: Normal rate, regular rhythm. No murmur Respiratory: Normal respiratory effort without tachypnea nor retractions. Breath sounds are clear  Gastrointestinal: Soft and nontender. No distention.  Musculoskeletal: Right great toe moderate tenderness to palpation over the MTP joint, mild erythema, appears calcified, no fluctuance. Neurologic:  Normal speech and language. No gross focal neurologic deficits  Skin:  Skin is warm, dry  and intact.  Psychiatric: Mood and affect are normal.   ____________________________________________   RADIOLOGY  X-ray negative   INITIAL IMPRESSION / ASSESSMENT AND PLAN / ED COURSE  Pertinent labs & imaging results that were available during my care of the patient were reviewed by me and considered in my medical decision making (see chart for details).  Patient presents for an infection of the right great toe. Area appears to be chronically calcified, mild to moderate tenderness of the area. X-ray negative for osteomyelitis. Possible early cellulitis. Patient taking clindamycin prescribed yesterday. Patient states she did not get home until 3:00 in the morning, did not take her nightly Lantus, has not taken her diabetic medications today which is why her blood glucose is elevated. Patient's labs show a normal white blood cell count. Negative x-ray. I believe the patient can be adequately managed with clindamycin orally. We'll add Percocet for pain control. I referred to the wound management center as well as orthopedics. I discussed the patient's blood glucose management such as placing an IV insulin and IV fluids, the patient states she did not take her diabetic medications today and would preferred to go home and take her medications at home, if her blood glucose remains elevated she states she will come to the emergency department.  ____________________________________________   FINAL CLINICAL IMPRESSION(S) / ED DIAGNOSES  Cellulitis   Harvest Dark, MD 07/28/15 [phone]

## 2015-07-29 ENCOUNTER — Encounter: Payer: Self-pay | Admitting: Podiatry

## 2015-07-29 ENCOUNTER — Ambulatory Visit: Payer: Self-pay

## 2015-07-29 ENCOUNTER — Ambulatory Visit (INDEPENDENT_AMBULATORY_CARE_PROVIDER_SITE_OTHER): Payer: Medicaid Other

## 2015-07-29 ENCOUNTER — Ambulatory Visit (INDEPENDENT_AMBULATORY_CARE_PROVIDER_SITE_OTHER): Payer: Medicaid Other | Admitting: Podiatry

## 2015-07-29 VITALS — BP 132/92 | HR 69 | Resp 12

## 2015-07-29 DIAGNOSIS — L97521 Non-pressure chronic ulcer of other part of left foot limited to breakdown of skin: Secondary | ICD-10-CM | POA: Diagnosis not present

## 2015-07-29 DIAGNOSIS — M6740 Ganglion, unspecified site: Secondary | ICD-10-CM

## 2015-07-29 MED ORDER — GABAPENTIN 800 MG PO TABS: ORAL_TABLET | ORAL | Status: DC

## 2015-07-29 MED ORDER — TRAMADOL HCL 50 MG PO TABS: 50.0000 mg | ORAL_TABLET | Freq: Four times a day (QID) | ORAL | Status: DC | PRN

## 2015-07-29 MED ORDER — CLINDAMYCIN HCL 300 MG PO CAPS: 300.0000 mg | ORAL_CAPSULE | Freq: Three times a day (TID) | ORAL | Status: DC

## 2015-07-29 NOTE — Progress Notes (Signed)
   Subjective:    Patient ID: Katie Guzman, female    DOB: 1970-03-25, 45 y.o.   MRN: [id number]  HPI: She presents today with a 6 month history of a painful nodule to the dorsal aspect of the left foot. She states that this came about after having been in the hospital and receiving antibiotics for cellulitis of the left leg. She states that she started to develop blisters at that time and this was a final result of the blisters of the left foot. She states that since that time she has been hospitalized twice for cellulitis extending from a mild abrasion in the plantar aspect of the hallux interphalangeal joint left foot. She states that her feet hurt so bad that she can hardly walk as she begins to cry. She states that on tired of hurting and this nodule is extremely painful and she points to the dorsal aspect of the left foot. This nodule is what is in the hospital yesterday she states. She states that it was extremely painful she went to the hospital and blood work was performed. She has diabetes that is not well controlled and blood sugars yesterday were greater than 500. She's now been placed on clindamycin. She takes gabapentin for pain. She states [facility] hospitals drained and packed this lesion 1 time but never performed cultures. Does not currently have a internal medicine doctor to control her diabetes.  Review of Systems  Musculoskeletal: Positive for gait problem.  Skin: Positive for color change.       Objective:   Physical Exam: Vital signs are stable she's alert and oriented 3 pulses are strongly palpable dorsalis pedis and PT bilateral. Capillary fill time is diminished. Neurologic sensorium is diminished versus Weinstein monofilament her feet are extremely painful to touch. Deep tendon reflexes are intact and muscle strength is normal. Orthopedic evaluation demonstrates painful range of motion first metatarsophalangeal joint left which demonstrates a fluctuant  mass to the dorsal aspect of the first metatarsophalangeal joint with no opening no purulence no malodor and no cellulitis. She has a small healed ulcerative lesion plantar aspect of the hallux interphalangeal joint left. Thick yellow dystrophic onychomycotic nails dry xerotic skin. Radiographs today do not demonstrate any type of osseus abnormalities soft tissue mass dorsal aspect of the first metatarsophalangeal joint does not demonstrate any type of calcification.        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  Plan: Currently I increased her gabapentin to greater than 3600 mg a day. I also provided her with tramadol. I also increased the duration of her clindamycin. I highly recommended that she follow-up with internal medicine to decrease her blood sugars because the only way this abscess or lesion will be removed will be an emergency surgery. No one is going to perform elective surgery on her with blood sugars greater than 500. She will follow up with me in next 3-4 weeks or sooner if needed.

## 2015-08-14 ENCOUNTER — Ambulatory Visit (INDEPENDENT_AMBULATORY_CARE_PROVIDER_SITE_OTHER): Payer: Medicaid Other | Admitting: Podiatry

## 2015-08-14 ENCOUNTER — Encounter: Payer: Self-pay | Admitting: Podiatry

## 2015-08-14 DIAGNOSIS — M6740 Ganglion, unspecified site: Secondary | ICD-10-CM

## 2015-08-14 MED ORDER — OXYCODONE-ACETAMINOPHEN 10-325 MG PO TABS: 1.0000 | ORAL_TABLET | Freq: Three times a day (TID) | ORAL | Status: DC | PRN

## 2015-08-14 NOTE — Addendum Note (Signed)
Addended by: Clovis Riley E on: 08/14/2015 01:33 PM   Modules accepted: Orders

## 2015-08-14 NOTE — Progress Notes (Signed)
She presents today and states that her foot is doing so much worse than it was previously and she states that her blood sugars are now at 182 low 200s. She states that her tramadol did not help at all and the increasing her gabapentin has failed to alleviate any of her symptoms.  Objective: Vital signs are stable she is alert and oriented 3 no erythema edema cellulitis drainage or odor abscess/cyst first metatarsophalangeal joint left is still present and painful range of motion. Pulses are palpable.  Assessment: Abscess/cyst first metatarsal joint left foot.  Plan: I'm requesting an MRI with contrast. BUN/creatinine creatinine are within normal limits per a recent blood work from middle of June. Once the MRI is returned we will consider surgical intervention.

## 2015-08-16 NOTE — Addendum Note (Signed)
Addended by: Lolita Rieger on: 08/16/2015 03:57 PM   Modules accepted: Orders

## 2015-08-19 ENCOUNTER — Telehealth: Payer: Self-pay | Admitting: *Deleted

## 2015-08-19 NOTE — Telephone Encounter (Addendum)
[place] AUTHORIZED MRI LEFT FOOT WITH AND WITHOUT CONTRAST, AUTHORIZATION ID:  [id number], VALID 08/16/2015 EXPIRES 09/15/2015.  FAXED TO [place].  LEFT MESSAGE INFORMING PT OF THE [facility] [place]. 09/17/2015-Dr. Hyatt states MRI is normal and there is no abscess or bone infection. Unable to leave a message, phone states welcome to the conference but doesn' t have prompts to leave a message.

## 2015-08-30 ENCOUNTER — Ambulatory Visit: Admission: RE | Admit: 2015-08-30 | Payer: Medicaid Other | Source: Ambulatory Visit

## 2015-09-13 ENCOUNTER — Ambulatory Visit
Admission: RE | Admit: 2015-09-13 | Discharge: 2015-09-13 | Disposition: A | Discharge status: Home / Self Care | Payer: Medicaid Other | Source: Ambulatory Visit | Attending: Podiatry | Admitting: Podiatry

## 2015-09-13 DIAGNOSIS — M6740 Ganglion, unspecified site: Secondary | ICD-10-CM | POA: Insufficient documentation

## 2015-09-13 DIAGNOSIS — M25475 Effusion, left foot: Secondary | ICD-10-CM | POA: Insufficient documentation

## 2015-09-13 MED ORDER — GADOBENATE DIMEGLUMINE 529 MG/ML IV SOLN
15.0000 mL | Freq: Once | INTRAVENOUS | Status: AC | PRN
  Administered 2015-09-13: 15 mL via INTRAVENOUS

## 2015-09-17 NOTE — Telephone Encounter (Signed)
-----   Message from Garrel Ridgel, [place] sent at 09/16/2015  7:05 AM EDT ----- MRI is NORMAL.  No abscesses or mass or bone infection.

## 2015-09-23 ENCOUNTER — Telehealth: Payer: Self-pay | Admitting: *Deleted

## 2015-09-23 ENCOUNTER — Telehealth: Payer: Self-pay | Admitting: Podiatry

## 2015-09-23 NOTE — Telephone Encounter (Signed)
Patient called said that she needs a refill on he roxycodone pain med.

## 2015-09-23 NOTE — Telephone Encounter (Addendum)
Pt request refill of roxycodone. Unable to leave a message phone states welcome to the conference.

## 2015-09-23 NOTE — Telephone Encounter (Signed)
Ok

## 2015-09-24 ENCOUNTER — Other Ambulatory Visit: Payer: Self-pay | Admitting: Podiatry

## 2015-09-24 ENCOUNTER — Telehealth: Payer: Self-pay | Admitting: Podiatry

## 2015-09-24 MED ORDER — OXYCODONE-ACETAMINOPHEN 10-325 MG PO TABS: 1.0000 | ORAL_TABLET | Freq: Three times a day (TID) | ORAL | 0 refills | Status: DC | PRN

## 2015-09-24 NOTE — Telephone Encounter (Signed)
Patient called yesterday 09/23/2015 for a refill on her Oxcycodone. Hasnt heard back from a nurse and the pharmacy doesn't have her refill request. Would like for someone to call her about this refill. Phone number was incorrect, and has now been updated. Katie Guzman

## 2015-10-21 ENCOUNTER — Encounter: Payer: Self-pay | Admitting: Podiatry

## 2015-10-21 ENCOUNTER — Ambulatory Visit (INDEPENDENT_AMBULATORY_CARE_PROVIDER_SITE_OTHER): Payer: Medicaid Other | Admitting: Podiatry

## 2015-10-21 DIAGNOSIS — M6740 Ganglion, unspecified site: Secondary | ICD-10-CM | POA: Diagnosis not present

## 2015-10-21 MED ORDER — OXYCODONE-ACETAMINOPHEN 10-325 MG PO TABS: 1.0000 | ORAL_TABLET | Freq: Three times a day (TID) | ORAL | 0 refills | Status: DC | PRN

## 2015-10-21 NOTE — Progress Notes (Signed)
She presents today for follow-up of her MRI report of her left foot. She states that her blood sugars are now under 300. She states that her left great toe has been hurting.  Objective: Vital signs are stable to alert 3 there is no erythema edema saline as drainage or odor. No open lesions or wounds to either foot. The abscess to the dorsal aspect of the left foot has gone on to heal uneventfully. The MRI does not demonstrate any type of collection of fluid or abscess to the dorsal aspect of the first metatarsophalangeal joint left.  Assessment: Diabetic peripheral neuropathy history of abscess dorsal aspect first metatarsal phalangeal joint with cellulitis.  Plan: Follow up with [place] on an as-needed basis I did dispense a prescription for 30 oxycodone today no more will be dispensed from this office.

## 2015-12-08 ENCOUNTER — Emergency Department: Payer: Medicaid Other

## 2015-12-08 ENCOUNTER — Emergency Department
Admission: EM | Admit: 2015-12-08 | Discharge: 2015-12-08 | Disposition: A | Discharge status: Home / Self Care | Payer: Medicaid Other | Attending: Emergency Medicine | Admitting: Emergency Medicine

## 2015-12-08 ENCOUNTER — Encounter: Payer: Self-pay | Admitting: Radiology

## 2015-12-08 DIAGNOSIS — E119 Type 2 diabetes mellitus without complications: Secondary | ICD-10-CM | POA: Insufficient documentation

## 2015-12-08 DIAGNOSIS — Z794 Long term (current) use of insulin: Secondary | ICD-10-CM | POA: Insufficient documentation

## 2015-12-08 DIAGNOSIS — J45909 Unspecified asthma, uncomplicated: Secondary | ICD-10-CM | POA: Insufficient documentation

## 2015-12-08 DIAGNOSIS — R1030 Lower abdominal pain, unspecified: Secondary | ICD-10-CM

## 2015-12-08 DIAGNOSIS — J449 Chronic obstructive pulmonary disease, unspecified: Secondary | ICD-10-CM | POA: Diagnosis not present

## 2015-12-08 DIAGNOSIS — Z79899 Other long term (current) drug therapy: Secondary | ICD-10-CM | POA: Insufficient documentation

## 2015-12-08 DIAGNOSIS — F17210 Nicotine dependence, cigarettes, uncomplicated: Secondary | ICD-10-CM | POA: Insufficient documentation

## 2015-12-08 DIAGNOSIS — N3090 Cystitis, unspecified without hematuria: Secondary | ICD-10-CM | POA: Insufficient documentation

## 2015-12-08 HISTORY — DX: Unspecified asthma, uncomplicated: J45.909

## 2015-12-08 HISTORY — DX: Malignant (primary) neoplasm, unspecified: C80.1

## 2015-12-08 LAB — CBC
HCT: 49.8 % — ABNORMAL HIGH (ref 35.0–47.0)
HEMOGLOBIN: 16.6 g/dL — AB (ref 12.0–16.0)
MCH: 29.8 pg (ref 26.0–34.0)
MCHC: 33.4 g/dL (ref 32.0–36.0)
MCV: 89.4 fL (ref 80.0–100.0)
PLATELETS: 255 10*3/uL (ref 150–440)
RBC: 5.57 MIL/uL — AB (ref 3.80–5.20)
RDW: 13.2 % (ref 11.5–14.5)
WBC: 11.6 10*3/uL — ABNORMAL HIGH (ref 3.6–11.0)

## 2015-12-08 LAB — BASIC METABOLIC PANEL
Anion gap: 13 (ref 5–15)
BUN: 11 mg/dL (ref 6–20)
CO2: 22 mmol/L (ref 22–32)
CREATININE: 0.76 mg/dL (ref 0.44–1.00)
Calcium: 9 mg/dL (ref 8.9–10.3)
Chloride: 92 mmol/L — ABNORMAL LOW (ref 101–111)
Glucose, Bld: 517 mg/dL (ref 65–99)
POTASSIUM: 4.2 mmol/L (ref 3.5–5.1)
SODIUM: 127 mmol/L — AB (ref 135–145)

## 2015-12-08 LAB — URINALYSIS COMPLETE WITH MICROSCOPIC (ARMC ONLY)
BILIRUBIN URINE: NEGATIVE
Glucose, UA: >500 mg/dL — AB
HGB URINE DIPSTICK: NEGATIVE
Ketones, ur: NEGATIVE mg/dL
LEUKOCYTES UA: NEGATIVE
Nitrite: POSITIVE — AB
PH: 6 (ref 5.0–8.0)
PROTEIN: NEGATIVE mg/dL
Specific Gravity, Urine: 1.031 — ABNORMAL HIGH (ref 1.005–1.030)

## 2015-12-08 LAB — PREGNANCY, URINE: PREG TEST UR: NEGATIVE

## 2015-12-08 MED ORDER — HYDROMORPHONE HCL 1 MG/ML IJ SOLN
1.0000 mg | INTRAMUSCULAR | Status: AC
  Administered 2015-12-08: 1 mg via INTRAVENOUS

## 2015-12-08 MED ORDER — LORAZEPAM 1 MG PO TABS
ORAL_TABLET | ORAL | Status: AC
  Filled 2015-12-08: qty 1

## 2015-12-08 MED ORDER — IOPAMIDOL (ISOVUE-300) INJECTION 61%
100.0000 mL | Freq: Once | INTRAVENOUS | Status: AC | PRN
  Administered 2015-12-08: 100 mL via INTRAVENOUS
  Filled 2015-12-08: qty 100

## 2015-12-08 MED ORDER — HYDROMORPHONE HCL 1 MG/ML IJ SOLN
INTRAMUSCULAR | Status: AC
  Administered 2015-12-08: 1 mg via INTRAVENOUS
  Filled 2015-12-08: qty 1

## 2015-12-08 MED ORDER — NAPROXEN 500 MG PO TABS: 500.0000 mg | ORAL_TABLET | Freq: Two times a day (BID) | ORAL | 0 refills | Status: DC

## 2015-12-08 MED ORDER — ONDANSETRON HCL 4 MG/2ML IJ SOLN
4.0000 mg | Freq: Once | INTRAMUSCULAR | Status: AC
  Administered 2015-12-08: 4 mg via INTRAVENOUS

## 2015-12-08 MED ORDER — IOPAMIDOL (ISOVUE-300) INJECTION 61%
30.0000 mL | Freq: Once | INTRAVENOUS | Status: DC | PRN
  Filled 2015-12-08: qty 30

## 2015-12-08 MED ORDER — CEFTRIAXONE SODIUM-DEXTROSE 1-3.74 GM-% IV SOLR
INTRAVENOUS | Status: AC
  Administered 2015-12-08: 1 g via INTRAVENOUS
  Filled 2015-12-08: qty 50

## 2015-12-08 MED ORDER — CEPHALEXIN 500 MG PO CAPS: 500.0000 mg | ORAL_CAPSULE | Freq: Two times a day (BID) | ORAL | 0 refills | Status: DC

## 2015-12-08 MED ORDER — PHENAZOPYRIDINE HCL 200 MG PO TABS: 200.0000 mg | ORAL_TABLET | Freq: Three times a day (TID) | ORAL | 0 refills | Status: DC | PRN

## 2015-12-08 MED ORDER — IOPAMIDOL (ISOVUE-300) INJECTION 61%
30.0000 mL | Freq: Once | INTRAVENOUS | Status: AC | PRN
  Administered 2015-12-08: 30 mL via ORAL
  Filled 2015-12-08: qty 30

## 2015-12-08 MED ORDER — DEXTROSE 5 % IV SOLN: 1.0000 g | Freq: Once | INTRAVENOUS | Status: DC

## 2015-12-08 MED ORDER — HYDROMORPHONE HCL 1 MG/ML IJ SOLN
1.0000 mg | Freq: Once | INTRAMUSCULAR | Status: AC
  Administered 2015-12-08: 1 mg via INTRAVENOUS

## 2015-12-08 MED ORDER — SODIUM CHLORIDE 0.9 % IV BOLUS (SEPSIS)
1000.0000 mL | Freq: Once | INTRAVENOUS | Status: AC
  Administered 2015-12-08: 1000 mL via INTRAVENOUS

## 2015-12-08 MED ORDER — LORAZEPAM 1 MG PO TABS: 1.0000 mg | ORAL_TABLET | Freq: Once | ORAL | Status: DC

## 2015-12-08 MED ORDER — CEFTRIAXONE SODIUM-DEXTROSE 1-3.74 GM-% IV SOLR
1.0000 g | Freq: Once | INTRAVENOUS | Status: AC
  Administered 2015-12-08: 1 g via INTRAVENOUS

## 2015-12-08 MED ORDER — ONDANSETRON HCL 4 MG/2ML IJ SOLN
INTRAMUSCULAR | Status: AC
  Administered 2015-12-08: 4 mg via INTRAVENOUS
  Filled 2015-12-08: qty 2

## 2015-12-08 MED ORDER — PHENAZOPYRIDINE HCL 200 MG PO TABS
ORAL_TABLET | ORAL | Status: AC
Start: 2015-12-08 — End: 2015-12-08
  Administered 2015-12-08: 200 mg via ORAL
  Filled 2015-12-08: qty 1

## 2015-12-08 MED ORDER — PHENAZOPYRIDINE HCL 200 MG PO TABS
200.0000 mg | ORAL_TABLET | Freq: Once | ORAL | Status: AC
  Administered 2015-12-08: 200 mg via ORAL

## 2015-12-08 MED ORDER — ONDANSETRON 4 MG PO TBDP: 4.0000 mg | ORAL_TABLET | Freq: Three times a day (TID) | ORAL | 0 refills | Status: DC | PRN

## 2015-12-08 NOTE — ED Notes (Signed)
This RN was notified by first nurse that patient was outside with her IV fluids and antibiotics, smoking. Patient escorted back to room. Patient educated on risks of going outside shortly after having pain medication. Patient verbalizes understanding.

## 2015-12-08 NOTE — ED Triage Notes (Signed)
Pt reports for the past couple of weeks sharp pains in left and right lower back that has gotten worse over the past couple of weeks. Pt urinary frequency and odor. Denies Hx of kidney stones. Pt reports sweats and chills. Denies vaginal discharge.

## 2015-12-08 NOTE — ED Provider Notes (Signed)
[facility] Emergency Department Provider Note  ____________________________________________  Time seen: Approximately 6:30 PM  I have reviewed the triage vital signs and the nursing notes.   HISTORY  Chief Complaint Back Pain and Urinary Frequency    HPI Dayanna Inghram is a 45 y.o. female who complains of worsening bilateral lower abdominal and lower back pain for the past week. Also has dysuria or frequency and foul smelling urine over this time. Also reports chills and sweats but no fever. No nausea or vomiting. Eating and drinking normally. Pain is worse with movement. No alleviating factors. Moderate in intensity.     Past Medical History:  Diagnosis Date  . Asthma   . Cancer ([place])   . Cellulitis 04/15/2015  . COPD (chronic obstructive pulmonary disease) ([place])   . Diabetes mellitus    Poorly controlled with complications  . Emphysema   . Fibromyalgia   . Neuropathy ([place])   . Tenosynovitis of foot 04/16/2015     Patient Active Problem List   Diagnosis Date Noted  . 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 Tenosynovitis of foot 04/16/2015  . Hypokalemia 04/16/2015  . Cellulitis 04/15/2015  . Diabetes mellitus ([place]) 04/15/2015  . Pain syndrome, chronic 04/15/2015  . COPD (chronic obstructive pulmonary disease) ([place]) 04/15/2015  . Diabetic neuropathy ([place]) 04/15/2015  . Diabetes mellitus with complication ([place])   . COPD exacerbation ([place]) 10/19/2014     Past Surgical History:  Procedure Laterality Date  . BACK SURGERY    . CESAREAN SECTION    . TONSILLECTOMY    . TUBAL LIGATION       Prior to Admission medications   Medication Sig Start Date End Date Taking? Authorizing Provider  acetaminophen (TYLENOL) 325 MG tablet Take 650 mg by mouth every 6 (six) hours as needed for mild pain.    Historical Provider, MD  albuterol (PROVENTIL HFA;VENTOLIN HFA) 108 (90 BASE) MCG/ACT inhaler Inhale 2 puffs into the  lungs every 4 (four) hours as needed for wheezing or shortness of breath. For wheeze or shortness of breath 10/23/14   Nicholes Mango, MD  albuterol (PROVENTIL) (2.5 MG/3ML) 0.083% nebulizer solution Take 2.5 mg by nebulization every 4 (four) hours as needed for wheezing or shortness of breath.     Historical Provider, MD  cephALEXin (KEFLEX) 500 MG capsule Take 1 capsule (500 mg total) by mouth 2 (two) times daily. 12/08/15   Carrie Mew, MD  fluticasone-salmeterol (ADVAIR HFA) [phone] MCG/ACT inhaler Inhale 2 puffs into the lungs 2 (two) times daily. 10/23/14   Nicholes Mango, MD  gabapentin (NEURONTIN) 400 MG capsule Take 2 capsules (800 mg total) by mouth 3 (three) times daily. Patient taking differently: Take 1,200 mg by mouth 3 (three) times daily.  10/23/14   Nicholes Mango, MD  gabapentin (NEURONTIN) 800 MG tablet Take two capsules by mouth in the morning. Take one capsule by mouth at lunch.  Take two capsules by mouth at bedtime. 07/29/15   Max T Hyatt, DPM  guaiFENesin-dextromethorphan (ROBITUSSIN DM) 100-10 MG/5ML syrup Take 10 mLs by mouth every 6 (six) hours as needed for cough. Patient not taking: Reported on 07/28/2015 10/23/14   Nicholes Mango, MD  ibuprofen (ADVIL,MOTRIN) 600 MG tablet Take 600 mg by mouth every 6 (six) hours as needed for fever or mild pain.    Historical Provider, MD  Influenza vac split quadrivalent PF (FLUARIX) 0.5 ML injection Inject 0.5 mLs into the muscle tomorrow at 10 am. Patient not taking:  Reported on 04/15/2015 10/23/14   Nicholes Mango, MD  insulin aspart (NOVOLOG) 100 UNIT/ML injection Inject 10 Units into the skin 3 (three) times daily with meals. 10/23/14   Nicholes Mango, MD  insulin glargine (LANTUS) 100 UNIT/ML injection Inject 0.25 mLs (25 Units total) into the skin 2 (two) times daily. Patient taking differently: Inject 28 Units into the skin 2 (two) times daily.  10/23/14   Nicholes Mango, MD  lidocaine (LIDODERM) 5 % Place 1 patch onto the skin daily as needed. For pain.  Remove & Discard patch within 12 hours or as directed by MD    Historical Provider, MD  metFORMIN (GLUCOPHAGE) 500 MG tablet Take 1 tablet (500 mg total) by mouth 2 (two) times daily with a meal. Patient taking differently: Take 500 mg by mouth daily.  10/23/14   Nicholes Mango, MD  naproxen (NAPROSYN) 500 MG tablet Take 1 tablet (500 mg total) by mouth 2 (two) times daily with a meal. 12/08/15   Carrie Mew, MD  nicotine (NICODERM CQ - DOSED IN MG/24 HOURS) 21 mg/24hr patch Place 1 patch (21 mg total) onto the skin daily. Patient not taking: Reported on 07/28/2015 10/23/14   Nicholes Mango, MD  ondansetron (ZOFRAN ODT) 4 MG disintegrating tablet Take 1 tablet (4 mg total) by mouth every 8 (eight) hours as needed for nausea or vomiting. 12/08/15   Carrie Mew, MD  oxyCODONE-acetaminophen (PERCOCET) 10-325 MG tablet Take 1 tablet by mouth every 8 (eight) hours as needed for pain. 10/21/15   Max T Hyatt, DPM  phenazopyridine (PYRIDIUM) 200 MG tablet Take 1 tablet (200 mg total) by mouth 3 (three) times daily as needed for pain. 12/08/15   Carrie Mew, MD  pneumococcal 23 valent vaccine (PNU-IMMUNE) 25 MCG/0.5ML injection Inject 0.5 mLs into the muscle tomorrow at 10 am. Patient not taking: Reported on 04/15/2015 10/23/14   Nicholes Mango, MD  tiotropium (SPIRIVA) 18 MCG inhalation capsule Place 1 capsule (18 mcg total) into inhaler and inhale daily. 10/23/14   Nicholes Mango, MD  traMADol (ULTRAM) 50 MG tablet Take 1 tablet (50 mg total) by mouth every 6 (six) hours as needed. 07/29/15   Max T Hyatt, DPM     Allergies Hydrocodone; Darvocet [propoxyphene n-acetaminophen]; Fish-derived products; Flexeril [cyclobenzaprine]; Propofol; Toradol [ketorolac tromethamine]; and Vicodin [hydrocodone-acetaminophen]   Family History  Problem Relation Age of Onset  . Lung cancer Mother     Social History Social History  Substance Use Topics  . Smoking status: Current Some Day Smoker    Packs/day: 0.50     Years: 30.00    Types: Cigarettes    Last attempt to quit: 10/05/2014  . Smokeless tobacco: Not on file  . Alcohol use No    Review of Systems  Constitutional:   No fever Positive chills.   Cardiovascular:   No chest pain. Respiratory:   No dyspnea or cough. Gastrointestinal:   Positive lower abdominal pain. No vomiting or diarrhea.  Genitourinary:   Positive dysuria and urinary frequency.  10-point ROS otherwise negative.  ____________________________________________   PHYSICAL EXAM:  VITAL SIGNS: ED Triage Vitals  Enc Vitals Group     BP 12/08/15 1635 (!) 155/111     Pulse Rate 12/08/15 1635 93     Resp 12/08/15 1635 20     Temp 12/08/15 1635 97.9 F (36.6 C)     Temp Source 12/08/15 1635 Oral     SpO2 12/08/15 1635 95 %     Weight 12/08/15 1635 165 lb (  74.8 kg)     Height 12/08/15 1635 5\' 2"  (1.575 m)     Head Circumference --      Peak Flow --      Pain Score 12/08/15 1701 9     Pain Loc --      Pain Edu? --      Excl. in [place]? --     Vital signs reviewed, nursing assessments reviewed.   Constitutional:   Alert and oriented. Well appearing and in no distress.Anxious appearing Eyes:   No scleral icterus. No conjunctival pallor. PERRL. EOMI.  No nystagmus. ENT   Head:   Normocephalic and atraumatic.   Nose:   No congestion/rhinnorhea. No septal hematoma   Mouth/Throat:   MMM, no pharyngeal erythema. No peritonsillar mass.    Neck:   No stridor. No SubQ emphysema. No meningismus. Hematological/Lymphatic/Immunilogical:   No cervical lymphadenopathy. Cardiovascular:   RRR. Symmetric bilateral radial and DP pulses.  No murmurs.  Respiratory:   Normal respiratory effort without tachypnea nor retractions. Breath sounds are clear and equal bilaterally. No wheezes/rales/rhonchi. Gastrointestinal:   Soft with suprapubic and right lower quadrant tenderness. Non distended. There is no CVA tenderness.  No rebound, rigidity, or guarding. Genitourinary:    deferred Musculoskeletal:   Nontender with normal range of motion in all extremities. No joint effusions.  No lower extremity tenderness.  No edema. Neurologic:   Normal speech and language.  CN 2-10 normal. Motor grossly intact. No gross focal neurologic deficits are appreciated.  Skin:    Skin is warm, dry and intact. No rash noted.  No petechiae, purpura, or bullae.  ____________________________________________    LABS (pertinent positives/negatives) (all labs ordered are listed, but only abnormal results are displayed) Labs Reviewed  URINALYSIS COMPLETEWITH MICROSCOPIC ([facility] ONLY) - Abnormal; Notable for the following:       Result Value   Color, Urine STRAW (*)    APPearance CLEAR (*)    Glucose, UA >500 (*)    Specific Gravity, Urine 1.031 (*)    Nitrite POSITIVE (*)    Bacteria, UA RARE (*)    Squamous Epithelial / LPF 0-5 (*)    All other components within normal limits  CBC - Abnormal; Notable for the following:    WBC 11.6 (*)    RBC 5.57 (*)    Hemoglobin 16.6 (*)    HCT 49.8 (*)    All other components within normal limits  BASIC METABOLIC PANEL - Abnormal; Notable for the following:    Sodium 127 (*)    Chloride 92 (*)    Glucose, Bld 517 (*)    All other components within normal limits  URINE CULTURE  URINE CULTURE  PREGNANCY, URINE   ____________________________________________   EKG    ____________________________________________    RADIOLOGY  CT abdomen and pelvis unremarkable. Question of possible focal area of pyelonephritis in the right kidney  ____________________________________________   PROCEDURES Procedures  ____________________________________________   INITIAL IMPRESSION / ASSESSMENT AND PLAN / ED COURSE  Pertinent labs & imaging results that were available during my care of the patient were reviewed by me and considered in my medical decision making (see chart for details).  Patient sat in distress but clearly  uncomfortable. Workup is unremarkable except for nitrite positive UTI. With right lower quadrant tenderness as well, we'll get a CT scan of the abdomen pelvis to evaluate for appendicitis.     Clinical Course  Comment By Time  CT essentially unremarkable. Pt still c/o pain, but  appears very anxious. Will give ativan. Pt was seen walking outside with IVF running to smoke.  Carrie Mew, MD 10/29 2017    ----------------------------------------- 8:43 PM on 12/08/2015 -----------------------------------------  After offering the patient Ativan for her continued pain complaints despite 2 mg of IV Dilaudid, patient refused and demanded more IV pain medicine. Review of the controlled substance reporting system reveals that the patient has obtained multiple opioid prescriptions in the past 6-12 months from multiple different providers, sometimes within a day of each other. I had a long talk with the patient about her results and treating her urinary tract infection. Clinically I don't think she has pyelonephritis. Her white blood cell count is unimpressive. She does not have a fever. Vital signs are unremarkable. Her sodium levels low but low but when correcting for her hyperglycemia, this is actually within normal limits. She is not in distress, tolerating oral intake. Appears suitable for outpatient follow-up at this time. She indicates that she's been assigned to [facility] provider which should provide excellent follow-up. Prescribed her naproxen Pyridium Keflex and Zofran for treatment of her UTI and cystitis.  Considering the patient's symptoms, medical history, and physical examination today, I have low suspicion for cholecystitis or biliary pathology, pancreatitis, perforation or bowel obstruction, hernia, intra-abdominal abscess, AAA or dissection, volvulus or intussusception, mesenteric ischemia, or appendicitis. The patient is not septic or  acidotic   ____________________________________________   FINAL CLINICAL IMPRESSION(S) / ED DIAGNOSES  Final diagnoses:  Cystitis  Lower abdominal pain       Portions of this note were generated with dragon dictation software. Dictation errors may occur despite best attempts at proofreading.    Carrie Mew, MD 12/08/15 2045

## 2015-12-08 NOTE — ED Notes (Signed)
This nurse explained physicians order of Ativan as a way to help with the anxiety induced pain. Pt yelled at nurse and family member, " Am I belligerent?" " I didn't come here for anxiety , I came here for pain meds", " I need pain meds now!" MD aware of this conversation; no further orders indicated.

## 2015-12-11 LAB — URINE CULTURE: Culture: >=100000 — AB

## 2015-12-18 ENCOUNTER — Telehealth: Payer: Self-pay | Admitting: *Deleted

## 2015-12-18 NOTE — Telephone Encounter (Signed)
She gets med from pain management doc.  He told me to only do a short dose.

## 2015-12-18 NOTE — Telephone Encounter (Signed)
Pt states she needs an appt and a refill of her pain medication. I spoke with pt and told her I had reviewed her LOV 10/21/2015 and that I would transfer her to get an appt as soon as possible, but at her last visit with Dr. Milinda Pointer he had stated he would not write for pain medications again. Pt began to cry, she said her toes were turning black, and her toenail was standing straight up. I told pt to touch her toes and see if they were warm and she stated they were, and the tips of her toes were turning black. I told her she should go to the ER, because she may be having problems with her circulation. I told her I would inform Dr. Milinda Pointer.

## 2015-12-18 NOTE — Telephone Encounter (Signed)
Entered in error

## 2015-12-26 ENCOUNTER — Other Ambulatory Visit: Payer: Self-pay | Admitting: Podiatry

## 2015-12-26 ENCOUNTER — Ambulatory Visit (INDEPENDENT_AMBULATORY_CARE_PROVIDER_SITE_OTHER): Payer: Medicaid Other

## 2015-12-26 ENCOUNTER — Ambulatory Visit (INDEPENDENT_AMBULATORY_CARE_PROVIDER_SITE_OTHER): Payer: Medicaid Other | Admitting: Podiatry

## 2015-12-26 VITALS — BP 128/97 | HR 56 | Temp 97.6°F | Resp 16

## 2015-12-26 DIAGNOSIS — L97501 Non-pressure chronic ulcer of other part of unspecified foot limited to breakdown of skin: Secondary | ICD-10-CM

## 2015-12-26 DIAGNOSIS — L97521 Non-pressure chronic ulcer of other part of left foot limited to breakdown of skin: Secondary | ICD-10-CM | POA: Diagnosis not present

## 2015-12-26 DIAGNOSIS — L02612 Cutaneous abscess of left foot: Secondary | ICD-10-CM

## 2015-12-26 DIAGNOSIS — R52 Pain, unspecified: Secondary | ICD-10-CM

## 2015-12-26 DIAGNOSIS — L03032 Cellulitis of left toe: Secondary | ICD-10-CM | POA: Diagnosis not present

## 2015-12-26 MED ORDER — CLINDAMYCIN HCL 300 MG PO CAPS: 300.0000 mg | ORAL_CAPSULE | Freq: Three times a day (TID) | ORAL | 2 refills | Status: DC

## 2015-12-26 MED ORDER — OXYCODONE-ACETAMINOPHEN 5-325 MG PO TABS: 1.0000 | ORAL_TABLET | Freq: Three times a day (TID) | ORAL | 0 refills | Status: DC | PRN

## 2015-12-26 MED ORDER — CIPROFLOXACIN HCL 500 MG PO TABS: 500.0000 mg | ORAL_TABLET | Freq: Two times a day (BID) | ORAL | 0 refills | Status: DC

## 2015-12-26 MED ORDER — SILVER SULFADIAZINE 1 % EX CREA: 1.0000 "application " | TOPICAL_CREAM | Freq: Every day | CUTANEOUS | 0 refills | Status: DC

## 2015-12-26 NOTE — Progress Notes (Signed)
   Subjective:    Patient ID: Katie Guzman, female    DOB: 02/26/1970, 45 y.o.   MRN: [id number]  HPI   45 year old female presents the office today for concerns of left big toe infection which is been ongoing the last 4 days. She states that 2 days ago she had a fever 102 but this is subsided. She states that she gets a pedicure several weeks ago and since then she has noticed discoloration to her toes. She also developed a blister on the side of her big toe and she started getting redness. She states the areas tender to touch. Denies any red streaks. She is calling not taking antibiotics. She's had no other treatment for this. No other complaints at this time.  She states that her blood sugars in the 500s and they have been for some time she did not have a primary care doctor.  Review of Systems  All other systems reviewed and are negative.      Objective:   Physical Exam General: AAO x3, NAD  Dermatological: There is a flaccid bulla present on the medial aspect the left hallux. Upon debridement there is a superficial granular wound. Small amount of serous and once changes expressed there is no pus. There is erythema to the hallux to the level of the MPJ but there is no ascending cellulitis. There is tenderness gently upon the blister. The distal aspect of the second third digit appears to be area of ecchymosis. There is no carpal oblique discoloration other than the bruising to the very tips the toes. There is no edema, erythema. There is no ingrown toenails or drainage on the toenails. There is no other open lesions or pre-ulcerative lesions.  Vascular: Dorsalis Pedis artery and Posterior Tibial artery pedal pulses are 2/4 bilateral with immedate capillary fill time.  There is no pain with calf compression, swelling, warmth, erythema.   Neruologic: Sensation decreased with Derrel Nip monofilament.  Musculoskeletal: No pain, crepitus, or limitation noted with foot and ankle  range of motion bilateral. Muscular strength 5/5 in all groups tested bilateral.     Assessment & Plan:  45 year old female left hallux cellulitis, ulceration -Treatment options discussed including all alternatives, risks, and complications -Etiology of symptoms were discussed -X-rays were obtained and reviewed with the patient. No definitive evidence of acute osteomyelitis or soft tissue emphysema. No evidence of acute fracture. -Bulla was deroofed today. Silvadene was applied. This was ordered as well as daily dressing changes. -Order clindamycin and ciprofloxacin for infection. -I did culture the drainage today. -Surgical shoe prescription provided. -Monitor for signs or symptoms of worsening infection to the ER should any occur or call the office. Follow-up as scheduled or sooner if needed.  Celesta Gentile, DPM

## 2015-12-30 ENCOUNTER — Telehealth: Payer: Self-pay | Admitting: Podiatry

## 2015-12-30 LAB — WOUND CULTURE: Organism ID, Bacteria: NONE SEEN

## 2015-12-30 NOTE — Telephone Encounter (Signed)
Patient called said that she doesn't have an appt until 11/28 with dr. Amalia Hailey and needs a refill on her Oxycodine 5.325 mlg tab before then. She is out and having a lot of pain. Would like to pick this up in [place] today. Please call her on [phone] to let her know when to pick up.

## 2015-12-30 NOTE — Telephone Encounter (Signed)
She can have one more refill. I am not in the office until next week but Dr. Amalia Hailey can write for it tomorrow

## 2015-12-30 NOTE — Telephone Encounter (Signed)
Sounds good. I'll write it tomorrow so she can pick it up.

## 2015-12-31 ENCOUNTER — Other Ambulatory Visit: Payer: Self-pay | Admitting: *Deleted

## 2015-12-31 DIAGNOSIS — M79673 Pain in unspecified foot: Secondary | ICD-10-CM

## 2015-12-31 MED ORDER — OXYCODONE-ACETAMINOPHEN 10-325 MG PO TABS: 1.0000 | ORAL_TABLET | Freq: Three times a day (TID) | ORAL | 0 refills | Status: DC | PRN

## 2016-01-01 MED ORDER — CIPROFLOXACIN HCL 500 MG PO TABS: 500.0000 mg | ORAL_TABLET | Freq: Two times a day (BID) | ORAL | 1 refills | Status: DC

## 2016-01-01 NOTE — Telephone Encounter (Addendum)
-----   Message from Trula Slade, DPM sent at 12/31/2015  7:53 PM EST ----- Continue clinda/cipro. 01/01/2016-I informed pt of Dr. Leigh Aurora orders and refill Cipro with +1refill. 01/13/2016-Received refill request for Silvadene. Dr. Trinidad Curet once and pt is to keep 01/21/2016 appt.

## 2016-01-07 ENCOUNTER — Encounter: Payer: Self-pay | Admitting: Podiatry

## 2016-01-07 ENCOUNTER — Ambulatory Visit (INDEPENDENT_AMBULATORY_CARE_PROVIDER_SITE_OTHER): Payer: Medicaid Other | Admitting: Podiatry

## 2016-01-07 VITALS — BP 118/79 | HR 106 | Resp 16

## 2016-01-07 DIAGNOSIS — E08621 Diabetes mellitus due to underlying condition with foot ulcer: Secondary | ICD-10-CM

## 2016-01-07 DIAGNOSIS — E1143 Type 2 diabetes mellitus with diabetic autonomic (poly)neuropathy: Secondary | ICD-10-CM

## 2016-01-07 DIAGNOSIS — E0843 Diabetes mellitus due to underlying condition with diabetic autonomic (poly)neuropathy: Secondary | ICD-10-CM | POA: Diagnosis not present

## 2016-01-07 DIAGNOSIS — L97509 Non-pressure chronic ulcer of other part of unspecified foot with unspecified severity: Secondary | ICD-10-CM

## 2016-01-07 DIAGNOSIS — I70245 Atherosclerosis of native arteries of left leg with ulceration of other part of foot: Secondary | ICD-10-CM

## 2016-01-07 MED ORDER — OXYCODONE-ACETAMINOPHEN 5-325 MG PO TABS: 1.0000 | ORAL_TABLET | Freq: Three times a day (TID) | ORAL | 0 refills | Status: DC | PRN

## 2016-01-08 NOTE — Progress Notes (Signed)
Subjective:  Patient with a history of diabetes mellitus presents today for evaluation ulceration(s) to the lower extremitie(s). Patient states that he did have a history of diabetes mellitus. Patient has been seen in the past by Dr. Jacqualyn Posey for management of left foot diabetic ulcers. Patient also completed a regimen of antibiotics prescribed by Dr. Jacqualyn Posey.    Objective/Physical Exam General: The patient is alert and oriented x3 in no acute distress.  Dermatology:  Wound #1 noted to the left great toe laterally measuring [phone] cm (LxWxD). There is an overlying bullous blisteration with serous drainage. Upon deroofing of the blistering and overlying skin the following findings are provided.  To the noted ulceration(s), there is no eschar. There is a moderate amount of slough, fibrin, and necrotic tissue noted. Granulation tissue and wound base is red. There is a minimal amount of serosanguineous drainage noted. There is no exposed bone muscle-tendon ligament or joint. There is no malodor. Periwound integrity is intact. Skin is warm, dry and supple bilateral lower extremities.  Vascular: Palpable pedal pulses bilaterally. No edema or erythema noted. Capillary refill within normal limits.  Neurological: Epicritic and protective threshold absent bilaterally.   Musculoskeletal Exam: Range of motion within normal limits to all pedal and ankle joints bilateral. Muscle strength 5/5 in all groups bilateral.   Assessment: #1 ulceration left great toe secondary to diabetes mellitus #2 diabetes mellitus w/ peripheral neuropathy   Plan of Care:  #1 Patient was evaluated. #2 medically necessary excisional debridement including subcutaneous tissue was performed using a tissue nipper and a chisel blade. Excisional debridement of all the necrotic nonviable tissue down to healthy bleeding viable tissue was performed with post-debridement measurements same as pre-. #3 the wound was cleansed and dry  sterile dressing applied. #4 prescription for Percocet dispensed #5 continue daily wound care dressings at home. #6 discussed the importance of maintaining blood sugar #7 return to clinic in 4 weeks for reevaluation    Dr. Edrick Kins, [place]

## 2016-01-13 MED ORDER — SILVER SULFADIAZINE 1 % EX CREA: 1.0000 "application " | TOPICAL_CREAM | Freq: Every day | CUTANEOUS | 0 refills | Status: DC

## 2016-01-15 ENCOUNTER — Telehealth: Payer: Self-pay | Admitting: *Deleted

## 2016-01-15 NOTE — Telephone Encounter (Addendum)
Pt called for oxycodone. I reviewed LOV with Dr. Milinda Pointer and he states he will not prescribe any more pain medication for pt after 10/21/2015 visit, and 12/18/2015 correspondence Dr. Milinda Pointer states pt gets pain medication from pain management doctor and that doctor told him to only give her a short course. I informed pt and she denies having pain management doctor, and states the problem with her toe is going all the way around the toe now. Pt states she now sees Dr. Amalia Hailey I told her I would ask Dr. Amalia Hailey for pain medication and she needed to get an appt earlier than 01/21/2016 and transferred to schedulers. I spoke with Dr. Amalia Hailey and any pain medication that was ordered would be one to two days early. I checked to see if pt had scheduled to be seen earlier, and schedulers states pt did not want to come to [facility] so didn't schedule. I told Dr. Amalia Hailey pt stated her toe was getting worse, he said get her in. I called pt again and told her we would be able to get her into see Dr. Amalia Hailey on Friday in [place], and she could discuss the pain medication and the treatment of her toes. Pt states she doesn't know why we're trying to make her feel bad about wanting pain medication, because she needs it because her toes are really bad. I told her we could not keep giving pain medication when she was to see pain management even if she wasn't established in a program, we needed to make sure she had the problem causing the pain taken care of. Left message informing pt that her appt is for 01/16/2016 at 2:45pm. I asked D. [place] to contact pt and leave the correct appt and date time also. 12/082017-Received request for new rx, with instructions for Gabapentin 400mg  #300, sig: take 4 capsules every morning, 2 capsules at lunch and 4 capsules at bedtime. I reviewed pt's clinicals and 01/07/2016 Dr. Amalia Hailey had discontinued the Gabapentin for a change, but no change of dosing or orders were available for review. I will sent  note to Dr. Amalia Hailey requesting instructions. 03/02/2015-Pt states she needs note for Dr. Amalia Hailey for school stating she has been out 01/07/2016 through 01/24/2016 and will return 01/27/2016. 01/22/2016-I spoke with pt and stated she could pick up the note in [place]. Pt states she knows it will be early but needs to get refill of oxycodone. I told pt I would inform Dr. Amalia Hailey and if he refilled she could pick up in the [facility] office 01/24/2016 with the note. Pt states understanding. 05/26/2016-Pt states she wants her refill of Oxycodone 5/325.05/28/2016-Pt states she is in severe pain and called 1 1/2 day ago and has not gotten an answer. Pt states she has an appt 06/09/2016 and wants refill of her oxycodone.06/12/2016-Pt called states she is in a lot of pain. 06/15/2016-Left message informing pt that Dr. Amalia Hailey would refill the percocet in the [facility] office tomorrow and that [place] would call once we had received medical clearance for her surgery.07/15/2016-Pt states she is in excruciating pain, with swelling and spots on her toe and she would like to get a refill of the oxycodone 5/325.

## 2016-01-16 ENCOUNTER — Ambulatory Visit (INDEPENDENT_AMBULATORY_CARE_PROVIDER_SITE_OTHER): Payer: Medicaid Other | Admitting: Podiatry

## 2016-01-16 ENCOUNTER — Encounter: Payer: Self-pay | Admitting: Podiatry

## 2016-01-16 ENCOUNTER — Ambulatory Visit (INDEPENDENT_AMBULATORY_CARE_PROVIDER_SITE_OTHER): Payer: Medicaid Other

## 2016-01-16 DIAGNOSIS — E0843 Diabetes mellitus due to underlying condition with diabetic autonomic (poly)neuropathy: Secondary | ICD-10-CM

## 2016-01-16 DIAGNOSIS — I70245 Atherosclerosis of native arteries of left leg with ulceration of other part of foot: Secondary | ICD-10-CM | POA: Diagnosis not present

## 2016-01-16 DIAGNOSIS — E1143 Type 2 diabetes mellitus with diabetic autonomic (poly)neuropathy: Secondary | ICD-10-CM

## 2016-01-16 DIAGNOSIS — E08621 Diabetes mellitus due to underlying condition with foot ulcer: Secondary | ICD-10-CM | POA: Diagnosis not present

## 2016-01-16 DIAGNOSIS — L97523 Non-pressure chronic ulcer of other part of left foot with necrosis of muscle: Secondary | ICD-10-CM | POA: Diagnosis not present

## 2016-01-16 DIAGNOSIS — L97509 Non-pressure chronic ulcer of other part of unspecified foot with unspecified severity: Secondary | ICD-10-CM

## 2016-01-16 DIAGNOSIS — L97521 Non-pressure chronic ulcer of other part of left foot limited to breakdown of skin: Secondary | ICD-10-CM

## 2016-01-16 MED ORDER — OXYCODONE-ACETAMINOPHEN 5-325 MG PO TABS: 1.0000 | ORAL_TABLET | Freq: Three times a day (TID) | ORAL | 0 refills | Status: DC | PRN

## 2016-01-16 MED ORDER — AMOXICILLIN-POT CLAVULANATE 875-125 MG PO TABS: 1.0000 | ORAL_TABLET | Freq: Two times a day (BID) | ORAL | 0 refills | Status: DC

## 2016-01-17 NOTE — Telephone Encounter (Signed)
I don't see a note on today (01/17/16). Do you mean yesterday?

## 2016-01-19 NOTE — Progress Notes (Signed)
Subjective:  Patient with a history of diabetes mellitus presents today for evaluation ulceration(s) to the lower extremitie(s). Patient states that he did have a history of diabetes mellitus. Patient has been seen in the past by Dr. Jacqualyn Posey for management of left foot diabetic ulcers. Patient also completed a regimen of antibiotics prescribed by Dr. Jacqualyn Posey.    Objective/Physical Exam General: The patient is alert and oriented x3 in no acute distress.  Dermatology:  Wound #1 noted to the left great toe laterally measuring [phone] cm (LxWxD).   To the noted ulceration(s), there is no eschar. There is an extensive amount of slough, fibrin, and necrotic tissue noted. Granulation tissue is red. Wound base is 90% fibrotic and gray. There is a moderate amount of serosanguineous drainage noted. There is no exposed bone tendon ligament or joint. There is exposed deep fascial and muscle layers. There is no malodor. Periwound integrity is intact with mild cellulitis. Skin is warm, dry and supple bilateral lower extremities.  Vascular: Palpable pedal pulses bilaterally. No edema or erythema noted. Capillary refill within normal limits.  Neurological: Epicritic and protective threshold absent bilaterally.   Musculoskeletal Exam: Range of motion within normal limits to all pedal and ankle joints bilateral. Muscle strength 5/5 in all groups bilateral.   Assessment: #1 ulceration left great toe secondary to diabetes mellitus #2 diabetes mellitus w/ peripheral neuropathy   Plan of Care:  #1 Patient was evaluated. Culture results from 12/26/2015 were reviewed. #2 medically necessary excisional debridement including muscle and deep fascial tissue was performed using a tissue nipper and a chisel blade. Excisional debridement of all the necrotic nonviable tissue down to healthy bleeding viable tissue was performed with post-debridement measurements same as pre-. #3 the wound was cleansed and dry sterile  dressing applied. #4 prescription for Percocet dispensed #5 prescription for Augmentin based on wound culture from 12/26/2015. #6 return to clinic in 1 week #7 instructed the patient to go directly to the hospital condition worsens   Dr. Edrick Kins, [place]

## 2016-01-21 ENCOUNTER — Ambulatory Visit (INDEPENDENT_AMBULATORY_CARE_PROVIDER_SITE_OTHER): Payer: Medicaid Other | Admitting: Podiatry

## 2016-01-21 VITALS — Temp 97.9°F

## 2016-01-21 DIAGNOSIS — I70245 Atherosclerosis of native arteries of left leg with ulceration of other part of foot: Secondary | ICD-10-CM | POA: Diagnosis not present

## 2016-01-21 DIAGNOSIS — E1143 Type 2 diabetes mellitus with diabetic autonomic (poly)neuropathy: Secondary | ICD-10-CM

## 2016-01-21 DIAGNOSIS — L97522 Non-pressure chronic ulcer of other part of left foot with fat layer exposed: Secondary | ICD-10-CM

## 2016-01-21 NOTE — Progress Notes (Signed)
Subjective:  Patient with a history of diabetes mellitus presents today for evaluation ulceration(s) to the lower extremitie(s). Patient states that he did have a history of diabetes mellitus. Patient has been seen in the past by Dr. Jacqualyn Posey for management of left foot diabetic ulcers. Patient also completed a regimen of antibiotics prescribed by Dr. Jacqualyn Posey.    Objective/Physical Exam General: The patient is alert and oriented x3 in no acute distress.  Dermatology:  Wound #1 noted to the left great toe laterally measuring [phone] cm (LxWxD).   To the noted ulceration(s), there is no eschar. There is an extensive amount of slough, fibrin, and necrotic tissue noted. Granulation tissue is red. Wound base is 90% fibrotic and gray. There is a moderate amount of serosanguineous drainage noted. There is no exposed bone tendon ligament or joint. There is exposed deep fascial and muscle layers. There is no malodor. Periwound integrity is intact with mild cellulitis. Skin is warm, dry and supple bilateral lower extremities.  Vascular: Palpable pedal pulses bilaterally. No edema or erythema noted. Capillary refill within normal limits.  Neurological: Epicritic and protective threshold absent bilaterally.   Musculoskeletal Exam: Range of motion within normal limits to all pedal and ankle joints bilateral. Muscle strength 5/5 in all groups bilateral.   Assessment: #1 ulceration left great toe secondary to diabetes mellitus #2 diabetes mellitus w/ peripheral neuropathy   Plan of Care:  #1 Patient was evaluated.  #2 medically necessary excisional debridement including subcutaneous tissue was performed using a tissue nipper and a chisel blade. Excisional debridement of all the necrotic nonviable tissue down to healthy bleeding viable tissue was performed with post-debridement measurements same as pre-. #3 the wound was cleansed and dry sterile dressing applied. #4 continue Augmentin oral antibiotic   #5 return to clinic in 1 week #6 instructed the patient to go directly to the hospital condition worsens. Instructed patient to apply iodine daily   Dr. Edrick Kins, [place]

## 2016-01-22 ENCOUNTER — Encounter: Payer: Self-pay | Admitting: *Deleted

## 2016-01-28 ENCOUNTER — Encounter: Payer: Self-pay | Admitting: Podiatry

## 2016-01-28 ENCOUNTER — Ambulatory Visit (INDEPENDENT_AMBULATORY_CARE_PROVIDER_SITE_OTHER): Payer: Medicaid Other | Admitting: Podiatry

## 2016-01-28 VITALS — Temp 97.7°F

## 2016-01-28 DIAGNOSIS — L97523 Non-pressure chronic ulcer of other part of left foot with necrosis of muscle: Secondary | ICD-10-CM

## 2016-01-28 DIAGNOSIS — E1143 Type 2 diabetes mellitus with diabetic autonomic (poly)neuropathy: Secondary | ICD-10-CM

## 2016-01-28 DIAGNOSIS — L97509 Non-pressure chronic ulcer of other part of unspecified foot with unspecified severity: Secondary | ICD-10-CM | POA: Diagnosis not present

## 2016-01-28 DIAGNOSIS — E0843 Diabetes mellitus due to underlying condition with diabetic autonomic (poly)neuropathy: Secondary | ICD-10-CM

## 2016-01-28 DIAGNOSIS — E08621 Diabetes mellitus due to underlying condition with foot ulcer: Secondary | ICD-10-CM

## 2016-01-28 DIAGNOSIS — I70245 Atherosclerosis of native arteries of left leg with ulceration of other part of foot: Secondary | ICD-10-CM

## 2016-01-28 MED ORDER — AMOXICILLIN-POT CLAVULANATE 875-125 MG PO TABS: 1.0000 | ORAL_TABLET | Freq: Two times a day (BID) | ORAL | 0 refills | Status: DC

## 2016-01-28 MED ORDER — OXYCODONE-ACETAMINOPHEN 5-325 MG PO TABS: 1.0000 | ORAL_TABLET | Freq: Three times a day (TID) | ORAL | 0 refills | Status: DC | PRN

## 2016-02-07 ENCOUNTER — Ambulatory Visit (INDEPENDENT_AMBULATORY_CARE_PROVIDER_SITE_OTHER): Payer: Medicaid Other | Admitting: Podiatry

## 2016-02-07 ENCOUNTER — Encounter: Payer: Self-pay | Admitting: Podiatry

## 2016-02-07 DIAGNOSIS — L97522 Non-pressure chronic ulcer of other part of left foot with fat layer exposed: Secondary | ICD-10-CM

## 2016-02-07 DIAGNOSIS — I70245 Atherosclerosis of native arteries of left leg with ulceration of other part of foot: Secondary | ICD-10-CM | POA: Diagnosis not present

## 2016-02-07 DIAGNOSIS — E0843 Diabetes mellitus due to underlying condition with diabetic autonomic (poly)neuropathy: Secondary | ICD-10-CM

## 2016-02-07 DIAGNOSIS — E1143 Type 2 diabetes mellitus with diabetic autonomic (poly)neuropathy: Secondary | ICD-10-CM

## 2016-02-07 DIAGNOSIS — L97509 Non-pressure chronic ulcer of other part of unspecified foot with unspecified severity: Secondary | ICD-10-CM

## 2016-02-07 DIAGNOSIS — E08621 Diabetes mellitus due to underlying condition with foot ulcer: Secondary | ICD-10-CM

## 2016-02-07 MED ORDER — OXYCODONE-ACETAMINOPHEN 5-325 MG PO TABS: 1.0000 | ORAL_TABLET | Freq: Three times a day (TID) | ORAL | 0 refills | Status: DC | PRN

## 2016-02-07 NOTE — Progress Notes (Signed)
Subjective:  Patient with a history of diabetes mellitus presents today for evaluation ulceration(s) to the lower extremitie(s). Patient states that he did have a history of diabetes mellitus. Patient states that she did finish her antibiotic Augmentin regimen. Patient is also going on a trip to [facility] to visit her grandkids for the next 2 weeks. Patient states that the pain has improved.  Objective/Physical Exam General: The patient is alert and oriented x3 in no acute distress.  Dermatology:  Wound #1 noted to the left great toe laterally measuring [phone] cm (LxWxD).   To the noted ulceration(s), there is no eschar. There is a moderate amount of slough, fibrin, and necrotic tissue noted. Granulation tissue is red. Wound base is 90% fibrotic and gray. There is a moderate amount of serosanguineous drainage noted. There is no exposed bone tendon ligament or joint. There is exposed deep fascial and muscle layers. There is no malodor. Periwound integrity is macerated. Cellulitis noted on last visit appears to have been resolved. Skin is warm, dry and supple bilateral lower extremities.  Vascular: Palpable pedal pulses bilaterally. No edema or erythema noted. Capillary refill within normal limits.  Neurological: Epicritic and protective threshold absent bilaterally.   Musculoskeletal Exam: Range of motion within normal limits to all pedal and ankle joints bilateral. Muscle strength 5/5 in all groups bilateral.   Assessment: #1 ulceration left great toe secondary to diabetes mellitus #2 diabetes mellitus w/ peripheral neuropathy   Plan of Care:  #1 Patient was evaluated.  #2 medically necessary excisional debridement including subcutaneous tissue was performed using a tissue nipper and a chisel blade. Excisional debridement of all the necrotic nonviable tissue down to healthy bleeding viable tissue was performed with post-debridement measurements same as pre-. #3 the wound was cleansed  and dry sterile dressing applied. #4 dressing change supplies were dispensed to the patient today #5 instructed patient to keep the dressings clean dry and intact for 1 week #6 instructed the patient to go directly to the hospital condition worsens. Instructed patient to apply iodine daily  Ask about her [facility] trip  Dr. Edrick Kins, [place]

## 2016-02-08 NOTE — Progress Notes (Signed)
Subjective:  Patient with a history of diabetes mellitus presents today for follow-up evaluation ulceration(s) to the lower extremitie(s). Patient states that he did have a history of diabetes mellitus. Patient presents today for further treatment and evaluation   Objective/Physical Exam General: The patient is alert and oriented x3 in no acute distress.  Dermatology:  Wound #1 noted to the left great toe measuring [phone] cm (LxWxD).   To the noted ulceration(s), there is no eschar. There is a moderate amount of slough, fibrin, and necrotic tissue noted. Granulation tissue and wound base is red. There is a minimal amount of serosanguineous drainage noted. There is no exposed bone or joint. There is exposed muscle-tendon ligament. There is no malodor. Periwound integrity is macerated. Skin is warm, dry and supple bilateral lower extremities.  Vascular: Palpable pedal pulses bilaterally. No edema or erythema noted. Capillary refill within normal limits.  Neurological: Epicritic and protective threshold absent bilaterally.   Musculoskeletal Exam: Range of motion within normal limits to all pedal and ankle joints bilateral. Muscle strength 5/5 in all groups bilateral.   Assessment: #1 ulceration left great toe secondary to diabetes mellitus #2 diabetes mellitus w/ peripheral neuropathy   Plan of Care:  #1 Patient was evaluated. #2 medically necessary excisional debridement including muscle and deep fascial layers of tissue was performed using a tissue nipper and a chisel blade. Excisional debridement of all the necrotic nonviable tissue down to healthy bleeding viable tissue was performed with post-debridement measurements same as pre-. #3 the wound was cleansed and dry sterile dressing applied. #4 collagen dressing applied. #5 postoperative shoe dispensed. #6 patient is to return to clinic in 1 weeks.   Edrick Kins, DPM [facility]  Dr. Edrick Kins, [address]                                        [place], [place] [address]                Office [phone]  Fax [phone]

## 2016-03-03 ENCOUNTER — Ambulatory Visit (INDEPENDENT_AMBULATORY_CARE_PROVIDER_SITE_OTHER): Payer: Medicaid Other | Admitting: Podiatry

## 2016-03-03 ENCOUNTER — Telehealth: Payer: Self-pay | Admitting: Podiatry

## 2016-03-03 DIAGNOSIS — I70245 Atherosclerosis of native arteries of left leg with ulceration of other part of foot: Secondary | ICD-10-CM | POA: Diagnosis not present

## 2016-03-03 DIAGNOSIS — L97509 Non-pressure chronic ulcer of other part of unspecified foot with unspecified severity: Secondary | ICD-10-CM

## 2016-03-03 DIAGNOSIS — E0843 Diabetes mellitus due to underlying condition with diabetic autonomic (poly)neuropathy: Secondary | ICD-10-CM

## 2016-03-03 DIAGNOSIS — E1143 Type 2 diabetes mellitus with diabetic autonomic (poly)neuropathy: Secondary | ICD-10-CM

## 2016-03-03 DIAGNOSIS — L97522 Non-pressure chronic ulcer of other part of left foot with fat layer exposed: Secondary | ICD-10-CM

## 2016-03-03 DIAGNOSIS — E08621 Diabetes mellitus due to underlying condition with foot ulcer: Secondary | ICD-10-CM

## 2016-03-03 MED ORDER — OXYCODONE-ACETAMINOPHEN 5-325 MG PO TABS: 1.0000 | ORAL_TABLET | Freq: Three times a day (TID) | ORAL | 0 refills | Status: DC | PRN

## 2016-03-03 MED ORDER — GENTAMICIN SULFATE 0.1 % EX CREA: 1.0000 "application " | TOPICAL_CREAM | Freq: Three times a day (TID) | CUTANEOUS | 0 refills | Status: DC

## 2016-03-03 NOTE — Telephone Encounter (Signed)
The Pharmacy called about the Gentamicin Cream for pt and they only have the ointment, was wondering could they change it to ointment. If not they can order creme and it will be in tomorrow

## 2016-03-08 NOTE — Progress Notes (Signed)
Subjective:  Patient with a history of diabetes mellitus presents today for evaluation ulceration(s) to the lower extremitie(s). Patient states that he did have a history of diabetes mellitus. Patient just returned from a trip to [facility] to visit her grandchildren. Patient states that she believes her ulceration has improved greatly over the past few weeks.  Objective/Physical Exam General: The patient is alert and oriented x3 in no acute distress.  Dermatology:  Wound #1 noted to the left great toe laterally measuring [phone] cm (LxWxD).   To the noted ulceration(s), there is no eschar. There is a moderate amount of slough, fibrin, and necrotic tissue noted. Granulation tissue is red. Wound base is red. There is a moderate amount of serosanguineous drainage noted. There is no exposed bone tendon ligament or joint. There is exposed deep fascial and muscle layers. There is no malodor. Periwound integrity is macerated. Cellulitis noted on last visit appears to have been resolved. Skin is warm, dry and supple bilateral lower extremities.  Vascular: Palpable pedal pulses bilaterally. No edema or erythema noted. Capillary refill within normal limits.  Neurological: Epicritic and protective threshold absent bilaterally.   Musculoskeletal Exam: Range of motion within normal limits to all pedal and ankle joints bilateral. Muscle strength 5/5 in all groups bilateral.   Assessment: #1 ulceration left great toe secondary to diabetes mellitus #2 diabetes mellitus w/ peripheral neuropathy   Plan of Care:  #1 Patient was evaluated.  #2 medically necessary excisional debridement including subcutaneous tissue was performed using a tissue nipper and a chisel blade. Excisional debridement of all the necrotic nonviable tissue down to healthy bleeding viable tissue was performed with post-debridement measurements same as pre-. #3 the wound was cleansed and dry sterile dressing applied. #4 dressing  change supplies were dispensed to the patient today #5 instructed patient to keep the dressings clean dry and intact for 1 week #6 instructed the patient to go directly to the hospital condition worsens. Instructed patient to apply iodine daily #7 prescription for gentamicin cream #8 prescription for Percocet #9 return to clinic in 2 weeks  Dr. Edrick Kins, [place]

## 2016-03-12 ENCOUNTER — Encounter: Payer: Self-pay | Admitting: Podiatry

## 2016-03-12 ENCOUNTER — Ambulatory Visit (INDEPENDENT_AMBULATORY_CARE_PROVIDER_SITE_OTHER): Payer: Medicaid Other | Admitting: Podiatry

## 2016-03-12 VITALS — BP 142/88 | HR 89 | Temp 97.7°F | Resp 18

## 2016-03-12 DIAGNOSIS — E1143 Type 2 diabetes mellitus with diabetic autonomic (poly)neuropathy: Secondary | ICD-10-CM

## 2016-03-12 DIAGNOSIS — L03032 Cellulitis of left toe: Secondary | ICD-10-CM | POA: Diagnosis not present

## 2016-03-12 DIAGNOSIS — E0843 Diabetes mellitus due to underlying condition with diabetic autonomic (poly)neuropathy: Secondary | ICD-10-CM | POA: Diagnosis not present

## 2016-03-12 NOTE — Progress Notes (Signed)
Subjective: Ms. Edmundson presents to the office for concerns of increased pain and redness to her left big toe.She states that overall she feels sick and is crying because she does not feel well.  I have seen her once previously when this started to reoccur and she was placed on clinda/cipro. She did not have much response to that and was placed on Augmentin by Dr. Amalia Hailey. She states she has been on antibiotics for about 3 months. She presents today with increased pain. Not currently on antibiotics. Denies any systemic complaints such as fevers, chills, nausea, vomiting. No acute changes since last appointment, and no other complaints at this time.   Objective: AAO x3, NAD DP/PT pulses palpable bilaterally, CRT less than 3 seconds On the left hallux there are 2 areas of hyperkeratotic tissue. Upon debridement no underlying ulcer. There is edema and erythema to the hallux. There is mild edema to the foot. There is no ascending cellulitis. There is maceration in the 1st interspace. No drainage or pus was expressed today. There are no areas of fluctuance or crepitance.  No other open lesions or pre-ulcerative lesions.  No pain with calf compression, swelling, warmth, erythema  Assessment: Left hallux cellulitis, failed PO abx.   Plan: -All treatment options discussed with the patient including all alternatives, risks, complications.  -I discussed with her going back on PO abx, get an MRI and lab work. However she states that she feels really bad today. She has been on numerous PO abx without any apparent relief. At this time I discussed with her IV abx. I recommend admission to the hospital for this. I was going to directly admit her but she has to get her grand kids and she will not be able to go until tonight. Because of this I discussed with her to go to the ER. She verbally states that she will go to the ER tonight after she drops of her grandchildren. I discussed with her that if this is not treated she  will likely end up with an amputation, which may need to occur anyway. She states she will go to the ER. Because of this I did not prescribe any antibiotic or pain medication today. She again verbalizes understanding.  -Patient encouraged to call the office with any questions, concerns, change in symptoms.    Celesta Gentile, DPM

## 2016-03-12 NOTE — Patient Instructions (Signed)
Do you your worsening pain and overall feeling ill, it would be best that you go to the hospital for likely IV antibiotics. You have been on oral antibiotics for 3 months without any improvement and is worsening.

## 2016-03-17 ENCOUNTER — Ambulatory Visit: Payer: Medicaid Other | Admitting: Podiatry

## 2016-03-19 ENCOUNTER — Telehealth: Payer: Self-pay | Admitting: *Deleted

## 2016-03-19 NOTE — Telephone Encounter (Addendum)
-----   Message from Trula Slade, DPM sent at 03/17/2016  2:39 PM EST ----- I sent this patient to the ER last Thursday. I don't see that she went. Can you call her to see if she went somewhere else or how she is doing? If she didn't go then we need to restart antibiotics. We can do Augmentin 875 BID x 10 days. 03/19/2016-Home phone was not accepting calls, and mobile phone message was for [place]. 03/25/2016-Pt called for refill for Oxycodone, states she has an appt 03/27/2016. Dr. Jacqualyn Posey refuses to refill, she did not follow his instruction to go to the ER. 03/27/2016-Jody - [place] asked for Dr. Amalia Hailey DEA number. I gave him [id number]. Jody asked if Dr. Amalia Hailey really meant to prescribe pt 900 Gabapentin 400mg . I told Jody Dr. Amalia Hailey meant #90 Gabapentin 400mg .

## 2016-03-26 NOTE — Telephone Encounter (Signed)
Attempted to call patient on home phone, had to leave voice mail to return a call to office, then called cell phone, voice mail was for [place].  No voice message left.

## 2016-03-27 ENCOUNTER — Ambulatory Visit (INDEPENDENT_AMBULATORY_CARE_PROVIDER_SITE_OTHER): Payer: Medicaid Other | Admitting: Podiatry

## 2016-03-27 DIAGNOSIS — E1143 Type 2 diabetes mellitus with diabetic autonomic (poly)neuropathy: Secondary | ICD-10-CM | POA: Diagnosis not present

## 2016-03-27 DIAGNOSIS — E0843 Diabetes mellitus due to underlying condition with diabetic autonomic (poly)neuropathy: Secondary | ICD-10-CM

## 2016-03-27 DIAGNOSIS — L97509 Non-pressure chronic ulcer of other part of unspecified foot with unspecified severity: Secondary | ICD-10-CM | POA: Diagnosis not present

## 2016-03-27 DIAGNOSIS — I70245 Atherosclerosis of native arteries of left leg with ulceration of other part of foot: Secondary | ICD-10-CM

## 2016-03-27 DIAGNOSIS — E08621 Diabetes mellitus due to underlying condition with foot ulcer: Secondary | ICD-10-CM

## 2016-03-27 MED ORDER — OXYCODONE-ACETAMINOPHEN 5-325 MG PO TABS: 1.0000 | ORAL_TABLET | Freq: Three times a day (TID) | ORAL | 0 refills | Status: DC | PRN

## 2016-03-27 MED ORDER — GABAPENTIN 400 MG PO CAPS: 1200.0000 mg | ORAL_CAPSULE | Freq: Three times a day (TID) | ORAL | 1 refills | Status: DC

## 2016-03-29 NOTE — Progress Notes (Signed)
   Subjective:  Patient with a history of diabetes mellitus presents today for follow-up evaluation of an ulceration to the left great toe. Patient has had this ulceration for several weeks now. Patient states she is still in moderate pain regarding her ulceration. Patient does state that it's improved over the past few weeks. Patient was last seen on 03/12/2016 under the care of Dr. Jacqualyn Posey at which time she was recommended to go to the emergency department. Patient did not go to the emergency department.    Objective/Physical Exam General: The patient is alert and oriented x3 in no acute distress.  Dermatology: Complete reepithelialization of the ulceration site has occurred. The ulcer is now healed. Negative for drainage or open wound. After debridement of the overlying eschar there is evidence of complete wound healing. Skin is warm, dry and supple bilateral lower extremities. Negative for open lesions or macerations.  Vascular: Palpable pedal pulses bilaterally. No edema or erythema noted. Capillary refill within normal limits.  Neurological: Epicritic and protective threshold grossly intact bilaterally.   Musculoskeletal Exam: Range of motion within normal limits to all pedal and ankle joints bilateral. Muscle strength 5/5 in all groups bilateral.   Assessment: #1 ulceration left foot secondary to diabetes mellitus-healed   Plan of Care:  #1 Patient was evaluated. #2 medically necessary excisional debridement of the ulceration site was performed. Excisional debridement of all the overlying crust and necrotic tissue was performed using a tissue nipper without incident. #3 antibiotic ointment was applied #4 prescription for gabapentin #5 prescription for Percocet 5/325 mg #6 ulceration is resolved. Return to clinic when necessary   Edrick Kins, DPM [facility]  Dr. Edrick Kins, [address]                                        [place], [place]  [address]                Office [phone]  Fax [phone]

## 2016-04-06 ENCOUNTER — Ambulatory Visit: Payer: Medicaid Other | Admitting: Podiatry

## 2016-05-19 ENCOUNTER — Ambulatory Visit (INDEPENDENT_AMBULATORY_CARE_PROVIDER_SITE_OTHER): Payer: Medicaid Other | Admitting: Podiatry

## 2016-05-19 DIAGNOSIS — L603 Nail dystrophy: Secondary | ICD-10-CM | POA: Diagnosis not present

## 2016-05-19 DIAGNOSIS — B351 Tinea unguium: Secondary | ICD-10-CM

## 2016-05-19 DIAGNOSIS — L84 Corns and callosities: Secondary | ICD-10-CM | POA: Diagnosis not present

## 2016-05-19 DIAGNOSIS — L608 Other nail disorders: Secondary | ICD-10-CM | POA: Diagnosis not present

## 2016-05-19 DIAGNOSIS — M79609 Pain in unspecified limb: Secondary | ICD-10-CM | POA: Diagnosis not present

## 2016-05-19 DIAGNOSIS — E0843 Diabetes mellitus due to underlying condition with diabetic autonomic (poly)neuropathy: Secondary | ICD-10-CM | POA: Diagnosis not present

## 2016-05-19 MED ORDER — OXYCODONE-ACETAMINOPHEN 5-325 MG PO TABS
1.0000 | ORAL_TABLET | Freq: Three times a day (TID) | ORAL | 0 refills | Status: DC | PRN
Start: 2016-05-19 — End: 2016-05-29

## 2016-05-19 NOTE — Progress Notes (Signed)
   SUBJECTIVE Patient with a history of diabetes mellitus presents to office today complaining of elongated, thickened nails. Pain while ambulating in shoes. She states she was recently in [place] and did a lot of walking. She now has a blood blister on the left great toe and states it is very painful. Patient is unable to trim their own nails.   OBJECTIVE General Patient is awake, alert, and oriented x 3 and in no acute distress. Derm Skin is dry and supple bilateral. Negative open lesions or macerations. Remaining integument unremarkable. Nails are tender, long, thickened and dystrophic with subungual debris, consistent with onychomycosis, 1-5 bilateral. Hyperkeratotic lesion present on the left great toe. Pain on palpation with a central nucleated core noted.  No signs of infection noted. Vasc  DP and PT pedal pulses palpable bilaterally. Temperature gradient within normal limits.  Neuro Epicritic and protective threshold sensation diminished bilaterally.  Musculoskeletal Exam Pain on palpation at the keratotic lesion noted. No symptomatic pedal deformities noted bilateral. Muscular strength within normal limits.  ASSESSMENT 1. Diabetes Mellitus w/ peripheral neuropathy 2. Onychomycosis of nail due to dermatophyte bilateral 3. Pain in bilateral feet 4. 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 Patient evaluated today. 2. Instructed to maintain good pedal hygiene and foot care. Stressed importance of controlling blood sugar.  3. Mechanical debridement of nails 1-5 bilaterally performed using a nail nipper. Filed with dremel without incident.  4. Excisional debridement of  keratoic lesion using a chisel blade was performed without incident.  5. Treated area(s) with Salinocaine and dressed with light dressing. 6. Prescription for Percocet 5/325 mg 7. Return to clinic in 4 weeks.     Edrick Kins, DPM [facility]  Dr. Edrick Kins, [place]                                        [place], [place] [address]                Office [phone]  Fax [phone]

## 2016-05-28 ENCOUNTER — Telehealth: Payer: Self-pay | Admitting: Podiatry

## 2016-05-28 NOTE — Telephone Encounter (Signed)
I just spoke with Angie here in [place]. She is taking care of it.  Thanks, Dr. Amalia Hailey

## 2016-05-28 NOTE — Telephone Encounter (Signed)
Refill on pain med

## 2016-05-29 ENCOUNTER — Other Ambulatory Visit: Payer: Self-pay

## 2016-05-29 MED ORDER — OXYCODONE-ACETAMINOPHEN 5-325 MG PO TABS: 1.0000 | ORAL_TABLET | Freq: Three times a day (TID) | ORAL | 0 refills | Status: DC | PRN

## 2016-05-29 NOTE — Progress Notes (Signed)
Rx for Oxycodone/apap has been printed and signed by Dr. Amalia Hailey.  Patient notified to pick up rx at front desk

## 2016-05-29 NOTE — Telephone Encounter (Signed)
Rx for Oxycodone/apap has been printed and signed by Dr. Amalia Hailey.  Patient notified to pick up rx at front desk

## 2016-06-02 ENCOUNTER — Ambulatory Visit: Payer: Medicaid Other | Admitting: Podiatry

## 2016-06-09 ENCOUNTER — Ambulatory Visit (INDEPENDENT_AMBULATORY_CARE_PROVIDER_SITE_OTHER): Payer: Medicaid Other

## 2016-06-09 ENCOUNTER — Encounter: Payer: Self-pay | Admitting: Podiatry

## 2016-06-09 ENCOUNTER — Ambulatory Visit (INDEPENDENT_AMBULATORY_CARE_PROVIDER_SITE_OTHER): Payer: Medicaid Other | Admitting: Podiatry

## 2016-06-09 DIAGNOSIS — M205X2 Other deformities of toe(s) (acquired), left foot: Secondary | ICD-10-CM | POA: Diagnosis not present

## 2016-06-09 DIAGNOSIS — R2242 Localized swelling, mass and lump, left lower limb: Secondary | ICD-10-CM

## 2016-06-09 DIAGNOSIS — M216X2 Other acquired deformities of left foot: Secondary | ICD-10-CM | POA: Diagnosis not present

## 2016-06-09 MED ORDER — OXYCODONE-ACETAMINOPHEN 5-325 MG PO TABS: 1.0000 | ORAL_TABLET | Freq: Three times a day (TID) | ORAL | 0 refills | Status: DC | PRN

## 2016-06-09 NOTE — Progress Notes (Signed)
   HPI:  46 year old female with a history of diabetes mellitus and is a well-established patient presents today for left foot pain. Patient states the pain is out of control and increasingly severe. Patient states that now the pain is extending proximal to her left knee. Patient has been treated in the past for diabetic foot ulcers.    Physical Exam: General: The patient is alert and oriented x3 in no acute distress.  Dermatology: Skin is warm, dry and supple bilateral lower extremities. Negative for open lesions or macerations.  Vascular: Palpable pedal pulses bilaterally. No edema or erythema noted. Capillary refill within normal limits.  Neurological: Epicritic and protective threshold grossly intact bilaterally.   Musculoskeletal Exam: Significant pain on palpation to the dorsal first MPJ where there appears to be an overlying soft tissue mass. The mass appears to be non-adhered to the osseous structures.  Radiographic Exam:  Normal osseous mineralization. Joint spaces preserved. No fracture/dislocation/boney destruction.  There does appear to be some mild degenerative changes to the first MPJ left foot  Assessment: 1. DJD first MPJ left foot 2. Soft tissue mass dorsal first MPJ left foot 3. Chronic left foot pain   Plan of Care:  1. Patient was evaluated. 2. Today we discussed additional conservative versus surgical management of the pain regarding the first MPJ left foot. Patient opts for surgical management. Surgical management will include cheilectomy with debridement of the dorsal soft tissue mass left foot.  3. All patient questions were answered. No guarantees were expressed or implied. All possible competitions and details of procedure were explained. 4. Refill Prescription for Percocet 5/325 mg 5. The patient will require medical clearance prior to surgery.  6. Return to clinic 1 week postop   Edrick Kins, DPM [facility]  Dr. Edrick Kins, [address]                                        [place], [place] [address]                Office [phone]  Fax [phone]

## 2016-06-10 ENCOUNTER — Telehealth: Payer: Self-pay | Admitting: *Deleted

## 2016-06-10 NOTE — Telephone Encounter (Signed)
"  I need to schedule my surgery.  I saw Dr. Amalia Hailey yesterday."  When would you like to schedule it?  "I'd like to do it as soon as possible."  He can do it on May 10 or 17.  "Let me do it on May 17.  I have to do it after Mothers Day."  I'll get it scheduled.  "What time will I need to get there?"  Someone from the surgical center will call you a day or two prior to surgery date with the arrival time.

## 2016-06-15 NOTE — Telephone Encounter (Signed)
I gave patient an Rx for pain last visit. She can come and pick up a refill Rx of Percocet 5/325mg  #21 tomorrow (06/16/16).  Delydia, patient had authorization for surgery initiated on 06/09/16. Patient needs medical clearance prior to surgery.  Thanks, Dr. Amalia Hailey

## 2016-06-18 ENCOUNTER — Encounter: Payer: Self-pay | Admitting: *Deleted

## 2016-06-19 ENCOUNTER — Ambulatory Visit: Payer: Medicaid Other | Admitting: Podiatry

## 2016-06-25 ENCOUNTER — Other Ambulatory Visit: Payer: Self-pay

## 2016-06-25 ENCOUNTER — Telehealth: Payer: Self-pay | Admitting: *Deleted

## 2016-06-25 MED ORDER — OXYCODONE-ACETAMINOPHEN 5-325 MG PO TABS: 1.0000 | ORAL_TABLET | Freq: Three times a day (TID) | ORAL | 0 refills | Status: DC | PRN

## 2016-06-25 NOTE — Telephone Encounter (Signed)
"  I was scheduled to have surgery today.  I woke up this morning not feeling well.  My daughter checked my temperature and I had a fever of 101.  I am not going to be able to do my surgery today.  Can I reschedule to next week?"  No, he doesn't have anything next week.  He can do it on May 31 or June 7.  "Let's do it on May 31.  He doesn't have anything sooner?"  No, he does not have anything sooner.  I will call and let them know at the surgical center that you are canceling for today.  "I already called them and told them."    I called and left Caren Griffins, scheduler at [facility], a message to reschedule patient's surgery to 07/09/2016 due to illness.

## 2016-06-25 NOTE — Progress Notes (Signed)
Patient called requesting refill of pain medication.  She canceled her surgery for today and rescheduled for 07/09/16.  Per Dr. Amalia Hailey, ok to refill Oxycodone 5/325 #21.  Rx has been printed and patient aware to pick up at front desk

## 2016-06-26 ENCOUNTER — Ambulatory Visit: Payer: Medicaid Other | Admitting: Podiatry

## 2016-07-03 ENCOUNTER — Encounter: Payer: Medicaid Other | Admitting: Podiatry

## 2016-07-07 ENCOUNTER — Ambulatory Visit (INDEPENDENT_AMBULATORY_CARE_PROVIDER_SITE_OTHER): Payer: Medicaid Other | Admitting: Podiatry

## 2016-07-07 ENCOUNTER — Encounter: Payer: Self-pay | Admitting: Podiatry

## 2016-07-07 DIAGNOSIS — E1142 Type 2 diabetes mellitus with diabetic polyneuropathy: Secondary | ICD-10-CM

## 2016-07-07 DIAGNOSIS — M7752 Other enthesopathy of left foot: Secondary | ICD-10-CM | POA: Diagnosis not present

## 2016-07-07 DIAGNOSIS — L84 Corns and callosities: Secondary | ICD-10-CM

## 2016-07-07 MED ORDER — OXYCODONE-ACETAMINOPHEN 5-325 MG PO TABS: 1.0000 | ORAL_TABLET | Freq: Three times a day (TID) | ORAL | 0 refills | Status: DC | PRN

## 2016-07-09 NOTE — Progress Notes (Signed)
   HPI: Patient is a 46 year old female presenting today with a complaint of redness and swelling to the left great toe that has been worsening over the past 2 months. She is scheduled for cheilectomy surgery on 07/09/16.    Physical Exam: General: The patient is alert and oriented x3 in no acute distress.  Dermatology: Skin is warm, dry and supple bilateral lower extremities. Negative for open lesions or macerations.  Vascular: Palpable pedal pulses bilaterally. No edema or erythema noted. Capillary refill within normal limits.  Neurological: Epicritic and protective threshold grossly intact bilaterally.   Musculoskeletal Exam: Significant pain on palpation to the dorsal first MPJ where there appears to be an overlying soft tissue mass. The mass appears to be non-adhered to the osseous structures.   Assessment: 1. DM with neuropathy 2. Pre-ulcerative callus left great toe 3. First MPJ capsulitis left   Plan of Care:  1. Patient was evaluated. 2. Excisional debridement of keratoic lesion using a chisel blade was performed without incident.  3. Prescription for Percocet 5/325 mg given to patient. 4. Referral to pain management. 5. Return to clinic when necessary.   Edrick Kins, DPM [facility]  Dr. Edrick Kins, [address]                                        [place], [place] [address]                Office [phone]  Fax [phone]

## 2016-07-10 ENCOUNTER — Encounter: Payer: Medicaid Other | Admitting: Podiatry

## 2016-07-15 ENCOUNTER — Encounter: Payer: Self-pay | Admitting: Podiatry

## 2016-07-15 ENCOUNTER — Telehealth: Payer: Self-pay | Admitting: Podiatry

## 2016-07-15 NOTE — Telephone Encounter (Signed)
Yes, please refill.  Thanks, Dr. Amalia Hailey

## 2016-07-15 NOTE — Telephone Encounter (Signed)
Pt called and needs a pain medication refill today she is in a lot of pain.

## 2016-07-15 NOTE — Telephone Encounter (Signed)
I tried to contact patient at phone number listed and was unable to leave message because voice mail is full.

## 2016-07-15 NOTE — Telephone Encounter (Signed)
Pt also stated she has not heard from pain management office yet to get an appt

## 2016-07-16 ENCOUNTER — Telehealth: Payer: Self-pay

## 2016-07-16 MED ORDER — OXYCODONE-ACETAMINOPHEN 5-325 MG PO TABS: 1.0000 | ORAL_TABLET | Freq: Three times a day (TID) | ORAL | 0 refills | Status: DC | PRN

## 2016-07-16 NOTE — Telephone Encounter (Signed)
Patient called requesting refill of her pain medication.  Per Dr. Amalia Hailey, ok to refill Oxycodone 5/325.  Rx has been printed and left at front desk for patient pick up.   Referral and office notes have been faxed to [facility] pain management [place].  They will contact patient for appt

## 2016-07-17 ENCOUNTER — Encounter: Payer: Medicaid Other | Admitting: Podiatry

## 2016-07-17 ENCOUNTER — Ambulatory Visit (INDEPENDENT_AMBULATORY_CARE_PROVIDER_SITE_OTHER): Payer: Medicaid Other | Admitting: Podiatry

## 2016-07-17 DIAGNOSIS — L84 Corns and callosities: Secondary | ICD-10-CM

## 2016-07-17 DIAGNOSIS — E0842 Diabetes mellitus due to underlying condition with diabetic polyneuropathy: Secondary | ICD-10-CM

## 2016-07-19 NOTE — Progress Notes (Signed)
   HPI: Patient is a 46 year old female presenting today for follow up evaluation of a pre-ulcerative callus of the left great toe. She reports associated redness and soreness to the touch of the left great toe. She reports pain of the left foot that radiates the LLE to the hip. She has not done anything to treat her pain. There are no modifying factors noted. She is here for further evaluation and treatment.   Physical Exam: General: The patient is alert and oriented x3 in no acute distress.  Dermatology: Skin is warm, dry and supple bilateral lower extremities. Negative for open lesions or macerations.  Vascular: Palpable pedal pulses bilaterally. No edema or erythema noted. Capillary refill within normal limits.  Neurological: Epicritic and protective threshold grossly intact bilaterally.   Musculoskeletal Exam: Significant pain on palpation to the dorsal first MPJ where there appears to be an overlying soft tissue mass. The mass appears to be non-adhered to the osseous structures.   Assessment: 1. DM with neuropathy 2. Pre-ulcerative callus left great toe 3. Hyperglycemia   Plan of Care:  1. Patient was evaluated. 2. Excisional debridement of keratoic lesion using a chisel blade was performed without incident.  3. Return to clinic in 4 weeks.  Edrick Kins, DPM [facility]  Dr. Edrick Kins, [address]                                        [place], [place] [address]                Office [phone]  Fax [phone]

## 2016-07-24 ENCOUNTER — Telehealth: Payer: Self-pay | Admitting: *Deleted

## 2016-07-24 NOTE — Telephone Encounter (Signed)
Patient called requesting refill on gabapentin and oxycodone.  Per Dr. Amalia Hailey - okay to refill both Rx  Called patient-L/M for pt that she could pick up Rx in [place] office or wait until Monday and pick up in [place]. Request she call me back and let me know.

## 2016-07-27 ENCOUNTER — Telehealth: Payer: Self-pay | Admitting: Podiatry

## 2016-07-27 MED ORDER — BETAMETHASONE SOD PHOS & ACET 6 (3-3) MG/ML IJ SUSP: 3.0000 mg | Freq: Once | INTRAMUSCULAR | Status: DC

## 2016-07-27 NOTE — Telephone Encounter (Signed)
Pt called for RX refill on pain meds and gabapentin. She states that she called [place] Friday and no one returned her phone call been in pain all weekend

## 2016-07-28 ENCOUNTER — Telehealth: Payer: Self-pay

## 2016-07-28 ENCOUNTER — Encounter: Payer: Medicaid Other | Admitting: Podiatry

## 2016-07-28 MED ORDER — GABAPENTIN 400 MG PO CAPS: 1200.0000 mg | ORAL_CAPSULE | Freq: Three times a day (TID) | ORAL | 1 refills | Status: DC

## 2016-07-28 MED ORDER — OXYCODONE-ACETAMINOPHEN 5-325 MG PO TABS: 1.0000 | ORAL_TABLET | Freq: Three times a day (TID) | ORAL | 0 refills | Status: DC | PRN

## 2016-07-28 NOTE — Telephone Encounter (Signed)
6/181/18 Pt called for RX refill on pain meds and gabapentin. She states that she called [place] office Friday and no one returned her phone call been in pain all weekend   Per Dr. Amalia Hailey, ok to refill her pain medication and Gabapentin as before.   Patient has been notified to pick up rx at front desk.

## 2016-08-07 ENCOUNTER — Other Ambulatory Visit: Payer: Self-pay

## 2016-08-07 MED ORDER — OXYCODONE-ACETAMINOPHEN 5-325 MG PO TABS: 1.0000 | ORAL_TABLET | Freq: Three times a day (TID) | ORAL | 0 refills | Status: DC | PRN

## 2016-08-18 ENCOUNTER — Ambulatory Visit (INDEPENDENT_AMBULATORY_CARE_PROVIDER_SITE_OTHER): Payer: Medicaid Other | Admitting: Podiatry

## 2016-08-18 DIAGNOSIS — L97522 Non-pressure chronic ulcer of other part of left foot with fat layer exposed: Secondary | ICD-10-CM

## 2016-08-18 DIAGNOSIS — E0842 Diabetes mellitus due to underlying condition with diabetic polyneuropathy: Secondary | ICD-10-CM | POA: Diagnosis not present

## 2016-08-18 DIAGNOSIS — I70245 Atherosclerosis of native arteries of left leg with ulceration of other part of foot: Secondary | ICD-10-CM

## 2016-08-18 MED ORDER — OXYCODONE-ACETAMINOPHEN 5-325 MG PO TABS: 1.0000 | ORAL_TABLET | Freq: Three times a day (TID) | ORAL | 0 refills | Status: DC | PRN

## 2016-08-18 MED ORDER — GENTAMICIN SULFATE 0.1 % EX CREA: 1.0000 "application " | TOPICAL_CREAM | Freq: Three times a day (TID) | CUTANEOUS | 1 refills | Status: DC

## 2016-08-22 ENCOUNTER — Encounter (HOSPITAL_COMMUNITY): Payer: Self-pay | Admitting: Emergency Medicine

## 2016-08-22 ENCOUNTER — Inpatient Hospital Stay (HOSPITAL_COMMUNITY)
Admission: EM | Admit: 2016-08-22 | Discharge: 2016-08-24 | DRG: 638 | Discharge status: Against Medical Advice | Payer: Medicaid Other | Attending: Internal Medicine | Admitting: Internal Medicine

## 2016-08-22 DIAGNOSIS — M797 Fibromyalgia: Secondary | ICD-10-CM | POA: Diagnosis present

## 2016-08-22 DIAGNOSIS — E1140 Type 2 diabetes mellitus with diabetic neuropathy, unspecified: Secondary | ICD-10-CM | POA: Diagnosis present

## 2016-08-22 DIAGNOSIS — Z5321 Procedure and treatment not carried out due to patient leaving prior to being seen by health care provider: Secondary | ICD-10-CM | POA: Diagnosis not present

## 2016-08-22 DIAGNOSIS — Z91013 Allergy to seafood: Secondary | ICD-10-CM

## 2016-08-22 DIAGNOSIS — E1165 Type 2 diabetes mellitus with hyperglycemia: Secondary | ICD-10-CM | POA: Diagnosis present

## 2016-08-22 DIAGNOSIS — Z79899 Other long term (current) drug therapy: Secondary | ICD-10-CM

## 2016-08-22 DIAGNOSIS — L97509 Non-pressure chronic ulcer of other part of unspecified foot with unspecified severity: Secondary | ICD-10-CM

## 2016-08-22 DIAGNOSIS — E871 Hypo-osmolality and hyponatremia: Secondary | ICD-10-CM | POA: Diagnosis present

## 2016-08-22 DIAGNOSIS — Z886 Allergy status to analgesic agent status: Secondary | ICD-10-CM

## 2016-08-22 DIAGNOSIS — D751 Secondary polycythemia: Secondary | ICD-10-CM | POA: Diagnosis present

## 2016-08-22 DIAGNOSIS — IMO0001 Reserved for inherently not codable concepts without codable children: Secondary | ICD-10-CM | POA: Diagnosis present

## 2016-08-22 DIAGNOSIS — Z7951 Long term (current) use of inhaled steroids: Secondary | ICD-10-CM

## 2016-08-22 DIAGNOSIS — E118 Type 2 diabetes mellitus with unspecified complications: Secondary | ICD-10-CM

## 2016-08-22 DIAGNOSIS — Z888 Allergy status to other drugs, medicaments and biological substances status: Secondary | ICD-10-CM

## 2016-08-22 DIAGNOSIS — F17210 Nicotine dependence, cigarettes, uncomplicated: Secondary | ICD-10-CM | POA: Diagnosis present

## 2016-08-22 DIAGNOSIS — Z884 Allergy status to anesthetic agent status: Secondary | ICD-10-CM

## 2016-08-22 DIAGNOSIS — Z885 Allergy status to narcotic agent status: Secondary | ICD-10-CM

## 2016-08-22 DIAGNOSIS — L97529 Non-pressure chronic ulcer of other part of left foot with unspecified severity: Secondary | ICD-10-CM

## 2016-08-22 DIAGNOSIS — J449 Chronic obstructive pulmonary disease, unspecified: Secondary | ICD-10-CM | POA: Diagnosis present

## 2016-08-22 DIAGNOSIS — L98499 Non-pressure chronic ulcer of skin of other sites with unspecified severity: Secondary | ICD-10-CM

## 2016-08-22 DIAGNOSIS — Z9109 Other allergy status, other than to drugs and biological substances: Secondary | ICD-10-CM

## 2016-08-22 DIAGNOSIS — Z794 Long term (current) use of insulin: Secondary | ICD-10-CM

## 2016-08-22 DIAGNOSIS — E11621 Type 2 diabetes mellitus with foot ulcer: Secondary | ICD-10-CM | POA: Diagnosis present

## 2016-08-22 LAB — COMPREHENSIVE METABOLIC PANEL
ALK PHOS: 143 U/L — AB (ref 38–126)
ALT: 14 U/L (ref 14–54)
AST: 17 U/L (ref 15–41)
Albumin: 3.7 g/dL (ref 3.5–5.0)
Anion gap: 9 (ref 5–15)
BILIRUBIN TOTAL: 0.5 mg/dL (ref 0.3–1.2)
BUN: 11 mg/dL (ref 6–20)
CALCIUM: 9.1 mg/dL (ref 8.9–10.3)
CO2: 25 mmol/L (ref 22–32)
CREATININE: 0.77 mg/dL (ref 0.44–1.00)
Chloride: 99 mmol/L — ABNORMAL LOW (ref 101–111)
GFR calc non Af Amer: >60 mL/min (ref >60)
GLUCOSE: 452 mg/dL — AB (ref 65–99)
Potassium: 4 mmol/L (ref 3.5–5.1)
Sodium: 133 mmol/L — ABNORMAL LOW (ref 135–145)
TOTAL PROTEIN: 6.3 g/dL — AB (ref 6.5–8.1)

## 2016-08-22 LAB — URINALYSIS, ROUTINE W REFLEX MICROSCOPIC
Bilirubin Urine: NEGATIVE
Glucose, UA: >=500 mg/dL — AB
Hgb urine dipstick: NEGATIVE
Ketones, ur: NEGATIVE mg/dL
NITRITE: NEGATIVE
PH: 5 (ref 5.0–8.0)
Protein, ur: NEGATIVE mg/dL
SPECIFIC GRAVITY, URINE: 1.037 — AB (ref 1.005–1.030)

## 2016-08-22 LAB — CBC WITH DIFFERENTIAL/PLATELET
Basophils Absolute: 0 10*3/uL (ref 0.0–0.1)
Basophils Relative: 0 %
EOS ABS: 0.3 10*3/uL (ref 0.0–0.7)
EOS PCT: 3 %
HCT: 47.7 % — ABNORMAL HIGH (ref 36.0–46.0)
Hemoglobin: 15.8 g/dL — ABNORMAL HIGH (ref 12.0–15.0)
LYMPHS ABS: 2.5 10*3/uL (ref 0.7–4.0)
Lymphocytes Relative: 29 %
MCH: 29.4 pg (ref 26.0–34.0)
MCHC: 33.1 g/dL (ref 30.0–36.0)
MCV: 88.8 fL (ref 78.0–100.0)
MONO ABS: 0.6 10*3/uL (ref 0.1–1.0)
Monocytes Relative: 7 %
Neutro Abs: 5.1 10*3/uL (ref 1.7–7.7)
Neutrophils Relative %: 61 %
PLATELETS: 286 10*3/uL (ref 150–400)
RBC: 5.37 MIL/uL — AB (ref 3.87–5.11)
RDW: 12.9 % (ref 11.5–15.5)
WBC: 8.4 10*3/uL (ref 4.0–10.5)

## 2016-08-22 LAB — I-STAT CG4 LACTIC ACID, ED: Lactic Acid, Venous: 2.36 mmol/L (ref 0.5–1.9)

## 2016-08-22 LAB — PROTIME-INR
INR: 0.88
PROTHROMBIN TIME: 11.9 s (ref 11.4–15.2)

## 2016-08-22 MED ORDER — BACITRACIN ZINC 500 UNIT/GM EX OINT
1.0000 "application " | TOPICAL_OINTMENT | Freq: Once | CUTANEOUS | Status: AC
  Administered 2016-08-22: 1 via TOPICAL
  Filled 2016-08-22: qty 0.9

## 2016-08-22 MED ORDER — ONDANSETRON HCL 4 MG/2ML IJ SOLN
4.0000 mg | Freq: Once | INTRAMUSCULAR | Status: AC
  Administered 2016-08-22: 4 mg via INTRAVENOUS
  Filled 2016-08-22: qty 2

## 2016-08-22 MED ORDER — CLINDAMYCIN PHOSPHATE 600 MG/50ML IV SOLN
600.0000 mg | Freq: Once | INTRAVENOUS | Status: AC
  Administered 2016-08-22: 600 mg via INTRAVENOUS
  Filled 2016-08-22: qty 50

## 2016-08-22 MED ORDER — MORPHINE SULFATE (PF) 4 MG/ML IV SOLN
4.0000 mg | Freq: Once | INTRAVENOUS | Status: AC
  Administered 2016-08-22: 4 mg via INTRAVENOUS
  Filled 2016-08-22: qty 1

## 2016-08-22 MED ORDER — SODIUM CHLORIDE 0.9 % IV BOLUS (SEPSIS)
1000.0000 mL | Freq: Once | INTRAVENOUS | Status: AC
  Administered 2016-08-22: 1000 mL via INTRAVENOUS

## 2016-08-22 MED ORDER — OXYCODONE-ACETAMINOPHEN 5-325 MG PO TABS: 1.0000 | ORAL_TABLET | ORAL | Status: DC | PRN

## 2016-08-22 NOTE — ED Notes (Addendum)
Pt requesting to go outside for a smoke break, explained to pt she would need to restart in triage if she left her room. Also, pt would have to have her IV removed if she were to leave. While this nurse was speaking to charge RN and explaining the situation, pt left room to have a smoke outside. This nurse found pt outside, and pt escorted back to room by this nurse and explained if pt leaves again she will be d/c from the hospital and have to re sign in. Pt verbalized understanding. IV intact.

## 2016-08-22 NOTE — ED Triage Notes (Signed)
Pt to ED for diabetic ulcer to bottom of L great toe x 1.5 weeks - saw her PCP on Monday, given abx cream - states the wound opened up 3 days ago and now looks worse with pain shooting up her L leg. Pt called her PCP today and he told her to go to ED for IV abx. Patient's toe swollen, red, discoloration noted. Sensation around wound and on top of foot decreased over the past week. No discharge noted at this time. Pt does endorse fevers/chills x 2 days, highest 102F this morning. Took Tylenol and ibuprofen PTA.

## 2016-08-22 NOTE — ED Provider Notes (Signed)
[facility] DEPT Provider Note   CSN: [id number] Arrival date & time: 08/22/16  2057     History   Chief Complaint Chief Complaint  Patient presents with  . Diabetic Ulcer     HPI   Blood pressure 119/87, pulse 85, temperature (!) 97.3 F (36.3 C), temperature source Oral, resp. rate 18, height 5\' 2"  (1.575 m), weight 65.8 kg (145 lb), last menstrual period 03/25/2016, SpO2 97 %.  Katie Guzman is a 46 y.o. female poorly controlled insulin-dependent diabetic, COPD, smoker with emphysema sent by podiatrist for admission for poorly healing diabetic foot ulcer on the left foot. He had evaluated the patient and started her on a gentamicin ointment with little relief. Her blood sugars have been running in the 500s which she states is normal for her. She denies fever but states she generally feels ill and weak. She had a history of septicemia secondary to foot ulcer in the past and states she feels similar. The pain radiates from the left great toe up the leg. She states she's been compliant with her insulin.   Past Medical History:  Diagnosis Date  . Asthma   . Cancer ([place])   . Cellulitis 04/15/2015  . COPD (chronic obstructive pulmonary disease) ([place])   . Diabetes mellitus    Poorly controlled with complications  . Emphysema   . Fibromyalgia   . Neuropathy   . Tenosynovitis of foot 04/16/2015    Patient Active Problem List   Diagnosis Date Noted  . 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 Tenosynovitis of foot 04/16/2015  . Hypokalemia 04/16/2015  . Cellulitis 04/15/2015  . Diabetes mellitus ([place]) 04/15/2015  . Pain syndrome, chronic 04/15/2015  . COPD (chronic obstructive pulmonary disease) ([place]) 04/15/2015  . Diabetic neuropathy ([place]) 04/15/2015  . Diabetes mellitus with complication ([place])   . COPD exacerbation ([place]) 10/19/2014    Past Surgical History:  Procedure Laterality Date  . BACK SURGERY    . CESAREAN SECTION    .  TONSILLECTOMY    . TUBAL LIGATION      OB History    No data available       Home Medications    Prior to Admission medications   Medication Sig Start Date End Date Taking? Authorizing Provider  acetaminophen (TYLENOL) 325 MG tablet Take 650 mg by mouth every 6 (six) hours as needed for mild pain.   Yes [provider]  albuterol (PROVENTIL HFA;VENTOLIN HFA) 108 (90 BASE) MCG/ACT inhaler Inhale 2 puffs into the lungs every 4 (four) hours as needed for wheezing or shortness of breath. For wheeze or shortness of breath 10/23/14  Yes Gouru, Aruna, MD  albuterol (PROVENTIL) (2.5 MG/3ML) 0.083% nebulizer solution Take 2.5 mg by nebulization every 4 (four) hours as needed for wheezing or shortness of breath.    Yes [provider]  fluticasone-salmeterol (ADVAIR HFA) 115-21 MCG/ACT inhaler Inhale 2 puffs into the lungs 2 (two) times daily. 10/23/14  Yes Gouru, Illene Silver, MD  gabapentin (NEURONTIN) 400 MG capsule Take 3 capsules (1,200 mg total) by mouth 3 (three) times daily. Patient taking differently: Take 1,600 mg by mouth 3 (three) times daily.  07/28/16  Yes Edrick Kins, DPM  gentamicin cream (GARAMYCIN) 0.1 % Apply 1 application topically 3 (three) times daily. 08/18/16  Yes Edrick Kins, DPM  ibuprofen (ADVIL,MOTRIN) 600 MG tablet Take 600 mg by mouth every 6 (six) hours as needed for fever or mild pain.   Yes [provider]  insulin aspart (NOVOLOG) 100 UNIT/ML injection Inject 10 Units into the skin 3 (three) times daily with meals. 10/23/14  Yes Gouru, Aruna, MD  insulin glargine (LANTUS) 100 UNIT/ML injection Inject 0.25 mLs (25 Units total) into the skin 2 (two) times daily. Patient taking differently: Inject 28 Units into the skin 2 (two) times daily.  10/23/14  Yes Gouru, Illene Silver, MD  insulin lispro (HUMALOG) 100 UNIT/ML injection Inject 1-12 Units into the skin 4 (four) times daily -  before meals and at bedtime. Sliding scale 07/11/16 07/11/17 Yes [provider]  lidocaine (LIDODERM) 5 % Place 1 patch onto the skin daily as needed. For pain. Remove & Discard patch within 12 hours or as directed by MD   Yes [provider]  lisinopril (PRINIVIL,ZESTRIL) 10 MG tablet Take 10 mg by mouth daily. 07/10/16 07/10/17 Yes [provider]  metFORMIN (GLUCOPHAGE) 500 MG tablet Take 1 tablet (500 mg total) by mouth 2 (two) times daily with a meal. Patient taking differently: Take 500 mg by mouth daily.  10/23/14  Yes Gouru, Aruna, MD  ondansetron (ZOFRAN ODT) 4 MG disintegrating tablet Take 1 tablet (4 mg total) by mouth every 8 (eight) hours as needed for nausea or vomiting. 12/08/15  Yes Carrie Mew, MD  oxyCODONE-acetaminophen (ROXICET) 5-325 MG tablet Take 1 tablet by mouth every 8 (eight) hours as needed for severe pain. 08/18/16  Yes Edrick Kins, DPM  tiotropium (SPIRIVA) 18 MCG inhalation capsule Place 1 capsule (18 mcg total) into inhaler and inhale daily. 10/23/14  Yes Gouru, Aruna, MD  guaiFENesin-dextromethorphan (ROBITUSSIN DM) 100-10 MG/5ML syrup Take 10 mLs by mouth every 6 (six) hours as needed for cough. Patient not taking: Reported on 08/22/2016 10/23/14   Nicholes Mango, MD  Influenza vac split quadrivalent PF (FLUARIX) 0.5 ML injection Inject 0.5 mLs into the muscle tomorrow at 10 am. Patient not taking: Reported on 08/22/2016 10/23/14   Nicholes Mango, MD  naproxen (NAPROSYN) 500 MG tablet Take 1 tablet (500 mg total) by mouth 2 (two) times daily with a meal. Patient not taking: Reported on 08/22/2016 12/08/15   Carrie Mew, MD  nicotine (NICODERM CQ - DOSED IN MG/24 HOURS) 21 mg/24hr patch Place 1 patch (21 mg total) onto the skin daily. Patient not taking: Reported on 08/22/2016 10/23/14   Nicholes Mango, MD  phenazopyridine (PYRIDIUM) 200 MG tablet Take 1 tablet (200 mg total) by mouth 3 (three) times daily as needed for pain. Patient not taking: Reported on 08/22/2016 12/08/15   Carrie Mew, MD  pneumococcal 23  valent vaccine (PNU-IMMUNE) 25 MCG/0.5ML injection Inject 0.5 mLs into the muscle tomorrow at 10 am. Patient not taking: Reported on 08/22/2016 10/23/14   Nicholes Mango, MD  silver sulfADIAZINE (SILVADENE) 1 % cream Apply 1 application topically daily. Patient not taking: Reported on 08/22/2016 12/26/15   Trula Slade, DPM  silver sulfADIAZINE (SILVADENE) 1 % cream Apply 1 application topically daily. Patient not taking: Reported on 08/22/2016 01/13/16   Edrick Kins, DPM    Family History Family History  Problem Relation Age of Onset  . Lung cancer Mother     Social History Social History  Substance Use Topics  . Smoking status: Current Some Day Smoker    Packs/day: 0.50    Years: 30.00    Types: Cigarettes    Last attempt to quit: 10/05/2014  . Smokeless tobacco: Never Used  . Alcohol use No     Allergies   Flexeril [cyclobenzaprine]; Hydrocodone;  Other; Darvocet [propoxyphene n-acetaminophen]; Fish-derived products; Propofol; Toradol [ketorolac tromethamine]; Vicodin [hydrocodone-acetaminophen]; and Ketorolac   Review of Systems Review of Systems  A complete review of systems was obtained and all systems are negative except as noted in the HPI and PMH.    Physical Exam Updated Vital Signs BP (!) 145/79   Pulse 73   Temp (!) 97.3 F (36.3 C) (Oral)   Resp 12   Ht 5\' 2"  (1.575 m)   Wt 65.8 kg (145 lb)   LMP 03/25/2016 (Approximate)   SpO2 98%   BMI 26.52 kg/m   Physical Exam  Constitutional: She is oriented to person, place, and time. She appears well-developed and well-nourished. No distress.  HENT:  Head: Normocephalic and atraumatic.  Mouth/Throat: Oropharynx is clear and moist.  Eyes: Pupils are equal, round, and reactive to light. Conjunctivae and EOM are normal.  Neck: Normal range of motion.  Cardiovascular: Normal rate, regular rhythm and intact distal pulses.   Pulmonary/Chest: Effort normal and breath sounds normal.  Abdominal: Soft. There is no  tenderness.  Musculoskeletal: Normal range of motion.  Neurological: She is alert and oriented to person, place, and time.  Skin: She is not diaphoretic.  0.5cm ulceration to left great toe, cellulitis on the dorsum of the foot at the MTP.  Psychiatric: She has a normal mood and affect.  Nursing note and vitals reviewed.          ED Treatments / Results  Labs (all labs ordered are listed, but only abnormal results are displayed) Labs Reviewed  COMPREHENSIVE METABOLIC PANEL - Abnormal; Notable for the following:       Result Value   Sodium 133 (*)    Chloride 99 (*)    Glucose, Bld 452 (*)    Total Protein 6.3 (*)    Alkaline Phosphatase 143 (*)    All other components within normal limits  CBC WITH DIFFERENTIAL/PLATELET - Abnormal; Notable for the following:    RBC 5.37 (*)    Hemoglobin 15.8 (*)    HCT 47.7 (*)    All other components within normal limits  URINALYSIS, ROUTINE W REFLEX MICROSCOPIC - Abnormal; Notable for the following:    Color, Urine STRAW (*)    APPearance HAZY (*)    Specific Gravity, Urine 1.037 (*)    Glucose, UA >=500 (*)    Leukocytes, UA TRACE (*)    Bacteria, UA RARE (*)    Squamous Epithelial / LPF 0-5 (*)    All other components within normal limits  I-STAT CG4 LACTIC ACID, ED - Abnormal; Notable for the following:    Lactic Acid, Venous 2.36 (*)    All other components within normal limits  CULTURE, BLOOD (ROUTINE X 2)  CULTURE, BLOOD (ROUTINE X 2)  PROTIME-INR  I-STAT CG4 LACTIC ACID, ED    EKG  EKG Interpretation None       Radiology No results found.  Procedures Procedures (including critical care time)  Medications Ordered in ED Medications  sodium chloride 0.9 % bolus 1,000 mL (1,000 mLs Intravenous New Bag/Given 08/22/16 2359)  clindamycin (CLEOCIN) IVPB 600 mg (600 mg Intravenous New Bag/Given 08/22/16 2359)  morphine 4 MG/ML injection 4 mg (4 mg Intravenous Given 08/22/16 2359)  ondansetron (ZOFRAN) injection 4 mg  (4 mg Intravenous Given 08/22/16 2359)  bacitracin ointment 1 application (1 application Topical Given 08/22/16 2359)     Initial Impression / Assessment and Plan / ED Course  I have reviewed the triage vital signs  and the nursing notes.  Pertinent labs & imaging results that were available during my care of the patient were reviewed by me and considered in my medical decision making (see chart for details).     Vitals:   08/22/16 2300 08/22/16 2330 08/22/16 2345 08/23/16 0000  BP: 131/90 119/87 (!) 146/82 (!) 145/79  Pulse: 86 85 76 73  Resp:  18 17 12   Temp:      TempSrc:      SpO2: 97% 97% 97% 98%  Weight:      Height:        Medications  sodium chloride 0.9 % bolus 1,000 mL (1,000 mLs Intravenous New Bag/Given 08/22/16 2359)  clindamycin (CLEOCIN) IVPB 600 mg (600 mg Intravenous New Bag/Given 08/22/16 2359)  morphine 4 MG/ML injection 4 mg (4 mg Intravenous Given 08/22/16 2359)  ondansetron (ZOFRAN) injection 4 mg (4 mg Intravenous Given 08/22/16 2359)  bacitracin ointment 1 application (1 application Topical Given 08/22/16 2359)    Katie Guzman is 46 y.o. female presenting with Diabetic foot ulcer, poorly healing despite gentamicin ointment treatment. Patient's blood sugar is elevated at 450, she has a normal L anion gap and only 5 ketones in the urine. I doubt she is in DKA, her lactic acid is very mildly elevated likely secondary to dehydration from her hyperglycemia. Wound is cleaned and dressed and patient is given fluid bolus, clindamycin and morphine for pain control. Case discussed with Dr. Maudie Mercury, this will be an unassigned admission.      Final Clinical Impressions(s) / ED Diagnoses   Final diagnoses:  Diabetic ulcer of toe of left foot associated with type 2 diabetes mellitus, unspecified ulcer stage [facility])    New Prescriptions New Prescriptions   No medications on file     Waynetta Pean 08/23/16 0008    Lajean Saver, MD 08/23/16 1520

## 2016-08-23 ENCOUNTER — Emergency Department (HOSPITAL_COMMUNITY): Payer: Medicaid Other

## 2016-08-23 ENCOUNTER — Encounter (HOSPITAL_COMMUNITY): Payer: Self-pay | Admitting: Internal Medicine

## 2016-08-23 DIAGNOSIS — L97529 Non-pressure chronic ulcer of other part of left foot with unspecified severity: Secondary | ICD-10-CM | POA: Diagnosis not present

## 2016-08-23 DIAGNOSIS — Z79899 Other long term (current) drug therapy: Secondary | ICD-10-CM | POA: Diagnosis not present

## 2016-08-23 DIAGNOSIS — Z885 Allergy status to narcotic agent status: Secondary | ICD-10-CM | POA: Diagnosis not present

## 2016-08-23 DIAGNOSIS — E11621 Type 2 diabetes mellitus with foot ulcer: Secondary | ICD-10-CM | POA: Diagnosis present

## 2016-08-23 DIAGNOSIS — E871 Hypo-osmolality and hyponatremia: Secondary | ICD-10-CM | POA: Diagnosis present

## 2016-08-23 DIAGNOSIS — Z794 Long term (current) use of insulin: Secondary | ICD-10-CM | POA: Diagnosis not present

## 2016-08-23 DIAGNOSIS — E118 Type 2 diabetes mellitus with unspecified complications: Secondary | ICD-10-CM | POA: Diagnosis not present

## 2016-08-23 DIAGNOSIS — E1140 Type 2 diabetes mellitus with diabetic neuropathy, unspecified: Secondary | ICD-10-CM | POA: Diagnosis present

## 2016-08-23 DIAGNOSIS — F17210 Nicotine dependence, cigarettes, uncomplicated: Secondary | ICD-10-CM | POA: Diagnosis present

## 2016-08-23 DIAGNOSIS — L97509 Non-pressure chronic ulcer of other part of unspecified foot with unspecified severity: Secondary | ICD-10-CM

## 2016-08-23 DIAGNOSIS — Z91013 Allergy to seafood: Secondary | ICD-10-CM | POA: Diagnosis not present

## 2016-08-23 DIAGNOSIS — Z9109 Other allergy status, other than to drugs and biological substances: Secondary | ICD-10-CM | POA: Diagnosis not present

## 2016-08-23 DIAGNOSIS — Z7951 Long term (current) use of inhaled steroids: Secondary | ICD-10-CM | POA: Diagnosis not present

## 2016-08-23 DIAGNOSIS — Z5321 Procedure and treatment not carried out due to patient leaving prior to being seen by health care provider: Secondary | ICD-10-CM | POA: Diagnosis not present

## 2016-08-23 DIAGNOSIS — M797 Fibromyalgia: Secondary | ICD-10-CM | POA: Diagnosis present

## 2016-08-23 DIAGNOSIS — Z884 Allergy status to anesthetic agent status: Secondary | ICD-10-CM | POA: Diagnosis not present

## 2016-08-23 DIAGNOSIS — Z886 Allergy status to analgesic agent status: Secondary | ICD-10-CM | POA: Diagnosis not present

## 2016-08-23 DIAGNOSIS — Z888 Allergy status to other drugs, medicaments and biological substances status: Secondary | ICD-10-CM | POA: Diagnosis not present

## 2016-08-23 DIAGNOSIS — J449 Chronic obstructive pulmonary disease, unspecified: Secondary | ICD-10-CM | POA: Diagnosis present

## 2016-08-23 DIAGNOSIS — L98499 Non-pressure chronic ulcer of skin of other sites with unspecified severity: Secondary | ICD-10-CM

## 2016-08-23 DIAGNOSIS — E1165 Type 2 diabetes mellitus with hyperglycemia: Secondary | ICD-10-CM | POA: Diagnosis present

## 2016-08-23 DIAGNOSIS — D751 Secondary polycythemia: Secondary | ICD-10-CM | POA: Diagnosis present

## 2016-08-23 LAB — COMPREHENSIVE METABOLIC PANEL
ALBUMIN: 3.1 g/dL — AB (ref 3.5–5.0)
ALK PHOS: 142 U/L — AB (ref 38–126)
ALT: 11 U/L — ABNORMAL LOW (ref 14–54)
AST: 17 U/L (ref 15–41)
Anion gap: 7 (ref 5–15)
BILIRUBIN TOTAL: 0.7 mg/dL (ref 0.3–1.2)
BUN: 10 mg/dL (ref 6–20)
CALCIUM: 8.4 mg/dL — AB (ref 8.9–10.3)
CO2: 24 mmol/L (ref 22–32)
Chloride: 103 mmol/L (ref 101–111)
Creatinine, Ser: 0.72 mg/dL (ref 0.44–1.00)
GFR calc Af Amer: >60 mL/min (ref >60)
GFR calc non Af Amer: >60 mL/min (ref >60)
GLUCOSE: 413 mg/dL — AB (ref 65–99)
Potassium: 4.3 mmol/L (ref 3.5–5.1)
Sodium: 134 mmol/L — ABNORMAL LOW (ref 135–145)
TOTAL PROTEIN: 5.2 g/dL — AB (ref 6.5–8.1)

## 2016-08-23 LAB — GLUCOSE, CAPILLARY
Glucose-Capillary: 356 mg/dL — ABNORMAL HIGH (ref 65–99)
Glucose-Capillary: 423 mg/dL — ABNORMAL HIGH (ref 65–99)
Glucose-Capillary: 313 mg/dL — ABNORMAL HIGH (ref 65–99)
Glucose-Capillary: 312 mg/dL — ABNORMAL HIGH (ref 65–99)
Glucose-Capillary: 233 mg/dL — ABNORMAL HIGH (ref 65–99)
Glucose-Capillary: 269 mg/dL — ABNORMAL HIGH (ref 65–99)

## 2016-08-23 LAB — CBC
HCT: 45.3 % (ref 36.0–46.0)
Hemoglobin: 14.8 g/dL (ref 12.0–15.0)
MCH: 29.4 pg (ref 26.0–34.0)
MCHC: 32.7 g/dL (ref 30.0–36.0)
MCV: 89.9 fL (ref 78.0–100.0)
PLATELETS: 233 10*3/uL (ref 150–400)
RBC: 5.04 MIL/uL (ref 3.87–5.11)
RDW: 12.8 % (ref 11.5–15.5)
WBC: 7.3 10*3/uL (ref 4.0–10.5)

## 2016-08-23 LAB — I-STAT CG4 LACTIC ACID, ED: Lactic Acid, Venous: 1.22 mmol/L (ref 0.5–1.9)

## 2016-08-23 LAB — SEDIMENTATION RATE: SED RATE: 1 mm/h (ref 0–22)

## 2016-08-23 MED ORDER — INSULIN ASPART 100 UNIT/ML ~~LOC~~ SOLN
10.0000 [IU] | SUBCUTANEOUS | Status: DC | PRN
  Administered 2016-08-23: 10 [IU] via INTRAVENOUS
  Filled 2016-08-23: qty 0.1

## 2016-08-23 MED ORDER — ALBUTEROL SULFATE (2.5 MG/3ML) 0.083% IN NEBU
2.5000 mg | INHALATION_SOLUTION | RESPIRATORY_TRACT | Status: DC | PRN
  Administered 2016-08-23: 2.5 mg via RESPIRATORY_TRACT
  Filled 2016-08-23: qty 3

## 2016-08-23 MED ORDER — NALOXONE HCL 0.4 MG/ML IJ SOLN
0.4000 mg | Freq: Once | INTRAMUSCULAR | Status: AC
  Administered 2016-08-23: 0.4 mg via INTRAVENOUS
  Filled 2016-08-23: qty 1

## 2016-08-23 MED ORDER — ENOXAPARIN SODIUM 40 MG/0.4ML ~~LOC~~ SOLN
40.0000 mg | SUBCUTANEOUS | Status: DC
  Administered 2016-08-23: 40 mg via SUBCUTANEOUS
  Filled 2016-08-23: qty 0.4

## 2016-08-23 MED ORDER — INSULIN ASPART 100 UNIT/ML ~~LOC~~ SOLN
10.0000 [IU] | Freq: Three times a day (TID) | SUBCUTANEOUS | Status: DC
  Administered 2016-08-23 (×3): 10 [IU] via SUBCUTANEOUS

## 2016-08-23 MED ORDER — MOMETASONE FURO-FORMOTEROL FUM 200-5 MCG/ACT IN AERO
2.0000 | INHALATION_SPRAY | Freq: Two times a day (BID) | RESPIRATORY_TRACT | Status: DC
  Administered 2016-08-23: 2 via RESPIRATORY_TRACT
  Filled 2016-08-23: qty 8.8

## 2016-08-23 MED ORDER — MORPHINE SULFATE (PF) 4 MG/ML IV SOLN
4.0000 mg | Freq: Once | INTRAVENOUS | Status: AC
  Administered 2016-08-23: 4 mg via INTRAVENOUS
  Filled 2016-08-23: qty 1

## 2016-08-23 MED ORDER — PIPERACILLIN-TAZOBACTAM 3.375 G IVPB
3.3750 g | Freq: Three times a day (TID) | INTRAVENOUS | Status: DC
  Administered 2016-08-23 (×2): 3.375 g via INTRAVENOUS
  Filled 2016-08-23 (×4): qty 50

## 2016-08-23 MED ORDER — ONDANSETRON 4 MG PO TBDP: 4.0000 mg | ORAL_TABLET | Freq: Three times a day (TID) | ORAL | Status: DC | PRN

## 2016-08-23 MED ORDER — ALBUTEROL SULFATE HFA 108 (90 BASE) MCG/ACT IN AERS: 2.0000 | INHALATION_SPRAY | RESPIRATORY_TRACT | Status: DC | PRN

## 2016-08-23 MED ORDER — NICOTINE 21 MG/24HR TD PT24
21.0000 mg | MEDICATED_PATCH | Freq: Every day | TRANSDERMAL | Status: DC
  Administered 2016-08-23: 21 mg via TRANSDERMAL
  Filled 2016-08-23: qty 1

## 2016-08-23 MED ORDER — LISINOPRIL 10 MG PO TABS
10.0000 mg | ORAL_TABLET | Freq: Every day | ORAL | Status: DC
  Administered 2016-08-23: 10 mg via ORAL
  Filled 2016-08-23: qty 1

## 2016-08-23 MED ORDER — TIOTROPIUM BROMIDE MONOHYDRATE 18 MCG IN CAPS
18.0000 ug | ORAL_CAPSULE | Freq: Every day | RESPIRATORY_TRACT | Status: DC
  Administered 2016-08-23: 18 ug via RESPIRATORY_TRACT
  Filled 2016-08-23: qty 5

## 2016-08-23 MED ORDER — SODIUM CHLORIDE 0.9 % IV SOLN
1250.0000 mg | Freq: Once | INTRAVENOUS | Status: AC
  Administered 2016-08-23: 1250 mg via INTRAVENOUS
  Filled 2016-08-23: qty 1250

## 2016-08-23 MED ORDER — GABAPENTIN 400 MG PO CAPS
1200.0000 mg | ORAL_CAPSULE | Freq: Three times a day (TID) | ORAL | Status: DC
  Administered 2016-08-23 (×2): 1200 mg via ORAL
  Filled 2016-08-23 (×3): qty 3

## 2016-08-23 MED ORDER — INSULIN GLARGINE 100 UNIT/ML ~~LOC~~ SOLN
28.0000 [IU] | Freq: Two times a day (BID) | SUBCUTANEOUS | Status: DC
  Administered 2016-08-23 (×2): 28 [IU] via SUBCUTANEOUS
  Filled 2016-08-23 (×4): qty 0.28

## 2016-08-23 MED ORDER — VANCOMYCIN HCL IN DEXTROSE 750-5 MG/150ML-% IV SOLN
750.0000 mg | Freq: Two times a day (BID) | INTRAVENOUS | Status: DC
  Administered 2016-08-23: 750 mg via INTRAVENOUS
  Filled 2016-08-23 (×2): qty 150

## 2016-08-23 MED ORDER — OXYCODONE-ACETAMINOPHEN 5-325 MG PO TABS
1.0000 | ORAL_TABLET | Freq: Three times a day (TID) | ORAL | Status: DC | PRN
  Administered 2016-08-23 (×2): 1 via ORAL
  Filled 2016-08-23 (×2): qty 1

## 2016-08-23 MED ORDER — IBUPROFEN 200 MG PO TABS
600.0000 mg | ORAL_TABLET | Freq: Four times a day (QID) | ORAL | Status: DC | PRN
  Administered 2016-08-23 (×2): 600 mg via ORAL
  Filled 2016-08-23 (×2): qty 3

## 2016-08-23 MED ORDER — METFORMIN HCL 500 MG PO TABS
500.0000 mg | ORAL_TABLET | Freq: Every day | ORAL | Status: DC
  Administered 2016-08-23: 500 mg via ORAL
  Filled 2016-08-23: qty 1

## 2016-08-23 MED ORDER — ACETAMINOPHEN 325 MG PO TABS
650.0000 mg | ORAL_TABLET | Freq: Four times a day (QID) | ORAL | Status: DC | PRN
  Administered 2016-08-23: 650 mg via ORAL
  Filled 2016-08-23: qty 2

## 2016-08-23 MED ORDER — PIPERACILLIN-TAZOBACTAM 3.375 G IVPB 30 MIN
3.3750 g | Freq: Once | INTRAVENOUS | Status: AC
  Administered 2016-08-23: 3.375 g via INTRAVENOUS
  Filled 2016-08-23: qty 50

## 2016-08-23 NOTE — Progress Notes (Signed)
Pt was not responsive from time of shift change, and at time of assessment. Called Physician who ordered Narcan. Administered 0.4 mg. Pt woke up very irritable and angry. Pt states, "all I wanted to do was sleep" Security called. Pt refused to have her handbag searched and threatened to leave. House coverage made aware. Pt refused to sign AMA.

## 2016-08-23 NOTE — H&P (Signed)
TRH H&P   Patient Demographics:    Katie Guzman, is a 46 y.o. female  MRN: [id number]   DOB - May 31, 1970  Admit Date - 08/22/2016  Outpatient Primary MD for the patient is [facility]  Referring MD/NP/PA:     Outpatient Specialists: Monico Blitz  Patient coming from: home  Chief Complaint  Patient presents with  . Diabetic Ulcer      HPI:    Katie Guzman  is a 45 y.o. female, w diabetes, nicotine dependence sent to ER for  c/o left foot skin ulcer under the 1st mtp joint for the past 4 weeks.  Pt states started off as a blister.  Pt has been afebrile. Slight yellow discharge.    In ED wbc 8.4, Hgb 15.8,  Glucose 452.  Pt will be admitted for diabetic skin ulcer.     Review of systems:    In addition to the HPI above,  No Fever-chills, No Headache, No changes with Vision or hearing, No problems swallowing food or Liquids, No Chest pain, Cough or Shortness of Breath, No Abdominal pain, No Nausea or Vommitting, Bowel movements are regular, No Blood in stool or Urine, No dysuria,  No new joints pains-aches,  No new weakness, tingling, numbness in any extremity, No recent weight gain or loss, No polyuria, polydypsia or polyphagia, No significant Mental Stressors.  A full 10 point Review of Systems was done, except as stated above, all other Review of Systems were negative.   With Past History of the following :    Past Medical History:  Diagnosis Date  . Asthma   . Cancer ([place])   . Cellulitis 04/15/2015  . COPD (chronic obstructive pulmonary disease) ([place])   . Diabetes mellitus    Poorly controlled with complications  . Emphysema   . Fibromyalgia   . Neuropathy   . Tenosynovitis of foot 04/16/2015      Past Surgical History:  Procedure Laterality Date  . BACK SURGERY    . CESAREAN SECTION    . TONSILLECTOMY    . TUBAL LIGATION         Social History:     Social History  Substance Use Topics  . Smoking status: Current Some Day Smoker    Packs/day: 0.50    Years: 30.00    Types: Cigarettes    Last attempt to quit: 10/05/2014  . Smokeless tobacco: Never Used  . Alcohol use No     Lives - at home  Mobility - walks by self.    Family History :     Family History  Problem Relation Age of Onset  . Lung cancer Mother       Home Medications:   Prior to Admission medications   Medication Sig Start Date End Date Taking? Authorizing Provider  acetaminophen (TYLENOL) 325 MG tablet Take 650 mg by mouth every 6 (six)  hours as needed for mild pain.   Yes [provider]  albuterol (PROVENTIL HFA;VENTOLIN HFA) 108 (90 BASE) MCG/ACT inhaler Inhale 2 puffs into the lungs every 4 (four) hours as needed for wheezing or shortness of breath. For wheeze or shortness of breath 10/23/14  Yes Gouru, Aruna, MD  albuterol (PROVENTIL) (2.5 MG/3ML) 0.083% nebulizer solution Take 2.5 mg by nebulization every 4 (four) hours as needed for wheezing or shortness of breath.    Yes [provider]  fluticasone-salmeterol (ADVAIR HFA) 115-21 MCG/ACT inhaler Inhale 2 puffs into the lungs 2 (two) times daily. 10/23/14  Yes Gouru, Illene Silver, MD  gabapentin (NEURONTIN) 400 MG capsule Take 3 capsules (1,200 mg total) by mouth 3 (three) times daily. Patient taking differently: Take 1,600 mg by mouth 3 (three) times daily.  07/28/16  Yes Edrick Kins, DPM  gentamicin cream (GARAMYCIN) 0.1 % Apply 1 application topically 3 (three) times daily. 08/18/16  Yes Edrick Kins, DPM  ibuprofen (ADVIL,MOTRIN) 600 MG tablet Take 600 mg by mouth every 6 (six) hours as needed for fever or mild pain.   Yes [provider]  insulin aspart (NOVOLOG) 100 UNIT/ML injection Inject 10 Units into the skin 3 (three) times daily with meals. 10/23/14  Yes Gouru, Aruna, MD  insulin glargine (LANTUS) 100 UNIT/ML injection Inject 0.25 mLs (25 Units  total) into the skin 2 (two) times daily. Patient taking differently: Inject 28 Units into the skin 2 (two) times daily.  10/23/14  Yes Gouru, Illene Silver, MD  insulin lispro (HUMALOG) 100 UNIT/ML injection Inject 1-12 Units into the skin 4 (four) times daily -  before meals and at bedtime. Sliding scale 07/11/16 07/11/17 Yes [provider]  lidocaine (LIDODERM) 5 % Place 1 patch onto the skin daily as needed. For pain. Remove & Discard patch within 12 hours or as directed by MD   Yes [provider]  lisinopril (PRINIVIL,ZESTRIL) 10 MG tablet Take 10 mg by mouth daily. 07/10/16 07/10/17 Yes [provider]  metFORMIN (GLUCOPHAGE) 500 MG tablet Take 1 tablet (500 mg total) by mouth 2 (two) times daily with a meal. Patient taking differently: Take 500 mg by mouth daily.  10/23/14  Yes Gouru, Aruna, MD  ondansetron (ZOFRAN ODT) 4 MG disintegrating tablet Take 1 tablet (4 mg total) by mouth every 8 (eight) hours as needed for nausea or vomiting. 12/08/15  Yes Carrie Mew, MD  oxyCODONE-acetaminophen (ROXICET) 5-325 MG tablet Take 1 tablet by mouth every 8 (eight) hours as needed for severe pain. 08/18/16  Yes Edrick Kins, DPM  tiotropium (SPIRIVA) 18 MCG inhalation capsule Place 1 capsule (18 mcg total) into inhaler and inhale daily. 10/23/14  Yes Gouru, Aruna, MD  guaiFENesin-dextromethorphan (ROBITUSSIN DM) 100-10 MG/5ML syrup Take 10 mLs by mouth every 6 (six) hours as needed for cough. Patient not taking: Reported on 08/22/2016 10/23/14   Nicholes Mango, MD  Influenza vac split quadrivalent PF (FLUARIX) 0.5 ML injection Inject 0.5 mLs into the muscle tomorrow at 10 am. Patient not taking: Reported on 08/22/2016 10/23/14   Nicholes Mango, MD  naproxen (NAPROSYN) 500 MG tablet Take 1 tablet (500 mg total) by mouth 2 (two) times daily with a meal. Patient not taking: Reported on 08/22/2016 12/08/15   Carrie Mew, MD  nicotine (NICODERM CQ - DOSED IN MG/24 HOURS) 21 mg/24hr patch Place 1  patch (21 mg total) onto the skin daily. Patient not taking: Reported on 08/22/2016 10/23/14   Nicholes Mango, MD  phenazopyridine (PYRIDIUM) 200  MG tablet Take 1 tablet (200 mg total) by mouth 3 (three) times daily as needed for pain. Patient not taking: Reported on 08/22/2016 12/08/15   Carrie Mew, MD  pneumococcal 23 valent vaccine (PNU-IMMUNE) 25 MCG/0.5ML injection Inject 0.5 mLs into the muscle tomorrow at 10 am. Patient not taking: Reported on 08/22/2016 10/23/14   Nicholes Mango, MD  silver sulfADIAZINE (SILVADENE) 1 % cream Apply 1 application topically daily. Patient not taking: Reported on 08/22/2016 12/26/15   Trula Slade, DPM  silver sulfADIAZINE (SILVADENE) 1 % cream Apply 1 application topically daily. Patient not taking: Reported on 08/22/2016 01/13/16   Edrick Kins, DPM     Allergies:     Allergies  Allergen Reactions  . Flexeril [Cyclobenzaprine] Hives and Nausea And Vomiting  . Hydrocodone Hives  . Other Hives  . Darvocet [Propoxyphene N-Acetaminophen] Hives and Nausea And Vomiting  . Fish-Derived Products Other (See Comments)    Patient does not like to eat fish. Not allergic but prefers not to.  . Propofol   . Toradol [Ketorolac Tromethamine] Nausea And Vomiting  . Vicodin [Hydrocodone-Acetaminophen] Hives and Nausea And Vomiting  . Ketorolac Nausea And Vomiting     Physical Exam:   Vitals  Blood pressure 134/81, pulse 76, temperature (!) 97.3 F (36.3 C), temperature source Oral, resp. rate 16, height '5\' 2"'  (1.575 m), weight 65.8 kg (145 lb), last menstrual period 03/25/2016, SpO2 93 %.   1. General  lying in bed in NAD,   2. Normal affect and insight, Not Suicidal or Homicidal, Awake Alert, Oriented X 3.  3. No F.N deficits, ALL C.Nerves Intact, Strength 5/5 all 4 extremities, Sensation intact all 4 extremities, Plantars down going.  4. Ears and Eyes appear Normal, Conjunctivae clear, PERRLA. Moist Oral Mucosa.  5. Supple Neck, No JVD, No  cervical lymphadenopathy appriciated, No Carotid Bruits.  6. Symmetrical Chest wall movement, Good air movement bilaterally, CTAB.  7. RRR, No Gallops, Rubs or Murmurs, No Parasternal Heave.  8. Positive Bowel Sounds, Abdomen Soft, No tenderness, No organomegaly appriciated,No rebound -guarding or rigidity.  9.  No Cyanosis, Normal Skin Turgor, No Skin Rash or Bruise.  10. Good muscle tone,  joints appear normal , no effusions, Normal ROM.  11. No Palpable Lymph Nodes in Neck or Axillae  0.8cm skin ulcer under the 1st mtp joint left foot. About 0.52m deep   Data Review:    CBC  Recent Labs Lab 08/22/16 2125  WBC 8.4  HGB 15.8*  HCT 47.7*  PLT 286  MCV 88.8  MCH 29.4  MCHC 33.1  RDW 12.9  LYMPHSABS 2.5  MONOABS 0.6  EOSABS 0.3  BASOSABS 0.0   ------------------------------------------------------------------------------------------------------------------  Chemistries   Recent Labs Lab 08/22/16 2125  NA 133*  K 4.0  CL 99*  CO2 25  GLUCOSE 452*  BUN 11  CREATININE 0.77  CALCIUM 9.1  AST 17  ALT 14  ALKPHOS 143*  BILITOT 0.5   ------------------------------------------------------------------------------------------------------------------ estimated creatinine clearance is 77.4 mL/min (by C-G formula based on SCr of 0.77 mg/dL). ------------------------------------------------------------------------------------------------------------------ No results for input(s): TSH, T4TOTAL, T3FREE, THYROIDAB in the last 72 hours.  Invalid input(s): FREET3  Coagulation profile  Recent Labs Lab 08/22/16 2125  INR 0.88   ------------------------------------------------------------------------------------------------------------------- No results for input(s): DDIMER in the last 72 hours. -------------------------------------------------------------------------------------------------------------------  Cardiac Enzymes No results for input(s): CKMB,  TROPONINI, MYOGLOBIN in the last 168 hours.  Invalid input(s): CK ------------------------------------------------------------------------------------------------------------------ No results found for: BNP   ---------------------------------------------------------------------------------------------------------------  Urinalysis  Component Value Date/Time   COLORURINE STRAW (A) 08/22/2016 2138   APPEARANCEUR HAZY (A) 08/22/2016 2138   APPEARANCEUR Hazy 09/05/2013 0213   LABSPEC 1.037 (H) 08/22/2016 2138   LABSPEC 1.031 09/05/2013 0213   PHURINE 5.0 08/22/2016 2138   GLUCOSEU >=500 (A) 08/22/2016 2138   GLUCOSEU >=500 09/05/2013 0213   HGBUR NEGATIVE 08/22/2016 2138   BILIRUBINUR NEGATIVE 08/22/2016 2138   BILIRUBINUR Negative 09/05/2013 0213   KETONESUR NEGATIVE 08/22/2016 2138   PROTEINUR NEGATIVE 08/22/2016 2138   UROBILINOGEN 0.2 05/07/2013 1344   NITRITE NEGATIVE 08/22/2016 2138   LEUKOCYTESUR TRACE (A) 08/22/2016 2138   LEUKOCYTESUR Negative 09/05/2013 0213    ----------------------------------------------------------------------------------------------------------------   Imaging Results:    Dg Foot Complete Left  Result Date: 08/23/2016 CLINICAL DATA:  46 year old female with history of diabetes and diabetic ulcer at the plantar aspect of the great toe. EXAM: LEFT FOOT - COMPLETE 3+ VIEW COMPARISON:  Radiograph dated 01/16/2016 FINDINGS: There is no acute fracture or dislocation. The corticated small bone fragment medial to the navicular most consistent with os navicularis. The bones are well mineralized. No arthritic changes. There is no periosteal reaction or bone erosion to suggest osteomyelitis. There is ulceration of the skin and soft tissues of the plantar aspect of the great toe. No radiopaque foreign object. IMPRESSION: 1. Ulceration of the skin of the plantar aspect of the great toe. 2. No acute osseous pathology or evidence of osteomyelitis by radiograph.  Electronically Signed   By: Anner Crete M.D.   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  Code Status FULL CODE  Likely DC to  home  Condition GUARDED    Consults called: non e  Admission status: obs  Time spent in minutes : 45   Jani Gravel M.D on 08/23/2016 at 3:06 AM  Between 7pm to 7am - Pager - [phone]. After 7am go to [email or web address] - password [email or web address]  [facility] - Office  [phone]

## 2016-08-23 NOTE — ED Notes (Signed)
Admitting paged to this nurse regarding pt admission orders.

## 2016-08-23 NOTE — Progress Notes (Signed)
Pt smoking in bathroom.  Pt informed that is prohibited on property. Nicotine patch ordered by MD.

## 2016-08-23 NOTE — ED Notes (Signed)
Attempted report 

## 2016-08-23 NOTE — ED Notes (Signed)
Pt wound irrigated with sterile water, attempted to remove dried dirt with sterile gauze and sterile water. Pt ulcer wrapped with bacitracin, telfa, and sterile gauze. XR called by this nurse to inform them pt is ready for scan.

## 2016-08-23 NOTE — Evaluation (Signed)
Patient admitted after midnight with a diabetic foot ulcer. She has a long-standing history of poorly controlled diabetes. She requests consult with Dr. Amalia Hailey from podiatry who is treated her in the past. I have placed a consult and communicated with him. He will see the patient.

## 2016-08-23 NOTE — ED Notes (Signed)
Admitting at the bedside.  

## 2016-08-23 NOTE — Progress Notes (Signed)
Pharmacy Antibiotic Note  Katie Guzman is a 46 y.o. female admitted on 08/22/2016 with wound infection.  Pharmacy has been consulted for Zosyn and vancomycin dosing.  Plan: Start Zosyn 3.375 gm IV q8h (4 hour infusion) Give vancomycin 1,250mg  IV x 1, then start vancomycin 750mg  IV Q12h Monitor clinical picture, renal function, VT prn F/U C&S, abx deescalation / LOT   Height: 5\' 2"  (157.5 cm) Weight: 145 lb (65.8 kg) IBW/kg (Calculated) : 50.1  Temp (24hrs), Avg:97.3 F (36.3 C), Min:97.3 F (36.3 C), Max:97.3 F (36.3 C)   Recent Labs Lab 08/22/16 2125 08/22/16 2157 08/23/16 0112  WBC 8.4  --   --   CREATININE 0.77  --   --   LATICACIDVEN  --  2.36* 1.22    Estimated Creatinine Clearance: 77.4 mL/min (by C-G formula based on SCr of 0.77 mg/dL).    Allergies  Allergen Reactions  . Flexeril [Cyclobenzaprine] Hives and Nausea And Vomiting  . Hydrocodone Hives  . Other Hives  . Darvocet [Propoxyphene N-Acetaminophen] Hives and Nausea And Vomiting  . Fish-Derived Products Other (See Comments)    Patient does not like to eat fish. Not allergic but prefers not to.  . Propofol   . Toradol [Ketorolac Tromethamine] Nausea And Vomiting  . Vicodin [Hydrocodone-Acetaminophen] Hives and Nausea And Vomiting  . Ketorolac Nausea And Vomiting    Antimicrobials this admission: Zosyn 7/15 >>  Vancomycin 7/15 >>   Dose adjustments this admission: n/a  Microbiology results: 7/14 BCx: sent  Thank you for allowing pharmacy to be a part of this patient's care.  Elenor Quinones, PharmD, BCPS Clinical Pharmacist Pager [phone] 08/23/2016 3:33 AM

## 2016-08-23 NOTE — ED Notes (Signed)
Patient transported to X-ray 

## 2016-08-23 NOTE — ED Notes (Signed)
Per Dr. Maudie Mercury, on the way to assess pt at this time.

## 2016-08-24 ENCOUNTER — Telehealth: Payer: Self-pay | Admitting: Podiatry

## 2016-08-24 ENCOUNTER — Telehealth: Payer: Self-pay | Admitting: *Deleted

## 2016-08-24 LAB — HIV ANTIBODY (ROUTINE TESTING W REFLEX): HIV Screen 4th Generation wRfx: NONREACTIVE

## 2016-08-24 NOTE — Telephone Encounter (Signed)
Patient called the office, stated that she was in the [facility] ED last night at midnight for pain management. Patient states that she was escorted from the ED by security and asked to leave.  Patient states she is Sepsis and her blood was very acidic. Her pain was at a very high level and she is very sick. Was told by the ED to follow up with Dr. Amalia Hailey asap for treatment. She was treated at the pain management clinic today and has a follow up appointment in 2 wks. Wanted to see Dr. Amalia Hailey asap, so I made her an appt for tomorrow morning.  Patient asked me to let you know about this ED visit. Patient was going to call [facility] to file a formal complaint against the hospital she said for the way that she was treated. Also wanted [place] to know that she called the emergency number for after hours last night and left a message for someone to call her back and no one has called her.

## 2016-08-24 NOTE — Progress Notes (Signed)
Pt escorted by security out the unit.

## 2016-08-24 NOTE — Telephone Encounter (Signed)
Pt states she was in the hospital and had to leave due to something going on, and she has an appt 08/25/2016 and if she can't discuss with him today she will tomorrow at her office visit.

## 2016-08-25 ENCOUNTER — Ambulatory Visit (INDEPENDENT_AMBULATORY_CARE_PROVIDER_SITE_OTHER): Payer: Medicaid Other | Admitting: Podiatry

## 2016-08-25 DIAGNOSIS — I70245 Atherosclerosis of native arteries of left leg with ulceration of other part of foot: Secondary | ICD-10-CM

## 2016-08-25 DIAGNOSIS — E0842 Diabetes mellitus due to underlying condition with diabetic polyneuropathy: Secondary | ICD-10-CM | POA: Diagnosis not present

## 2016-08-25 DIAGNOSIS — L97522 Non-pressure chronic ulcer of other part of left foot with fat layer exposed: Secondary | ICD-10-CM | POA: Diagnosis not present

## 2016-08-25 LAB — HEMOGLOBIN A1C
Hgb A1c MFr Bld: 13.8 % — ABNORMAL HIGH (ref 4.8–5.6)
Mean Plasma Glucose: 349 mg/dL

## 2016-08-25 MED ORDER — OXYCODONE-ACETAMINOPHEN 5-325 MG PO TABS: 1.0000 | ORAL_TABLET | Freq: Three times a day (TID) | ORAL | 0 refills | Status: DC | PRN

## 2016-08-25 NOTE — Progress Notes (Signed)
   Subjective:  46 year old female with a history of uncontrolled diabetes mellitus presents today for follow-up evaluation of an ulcer to the left great toe. Patient states that over the weekend she went to the emergency Department however upon admission she was escorted out of the hospital due to interactions with the nurse insecurity. Patient presents today for further treatment and evaluation   Objective/Physical Exam General: The patient is alert and oriented x3 in no acute distress.  Dermatology:  Wound #1 noted to the plantar aspect of left great toe measuring a partially [phone] cm (LxWxD).   To the noted ulceration(s), there is no eschar. There is a moderate amount of slough, fibrin, and necrotic tissue noted. Granulation tissue and wound base is red. There is a minimal amount of serosanguineous drainage noted. There is no exposed bone muscle-tendon ligament or joint. There is no malodor. Periwound integrity is intact. Skin is warm, dry and supple bilateral lower extremities.  Vascular: Palpable pedal pulses bilaterally. No edema or erythema noted. Capillary refill within normal limits.  Neurological: Epicritic and protective threshold absent bilaterally.   Musculoskeletal Exam: Range of motion within normal limits to all pedal and ankle joints bilateral. Muscle strength 5/5 in all groups bilateral.   Assessment: #1 left great toe ulceration secondary to diabetes mellitus #2 diabetes mellitus w/ peripheral neuropathy   Plan of Care:  #1 Patient was evaluated. #2 medically necessary excisional debridement including subcutaneous tissue was performed using a tissue nipper and a chisel blade. Excisional debridement of all the necrotic nonviable tissue down to healthy bleeding viable tissue was performed with post-debridement measurements same as pre-. #3 the wound was cleansed and dry sterile dressing applied. #4 today a postoperative shoe was dispensed to help offload pressure  from left great toe  #5 refill prescription for Percocet 5/325 mg. Patient was seen by pain management physician on 08/24/2016. She returns to the pain management clinic on 09/08/2016. At that time she will be completely managed for her pain by the pain management physician.  #6 patient is to return to clinic in 2 weeks.   Edrick Kins, DPM [facility]  Dr. Edrick Kins, [address]                                        [place], [place] [address]                Office [phone]  Fax [phone]

## 2016-08-27 LAB — CULTURE, BLOOD (ROUTINE X 2)
CULTURE: NO GROWTH
CULTURE: NO GROWTH
SPECIAL REQUESTS: ADEQUATE
Special Requests: ADEQUATE

## 2016-09-01 ENCOUNTER — Ambulatory Visit: Payer: Medicaid Other | Admitting: Podiatry

## 2016-09-01 ENCOUNTER — Telehealth: Payer: Self-pay

## 2016-09-01 MED ORDER — OXYCODONE-ACETAMINOPHEN 5-325 MG PO TABS: 1.0000 | ORAL_TABLET | Freq: Three times a day (TID) | ORAL | 0 refills | Status: DC | PRN

## 2016-09-01 NOTE — Telephone Encounter (Signed)
Patient called requesting refill of her pain medication.  Per Dr. Amalia Hailey, she can have a refill this time, but no more refills on pain medication due to her seeing pain management. Rx has been printed and left at front desk for pick up

## 2016-09-05 NOTE — Progress Notes (Signed)
     Subjective: 46 year old female with a history of uncontrolled diabetes mellitus presents today or evaluation and treatment of an ulcer to the left great toe. Patient states that the ulcer is worse. She complains of severe pain every office visit. She believes the wound is not better. Patient states the pain medication is not helping her pain.  Objective/Physical Exam General: The patient is alert and oriented x3 in no acute distress.  Dermatology:  Wound #1 noted to the left great toe measuring approximately [phone] cm (LxWxD).   To the noted ulceration(s), there is no eschar. There is a moderate amount of slough, fibrin, and necrotic tissue noted. Granulation tissue and wound base is red. There is a minimal amount of serosanguineous drainage noted. There is no exposed bone muscle-tendon ligament or joint. There is no malodor. Periwound integrity is intact. Skin is warm, dry and supple bilateral lower extremities.  Vascular: Palpable pedal pulses bilaterally. No edema or erythema noted. Capillary refill within normal limits. there does not appear to be any cellulitis or swelling around the ulcer site   Neurological: Epicritic and protective threshold absent bilaterally.   Musculoskeletal Exam: Range of motion within normal limits to all pedal and ankle joints bilateral. Muscle strength 5/5 in all groups bilateral.   Assessment: #1 Left great toe ulceration secondary to diabetes mellitus #2 diabetes mellitus w/ peripheral neuropathy-uncontrolled    Plan of Care:  #1 Patient was evaluated. #2 medically necessary excisional debridement including subcutaneous tissue was performed using a tissue nipper and a chisel blade. Excisional debridement of all the necrotic nonviable tissue down to healthy bleeding viable tissue was performed with post-debridement measurements same as pre-. #3 the wound was cleansed and dry sterile dressing applied. #4 Prescription for gentamicin cream to be  applied daily #5 prescription for Percocet 5/325 mg. Patient recently was seen by pain management physician for an initial consultation and evaluation. No pain medication was prescribed at that initial office visit. Today we're provide pain medication until her next follow-up visit. #6 return to clinic in 2 weeks   Edrick Kins, DPM [facility]  Dr. Edrick Kins, [address]                                        [place], [place] [address]                Office [phone]  Fax [phone]

## 2016-09-11 ENCOUNTER — Ambulatory Visit: Payer: Medicaid Other | Admitting: Podiatry

## 2016-09-22 ENCOUNTER — Ambulatory Visit (INDEPENDENT_AMBULATORY_CARE_PROVIDER_SITE_OTHER): Payer: Medicaid Other | Admitting: Podiatry

## 2016-09-22 ENCOUNTER — Ambulatory Visit (INDEPENDENT_AMBULATORY_CARE_PROVIDER_SITE_OTHER): Payer: Medicaid Other

## 2016-09-22 DIAGNOSIS — L97522 Non-pressure chronic ulcer of other part of left foot with fat layer exposed: Secondary | ICD-10-CM | POA: Diagnosis not present

## 2016-09-22 DIAGNOSIS — E0842 Diabetes mellitus due to underlying condition with diabetic polyneuropathy: Secondary | ICD-10-CM

## 2016-09-22 DIAGNOSIS — I70245 Atherosclerosis of native arteries of left leg with ulceration of other part of foot: Secondary | ICD-10-CM

## 2016-09-22 MED ORDER — OXYCODONE-ACETAMINOPHEN 5-325 MG PO TABS: 1.0000 | ORAL_TABLET | Freq: Three times a day (TID) | ORAL | 0 refills | Status: DC | PRN

## 2016-09-22 MED ORDER — AMOXICILLIN-POT CLAVULANATE 875-125 MG PO TABS: 1.0000 | ORAL_TABLET | Freq: Two times a day (BID) | ORAL | 0 refills | Status: DC

## 2016-09-22 MED ORDER — GABAPENTIN 800 MG PO TABS: 800.0000 mg | ORAL_TABLET | Freq: Three times a day (TID) | ORAL | 1 refills | Status: DC

## 2016-09-26 ENCOUNTER — Encounter: Payer: Self-pay | Admitting: *Deleted

## 2016-09-26 ENCOUNTER — Emergency Department
Admission: EM | Admit: 2016-09-26 | Discharge: 2016-09-26 | Disposition: A | Discharge status: Home / Self Care | Payer: Medicaid Other | Attending: Emergency Medicine | Admitting: Emergency Medicine

## 2016-09-26 DIAGNOSIS — F17210 Nicotine dependence, cigarettes, uncomplicated: Secondary | ICD-10-CM | POA: Diagnosis not present

## 2016-09-26 DIAGNOSIS — Z79899 Other long term (current) drug therapy: Secondary | ICD-10-CM | POA: Diagnosis not present

## 2016-09-26 DIAGNOSIS — M79675 Pain in left toe(s): Secondary | ICD-10-CM | POA: Diagnosis present

## 2016-09-26 DIAGNOSIS — E119 Type 2 diabetes mellitus without complications: Secondary | ICD-10-CM | POA: Diagnosis not present

## 2016-09-26 DIAGNOSIS — J45909 Unspecified asthma, uncomplicated: Secondary | ICD-10-CM | POA: Insufficient documentation

## 2016-09-26 DIAGNOSIS — L97522 Non-pressure chronic ulcer of other part of left foot with fat layer exposed: Secondary | ICD-10-CM | POA: Diagnosis not present

## 2016-09-26 DIAGNOSIS — Z794 Long term (current) use of insulin: Secondary | ICD-10-CM | POA: Insufficient documentation

## 2016-09-26 DIAGNOSIS — J449 Chronic obstructive pulmonary disease, unspecified: Secondary | ICD-10-CM | POA: Diagnosis not present

## 2016-09-26 LAB — WOUND CULTURE

## 2016-09-26 LAB — GLUCOSE, CAPILLARY: GLUCOSE-CAPILLARY: 392 mg/dL — AB (ref 65–99)

## 2016-09-26 NOTE — ED Provider Notes (Signed)
[facility] Emergency Department Provider Note  ____________________________________________  Time seen: Approximately 11:48 PM  I have reviewed the triage vital signs and the nursing notes.   HISTORY  Chief Complaint Toe Pain and Skin Ulcer    HPI Katie Guzman is a 46 y.o. female that presents to the emergency department with left great toe ulcer for several weeks. Patient saw a podiatrist this week and was given Augmentin. She was also given Percocet but states that pain continues. No new complaints or concerns.No fever.   Past Medical History:  Diagnosis Date  . Asthma   . Cancer ([place])    lung  . Cellulitis 04/15/2015  . COPD (chronic obstructive pulmonary disease) ([place])   . Diabetes mellitus    Poorly controlled with complications  . Emphysema   . Fibromyalgia   . Neuropathy   . Tenosynovitis of foot 04/16/2015    Patient Active Problem List   Diagnosis Date Noted  . Skin ulcer ([place]) 08/23/2016  . Diabetic foot ulcer ([place]) 08/23/2016  . Diabetic ulcer of toe of left foot associated with type 2 diabetes mellitus ([place])   . 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 Tenosynovitis of foot 04/16/2015  . Hypokalemia 04/16/2015  . Cellulitis 04/15/2015  . Diabetes mellitus ([place]) 04/15/2015  . Pain syndrome, chronic 04/15/2015  . COPD (chronic obstructive pulmonary disease) ([place]) 04/15/2015  . Diabetic neuropathy ([place]) 04/15/2015  . Diabetes mellitus with complication ([place])   . COPD exacerbation ([place]) 10/19/2014    Past Surgical History:  Procedure Laterality Date  . BACK SURGERY    . CESAREAN SECTION    . TONSILLECTOMY    . TUBAL LIGATION      Prior to Admission medications   Medication Sig Start Date End Date Taking? Authorizing Provider  acetaminophen (TYLENOL) 325 MG tablet Take 650 mg by mouth every 6 (six) hours as needed for mild pain.    [provider]  albuterol (PROVENTIL HFA;VENTOLIN HFA)  108 (90 BASE) MCG/ACT inhaler Inhale 2 puffs into the lungs every 4 (four) hours as needed for wheezing or shortness of breath. For wheeze or shortness of breath 10/23/14   Gouru, Illene Silver, MD  albuterol (PROVENTIL) (2.5 MG/3ML) 0.083% nebulizer solution Take 2.5 mg by nebulization every 4 (four) hours as needed for wheezing or shortness of breath.     [provider]  amoxicillin-clavulanate (AUGMENTIN) 875-125 MG tablet Take 1 tablet by mouth 2 (two) times daily. 09/22/16   Edrick Kins, DPM  fluticasone-salmeterol (ADVAIR HFA) 062-37 MCG/ACT inhaler Inhale 2 puffs into the lungs 2 (two) times daily. 10/23/14   Nicholes Mango, MD  gabapentin (NEURONTIN) 800 MG tablet Take 1 tablet (800 mg total) by mouth 3 (three) times daily. 09/22/16   Edrick Kins, DPM  gentamicin cream (GARAMYCIN) 0.1 % Apply 1 application topically 3 (three) times daily. 08/18/16   Edrick Kins, DPM  guaiFENesin-dextromethorphan (ROBITUSSIN DM) 100-10 MG/5ML syrup Take 10 mLs by mouth every 6 (six) hours as needed for cough. Patient not taking: Reported on 08/22/2016 10/23/14   Nicholes Mango, MD  ibuprofen (ADVIL,MOTRIN) 600 MG tablet Take 600 mg by mouth every 6 (six) hours as needed for fever or mild pain.    [provider]  Influenza vac split quadrivalent PF (FLUARIX) 0.5 ML injection Inject 0.5 mLs into the muscle tomorrow at 10 am. Patient not taking: Reported on 08/22/2016 10/23/14   Nicholes Mango, MD  insulin aspart (NOVOLOG) 100 UNIT/ML injection  Inject 10 Units into the skin 3 (three) times daily with meals. 10/23/14   Gouru, Illene Silver, MD  insulin glargine (LANTUS) 100 UNIT/ML injection Inject 0.25 mLs (25 Units total) into the skin 2 (two) times daily. Patient taking differently: Inject 28 Units into the skin 2 (two) times daily.  10/23/14   Gouru, Illene Silver, MD  insulin lispro (HUMALOG) 100 UNIT/ML injection Inject 1-12 Units into the skin 4 (four) times daily -  before meals and at bedtime. Sliding scale 07/11/16  07/11/17  [provider]  lidocaine (LIDODERM) 5 % Place 1 patch onto the skin daily as needed. For pain. Remove & Discard patch within 12 hours or as directed by MD    [provider]  lisinopril (PRINIVIL,ZESTRIL) 10 MG tablet Take 10 mg by mouth daily. 07/10/16 07/10/17  [provider]  metFORMIN (GLUCOPHAGE) 500 MG tablet Take 1 tablet (500 mg total) by mouth 2 (two) times daily with a meal. Patient taking differently: Take 500 mg by mouth daily.  10/23/14   Nicholes Mango, MD  naproxen (NAPROSYN) 500 MG tablet Take 1 tablet (500 mg total) by mouth 2 (two) times daily with a meal. Patient not taking: Reported on 08/22/2016 12/08/15   Carrie Mew, MD  nicotine (NICODERM CQ - DOSED IN MG/24 HOURS) 21 mg/24hr patch Place 1 patch (21 mg total) onto the skin daily. Patient not taking: Reported on 08/22/2016 10/23/14   Nicholes Mango, MD  ondansetron (ZOFRAN ODT) 4 MG disintegrating tablet Take 1 tablet (4 mg total) by mouth every 8 (eight) hours as needed for nausea or vomiting. 12/08/15   Carrie Mew, MD  oxyCODONE-acetaminophen (ROXICET) 5-325 MG tablet Take 1 tablet by mouth every 8 (eight) hours as needed for severe pain. Must last 10 days 09/01/16   Edrick Kins, DPM  oxyCODONE-acetaminophen (ROXICET) 5-325 MG tablet Take 1 tablet by mouth every 8 (eight) hours as needed for severe pain. 09/22/16   Edrick Kins, DPM  phenazopyridine (PYRIDIUM) 200 MG tablet Take 1 tablet (200 mg total) by mouth 3 (three) times daily as needed for pain. Patient not taking: Reported on 08/22/2016 12/08/15   Carrie Mew, MD  pneumococcal 23 valent vaccine (PNU-IMMUNE) 25 MCG/0.5ML injection Inject 0.5 mLs into the muscle tomorrow at 10 am. Patient not taking: Reported on 08/22/2016 10/23/14   Nicholes Mango, MD  silver sulfADIAZINE (SILVADENE) 1 % cream Apply 1 application topically daily. Patient not taking: Reported on 08/22/2016 12/26/15   Trula Slade, DPM  silver  sulfADIAZINE (SILVADENE) 1 % cream Apply 1 application topically daily. Patient not taking: Reported on 08/22/2016 01/13/16   Edrick Kins, DPM  tiotropium (SPIRIVA) 18 MCG inhalation capsule Place 1 capsule (18 mcg total) into inhaler and inhale daily. 10/23/14   Nicholes Mango, MD    Allergies Flexeril [cyclobenzaprine]; Hydrocodone; Other; Darvocet [propoxyphene n-acetaminophen]; Fish-derived products; Propofol; Toradol [ketorolac tromethamine]; Vicodin [hydrocodone-acetaminophen]; and Ketorolac  Family History  Problem Relation Age of Onset  . Lung cancer Mother     Social History Social History  Substance Use Topics  . Smoking status: Current Some Day Smoker    Packs/day: 0.50    Years: 30.00    Types: Cigarettes    Last attempt to quit: 10/05/2014  . Smokeless tobacco: Never Used  . Alcohol use No     Review of Systems  Constitutional: No fever/chills Musculoskeletal: Positive for toe pain. Neurological: Negative for tingling   ____________________________________________   PHYSICAL EXAM:  VITAL SIGNS: ED Triage Vitals  Enc Vitals Group     BP 09/26/16 2103 108/66     Pulse Rate 09/26/16 2103 82     Resp 09/26/16 2103 16     Temp 09/26/16 2103 98.7 F (37.1 C)     Temp Source 09/26/16 2103 Oral     SpO2 09/26/16 2103 97 %     Weight 09/26/16 2104 151 lb (68.5 kg)     Height 09/26/16 2104 5\' 2"  (1.575 m)     Head Circumference --      Peak Flow --      Pain Score 09/26/16 2103 8     Pain Loc --      Pain Edu? --      Excl. in New Home? --      Constitutional: Alert and oriented. Well appearing and in no acute distress. Eyes: Conjunctivae are normal. PERRL. EOMI. Head: Atraumatic. ENT:      Ears:      Nose: No congestion/rhinnorhea.      Mouth/Throat: Mucous membranes are moist.  Neck: No stridor.  Cardiovascular: Normal rate, regular rhythm.  Good peripheral circulation. Palpable dorsalis pedis pulses. Respiratory: Normal respiratory effort without  tachypnea or retractions. Lungs CTAB. Good air entry to the bases with no decreased or absent breath sounds. Musculoskeletal: Full range of motion to all extremities. No gross deformities appreciated. Neurologic:  Normal speech and language. No gross focal neurologic deficits are appreciated.  Skin:  Skin is warm, dry. Pulses are 2 plantar aspect of left great toe. No eschar. Granulation tissue with a red wound base. Minimal clear drainage. No exposed bone, muscle, tendon, ligament or joint. No malodor.    ____________________________________________   LABS (all labs ordered are listed, but only abnormal results are displayed)  Labs Reviewed  GLUCOSE, CAPILLARY - Abnormal; Notable for the following:       Result Value   Glucose-Capillary 392 (*)    All other components within normal limits   ____________________________________________  EKG   ____________________________________________  RADIOLOGY  No results found.  ____________________________________________    PROCEDURES  Procedure(s) performed:    Procedures    Medications - No data to display   ____________________________________________   INITIAL IMPRESSION / ASSESSMENT AND PLAN / ED COURSE  Pertinent labs & imaging results that were available during my care of the patient were reviewed by me and considered in my medical decision making (see chart for details).  Review of the [place] CSRS was performed in accordance of the [place] prior to dispensing any controlled drugs.   Patient presents to emergency department for evaluation of left great toe ulcer. Vital signs and exam are reassuring. Patient saw podiatry 4 days ago. She had an x-ray done and wound cultures 4 days ago. Patient's current concern is that she still has pain. She was given a 10 day supply of Percocet from podiatry. She is currently taking Augmentin. She will follow up with podiatry next week. Patient is given ED precautions to return to the ED  for any worsening or new symptoms.     ____________________________________________  FINAL CLINICAL IMPRESSION(S) / ED DIAGNOSES  Final diagnoses:  Skin ulcer of toe of left foot with fat layer exposed ([place])      NEW MEDICATIONS STARTED DURING THIS VISIT:  Discharge Medication List as of 09/26/2016 10:40 PM          This chart was dictated using voice recognition software/Dragon. Despite best efforts to proofread, errors can occur which can change the meaning. Any change  was purely unintentional.    Laban Emperor, PA-C 09/26/16 2354    Nena Polio, MD 09/27/16 [phone]

## 2016-09-26 NOTE — ED Triage Notes (Signed)
[facility] reporting pain in left big toe. Ulcer noted below toe on pad of foot. Ulcer is white in color and appears to be stage 2. Skin is red around. No drainage noted, no signs of infection. Pt denies having had fevers. No fevers noted at this time. Pt is hx of diabetes. Decreased sensation in all toes.

## 2016-09-29 NOTE — Progress Notes (Signed)
   Subjective:  46 year old female with a history of uncontrolled diabetes mellitus presents today for follow-up evaluation of an ulcer to the left great toe. Patient states that she is still experiencing a significant amount of pain and tenderness to the ulceration and toe site. She believes that the toe is worse in the wound is deeper with the pain excruciating. Patient is currently trying to be seen at a pain clinic. Her next appointment is 09/24/2016 at 12 PM.   Objective/Physical Exam General: The patient is alert and oriented x3 in no acute distress.  Dermatology:  Wound #1 noted to the plantar aspect of left great toe measuring a partially [phone] cm (LxWxD).   To the noted ulceration(s), there is no eschar. There is a moderate amount of slough, fibrin, and necrotic tissue noted. Granulation tissue and wound base is red. There is a minimal amount of serosanguineous drainage noted. There is no exposed bone muscle-tendon ligament or joint. There is no malodor. Periwound integrity is intact. Skin is warm, dry and supple bilateral lower extremities.  Vascular: Palpable pedal pulses bilaterally. No edema or erythema noted. Capillary refill within normal limits.  Neurological: Epicritic and protective threshold absent bilaterally.   Musculoskeletal Exam: Range of motion within normal limits to all pedal and ankle joints bilateral. Muscle strength 5/5 in all groups bilateral.   Assessment: #1 left great toe ulceration secondary to diabetes mellitus #2 diabetes mellitus w/ peripheral neuropathy   Plan of Care:  #1 Patient was evaluated. #2 medically necessary excisional debridement including muscle and deep fascial tissue was performed using a tissue nipper and a chisel blade. Excisional debridement of all the necrotic nonviable tissue down to healthy bleeding viable tissue was performed with post-debridement measurements same as pre-. #3 the wound was cleansed and dry sterile dressing  applied. #4 continue wearing a postoperative shoe.  #5 refill prescription for Percocet 5/325 mg #10. At this point we are no longer going to prescribe analgesic medication.  #6 today cultures were taken of the ulcer site  #7 prescription for Augmentin  #8 prescription for gabapentin 800 mg  #9 patient is to return to clinic in 3 weeks.   Edrick Kins, DPM [facility]  Dr. Edrick Kins, [address]                                        [place], [place] [address]                Office [phone]  Fax [phone]

## 2016-10-02 ENCOUNTER — Telehealth: Payer: Self-pay | Admitting: Podiatry

## 2016-10-02 NOTE — Telephone Encounter (Signed)
I attempted to contact patient and was unsuccessful, mailbox full.

## 2016-10-02 NOTE — Telephone Encounter (Signed)
I need to speak to the nurse or doctor on call. Dr. Amalia Hailey is my doctor and I need a refill of my pain medication. I'm in severe pain. Please call me back at [phone].

## 2016-10-02 NOTE — Telephone Encounter (Signed)
I spoke with patient and informed her that Dr. Amalia Hailey will not give any more pain medication due to her going to pain clinic.  She became very upset stating that she has alternating Ibuprofen and Tylenol q4hrs with no relief.  She stated that no one understands the pain she is going through and she wanted pain medication to help her with her pain.  I again apoligized and informed her that she can go to ER if pain becomes unbearable and continue ice, elevation, alternating Tylenol and Ibuprofen.  She was still very upset and said she would see [place] next Tues at her scheduled visit.  Patient has follow up appt with pain clinic on 10/06/16

## 2016-10-06 ENCOUNTER — Ambulatory Visit (INDEPENDENT_AMBULATORY_CARE_PROVIDER_SITE_OTHER): Payer: Medicaid Other | Admitting: Podiatry

## 2016-10-06 DIAGNOSIS — L97522 Non-pressure chronic ulcer of other part of left foot with fat layer exposed: Secondary | ICD-10-CM | POA: Diagnosis not present

## 2016-10-06 DIAGNOSIS — E0842 Diabetes mellitus due to underlying condition with diabetic polyneuropathy: Secondary | ICD-10-CM

## 2016-10-06 DIAGNOSIS — I70245 Atherosclerosis of native arteries of left leg with ulceration of other part of foot: Secondary | ICD-10-CM

## 2016-10-07 ENCOUNTER — Telehealth: Payer: Self-pay | Admitting: Podiatry

## 2016-10-07 NOTE — Telephone Encounter (Signed)
Patient called to see if Dr. Amalia Hailey can write her pain meds refill. She was here yesterday for her appt with Dr. Amalia Hailey so long that she missed her appointment at the pain clinic yesterday afternoon. They have never written any RX's for the patient. Per Sharyn Lull, she could not get another appointment with that Dr. At the pain clinic until 10/15/16. She advised that Dr. Amalia Hailey cut on her foot yesterday and she was in a lot of pain from that and she cannot wait until 10/15/16 to get something for pain.  Patient would like for someone to call her back today before 5:00 to advise when she can pick up the script. Per the pain clinic, they said that Dr. Amalia Hailey would have to write this refill until they get the patient in at the pain clinic for treatment.

## 2016-10-07 NOTE — Telephone Encounter (Signed)
Dr. Amalia Hailey, I spoke with Fraser Din, nurse at [facility] pain clinic,  They will not give pain medication at this time because she tested positive for cocaine on her first initial visit.  She is aware of this and on her 2nd visit on 09/24/16, they retested her urine and are currently awaiting the results.  Fraser Din said that they will give her the results of her urine at her next visit and if clear, then they will provide pain medication.  What would you like to do regarding her pain meds?  Please advise

## 2016-10-07 NOTE — Telephone Encounter (Signed)
Thanks for following up!!! No pain meds. She withheld that information from [place]. Thanks again Angie!

## 2016-10-08 NOTE — Telephone Encounter (Signed)
I spoke with Sharyn Lull after Dr. Amalia Hailey instructions and informed her that Dr. Amalia Hailey will not give her any more pain medication.  She was very upset and said have a good day and hung up

## 2016-10-12 NOTE — Progress Notes (Signed)
   Subjective:  46 year old female with a history of uncontrolled diabetes mellitus presents today for follow-up evaluation of an ulcer to the left great toe. Patient states that she is still experiencing a significant amount of pain and tenderness to the ulceration and toe site. Patient presents today in tears and is very distraught regarding the pain and situation of the ulceration site. She also states that she had an appointment today with pain management, however she canceled her appointment so she can make her appointment here today   Objective/Physical Exam General: The patient is alert and oriented x3 in no acute distress.  Dermatology:  Wound #1 noted to the plantar aspect of left great toe measuring a partially [phone] cm (LxWxD).   To the noted ulceration(s), there is no eschar. There is a moderate amount of slough, fibrin, and necrotic tissue noted. Granulation tissue and wound base is red. There is a minimal amount of serosanguineous drainage noted. There is no exposed bone muscle-tendon ligament or joint. There is no malodor. Periwound integrity is intact. Skin is warm, dry and supple bilateral lower extremities.  Vascular: Palpable pedal pulses bilaterally. No edema or erythema noted. Capillary refill within normal limits.  Neurological: Epicritic and protective threshold absent bilaterally.   Musculoskeletal Exam: Range of motion within normal limits to all pedal and ankle joints bilateral. Muscle strength 5/5 in all groups bilateral.   Assessment: #1 left great toe ulceration secondary to diabetes mellitus #2 diabetes mellitus w/ peripheral neuropathy   Plan of Care:  #1 Patient was evaluated. #2 medically necessary excisional debridement including muscle and deep fascial tissue was performed using a tissue nipper and a chisel blade. Excisional debridement of all the necrotic nonviable tissue down to healthy bleeding viable tissue was performed with post-debridement  measurements same as pre-. #3 the wound was cleansed and dry sterile dressing applied. Recommend daily application of Prisma and dry sterile dressing #4 continue wearing a postoperative shoe.  #5 the patient was very demanding today regarding pain medication. I informed the patient that since she is being seen for pain management via pain management physician I will no longer prescribe her any prescription analgesics. Patient states understanding. #6 return to clinic in 3 weeks  Edrick Kins, DPM [facility]  Dr. Edrick Kins, [address]                                        [place], [place] [address]                Office [phone]  Fax [phone]

## 2016-10-27 ENCOUNTER — Ambulatory Visit: Payer: Medicaid Other | Admitting: Podiatry

## 2016-10-30 ENCOUNTER — Ambulatory Visit (INDEPENDENT_AMBULATORY_CARE_PROVIDER_SITE_OTHER): Payer: Medicaid Other | Admitting: Podiatry

## 2016-10-30 VITALS — Temp 98.0°F

## 2016-10-30 DIAGNOSIS — L97522 Non-pressure chronic ulcer of other part of left foot with fat layer exposed: Secondary | ICD-10-CM | POA: Diagnosis not present

## 2016-10-30 DIAGNOSIS — E0842 Diabetes mellitus due to underlying condition with diabetic polyneuropathy: Secondary | ICD-10-CM

## 2016-10-30 DIAGNOSIS — I70245 Atherosclerosis of native arteries of left leg with ulceration of other part of foot: Secondary | ICD-10-CM

## 2016-10-30 NOTE — Progress Notes (Signed)
   Subjective: 46 year old female with a history of uncontrolled diabetes mellitus and substance abuse presents today for follow-up evaluation regarding an ulcer to the left great toe. She was recently admitted into the hospital for cellulitis and abscess secondary to a spider bite right hand. Incision and drainage was performed and patient was discharged. Patient currently taking Bactrim DS. Patient states she does not feel good and she thinks that possibly may be due to the antibiotic. Patient also states that she does have a postoperative shoe however she is not wearing today. Patient presents with sandals  Past Medical History:  Diagnosis Date  . Asthma   . Cancer ([place])    lung  . Cellulitis 04/15/2015  . COPD (chronic obstructive pulmonary disease) ([place])   . Diabetes mellitus    Poorly controlled with complications  . Emphysema   . Fibromyalgia   . Neuropathy   . Tenosynovitis of foot 04/16/2015      Objective/Physical Exam General: The patient is alert and oriented x3 in no acute distress.  Dermatology:  Wound #1 noted to the plantar aspect of the left great toe measuring [phone] cm (LxWxD).   To the noted ulceration(s), there is no eschar. There is a moderate amount of slough, fibrin, and necrotic tissue noted. Granulation tissue and wound base is red. There is a minimal amount of serosanguineous drainage noted. There is no exposed bone muscle-tendon ligament or joint. There is no malodor. Periwound integrity is intact. Skin is warm, dry and supple bilateral lower extremities.  Vascular: Palpable pedal pulses bilaterally. No edema or erythema noted. Capillary refill within normal limits.  Neurological: Epicritic and protective threshold absent bilaterally.   Musculoskeletal Exam: Range of motion within normal limits to all pedal and ankle joints bilateral. Muscle strength 5/5 in all groups bilateral.   Assessment: #1 left great toe ulceration secondary to diabetes  mellitus #2 diabetes mellitus w/ peripheral neuropathy   Plan of Care:  #1 Patient was evaluated. #2 medically necessary excisional debridement including muscle and deep fascial tissue was performed using a tissue nipper and a chisel blade. Excisional debridement of all the necrotic nonviable tissue down to healthy bleeding viable tissue was performed with post-debridement measurements same as pre-. #3 the wound was cleansed and dry sterile dressing applied. #4 offloading callus pads were dispensed for the patient to wear to the plantar aspect of the toe to offload pressure from the ulceration site. Recommend that the patient apply Betadine and dry sterile dressing daily at home. Dressing supplies were given to the patient today #5 the patient requested pain medication again today. She is no longer going to the pain management clinic. I told the patient can no longer prescribe her any opioid analgesics. #6 return to clinic in 2 weeks   Edrick Kins, DPM [facility]  Dr. Edrick Kins, [address]                                        [place], [place] [address]                Office [phone]  Fax [phone]

## 2016-11-13 ENCOUNTER — Ambulatory Visit (INDEPENDENT_AMBULATORY_CARE_PROVIDER_SITE_OTHER): Payer: Medicaid Other | Admitting: Podiatry

## 2016-11-13 ENCOUNTER — Ambulatory Visit (INDEPENDENT_AMBULATORY_CARE_PROVIDER_SITE_OTHER): Payer: Medicaid Other

## 2016-11-13 DIAGNOSIS — E0842 Diabetes mellitus due to underlying condition with diabetic polyneuropathy: Secondary | ICD-10-CM

## 2016-11-13 DIAGNOSIS — I70245 Atherosclerosis of native arteries of left leg with ulceration of other part of foot: Secondary | ICD-10-CM

## 2016-11-13 DIAGNOSIS — L97522 Non-pressure chronic ulcer of other part of left foot with fat layer exposed: Secondary | ICD-10-CM | POA: Diagnosis not present

## 2016-11-16 NOTE — Progress Notes (Signed)
   Subjective: 46 year old female with a history of uncontrolled diabetes mellitus and substance abuse presents today for follow-up evaluation regarding an ulcer to the left great toe. She states her pain is unchanged and currently rates it at 9/10. She reports an associated odor of the area. There are no modifying factors noted. She is here for further evaluation and treatment.   Past Medical History:  Diagnosis Date  . Asthma   . Cancer ([place])    lung  . Cellulitis 04/15/2015  . COPD (chronic obstructive pulmonary disease) ([place])   . Diabetes mellitus    Poorly controlled with complications  . Emphysema   . Fibromyalgia   . Neuropathy   . Tenosynovitis of foot 04/16/2015      Objective/Physical Exam General: The patient is alert and oriented x3 in no acute distress.  Dermatology:  Wound #1 noted to the plantar aspect of the left great toe measuring 0.6 x 0.6 x 1.0 cm (LxWxD).   To the noted ulceration(s), there is no eschar. There is a moderate amount of slough, fibrin, and necrotic tissue noted. Granulation tissue and wound base is red. There is a minimal amount of serosanguineous drainage noted. There is no exposed bone muscle-tendon ligament or joint. There is no malodor. Periwound integrity is intact. Skin is warm, dry and supple bilateral lower extremities.  Vascular: Palpable pedal pulses bilaterally. No edema or erythema noted. Capillary refill within normal limits.  Neurological: Epicritic and protective threshold absent bilaterally.   Musculoskeletal Exam: Range of motion within normal limits to all pedal and ankle joints bilateral. Muscle strength 5/5 in all groups bilateral.   Assessment: #1 left great toe ulceration secondary to diabetes mellitus #2 possible osteomyelitis left great toe   Plan of Care:  #1 Patient was evaluated. #2 medically necessary excisional debridement including muscle and deep fascial tissue was performed using a tissue nipper and a chisel  blade. Excisional debridement of all the necrotic nonviable tissue down to healthy bleeding viable tissue was performed with post-debridement measurements same as pre-. #3 the wound was cleansed and dry sterile dressing applied. #4 continue Betadine and dry sterile dressings daily. #5 patient is recently finish prescription for Bactrim for spider bite of the hand. Next line #6 MRI of the left forefoot ordered to rule out osteomyelitis #7 return to clinic in 3-4 weeks.   Edrick Kins, DPM [facility]  Dr. Edrick Kins, [address]                                        [place], [place] [address]                Office [phone]  Fax [phone]

## 2016-11-23 ENCOUNTER — Telehealth: Payer: Self-pay | Admitting: *Deleted

## 2016-11-23 DIAGNOSIS — E0842 Diabetes mellitus due to underlying condition with diabetic polyneuropathy: Secondary | ICD-10-CM

## 2016-11-23 DIAGNOSIS — L97522 Non-pressure chronic ulcer of other part of left foot with fat layer exposed: Secondary | ICD-10-CM

## 2016-11-23 DIAGNOSIS — Z01812 Encounter for preprocedural laboratory examination: Secondary | ICD-10-CM

## 2016-11-23 NOTE — Telephone Encounter (Signed)
Drucie Opitz states [employer] supervisor feels from reviewing clinicals would like to suggest pt would benefit from MRI non-joint lower extremity with and without contrast, would Dr. Amalia Hailey agree. Dr. Trinidad Curet alternate MRI [id number]. [place] PRIOR AUTHORIZATION:  [id number], VALID 11/19/2016 TO 12/19/2016. Faxed orders to [facility].

## 2016-11-25 ENCOUNTER — Ambulatory Visit: Payer: Medicaid Other | Admitting: Podiatry

## 2016-11-25 ENCOUNTER — Telehealth: Payer: Self-pay | Admitting: Podiatry

## 2016-11-25 ENCOUNTER — Telehealth: Payer: Self-pay | Admitting: *Deleted

## 2016-11-25 NOTE — Telephone Encounter (Signed)
Katie Guzman - [facility] states pt is on the phone and wants pain medication. I spoke with Dr. Amalia Hailey and he states he will not give pt pain medication, she had an amputation procedure by another doctor and they are to manage her pain medication. Routed message to A. Venable to contact pt.

## 2016-11-25 NOTE — Telephone Encounter (Signed)
Patient called and said that she had a partial amputation of her left foot on Monday, October 15, and yesterday Tuesday she was discharged from [facility] after the procedure and was sent home by a cab with a voucher for transportation. Patient was told that she could not come back to [facility] for treatment.  Patient states she needs a bandadge change because her foot is bleeding badly and she was discharged without any pain peds . Patient need RX for pain as soon as possible. Would like Dr. Amalia Hailey to call her back asap. Unable to keep her appointment today with Dr. Amalia Hailey because she had no transportation.

## 2016-11-26 NOTE — Telephone Encounter (Signed)
Returned patient call several times yesterday and phone never rang.  Called back today and left message for patient to return nurse call

## 2016-11-26 NOTE — Telephone Encounter (Signed)
I spoke with patient this morning, she was very upset and crying and started cursing me because she could not get any help.  I informed her that I was not going to be cursed at and talked to like that and she settled down. She stated that she had bleeding through her bandage and a lot of pain and she could not get any help.   I informed her that because she had surgery at [facility], she would have to contact the physician that performed her surgery and she would need to follow up with him.  She understood and would call [facility] for follow up

## 2016-12-04 ENCOUNTER — Encounter: Payer: Medicaid Other | Admitting: Podiatry

## 2016-12-15 NOTE — Progress Notes (Signed)
This encounter was created in error - please disregard.

## 2016-12-16 ENCOUNTER — Other Ambulatory Visit: Payer: Self-pay

## 2016-12-16 ENCOUNTER — Encounter: Payer: Self-pay | Admitting: Emergency Medicine

## 2016-12-16 ENCOUNTER — Emergency Department: Payer: Medicaid Other

## 2016-12-16 ENCOUNTER — Emergency Department
Admission: EM | Admit: 2016-12-16 | Discharge: 2016-12-16 | Disposition: A | Discharge status: Home / Self Care | Payer: Medicaid Other | Attending: Emergency Medicine | Admitting: Emergency Medicine

## 2016-12-16 DIAGNOSIS — J45909 Unspecified asthma, uncomplicated: Secondary | ICD-10-CM | POA: Diagnosis not present

## 2016-12-16 DIAGNOSIS — Z79899 Other long term (current) drug therapy: Secondary | ICD-10-CM | POA: Insufficient documentation

## 2016-12-16 DIAGNOSIS — E1140 Type 2 diabetes mellitus with diabetic neuropathy, unspecified: Secondary | ICD-10-CM | POA: Insufficient documentation

## 2016-12-16 DIAGNOSIS — M869 Osteomyelitis, unspecified: Secondary | ICD-10-CM | POA: Insufficient documentation

## 2016-12-16 DIAGNOSIS — Z85118 Personal history of other malignant neoplasm of bronchus and lung: Secondary | ICD-10-CM | POA: Insufficient documentation

## 2016-12-16 DIAGNOSIS — Z5189 Encounter for other specified aftercare: Secondary | ICD-10-CM | POA: Insufficient documentation

## 2016-12-16 DIAGNOSIS — G8918 Other acute postprocedural pain: Secondary | ICD-10-CM | POA: Diagnosis not present

## 2016-12-16 DIAGNOSIS — Z794 Long term (current) use of insulin: Secondary | ICD-10-CM | POA: Insufficient documentation

## 2016-12-16 DIAGNOSIS — E1169 Type 2 diabetes mellitus with other specified complication: Secondary | ICD-10-CM | POA: Insufficient documentation

## 2016-12-16 DIAGNOSIS — F17210 Nicotine dependence, cigarettes, uncomplicated: Secondary | ICD-10-CM | POA: Insufficient documentation

## 2016-12-16 DIAGNOSIS — Z89412 Acquired absence of left great toe: Secondary | ICD-10-CM | POA: Insufficient documentation

## 2016-12-16 LAB — URINALYSIS, COMPLETE (UACMP) WITH MICROSCOPIC
Bacteria, UA: NONE SEEN
Bilirubin Urine: NEGATIVE
HGB URINE DIPSTICK: NEGATIVE
KETONES UR: NEGATIVE mg/dL
LEUKOCYTES UA: NEGATIVE
NITRITE: NEGATIVE
PH: 6 (ref 5.0–8.0)
Protein, ur: NEGATIVE mg/dL
Specific Gravity, Urine: 1.032 — ABNORMAL HIGH (ref 1.005–1.030)

## 2016-12-16 LAB — CBC WITH DIFFERENTIAL/PLATELET
BASOS ABS: 0.1 10*3/uL (ref 0–0.1)
BASOS PCT: 1 %
EOS ABS: 0.3 10*3/uL (ref 0–0.7)
EOS PCT: 3 %
HCT: 46.1 % (ref 35.0–47.0)
HEMOGLOBIN: 15.5 g/dL (ref 12.0–16.0)
Lymphocytes Relative: 27 %
Lymphs Abs: 2.5 10*3/uL (ref 1.0–3.6)
MCH: 29.1 pg (ref 26.0–34.0)
MCHC: 33.6 g/dL (ref 32.0–36.0)
MCV: 86.6 fL (ref 80.0–100.0)
Monocytes Absolute: 0.7 10*3/uL (ref 0.2–0.9)
Monocytes Relative: 7 %
NEUTROS PCT: 62 %
Neutro Abs: 5.9 10*3/uL (ref 1.4–6.5)
PLATELETS: 316 10*3/uL (ref 150–440)
RBC: 5.32 MIL/uL — AB (ref 3.80–5.20)
RDW: 13.6 % (ref 11.5–14.5)
WBC: 9.4 10*3/uL (ref 3.6–11.0)

## 2016-12-16 LAB — COMPREHENSIVE METABOLIC PANEL
ALT: 12 U/L — AB (ref 14–54)
AST: 21 U/L (ref 15–41)
Albumin: 3.9 g/dL (ref 3.5–5.0)
Alkaline Phosphatase: 115 U/L (ref 38–126)
Anion gap: 10 (ref 5–15)
BILIRUBIN TOTAL: 0.9 mg/dL (ref 0.3–1.2)
BUN: 16 mg/dL (ref 6–20)
CALCIUM: 9.4 mg/dL (ref 8.9–10.3)
CO2: 24 mmol/L (ref 22–32)
CREATININE: 0.92 mg/dL (ref 0.44–1.00)
Chloride: 98 mmol/L — ABNORMAL LOW (ref 101–111)
Glucose, Bld: 348 mg/dL — ABNORMAL HIGH (ref 65–99)
Potassium: 4.3 mmol/L (ref 3.5–5.1)
Sodium: 132 mmol/L — ABNORMAL LOW (ref 135–145)
TOTAL PROTEIN: 7.5 g/dL (ref 6.5–8.1)

## 2016-12-16 LAB — LACTIC ACID, PLASMA: LACTIC ACID, VENOUS: 1.1 mmol/L (ref 0.5–1.9)

## 2016-12-16 MED ORDER — DOXYCYCLINE HYCLATE 100 MG PO CAPS: 100.0000 mg | ORAL_CAPSULE | Freq: Two times a day (BID) | ORAL | 0 refills | Status: DC

## 2016-12-16 MED ORDER — LEVOFLOXACIN 500 MG PO TABS: 500.0000 mg | ORAL_TABLET | Freq: Every day | ORAL | 0 refills | Status: AC

## 2016-12-16 NOTE — ED Notes (Signed)
Pt's food wrapped. Pt very agitated and stated she was in pain and wasn't being taken serious. This RN helped to reiterate what MD explained to pt. PT states "I'm getting really aggravated." Asked pt how I could further help her and pt denies any other needs.

## 2016-12-16 NOTE — ED Notes (Signed)
Pt very aggravated stating she needs to "step outside for some fresh air to cool down or she is going flip out." Pt states she is going to come back in when she calms down. Charge RN made aware. Pt walked out of room with family.

## 2016-12-16 NOTE — ED Notes (Signed)
Pt back in room. MD and Charge RN updated

## 2016-12-16 NOTE — ED Notes (Signed)
MD at bedside. 

## 2016-12-16 NOTE — Discharge Instructions (Signed)
You have been seen today in the Emergency Department (ED) for evaluation of a possible left toe infection. Please take your antibiotics as prescribed for their ENTIRE prescribed duration.    Please follow up with your doctor [facility]) in 24-48 hours for recheck of your infection if you are not improving.  Call your doctor sooner or return to the ED if you develop worsening signs of infection such as: increased redness, increased pain, pus, fever, or other symptoms that concern you.

## 2016-12-16 NOTE — ED Triage Notes (Addendum)
Patient presents to the ED with fever for the past two days, extreme foot pain and green drainage coming from toe wound.  Patient states her great toe was amputated approx. 2 weeks ago.  Patient states she had a follow up with surgery approx. 1.5 weeks ago but hasn't been back since.  Patient states wound care is currently closed due to the water issue.  Patient states her fever at home 2 days ago was 101.7 and her temp yesterday was over 100.

## 2016-12-16 NOTE — ED Provider Notes (Signed)
[facility] Emergency Department Provider Note   ____________________________________________   First MD Initiated Contact with Patient 12/16/16 1915     (approximate)  I have reviewed the triage vital signs and the nursing notes.   HISTORY  Chief Complaint Post-op Problem    HPI Katie Guzman is a 46 y.o. female here for evaluation of pain and drainage from her left great toe incision  Patient reports that she had a amputation of the left great toe at [facility] during early October.  At that time she was treated for osteomyelitis in the toe and it had to be removed.  Patient reports that for about the last 3 days she has noticed a small amount of what looks like slightly yellowish drainage coming from the surgical wound.  She has been feeling as though she is been running a temperature and reports a temperature of 101 at home yesterday.  She reports increasing pain just at the area of amputation.  She has not seen any redness or swelling in the area but has been providing daily dressing wraps to it.  No nausea or vomiting.  No fevers.  No chest pain or shortness of breath.  No headaches.  No chills.  No pain anywhere else in the foot or legs.  Patient does want me to know that she would like a prescription for Percocet as well she is going to have to go home today.  She reports she was treated with Percocet by vascular surgery at [facility] but ran out.  Past Medical History:  Diagnosis Date  . Asthma   . Cancer ([place])    lung  . Cellulitis 04/15/2015  . COPD (chronic obstructive pulmonary disease) ([place])   . Diabetes mellitus    Poorly controlled with complications  . Emphysema   . Fibromyalgia   . Neuropathy   . Tenosynovitis of foot 04/16/2015    Patient Active Problem List   Diagnosis Date Noted  . Skin ulcer ([place]) 08/23/2016  . Diabetic foot ulcer ([place]) 08/23/2016  . Diabetic ulcer of toe of left foot associated with type 2 diabetes mellitus ([place])    . 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 Tenosynovitis of foot 04/16/2015  . Hypokalemia 04/16/2015  . Cellulitis 04/15/2015  . Diabetes mellitus ([place]) 04/15/2015  . Pain syndrome, chronic 04/15/2015  . COPD (chronic obstructive pulmonary disease) ([place]) 04/15/2015  . Diabetic neuropathy ([place]) 04/15/2015  . Diabetes mellitus with complication ([place])   . COPD exacerbation ([place]) 10/19/2014    Past Surgical History:  Procedure Laterality Date  . BACK SURGERY    . CESAREAN SECTION    . TONSILLECTOMY    . TUBAL LIGATION      Prior to Admission medications   Medication Sig Start Date End Date Taking? Authorizing Provider  acetaminophen (TYLENOL) 325 MG tablet Take 650 mg by mouth every 6 (six) hours as needed for mild pain.    [provider]  albuterol (PROVENTIL HFA;VENTOLIN HFA) 108 (90 BASE) MCG/ACT inhaler Inhale 2 puffs into the lungs every 4 (four) hours as needed for wheezing or shortness of breath. For wheeze or shortness of breath 10/23/14   Gouru, Illene Silver, MD  albuterol (PROVENTIL) (2.5 MG/3ML) 0.083% nebulizer solution Take 2.5 mg by nebulization every 4 (four) hours as needed for wheezing or shortness of breath.     [provider]  amoxicillin-clavulanate (AUGMENTIN) 875-125 MG tablet Take 1 tablet by mouth 2 (two) times daily. 09/22/16  Edrick Kins, DPM  doxycycline (VIBRAMYCIN) 100 MG capsule Take 1 capsule (100 mg total) 2 (two) times daily by mouth. 12/16/16   Delman Kitten, MD  fluticasone-salmeterol (ADVAIR HFA) 811-91 MCG/ACT inhaler Inhale 2 puffs into the lungs 2 (two) times daily. 10/23/14   Nicholes Mango, MD  gabapentin (NEURONTIN) 800 MG tablet Take 1 tablet (800 mg total) by mouth 3 (three) times daily. 09/22/16   Edrick Kins, DPM  gentamicin cream (GARAMYCIN) 0.1 % Apply 1 application topically 3 (three) times daily. 08/18/16   Edrick Kins, DPM  guaiFENesin-dextromethorphan (ROBITUSSIN DM) 100-10 MG/5ML syrup Take 10 mLs  by mouth every 6 (six) hours as needed for cough. Patient not taking: Reported on 08/22/2016 10/23/14   Nicholes Mango, MD  ibuprofen (ADVIL,MOTRIN) 600 MG tablet Take 600 mg by mouth every 6 (six) hours as needed for fever or mild pain.    [provider]  Influenza vac split quadrivalent PF (FLUARIX) 0.5 ML injection Inject 0.5 mLs into the muscle tomorrow at 10 am. Patient not taking: Reported on 08/22/2016 10/23/14   Nicholes Mango, MD  insulin aspart (NOVOLOG) 100 UNIT/ML injection Inject 10 Units into the skin 3 (three) times daily with meals. 10/23/14   Gouru, Illene Silver, MD  insulin glargine (LANTUS) 100 UNIT/ML injection Inject 0.25 mLs (25 Units total) into the skin 2 (two) times daily. Patient taking differently: Inject 28 Units into the skin 2 (two) times daily.  10/23/14   Gouru, Illene Silver, MD  insulin lispro (HUMALOG) 100 UNIT/ML injection Inject 1-12 Units into the skin 4 (four) times daily -  before meals and at bedtime. Sliding scale 07/11/16 07/11/17  [provider]  levofloxacin (LEVAQUIN) 500 MG tablet Take 1 tablet (500 mg total) daily for 10 days by mouth. 12/16/16 12/26/16  Delman Kitten, MD  lidocaine (LIDODERM) 5 % Place 1 patch onto the skin daily as needed. For pain. Remove & Discard patch within 12 hours or as directed by MD    [provider]  lisinopril (PRINIVIL,ZESTRIL) 10 MG tablet Take 10 mg by mouth daily. 07/10/16 07/10/17  [provider]  metFORMIN (GLUCOPHAGE) 500 MG tablet Take 1 tablet (500 mg total) by mouth 2 (two) times daily with a meal. Patient taking differently: Take 500 mg by mouth daily.  10/23/14   Nicholes Mango, MD  naproxen (NAPROSYN) 500 MG tablet Take 1 tablet (500 mg total) by mouth 2 (two) times daily with a meal. Patient not taking: Reported on 08/22/2016 12/08/15   Carrie Mew, MD  nicotine (NICODERM CQ - DOSED IN MG/24 HOURS) 21 mg/24hr patch Place 1 patch (21 mg total) onto the skin daily. Patient not taking: Reported on  08/22/2016 10/23/14   Nicholes Mango, MD  ondansetron (ZOFRAN ODT) 4 MG disintegrating tablet Take 1 tablet (4 mg total) by mouth every 8 (eight) hours as needed for nausea or vomiting. 12/08/15   Carrie Mew, MD  oxyCODONE-acetaminophen (ROXICET) 5-325 MG tablet Take 1 tablet by mouth every 8 (eight) hours as needed for severe pain. Must last 10 days 09/01/16   Edrick Kins, DPM  oxyCODONE-acetaminophen (ROXICET) 5-325 MG tablet Take 1 tablet by mouth every 8 (eight) hours as needed for severe pain. 09/22/16   Edrick Kins, DPM  phenazopyridine (PYRIDIUM) 200 MG tablet Take 1 tablet (200 mg total) by mouth 3 (three) times daily as needed for pain. Patient not taking: Reported on 08/22/2016 12/08/15   Carrie Mew, MD  pneumococcal 23 valent vaccine (PNU-IMMUNE) 25 MCG/0.5ML  injection Inject 0.5 mLs into the muscle tomorrow at 10 am. Patient not taking: Reported on 08/22/2016 10/23/14   Nicholes Mango, MD  silver sulfADIAZINE (SILVADENE) 1 % cream Apply 1 application topically daily. Patient not taking: Reported on 08/22/2016 12/26/15   Trula Slade, DPM  silver sulfADIAZINE (SILVADENE) 1 % cream Apply 1 application topically daily. Patient not taking: Reported on 08/22/2016 01/13/16   Edrick Kins, DPM  tiotropium (SPIRIVA) 18 MCG inhalation capsule Place 1 capsule (18 mcg total) into inhaler and inhale daily. 10/23/14   Nicholes Mango, MD    Allergies Flexeril [cyclobenzaprine]; Hydrocodone; Other; Darvocet [propoxyphene n-acetaminophen]; Fish-derived products; Propofol; Toradol [ketorolac tromethamine]; Vicodin [hydrocodone-acetaminophen]; Ketorolac; and Tramadol  Family History  Problem Relation Age of Onset  . Lung cancer Mother     Social History Social History   Tobacco Use  . Smoking status: Current Some Day Smoker    Packs/day: 0.50    Years: 30.00    Pack years: 15.00    Types: Cigarettes    Last attempt to quit: 10/05/2014    Years since quitting: 2.2  . Smokeless  tobacco: Never Used  Substance Use Topics  . Alcohol use: No  . Drug use: No    Review of Systems Constitutional: No chills or weakness Eyes: No visual changes. ENT: No sore throat. Cardiovascular: Denies chest pain. Respiratory: Denies shortness of breath. Gastrointestinal: No abdominal pain.  No nausea, no vomiting.  No diarrhea.  No constipation. Genitourinary: Negative for dysuria.  Denies pregnancy. Musculoskeletal: Negative for back pain.  See HPI Skin: Negative for rash. Neurological: Negative for headaches, focal weakness or numbness.    ____________________________________________   PHYSICAL EXAM:  VITAL SIGNS: ED Triage Vitals  Enc Vitals Group     BP 12/16/16 1522 140/83     Pulse Rate 12/16/16 1522 86     Resp 12/16/16 1522 18     Temp 12/16/16 1522 98.1 F (36.7 C)     Temp Source 12/16/16 1522 Oral     SpO2 12/16/16 1522 98 %     Weight 12/16/16 1522 145 lb (65.8 kg)     Height 12/16/16 1522 5\' 2"  (1.575 m)     Head Circumference --      Peak Flow --      Pain Score 12/16/16 1520 8     Pain Loc --      Pain Edu? --      Excl. in [place]? --     Constitutional: Alert and oriented. Well appearing and in no acute distress.  Patient temp rechecked, afebrile 98.4 oral on recheck. Eyes: Conjunctivae are normal. Head: Atraumatic. Nose: No congestion/rhinnorhea. Mouth/Throat: Mucous membranes are moist. Neck: No stridor.   Cardiovascular: Normal rate, regular rhythm. Grossly normal heart sounds.  Good peripheral circulation. Respiratory: Normal respiratory effort.  No retractions. Lungs CTAB. Gastrointestinal: Soft and nontender. No distention. Musculoskeletal:  Lower Extremities  No edema. Normal DP/PT pulses bilateral with good cap refill.  Normal neuro-motor function lower extremities bilateral.  RIGHT Right lower extremity demonstrates normal strength, good use of all muscles. No edema bruising or contusions of the right hip, right knee, right  ankle. Full range of motion of the right lower extremity without pain.    LEFT Left lower extremity demonstrates normal strength, good use of all muscles. No edema bruising or contusions of the hip,  knee, ankle. Full range of motion of the left lower extremity without pain except she reports attempts to wiggle the toes causes  pain at the right great toe amputation area..  She has intact and palpable dorsalis pedis and posterior tibial pulses with normal capillary refill.  If there is notable excision of the right great toe, no purulence is expressible no significant surrounding erythema or drainage is noted at this time, though there is some erythema of the wound edges which I suspect is likely part of healing at this time.  No overt signs of infection are noted by my exam.  Please note I did upload a clinical picture of this.  Granulation tissue is present, she does have some sutures remaining, and an area that I am unclear if it is slightly dehisced or if this area was left open after surgery.    Neurologic:  Normal speech and language. No gross focal neurologic deficits are appreciated.  Skin:  Skin is warm, dry and intact. No rash noted. Psychiatric: Mood and affect are normal. Speech and behavior are normal.  ____________________________________________   LABS (all labs ordered are listed, but only abnormal results are displayed)  Labs Reviewed  COMPREHENSIVE METABOLIC PANEL - Abnormal; Notable for the following components:      Result Value   Sodium 132 (*)    Chloride 98 (*)    Glucose, Bld 348 (*)    ALT 12 (*)    All other components within normal limits  CBC WITH DIFFERENTIAL/PLATELET - Abnormal; Notable for the following components:   RBC 5.32 (*)    All other components within normal limits  URINALYSIS, COMPLETE (UACMP) WITH MICROSCOPIC - Abnormal; Notable for the following components:   Color, Urine YELLOW (*)    APPearance CLEAR (*)    Specific Gravity, Urine 1.032 (*)      Glucose, UA >=500 (*)    Squamous Epithelial / LPF 0-5 (*)    All other components within normal limits  LACTIC ACID, PLASMA   ____________________________________________  EKG   ____________________________________________  RADIOLOGY  Dg Foot Complete Left  Result Date: 12/16/2016 CLINICAL DATA:  Severe pain and green drainage from great toe amputation site which was performed 2 weeks ago. EXAM: LEFT FOOT - COMPLETE 3+ VIEW COMPARISON:  11/13/2016 FINDINGS: Transmetatarsal amputation of the great toe noted. No definite radiographic evidence of acute osteomyelitis noted. No acute fracture, subluxation or dislocation identified. IMPRESSION: Transmetatarsal amputation of the great toe without definite radiographic evidence of acute osteomyelitis. Consider MRI if there is strong clinical suspicion. Electronically Signed   By: Margarette Canada M.D.   On: 12/16/2016 19:55    Radiology results reviewed, no clear evidence of acute osteomyelitis ____________________________________________   PROCEDURES  Procedure(s) performed: None  Procedures  Critical Care performed: No  ____________________________________________   INITIAL IMPRESSION / ASSESSMENT AND PLAN / ED COURSE  Pertinent labs & imaging results that were available during my care of the patient were reviewed by me and considered in my medical decision making (see chart for details).  Patient for evaluation of left great toe amputation site, reporting fevers at home.  Her wound appears fairly clean, I do not see an overt evidence of infection and am not able to express any drainage from the wound at this time.  However, clearly she is been treated for osteomyelitis in this foot.  She is currently off antibiotics and is reporting subjective fevers at home.  She also reports increased pain in the area and is requesting Percocet prescription.  At this point, I will obtain an x-ray and then thereafter plan to discuss her case and  care with her vascular surgery team at [facility] for further input and care recommendations.   Reviewed records especially those for care everywhere regarding the patient's recent care to [facility]    ----------------------------------------- 8:46 PM on 12/16/2016 ----------------------------------------- Reviewed case and exam with Dr. Nevada Crane on call for [facility] vascular surgery.  He recommends that if placing on antibiotics, placing the patient on doxycycline and Levaquin based on the day that he has [facility] would be reasonable.  I will start the patient on doxycycline and Levaquin, although it is somewhat unclear if a true infection does exist at this time.  However, I think this can certainly be used prophylactically and given her concerns of possible drainage which we have not seen I will initiate antibiotics.  I recommended to her that she follow-up with vascular surgery at [facility], Dr. Nevada Crane recommended this that the patient call and set this up.  In addition, we discussed pain control, Dr. Nevada Crane advises that they recommend that the primary doctor or pain clinic be her prescriber with regard to pain medication.  Return precautions and treatment recommendations and follow-up discussed with the patient who is agreeable with the plan.    ____________________________________________   FINAL CLINICAL IMPRESSION(S) / ED DIAGNOSES  Final diagnoses:  Visit for wound check  Post-op pain      NEW MEDICATIONS STARTED DURING THIS VISIT:  This SmartLink is deprecated. Use AVSMEDLIST instead to display the medication list for a patient.   Note:  This document was prepared using Dragon voice recognition software and may include unintentional dictation errors.     Delman Kitten, MD 12/17/16 [phone]

## 2017-02-09 DIAGNOSIS — C801 Malignant (primary) neoplasm, unspecified: Secondary | ICD-10-CM

## 2017-02-09 HISTORY — DX: Malignant (primary) neoplasm, unspecified: C80.1

## 2017-03-05 ENCOUNTER — Telehealth: Payer: Self-pay | Admitting: Podiatry

## 2017-03-05 NOTE — Telephone Encounter (Signed)
No. Not okay to state that. Her problem is solely due to uncontrolled diabetes. Thanks, Dr. Amalia Hailey

## 2017-03-27 ENCOUNTER — Encounter (HOSPITAL_COMMUNITY): Payer: Self-pay | Admitting: *Deleted

## 2017-03-27 ENCOUNTER — Other Ambulatory Visit: Payer: Self-pay

## 2017-03-27 ENCOUNTER — Emergency Department (HOSPITAL_COMMUNITY): Payer: Medicaid Other

## 2017-03-27 ENCOUNTER — Inpatient Hospital Stay (HOSPITAL_COMMUNITY)
Admission: EM | Admit: 2017-03-27 | Discharge: 2017-03-28 | DRG: 638 | Discharge status: Against Medical Advice | Payer: Medicaid Other | Attending: Internal Medicine | Admitting: Internal Medicine

## 2017-03-27 DIAGNOSIS — L089 Local infection of the skin and subcutaneous tissue, unspecified: Secondary | ICD-10-CM | POA: Diagnosis not present

## 2017-03-27 DIAGNOSIS — J449 Chronic obstructive pulmonary disease, unspecified: Secondary | ICD-10-CM | POA: Diagnosis present

## 2017-03-27 DIAGNOSIS — Z91013 Allergy to seafood: Secondary | ICD-10-CM | POA: Diagnosis not present

## 2017-03-27 DIAGNOSIS — E1165 Type 2 diabetes mellitus with hyperglycemia: Secondary | ICD-10-CM | POA: Diagnosis present

## 2017-03-27 DIAGNOSIS — L97519 Non-pressure chronic ulcer of other part of right foot with unspecified severity: Secondary | ICD-10-CM | POA: Diagnosis present

## 2017-03-27 DIAGNOSIS — Z888 Allergy status to other drugs, medicaments and biological substances status: Secondary | ICD-10-CM | POA: Diagnosis not present

## 2017-03-27 DIAGNOSIS — L0390 Cellulitis, unspecified: Secondary | ICD-10-CM | POA: Diagnosis present

## 2017-03-27 DIAGNOSIS — Z794 Long term (current) use of insulin: Secondary | ICD-10-CM

## 2017-03-27 DIAGNOSIS — L03116 Cellulitis of left lower limb: Secondary | ICD-10-CM | POA: Diagnosis present

## 2017-03-27 DIAGNOSIS — M868X7 Other osteomyelitis, ankle and foot: Secondary | ICD-10-CM | POA: Diagnosis present

## 2017-03-27 DIAGNOSIS — Z5321 Procedure and treatment not carried out due to patient leaving prior to being seen by health care provider: Secondary | ICD-10-CM | POA: Diagnosis not present

## 2017-03-27 DIAGNOSIS — E11621 Type 2 diabetes mellitus with foot ulcer: Secondary | ICD-10-CM | POA: Diagnosis present

## 2017-03-27 DIAGNOSIS — Z89412 Acquired absence of left great toe: Secondary | ICD-10-CM | POA: Diagnosis not present

## 2017-03-27 DIAGNOSIS — E1140 Type 2 diabetes mellitus with diabetic neuropathy, unspecified: Secondary | ICD-10-CM | POA: Diagnosis present

## 2017-03-27 DIAGNOSIS — F1910 Other psychoactive substance abuse, uncomplicated: Secondary | ICD-10-CM | POA: Diagnosis present

## 2017-03-27 DIAGNOSIS — M797 Fibromyalgia: Secondary | ICD-10-CM | POA: Diagnosis present

## 2017-03-27 DIAGNOSIS — Z765 Malingerer [conscious simulation]: Secondary | ICD-10-CM | POA: Diagnosis not present

## 2017-03-27 DIAGNOSIS — Z79899 Other long term (current) drug therapy: Secondary | ICD-10-CM

## 2017-03-27 DIAGNOSIS — E871 Hypo-osmolality and hyponatremia: Secondary | ICD-10-CM | POA: Diagnosis present

## 2017-03-27 DIAGNOSIS — E11628 Type 2 diabetes mellitus with other skin complications: Secondary | ICD-10-CM | POA: Diagnosis present

## 2017-03-27 DIAGNOSIS — E1169 Type 2 diabetes mellitus with other specified complication: Principal | ICD-10-CM | POA: Diagnosis present

## 2017-03-27 DIAGNOSIS — L97509 Non-pressure chronic ulcer of other part of unspecified foot with unspecified severity: Secondary | ICD-10-CM

## 2017-03-27 DIAGNOSIS — F17210 Nicotine dependence, cigarettes, uncomplicated: Secondary | ICD-10-CM | POA: Diagnosis present

## 2017-03-27 DIAGNOSIS — Z885 Allergy status to narcotic agent status: Secondary | ICD-10-CM

## 2017-03-27 DIAGNOSIS — E119 Type 2 diabetes mellitus without complications: Secondary | ICD-10-CM

## 2017-03-27 DIAGNOSIS — M869 Osteomyelitis, unspecified: Secondary | ICD-10-CM

## 2017-03-27 LAB — COMPREHENSIVE METABOLIC PANEL
ALBUMIN: 3.7 g/dL (ref 3.5–5.0)
ALT: 14 U/L (ref 14–54)
AST: 20 U/L (ref 15–41)
Alkaline Phosphatase: 134 U/L — ABNORMAL HIGH (ref 38–126)
Anion gap: 12 (ref 5–15)
BUN: 15 mg/dL (ref 6–20)
CO2: 22 mmol/L (ref 22–32)
Calcium: 9.1 mg/dL (ref 8.9–10.3)
Chloride: 97 mmol/L — ABNORMAL LOW (ref 101–111)
Creatinine, Ser: 0.9 mg/dL (ref 0.44–1.00)
GFR calc Af Amer: >60 mL/min (ref >60)
GFR calc non Af Amer: >60 mL/min (ref >60)
GLUCOSE: 464 mg/dL — AB (ref 65–99)
POTASSIUM: 4.1 mmol/L (ref 3.5–5.1)
SODIUM: 131 mmol/L — AB (ref 135–145)
Total Bilirubin: 0.6 mg/dL (ref 0.3–1.2)
Total Protein: 6.8 g/dL (ref 6.5–8.1)

## 2017-03-27 LAB — CBC WITH DIFFERENTIAL/PLATELET
Basophils Absolute: 0.1 10*3/uL (ref 0.0–0.1)
Basophils Relative: 1 %
Eosinophils Absolute: 0.2 10*3/uL (ref 0.0–0.7)
Eosinophils Relative: 2 %
HEMATOCRIT: 44.4 % (ref 36.0–46.0)
Hemoglobin: 14.8 g/dL (ref 12.0–15.0)
LYMPHS ABS: 2 10*3/uL (ref 0.7–4.0)
LYMPHS PCT: 22 %
MCH: 29.1 pg (ref 26.0–34.0)
MCHC: 33.3 g/dL (ref 30.0–36.0)
MCV: 87.4 fL (ref 78.0–100.0)
MONO ABS: 0.5 10*3/uL (ref 0.1–1.0)
MONOS PCT: 6 %
NEUTROS ABS: 6.6 10*3/uL (ref 1.7–7.7)
Neutrophils Relative %: 69 %
Platelets: 300 10*3/uL (ref 150–400)
RBC: 5.08 MIL/uL (ref 3.87–5.11)
RDW: 13.3 % (ref 11.5–15.5)
WBC: 9.3 10*3/uL (ref 4.0–10.5)

## 2017-03-27 LAB — I-STAT CG4 LACTIC ACID, ED
LACTIC ACID, VENOUS: 2.73 mmol/L — AB (ref 0.5–1.9)
Lactic Acid, Venous: 2.22 mmol/L (ref 0.5–1.9)

## 2017-03-27 LAB — I-STAT BETA HCG BLOOD, ED (MC, WL, AP ONLY)

## 2017-03-27 MED ORDER — PIPERACILLIN-TAZOBACTAM 3.375 G IVPB
3.3750 g | Freq: Three times a day (TID) | INTRAVENOUS | Status: DC
  Administered 2017-03-28 (×2): 3.375 g via INTRAVENOUS
  Filled 2017-03-27 (×3): qty 50

## 2017-03-27 MED ORDER — SODIUM CHLORIDE 0.9 % IV BOLUS (SEPSIS)
1000.0000 mL | Freq: Once | INTRAVENOUS | Status: AC
  Administered 2017-03-28: 1000 mL via INTRAVENOUS

## 2017-03-27 MED ORDER — GABAPENTIN 400 MG PO CAPS
800.0000 mg | ORAL_CAPSULE | Freq: Three times a day (TID) | ORAL | Status: DC
  Administered 2017-03-28 (×2): 800 mg via ORAL
  Filled 2017-03-27 (×2): qty 2

## 2017-03-27 MED ORDER — VANCOMYCIN HCL IN DEXTROSE 750-5 MG/150ML-% IV SOLN
750.0000 mg | Freq: Three times a day (TID) | INTRAVENOUS | Status: DC
  Administered 2017-03-28 (×2): 750 mg via INTRAVENOUS
  Filled 2017-03-27 (×3): qty 150

## 2017-03-27 MED ORDER — NICOTINE 21 MG/24HR TD PT24
21.0000 mg | MEDICATED_PATCH | Freq: Every day | TRANSDERMAL | Status: DC
  Administered 2017-03-28 (×2): 21 mg via TRANSDERMAL
  Filled 2017-03-27 (×2): qty 1

## 2017-03-27 MED ORDER — SODIUM CHLORIDE 0.9 % IV SOLN
INTRAVENOUS | Status: DC
  Administered 2017-03-28: 1000 mL via INTRAVENOUS

## 2017-03-27 NOTE — ED Notes (Signed)
Called pt to draw blood for repeat lactic acid,  But pt did not answer.

## 2017-03-27 NOTE — ED Notes (Signed)
RN attempted to start patient IV and obtain Cultures; pt states she wants to go outside to get some belongings before IV is started; RN asked pt if family could bring belongings in?; Pt states "It's not different from when I get ready to go outside to smoke"; pt was informed by RN that this is a smoke free campus and the only way to smoke is to go across the street, however she will be unable to go outside to smoke while in ED; pt was offered Nicotine patch; Pt got upset and states she will go home if not allowed to smoke; EDP notified and went reiterate to patient that she will place order for nicotine patch but she could not exit building to smoke-Monique,RN

## 2017-03-27 NOTE — ED Notes (Signed)
NF Garlon Hatchet and Dr Zenia Resides informed of lactic acid results 2.22

## 2017-03-27 NOTE — ED Notes (Signed)
Called x 2 NO answer 

## 2017-03-27 NOTE — ED Provider Notes (Signed)
[facility] EMERGENCY DEPARTMENT Provider Note   CSN: [id number] Arrival date & time: 03/27/17  1547     History   Chief Complaint Chief Complaint  Patient presents with  . Fever  . Foot Ulcer    HPI Katie Guzman is a 47 y.o. female with PMH/o DM, Fibromyalgia who presents for evaluation of persistent left foot pain.  Patient reports that she has been dealing with ongoing foot pain for several months.  She has been evaluated by multiple doctors for diabetic foot ulcer.  She reports that wound developed over the last several months.  Patient was recently seen at [facility] for evaluation of foot ulcer.  At that time, she had an MRI that showed osteomyelitis.  Patient left AMA and did not receive treatment.  Patient reports that since discharge, she has had worsening pain.  She states that she is still able to walk on it but states that the pain has spread up her left lower extremity.  She states that she has been having fevers T-max of 103.  She has been taking Tylenol and ibuprofen for fever relief.  Patient denies any chest pain, difficulty breathing, abdominal pain, nausea/vomiting.  The history is provided by the patient.    Past Medical History:  Diagnosis Date  . Asthma   . Cancer ([place])    lung  . Cellulitis 04/15/2015  . COPD (chronic obstructive pulmonary disease) ([place])   . Diabetes mellitus    Poorly controlled with complications  . Emphysema   . Fibromyalgia   . Neuropathy   . Tenosynovitis of foot 04/16/2015    Patient Active Problem List   Diagnosis Date Noted  . Skin ulcer ([place]) 08/23/2016  . Diabetic foot ulcer ([place]) 08/23/2016  . Diabetic ulcer of toe of left foot associated with type 2 diabetes mellitus ([place])   . 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 Tenosynovitis of foot 04/16/2015  . Hypokalemia 04/16/2015  . Cellulitis 04/15/2015  . Diabetes mellitus ([place]) 04/15/2015  . Pain syndrome, chronic 04/15/2015  .  COPD (chronic obstructive pulmonary disease) ([place]) 04/15/2015  . Diabetic neuropathy ([place]) 04/15/2015  . Diabetes mellitus with complication ([place])   . COPD exacerbation ([place]) 10/19/2014    Past Surgical History:  Procedure Laterality Date  . BACK SURGERY    . CESAREAN SECTION    . TONSILLECTOMY    . TUBAL LIGATION      OB History    No data available       Home Medications    Prior to Admission medications   Medication Sig Start Date End Date Taking? Authorizing Provider  acetaminophen (TYLENOL) 325 MG tablet Take 650 mg by mouth every 6 (six) hours as needed for mild pain.    [provider]  albuterol (PROVENTIL HFA;VENTOLIN HFA) 108 (90 BASE) MCG/ACT inhaler Inhale 2 puffs into the lungs every 4 (four) hours as needed for wheezing or shortness of breath. For wheeze or shortness of breath 10/23/14   Gouru, Illene Silver, MD  albuterol (PROVENTIL) (2.5 MG/3ML) 0.083% nebulizer solution Take 2.5 mg by nebulization every 4 (four) hours as needed for wheezing or shortness of breath.     [provider]  amoxicillin-clavulanate (AUGMENTIN) 875-125 MG tablet Take 1 tablet by mouth 2 (two) times daily. 09/22/16   Edrick Kins, DPM  doxycycline (VIBRAMYCIN) 100 MG capsule Take 1 capsule (100 mg total) 2 (two) times daily by mouth. 12/16/16   Delman Kitten, MD  fluticasone-salmeterol (  ADVAIR HFA) 115-21 MCG/ACT inhaler Inhale 2 puffs into the lungs 2 (two) times daily. 10/23/14   Nicholes Mango, MD  gabapentin (NEURONTIN) 800 MG tablet Take 1 tablet (800 mg total) by mouth 3 (three) times daily. 09/22/16   Edrick Kins, DPM  gentamicin cream (GARAMYCIN) 0.1 % Apply 1 application topically 3 (three) times daily. 08/18/16   Edrick Kins, DPM  guaiFENesin-dextromethorphan (ROBITUSSIN DM) 100-10 MG/5ML syrup Take 10 mLs by mouth every 6 (six) hours as needed for cough. Patient not taking: Reported on 08/22/2016 10/23/14   Nicholes Mango, MD  ibuprofen (ADVIL,MOTRIN) 600 MG tablet Take 600 mg  by mouth every 6 (six) hours as needed for fever or mild pain.    [provider]  Influenza vac split quadrivalent PF (FLUARIX) 0.5 ML injection Inject 0.5 mLs into the muscle tomorrow at 10 am. Patient not taking: Reported on 08/22/2016 10/23/14   Nicholes Mango, MD  insulin aspart (NOVOLOG) 100 UNIT/ML injection Inject 10 Units into the skin 3 (three) times daily with meals. 10/23/14   Gouru, Illene Silver, MD  insulin glargine (LANTUS) 100 UNIT/ML injection Inject 0.25 mLs (25 Units total) into the skin 2 (two) times daily. Patient taking differently: Inject 28 Units into the skin 2 (two) times daily.  10/23/14   Gouru, Illene Silver, MD  insulin lispro (HUMALOG) 100 UNIT/ML injection Inject 1-12 Units into the skin 4 (four) times daily -  before meals and at bedtime. Sliding scale 07/11/16 07/11/17  [provider]  lidocaine (LIDODERM) 5 % Place 1 patch onto the skin daily as needed. For pain. Remove & Discard patch within 12 hours or as directed by MD    [provider]  lisinopril (PRINIVIL,ZESTRIL) 10 MG tablet Take 10 mg by mouth daily. 07/10/16 07/10/17  [provider]  metFORMIN (GLUCOPHAGE) 500 MG tablet Take 1 tablet (500 mg total) by mouth 2 (two) times daily with a meal. Patient taking differently: Take 500 mg by mouth daily.  10/23/14   Nicholes Mango, MD  naproxen (NAPROSYN) 500 MG tablet Take 1 tablet (500 mg total) by mouth 2 (two) times daily with a meal. Patient not taking: Reported on 08/22/2016 12/08/15   Carrie Mew, MD  nicotine (NICODERM CQ - DOSED IN MG/24 HOURS) 21 mg/24hr patch Place 1 patch (21 mg total) onto the skin daily. Patient not taking: Reported on 08/22/2016 10/23/14   Nicholes Mango, MD  ondansetron (ZOFRAN ODT) 4 MG disintegrating tablet Take 1 tablet (4 mg total) by mouth every 8 (eight) hours as needed for nausea or vomiting. 12/08/15   Carrie Mew, MD  oxyCODONE-acetaminophen (ROXICET) 5-325 MG tablet Take 1 tablet by mouth every 8 (eight) hours  as needed for severe pain. Must last 10 days 09/01/16   Edrick Kins, DPM  oxyCODONE-acetaminophen (ROXICET) 5-325 MG tablet Take 1 tablet by mouth every 8 (eight) hours as needed for severe pain. 09/22/16   Edrick Kins, DPM  phenazopyridine (PYRIDIUM) 200 MG tablet Take 1 tablet (200 mg total) by mouth 3 (three) times daily as needed for pain. Patient not taking: Reported on 08/22/2016 12/08/15   Carrie Mew, MD  pneumococcal 23 valent vaccine (PNU-IMMUNE) 25 MCG/0.5ML injection Inject 0.5 mLs into the muscle tomorrow at 10 am. Patient not taking: Reported on 08/22/2016 10/23/14   Nicholes Mango, MD  silver sulfADIAZINE (SILVADENE) 1 % cream Apply 1 application topically daily. Patient not taking: Reported on 08/22/2016 12/26/15   Trula Slade, DPM  silver sulfADIAZINE (SILVADENE) 1 %  cream Apply 1 application topically daily. Patient not taking: Reported on 08/22/2016 01/13/16   Edrick Kins, DPM  tiotropium (SPIRIVA) 18 MCG inhalation capsule Place 1 capsule (18 mcg total) into inhaler and inhale daily. 10/23/14   Nicholes Mango, MD    Family History Family History  Problem Relation Age of Onset  . Lung cancer Mother     Social History Social History   Tobacco Use  . Smoking status: Current Some Day Smoker    Packs/day: 0.50    Years: 30.00    Pack years: 15.00    Types: Cigarettes    Last attempt to quit: 10/05/2014    Years since quitting: 2.4  . Smokeless tobacco: Never Used  Substance Use Topics  . Alcohol use: No  . Drug use: No     Allergies   Flexeril [cyclobenzaprine]; Hydrocodone; Other; Darvocet [propoxyphene n-acetaminophen]; Fish-derived products; Propofol; Toradol [ketorolac tromethamine]; Vicodin [hydrocodone-acetaminophen]; Ketorolac; and Tramadol   Review of Systems Review of Systems  Constitutional: Positive for fever.  Respiratory: Negative for cough and shortness of breath.   Cardiovascular: Negative for chest pain.  Gastrointestinal: Negative  for abdominal pain, diarrhea, nausea and vomiting.  Genitourinary: Negative for dysuria and hematuria.  Musculoskeletal: Negative for back pain and neck pain.  Skin: Positive for color change and wound. Negative for rash.  Neurological: Negative for weakness and numbness.     Physical Exam Updated Vital Signs BP 116/71 (BP Location: Right Arm)   Pulse 94   Temp 98.3 F (36.8 C) (Oral)   Resp 18   SpO2 98%   Physical Exam  Constitutional: She is oriented to person, place, and time. She appears well-developed and well-nourished.  Sitting comfortably on examination table  HENT:  Head: Normocephalic and atraumatic.  Mouth/Throat: Oropharynx is clear and moist and mucous membranes are normal.  Eyes: Conjunctivae, EOM and lids are normal. Pupils are equal, round, and reactive to light.  Neck: Full passive range of motion without pain.  Cardiovascular: Normal rate, regular rhythm, normal heart sounds and normal pulses. Exam reveals no gallop and no friction rub.  No murmur heard. Pulses:      Radial pulses are 2+ on the right side, and 2+ on the left side.       Dorsalis pedis pulses are 2+ on the right side, and 2+ on the left side.  Pulmonary/Chest: Effort normal and breath sounds normal.  Abdominal: Soft. Normal appearance. There is no tenderness. There is no rigidity and no guarding.  Musculoskeletal: Normal range of motion.  Tenderness palpation to the left foot.  Mild crepitus noted to the left foot.  Great first toe amputation noted.  Good range of motion of left foot.  No tenderness palpation to the left knee.   Neurological: She is alert and oriented to person, place, and time.  Sensation intact along major nerve distributions of BLE.  Skin: Skin is warm and dry. Capillary refill takes less than 2 seconds.  Left foot has diffuse erythema, warmth, induration noted to the dorsal aspect that extends up towards the foot.  There is some purulent drainage noted.  There is a small  dime sized stage I ulcer to the dorsal aspect of the right foot.  There is some minimal surrounding erythema.  Psychiatric: She has a normal mood and affect. Her speech is normal.  Nursing note and vitals reviewed.    ED Treatments / Results  Labs (all labs ordered are listed, but only abnormal results are displayed) Labs  Reviewed  COMPREHENSIVE METABOLIC PANEL - Abnormal; Notable for the following components:      Result Value   Sodium 131 (*)    Chloride 97 (*)    Glucose, Bld 464 (*)    Alkaline Phosphatase 134 (*)    All other components within normal limits  I-STAT CG4 LACTIC ACID, ED - Abnormal; Notable for the following components:   Lactic Acid, Venous 2.73 (*)    All other components within normal limits  I-STAT CG4 LACTIC ACID, ED - Abnormal; Notable for the following components:   Lactic Acid, Venous 2.22 (*)    All other components within normal limits  CULTURE, BLOOD (ROUTINE X 2)  CULTURE, BLOOD (ROUTINE X 2)  CBC WITH DIFFERENTIAL/PLATELET  I-STAT BETA HCG BLOOD, ED ([facility], [facility], [facility] ONLY)    EKG  EKG Interpretation None       Radiology Dg Foot Complete Left  Result Date: 03/27/2017 CLINICAL DATA:  Pain in left foot with fever and chills for 2 days. Known left foot infection per recent MRI. History of diabetes. 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 EXAM: LEFT FOOT - COMPLETE 3+ VIEW COMPARISON:  Plain film of the left foot dated 12/16/2016. FINDINGS: Interval exuberant ossification about the first metatarsal amputation site, of uncertain significance. There is, however, lucency/demineralization of the distal portion of the remaining first metatarsal bone suggesting recurrent/residual osteomyelitis. Osseous structures of the second through fifth toes appear intact and normal in mineralization throughout. Osseous structures of the midfoot and hindfoot appear intact and normal in mineralization. IMPRESSION: 1. Lucency/demineralization within the distal  portion of the remaining first metatarsal bone, highly suspicious for recurrent or residual osteomyelitis. 2. Exuberant ossification about the first metatarsal amputation site, suspicious for aggressive periosteal reaction related to the presumed underlying osteomyelitis. Alternatively, this could represent reactive postsurgical change and/or myositis ossificans. 3. No soft tissue gas seen. Electronically Signed   By: Franki Cabot M.D.   On: 03/27/2017 23:02    Procedures Procedures (including critical care time)  Medications Ordered in ED Medications  sodium chloride 0.9 % bolus 1,000 mL (not administered)  vancomycin (VANCOCIN) IVPB 750 mg/150 ml premix (not administered)  piperacillin-tazobactam (ZOSYN) IVPB 3.375 g (not administered)     Initial Impression / Assessment and Plan / ED Course  I have reviewed the triage vital signs and the nursing notes.  Pertinent labs & imaging results that were available during my care of the patient were reviewed by me and considered in my medical decision making (see chart for details).     47 year old female who presents for evaluation of persistent left foot pain that has been ongoing.  Also with fevers.  No numbness/weakness. Patient is afebrile, non-toxic appearing, sitting comfortably on examination table. Vital signs reviewed and stable. Patient is neurovascularly intact.  Patient has good DP pulses bilaterally.  Left foot is with first toe amputation.  There is warmth, erythema noted next to the wound of the first toe.  There is active drainage noted.  Patient was admitted to [facility] and had an MRI on 2/8/819 that showed osteomyelitis of the left foot.  Patient left AMA prior to getting completion of treatment.  Initial labs ordered at triage.  I-STAT lactic acid is 2.22.  CMP shows hyperglycemia at 464.  CBC is unremarkable.  Left foot x-ray shows lucency/demineralization with the distal portion of the remaining 1st metatarsal. No soft tissue gas  seen.  Patient started on Vanco and Zosyn for treatment of  osteomyelitis.  Given concerns for further IV antibiotic patient will need admission.  Per review of patient records, she was admitted to [facility] on 03/15/17 for treatment of presumed osteomyelitis.  She had an MRI at that time that confirmed osteomyelitis.  Patient left AMA prior to receiving treatment.  Per her records, patient does have a history of polysubstance abuse and narcotic abuse.  Additionally, patient continues to smoke and have uncontrolled diabetes which her doctors felt like were contributing to her poor wound healing.  Discussed with hospitalist.  Will plan to admit.  Final Clinical Impressions(s) / ED Diagnoses   Final diagnoses:  Osteomyelitis of left foot, unspecified type [facility])    ED Discharge Orders    None       Volanda Napoleon, PA-C 03/28/17 0141    Gareth Morgan, MD 03/28/17 2120

## 2017-03-27 NOTE — ED Notes (Signed)
Lactic Acid results called from lab.  Charge nurse made aware

## 2017-03-27 NOTE — ED Notes (Signed)
Admitting MD at bedside-Monique,RN  

## 2017-03-27 NOTE — ED Triage Notes (Signed)
Pt states she has wound on left big toe, hx of diabetes. Now has fever x 2 days. No acute distress is noted at triage.

## 2017-03-27 NOTE — Progress Notes (Signed)
Pharmacy Antibiotic Note  Katie Guzman is a 47 y.o. female admitted on 03/27/2017 with wound infection/osteomyelitis.  Pharmacy has been consulted for Vancomycin/Zosyn dosing. Pt was recently admitted at [facility] for the same but left AMA on 03/15/17 with no antibiotics. WBC WNL. Renal function good.   Plan: Vancomycin 750 mg IV q8h Zosyn 3.375G IV q8h to be infused over 4 hours Trend WBC, temp, renal function  F/U infectious work-up Drug levels as indicated     Temp (24hrs), Avg:98.3 F (36.8 C), Min:98.3 F (36.8 C), Max:98.3 F (36.8 C)  Recent Labs  Lab 03/27/17 1706 03/27/17 1714 03/27/17 2010  WBC 9.3  --   --   CREATININE 0.90  --   --   LATICACIDVEN  --  2.73* 2.22*    CrCl cannot be calculated (Unknown ideal weight.).    Allergies  Allergen Reactions  . Flexeril [Cyclobenzaprine] Hives and Nausea And Vomiting  . Hydrocodone Hives  . Other Hives  . Darvocet [Propoxyphene N-Acetaminophen] Hives and Nausea And Vomiting  . Fish-Derived Products Other (See Comments)    Patient does not like to eat fish. Not allergic but prefers not to.  . Propofol   . Toradol [Ketorolac Tromethamine] Nausea And Vomiting  . Vicodin [Hydrocodone-Acetaminophen] Hives and Nausea And Vomiting  . Ketorolac Nausea And Vomiting  . Tramadol Nausea And Vomiting    Vomiting only     Narda Bonds 03/27/2017 11:01 PM

## 2017-03-27 NOTE — H&P (Signed)
History and Physical    Katie Guzman YHC:[id number] DOB: 1970/04/08 DOA: 03/27/2017  PCP: [facility] [place]   Patient coming from: Home   Chief Complaint: Pain  HPI: Katie Guzman is a 47 y.o. female with medical history significant of diabetes on insulin, gated by diabetic neuropathy and diabetic foot ulcer status post left hallux amputation at [facility] in October 2018, COPD, active tobacco abuse who comes in with worsening hallux stump pain and erythema.  Patient reports that after she had her hallux removed in October 2019 at [facility] she continued to have complications including worsening erythema and pain at the site.  She did require repeat debridement.  She was last admitted at [facility] approximately 3 weeks ago for this same recurrent issue however she reports being dissatisfied with the care there.  On review of the chart and care everywhere it appears that patient was upset with the care team because they were not adequately managing her pain with opiates which she was requesting.  She reports that for the last several days she has had a fever to T-max of 103 approximately 3 days ago and today she had a fever to 100.3 she continues to report severe Pain in the hallux stump on the left side.  On review of the chart patient has had this chronic foot ulcer for several months.  She was last hospitalized here in July 2018 for ulcer.  She apparently was found unresponsive which reversed with Narcan however she refused to let the staff search her bag and left AGAINST MEDICAL ADVICE.  She is apparently left AGAINST MEDICAL ADVICE several times at [facility] as well.  Mostly in the setting of not being given opiates.  ED Course: In the ED her vitals were unremarkable.  Her labs are notable for a mild hyponatremia of 131 which was likely exaggerated by her glucose of 464.  Her lactic acid was 2.22.  An x-ray showed concern for osteomyelitis of the foot.  Review of Systems: As per HPI otherwise 10  point review of systems negative.    Past Medical History:  Diagnosis Date  . Asthma   . Cancer ([place])    lung  . Cellulitis 04/15/2015  . COPD (chronic obstructive pulmonary disease) ([place])   . Diabetes mellitus    Poorly controlled with complications  . Emphysema   . Fibromyalgia   . Neuropathy   . Tenosynovitis of foot 04/16/2015    Past Surgical History:  Procedure Laterality Date  . BACK SURGERY    . CESAREAN SECTION    . TONSILLECTOMY    . TUBAL LIGATION       reports that she has been smoking cigarettes.  She has a 15.00 pack-year smoking history. she has never used smokeless tobacco. She reports that she does not drink alcohol or use drugs.  Allergies  Allergen Reactions  . Flexeril [Cyclobenzaprine] Hives and Nausea And Vomiting  . Hydrocodone Hives  . Other Hives  . Darvocet [Propoxyphene N-Acetaminophen] Hives and Nausea And Vomiting  . Fish-Derived Products Other (See Comments)    Patient does not like to eat fish. Not allergic but prefers not to.  . Propofol Nausea And Vomiting  . Toradol [Ketorolac Tromethamine] Nausea And Vomiting  . Vicodin [Hydrocodone-Acetaminophen] Hives and Nausea And Vomiting  . Ketorolac Nausea And Vomiting  . Tramadol Nausea And Vomiting    Vomiting only    Family History  Problem Relation Age of Onset  . Lung cancer Mother  Prior to Admission medications   Medication Sig Start Date End Date Taking? Authorizing Provider  acetaminophen (TYLENOL) 325 MG tablet Take 650 mg by mouth every 6 (six) hours as needed for mild pain.    [provider]  albuterol (PROVENTIL HFA;VENTOLIN HFA) 108 (90 BASE) MCG/ACT inhaler Inhale 2 puffs into the lungs every 4 (four) hours as needed for wheezing or shortness of breath. For wheeze or shortness of breath 10/23/14   Gouru, Illene Silver, MD  albuterol (PROVENTIL) (2.5 MG/3ML) 0.083% nebulizer solution Take 2.5 mg by nebulization every 4 (four) hours as needed for wheezing or shortness of  breath.     [provider]  amoxicillin-clavulanate (AUGMENTIN) 875-125 MG tablet Take 1 tablet by mouth 2 (two) times daily. 09/22/16   Edrick Kins, DPM  doxycycline (VIBRAMYCIN) 100 MG capsule Take 1 capsule (100 mg total) 2 (two) times daily by mouth. 12/16/16   Delman Kitten, MD  fluticasone-salmeterol (ADVAIR HFA) 240-97 MCG/ACT inhaler Inhale 2 puffs into the lungs 2 (two) times daily. 10/23/14   Nicholes Mango, MD  gabapentin (NEURONTIN) 800 MG tablet Take 1 tablet (800 mg total) by mouth 3 (three) times daily. 09/22/16   Edrick Kins, DPM  gentamicin cream (GARAMYCIN) 0.1 % Apply 1 application topically 3 (three) times daily. 08/18/16   Edrick Kins, DPM  guaiFENesin-dextromethorphan (ROBITUSSIN DM) 100-10 MG/5ML syrup Take 10 mLs by mouth every 6 (six) hours as needed for cough. Patient not taking: Reported on 08/22/2016 10/23/14   Nicholes Mango, MD  ibuprofen (ADVIL,MOTRIN) 600 MG tablet Take 600 mg by mouth every 6 (six) hours as needed for fever or mild pain.    [provider]  Influenza vac split quadrivalent PF (FLUARIX) 0.5 ML injection Inject 0.5 mLs into the muscle tomorrow at 10 am. Patient not taking: Reported on 08/22/2016 10/23/14   Nicholes Mango, MD  insulin aspart (NOVOLOG) 100 UNIT/ML injection Inject 10 Units into the skin 3 (three) times daily with meals. 10/23/14   Gouru, Illene Silver, MD  insulin glargine (LANTUS) 100 UNIT/ML injection Inject 0.25 mLs (25 Units total) into the skin 2 (two) times daily. Patient taking differently: Inject 28 Units into the skin 2 (two) times daily.  10/23/14   Gouru, Illene Silver, MD  insulin lispro (HUMALOG) 100 UNIT/ML injection Inject 1-12 Units into the skin 4 (four) times daily -  before meals and at bedtime. Sliding scale 07/11/16 07/11/17  [provider]  lidocaine (LIDODERM) 5 % Place 1 patch onto the skin daily as needed. For pain. Remove & Discard patch within 12 hours or as directed by MD    [provider]  lisinopril  (PRINIVIL,ZESTRIL) 10 MG tablet Take 10 mg by mouth daily. 07/10/16 07/10/17  [provider]  metFORMIN (GLUCOPHAGE) 500 MG tablet Take 1 tablet (500 mg total) by mouth 2 (two) times daily with a meal. Patient taking differently: Take 500 mg by mouth daily.  10/23/14   Nicholes Mango, MD  naproxen (NAPROSYN) 500 MG tablet Take 1 tablet (500 mg total) by mouth 2 (two) times daily with a meal. Patient not taking: Reported on 08/22/2016 12/08/15   Carrie Mew, MD  nicotine (NICODERM CQ - DOSED IN MG/24 HOURS) 21 mg/24hr patch Place 1 patch (21 mg total) onto the skin daily. Patient not taking: Reported on 08/22/2016 10/23/14   Nicholes Mango, MD  ondansetron (ZOFRAN ODT) 4 MG disintegrating tablet Take 1 tablet (4 mg total) by mouth every 8 (eight) hours as needed for nausea or  vomiting. 12/08/15   Carrie Mew, MD  oxyCODONE-acetaminophen (ROXICET) 5-325 MG tablet Take 1 tablet by mouth every 8 (eight) hours as needed for severe pain. Must last 10 days 09/01/16   Edrick Kins, DPM  oxyCODONE-acetaminophen (ROXICET) 5-325 MG tablet Take 1 tablet by mouth every 8 (eight) hours as needed for severe pain. 09/22/16   Edrick Kins, DPM  phenazopyridine (PYRIDIUM) 200 MG tablet Take 1 tablet (200 mg total) by mouth 3 (three) times daily as needed for pain. Patient not taking: Reported on 08/22/2016 12/08/15   Carrie Mew, MD  pneumococcal 23 valent vaccine (PNU-IMMUNE) 25 MCG/0.5ML injection Inject 0.5 mLs into the muscle tomorrow at 10 am. Patient not taking: Reported on 08/22/2016 10/23/14   Nicholes Mango, MD  silver sulfADIAZINE (SILVADENE) 1 % cream Apply 1 application topically daily. Patient not taking: Reported on 08/22/2016 12/26/15   Trula Slade, DPM  silver sulfADIAZINE (SILVADENE) 1 % cream Apply 1 application topically daily. Patient not taking: Reported on 08/22/2016 01/13/16   Edrick Kins, DPM  tiotropium (SPIRIVA) 18 MCG inhalation capsule Place 1 capsule (18 mcg total)  into inhaler and inhale daily. 10/23/14   Nicholes Mango, MD    Physical Exam: Vitals:   03/27/17 1639 03/27/17 1836 03/27/17 2300  BP: (!) 153/106 116/71 140/88  Pulse: 99 94 85  Resp: 18 18   Temp: 98.3 F (36.8 C)    TempSrc: Oral    SpO2: 99% 98% 98%    Constitutional: NAD, calm, comfortable Vitals:   03/27/17 1639 03/27/17 1836 03/27/17 2300  BP: (!) 153/106 116/71 140/88  Pulse: 99 94 85  Resp: 18 18   Temp: 98.3 F (36.8 C)    TempSrc: Oral    SpO2: 99% 98% 98%   Eyes: Anicteric sclera ENMT: Dry mucous membranes, poor dentition Neck: normal, supple, no masses, no thyromegaly Respiratory: Expiratory wheezes, scattered rhonchi, no increased work of breathing Cardiovascular: Regular rate and rhythm, no murmurs Abdomen: no tenderness, no masses palpated. No hepatosplenomegaly. Bowel sounds positive.  Musculoskeletal: Lateral pulses 2+ in lower extremities Skin:  noted to have missing hallux on left, incision site is erythematous and tender and warm with some fluctuance, noted to have over dorsal aspect of right foot 1 cm purulent ulcer Neurologic: Diminished sensation bilaterally Psychiatric: Poor judgment and insight. Alert and oriented x 3. Normal mood.    Labs on Admission: I have personally reviewed following labs and imaging studies  CBC: Recent Labs  Lab 03/27/17 1706  WBC 9.3  NEUTROABS 6.6  HGB 14.8  HCT 44.4  MCV 87.4  PLT 732   Basic Metabolic Panel: Recent Labs  Lab 03/27/17 1706  NA 131*  K 4.1  CL 97*  CO2 22  GLUCOSE 464*  BUN 15  CREATININE 0.90  CALCIUM 9.1   GFR: CrCl cannot be calculated (Unknown ideal weight.). Liver Function Tests: Recent Labs  Lab 03/27/17 1706  AST 20  ALT 14  ALKPHOS 134*  BILITOT 0.6  PROT 6.8  ALBUMIN 3.7   No results for input(s): LIPASE, AMYLASE in the last 168 hours. No results for input(s): AMMONIA in the last 168 hours. Coagulation Profile: No results for input(s): INR, PROTIME in the last  168 hours. Cardiac Enzymes: No results for input(s): CKTOTAL, CKMB, CKMBINDEX, TROPONINI in the last 168 hours. BNP (last 3 results) No results for input(s): PROBNP in the last 8760 hours. HbA1C: No results for input(s): HGBA1C in the last 72 hours. CBG: No results for  input(s): GLUCAP in the last 168 hours. Lipid Profile: No results for input(s): CHOL, HDL, LDLCALC, TRIG, CHOLHDL, LDLDIRECT in the last 72 hours. Thyroid Function Tests: No results for input(s): TSH, T4TOTAL, FREET4, T3FREE, THYROIDAB in the last 72 hours. Anemia Panel: No results for input(s): VITAMINB12, FOLATE, FERRITIN, TIBC, IRON, RETICCTPCT in the last 72 hours. Urine analysis:    Component Value Date/Time   COLORURINE YELLOW (A) 12/16/2016 1524   APPEARANCEUR CLEAR (A) 12/16/2016 1524   APPEARANCEUR Hazy 09/05/2013 0213   LABSPEC 1.032 (H) 12/16/2016 1524   LABSPEC 1.031 09/05/2013 0213   PHURINE 6.0 12/16/2016 1524   GLUCOSEU >=500 (A) 12/16/2016 1524   GLUCOSEU >=500 09/05/2013 0213   HGBUR NEGATIVE 12/16/2016 1524   BILIRUBINUR NEGATIVE 12/16/2016 1524   BILIRUBINUR Negative 09/05/2013 0213   KETONESUR NEGATIVE 12/16/2016 1524   PROTEINUR NEGATIVE 12/16/2016 1524   UROBILINOGEN 0.2 05/07/2013 1344   NITRITE NEGATIVE 12/16/2016 1524   LEUKOCYTESUR NEGATIVE 12/16/2016 1524   LEUKOCYTESUR Negative 09/05/2013 0213    Radiological Exams on Admission: Dg Foot Complete Left  Result Date: 03/27/2017 CLINICAL DATA:  Pain in left foot with fever and chills for 2 days. Known left foot infection per recent MRI. History of diabetes. 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 EXAM: LEFT FOOT - COMPLETE 3+ VIEW COMPARISON:  Plain film of the left foot dated 12/16/2016. FINDINGS: Interval exuberant ossification about the first metatarsal amputation site, of uncertain significance. There is, however, lucency/demineralization of the distal portion of the remaining first metatarsal bone suggesting  recurrent/residual osteomyelitis. Osseous structures of the second through fifth toes appear intact and normal in mineralization throughout. Osseous structures of the midfoot and hindfoot appear intact and normal in mineralization. IMPRESSION: 1. Lucency/demineralization within the distal portion of the remaining first metatarsal bone, highly suspicious for recurrent or residual osteomyelitis. 2. Exuberant ossification about the first metatarsal amputation site, suspicious for aggressive periosteal reaction related to the presumed underlying osteomyelitis. Alternatively, this could represent reactive postsurgical change and/or myositis ossificans. 3. No soft tissue gas seen. Electronically Signed   By: Franki Cabot M.D.   On: 03/27/2017 23:02    Assessment/Plan Active Problems:   Cellulitis   Diabetes mellitus (HCC)   COPD (chronic obstructive pulmonary disease) (HCC)   Diabetic neuropathy (HCC)   Diabetic foot ulcer ([place])    ##) Diabetic foot infection: Noted to have likely diabetic foot infection with osteomyelitis on left.  Likely this is in the setting of incomplete treatment with IV antibiotics and comprehensive surgical treatment. -Obtain MRI -Continue vancomycin and Zosyn -Orthopedics consult -Wound care consult  ##) COPD: -Continue inhaled corticosteroid and long-acting beta agonist -Continue PRN albuterol - Continue tiotropium daily  ##) Type 2 diabetes on insulin -Continue glargine 25 units twice a day, patient reports taking up to 56 units at bedtime however we will split dose for now -Continue lispro 10 units with meals, patient apparently takes a sliding scale 4 times a day  ##) Psych/pain: At this time on review of the chart there is been multiple red flags raised that the patient is attempting to obtain or manipulate providers and to giving her opiates.  We will be very cautious with giving her opiates and consider placing a sitter for her safety as well if she displays  any concern about using recreational drugs while she is hospitalized.  We will query [place] [facility] -Continue gabapentin 800 mg 3 times daily, patient reports taking 1200 mg 3 times daily however will start  with lower dose -Hold oxycodone 10 mg 3 times daily as reported  Fluids: IV fluids Electrolytes: Monitor and supplement Nutrition: Carb diet  Prophylaxis: Enoxaparin  Disposition: Pending definitive treatment of osteomyelitis  Full code   Cristy Folks MD [facility]   If 7PM-7AM, please contact night-coverage [email or web address] Password [facility]  03/27/2017, 11:27 PM

## 2017-03-28 LAB — SEDIMENTATION RATE: Sed Rate: 1 mm/h (ref 0–22)

## 2017-03-28 LAB — CBC
HCT: 42.6 % (ref 36.0–46.0)
Hemoglobin: 13.9 g/dL (ref 12.0–15.0)
MCH: 28.7 pg (ref 26.0–34.0)
MCHC: 32.6 g/dL (ref 30.0–36.0)
MCV: 87.8 fL (ref 78.0–100.0)
Platelets: 276 10*3/uL (ref 150–400)
RBC: 4.85 MIL/uL (ref 3.87–5.11)
RDW: 13.5 % (ref 11.5–15.5)
WBC: 8.7 10*3/uL (ref 4.0–10.5)

## 2017-03-28 LAB — BASIC METABOLIC PANEL
Anion gap: 13 (ref 5–15)
CO2: 21 mmol/L — ABNORMAL LOW (ref 22–32)
Calcium: 8.7 mg/dL — ABNORMAL LOW (ref 8.9–10.3)
Creatinine, Ser: 0.8 mg/dL (ref 0.44–1.00)
GFR calc Af Amer: >60 mL/min (ref >60)
GFR calc non Af Amer: >60 mL/min (ref >60)
Sodium: 133 mmol/L — ABNORMAL LOW (ref 135–145)

## 2017-03-28 LAB — CBG MONITORING, ED: Glucose-Capillary: 322 mg/dL — ABNORMAL HIGH (ref 65–99)

## 2017-03-28 LAB — BASIC METABOLIC PANEL WITH GFR
BUN: 13 mg/dL (ref 6–20)
Chloride: 99 mmol/L — ABNORMAL LOW (ref 101–111)
Glucose, Bld: 429 mg/dL — ABNORMAL HIGH (ref 65–99)
Potassium: 4 mmol/L (ref 3.5–5.1)

## 2017-03-28 LAB — C-REACTIVE PROTEIN: CRP: 1.4 mg/dL — ABNORMAL HIGH (ref <1.0)

## 2017-03-28 LAB — GLUCOSE, CAPILLARY: Glucose-Capillary: 329 mg/dL — ABNORMAL HIGH (ref 65–99)

## 2017-03-28 MED ORDER — INSULIN ASPART 100 UNIT/ML ~~LOC~~ SOLN
10.0000 [IU] | Freq: Three times a day (TID) | SUBCUTANEOUS | Status: DC
  Administered 2017-03-28: 10 [IU] via SUBCUTANEOUS

## 2017-03-28 MED ORDER — ONDANSETRON HCL 4 MG PO TABS: 4.0000 mg | ORAL_TABLET | Freq: Four times a day (QID) | ORAL | Status: DC | PRN

## 2017-03-28 MED ORDER — LISINOPRIL 5 MG PO TABS
5.0000 mg | ORAL_TABLET | Freq: Every day | ORAL | Status: DC
  Filled 2017-03-28: qty 1

## 2017-03-28 MED ORDER — ONDANSETRON HCL 4 MG/2ML IJ SOLN: 4.0000 mg | Freq: Four times a day (QID) | INTRAMUSCULAR | Status: DC | PRN

## 2017-03-28 MED ORDER — ALBUTEROL SULFATE (2.5 MG/3ML) 0.083% IN NEBU: 2.5000 mg | INHALATION_SOLUTION | RESPIRATORY_TRACT | Status: DC | PRN

## 2017-03-28 MED ORDER — INSULIN GLARGINE 100 UNIT/ML ~~LOC~~ SOLN
25.0000 [IU] | Freq: Two times a day (BID) | SUBCUTANEOUS | Status: DC
  Administered 2017-03-28: 25 [IU] via SUBCUTANEOUS
  Filled 2017-03-28 (×2): qty 0.25

## 2017-03-28 MED ORDER — POLYETHYLENE GLYCOL 3350 17 G PO PACK: 17.0000 g | PACK | Freq: Every day | ORAL | Status: DC | PRN

## 2017-03-28 MED ORDER — INSULIN ASPART 100 UNIT/ML ~~LOC~~ SOLN
0.0000 [IU] | Freq: Three times a day (TID) | SUBCUTANEOUS | Status: DC
  Administered 2017-03-28: 7 [IU] via SUBCUTANEOUS

## 2017-03-28 MED ORDER — MOMETASONE FURO-FORMOTEROL FUM 200-5 MCG/ACT IN AERO
2.0000 | INHALATION_SPRAY | Freq: Two times a day (BID) | RESPIRATORY_TRACT | Status: DC
  Filled 2017-03-28: qty 8.8

## 2017-03-28 MED ORDER — ENOXAPARIN SODIUM 40 MG/0.4ML ~~LOC~~ SOLN
40.0000 mg | Freq: Every day | SUBCUTANEOUS | Status: DC
  Filled 2017-03-28: qty 0.4

## 2017-03-28 MED ORDER — TIOTROPIUM BROMIDE MONOHYDRATE 18 MCG IN CAPS
18.0000 ug | ORAL_CAPSULE | Freq: Every day | RESPIRATORY_TRACT | Status: DC
  Filled 2017-03-28: qty 5

## 2017-03-28 NOTE — ED Notes (Signed)
RN attempted to start IV; 2nd RN to obtain blood and start IV-Monique,RN

## 2017-03-28 NOTE — ED Notes (Signed)
Pt tells EMT that she needs to get her [place] before transferring to the flor; pt continue to stall transfer to floor with the hopes that daughter will show up; Pt advised again no pain meds ordered and she will will need to transfer or loose the bed-Monique,RN

## 2017-03-28 NOTE — ED Notes (Signed)
Patient stated to this nurse that she was planning to leave AMA (and stated that she would NOT EVER sign an AMA form) because the admitting provider did not write an order for any new pain medications. Patient was on the telephone with her daughter and told her that "you are only 20 fucking minutes away, come and get me". Patient did allow this nurse to obtain one set of blood cultures and place an IV>

## 2017-03-28 NOTE — Discharge Summary (Signed)
Physician Discharge Summary  Katie Guzman WNU:[id number] DOB: 27-May-1970 DOA: 03/27/2017  PCP: [facility] [place] date: 03/27/2017 Discharge date: 03/28/2017  Admitted From: Home Disposition:  Home, [place]  Recommendations for Outpatient Follow-up:  1. Please follow-up with any provider soon as possible for your leg infection.    Brief/Interim Summary: Katie Guzman is a 47 y.o. female with medical history significant of diabetes on insulin, gated by diabetic neuropathy and diabetic foot ulcer status post left hallux amputation at [facility] in October 2018, COPD, active tobacco abuse who comes in with worsening hallux stump pain and erythema.  Patient reports that after she had her hallux removed in October 2019 at [facility] she continued to have complications including worsening erythema and pain at the site.  She did require repeat debridement.  She was last admitted at [facility] approximately 3 weeks ago for this same recurrent issue however she reports being dissatisfied with the care there.  On review of the chart and care everywhere it appears that patient was upset with the care team because they were not adequately managing her pain with opiates which she was requesting.  She reports that for the last several days she has had a fever to T-max of 103 approximately 3 days ago and today she had a fever to 100.3 she continues to report severe Pain in the hallux stump on the left side.  On review of the chart patient has had this chronic foot ulcer for several months.  She was last hospitalized here in July 2018 for ulcer.  She apparently was found unresponsive which reversed with Narcan however she refused to let the staff search her bag and left AGAINST MEDICAL ADVICE.  She is apparently left AGAINST MEDICAL ADVICE several times at [facility] as well.  Mostly in the setting of not being given opiates.  ED Course: In the ED her vitals were unremarkable.  Her labs are notable  for a mild hyponatremia of 131 which was likely exaggerated by her glucose of 464.  Her lactic acid was 2.22.  An x-ray showed concern for osteomyelitis of the foot.   Discharge Diagnoses:  Active Problems:   Cellulitis   Diabetes mellitus (HCC)   COPD (chronic obstructive pulmonary disease) (HCC)   Diabetic neuropathy (HCC)   Diabetic foot ulcer (HCC)   Diabetic foot infection ([place])  ##) Diabetic foot infection: Probably osteomyelitis based on long untreated diabetic foot infection and x-ray done in the ED today.  She was started on IV antibiotics and admitted with consideration of orthopedics consult on MRI.  Patient left AMA at approximately 10 AM on 03/28/2017 because she would not be given any pain medication.  Please see below for full explanation.    ##) Behavioral issues/substance abuse: On review of the chart in care everywhere patient has been admitted numerous times to [facility] for same foot infection.  Per most recent note there expressed concern that patient was significantly abusive towards staff when she was not given any opiates.  She was noted to have positive UDS for cocaine.  On review of [place] [facility] and [facility] documentation patient has had numerous prescriptions for opiates from numerous providers and currently has no outpatient provider willing to prescribe her opiates.  Per the [facility] documentation the patient would often become quite combative with staff and providers when not given opiate medications and would endorse allergies to nonopiate alternatives. This provider was called into the room several hours after admission this patient was demanding  opiate pain medication.  This provider explained documentation from [facility] had been reviewed as well as [place] [facility] and raise the concern the patient behavior was concerning for possible drug-seeking.  Informed patient that for her own safety she would not be provided with opiate pain medication.  Several alternatives were offered including Toradol,  tramadol, ibuprofen, other partial mu opiate antagonists however patient became aggressive and upset.  But informed the patient that these concerns were raised based on prior documentation patient reported that she "had not given permission to review of the records".  Informed the patient that in this writer's estimation she very much had to have her blood sugars controlled and this foot infection evaluated while in the hospital and that we would do our best to accommodate and manage her pain without resorting to opiate pain medications.  Patient became verbally abusive and upset.  Patient reported she would call her daughter. On review of the chart and nursing notes, patient apparently stayed until the early morning demanded pain medications before the morning provider could round on her.  When these were not given she left AGAINST MEDICAL ADVICE.  ##) Type 2 diabetes: Poorly controlled.  She was started on a lower dose insulin regimen as she was ill and did have an active infection and was going to be given a low carbohydrate diet.  Her blood sugars were in the 300s.  Discharge Instructions   Allergies as of 03/28/2017      Reactions   Flexeril [cyclobenzaprine] Hives, Nausea And Vomiting   Hydrocodone Hives   Other Hives   Darvocet [propoxyphene N-acetaminophen] Hives, Nausea And Vomiting   Fish-derived Products Other (See Comments)   Patient does not like to eat fish. Not allergic but prefers not to.   Propofol Nausea And Vomiting   Toradol [ketorolac Tromethamine] Nausea And Vomiting   Vicodin [hydrocodone-acetaminophen] Hives, Nausea And Vomiting   Ketorolac Nausea And Vomiting   Tramadol Nausea And Vomiting   Vomiting only      Medication List    ASK your doctor about these medications   acetaminophen 325 MG tablet Commonly known as:  TYLENOL Take 650 mg by mouth every 6 (six) hours as needed for mild pain.   albuterol 108 (90 Base) MCG/ACT inhaler Commonly known as:  PROVENTIL  HFA;VENTOLIN HFA Inhale 2 puffs into the lungs every 4 (four) hours as needed for wheezing or shortness of breath. For wheeze or shortness of breath   albuterol (2.5 MG/3ML) 0.083% nebulizer solution Commonly known as:  PROVENTIL Take 2.5 mg by nebulization every 4 (four) hours as needed for wheezing or shortness of breath.   fluticasone-salmeterol 115-21 MCG/ACT inhaler Commonly known as:  ADVAIR HFA Inhale 2 puffs into the lungs 2 (two) times daily.   gabapentin 800 MG tablet Commonly known as:  NEURONTIN Take 1 tablet (800 mg total) by mouth 3 (three) times daily.   ibuprofen 600 MG tablet Commonly known as:  ADVIL,MOTRIN Take 600 mg by mouth every 6 (six) hours as needed for fever or mild pain.   insulin glargine 100 UNIT/ML injection Commonly known as:  LANTUS Inject 0.25 mLs (25 Units total) into the skin 2 (two) times daily.   insulin lispro 100 UNIT/ML injection Commonly known as:  HUMALOG Inject 1-18 Units into the skin 4 (four) times daily -  before meals and at bedtime. Sliding scale   lidocaine 5 % Commonly known as:  LIDODERM Place 1 patch onto the skin daily as needed. For pain. Remove &  Discard patch within 12 hours or as directed by MD   lisinopril 10 MG tablet Commonly known as:  PRINIVIL,ZESTRIL Take 5 mg by mouth daily.   nicotine 21 mg/24hr patch Commonly known as:  NICODERM CQ - dosed in mg/24 hours Place 1 patch (21 mg total) onto the skin daily.   ondansetron 4 MG disintegrating tablet Commonly known as:  ZOFRAN ODT Take 1 tablet (4 mg total) by mouth every 8 (eight) hours as needed for nausea or vomiting.   oxyCODONE 5 MG immediate release tablet Commonly known as:  Oxy IR/ROXICODONE Take 10 mg by mouth 3 (three) times daily.   tiotropium 18 MCG inhalation capsule Commonly known as:  SPIRIVA Place 1 capsule (18 mcg total) into inhaler and inhale daily.       Allergies  Allergen Reactions  . Flexeril [Cyclobenzaprine] Hives and Nausea And  Vomiting  . Hydrocodone Hives  . Other Hives  . Darvocet [Propoxyphene N-Acetaminophen] Hives and Nausea And Vomiting  . Fish-Derived Products Other (See Comments)    Patient does not like to eat fish. Not allergic but prefers not to.  . Propofol Nausea And Vomiting  . Toradol [Ketorolac Tromethamine] Nausea And Vomiting  . Vicodin [Hydrocodone-Acetaminophen] Hives and Nausea And Vomiting  . Ketorolac Nausea And Vomiting  . Tramadol Nausea And Vomiting    Vomiting only    Consultations:  Orthopedics, never formally evaluated the patient as she left AMA   Procedures/Studies: Dg Foot Complete Left  Result Date: 03/27/2017 CLINICAL DATA:  Pain in left foot with fever and chills for 2 days. Known left foot infection per recent MRI. History of diabetes. 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 EXAM: LEFT FOOT - COMPLETE 3+ VIEW COMPARISON:  Plain film of the left foot dated 12/16/2016. FINDINGS: Interval exuberant ossification about the first metatarsal amputation site, of uncertain significance. There is, however, lucency/demineralization of the distal portion of the remaining first metatarsal bone suggesting recurrent/residual osteomyelitis. Osseous structures of the second through fifth toes appear intact and normal in mineralization throughout. Osseous structures of the midfoot and hindfoot appear intact and normal in mineralization. IMPRESSION: 1. Lucency/demineralization within the distal portion of the remaining first metatarsal bone, highly suspicious for recurrent or residual osteomyelitis. 2. Exuberant ossification about the first metatarsal amputation site, suspicious for aggressive periosteal reaction related to the presumed underlying osteomyelitis. Alternatively, this could represent reactive postsurgical change and/or myositis ossificans. 3. No soft tissue gas seen. Electronically Signed   By: Franki Cabot M.D.   On: 03/27/2017 23:02       Subjective:   Discharge Exam: Vitals:   03/27/17 2300 03/28/17 0927  BP: 140/88   Pulse: 85   Resp:    Temp:    SpO2: 98% 94%   Vitals:   03/27/17 1639 03/27/17 1836 03/27/17 2300 03/28/17 0927  BP: (!) 153/106 116/71 140/88   Pulse: 99 94 85   Resp: 18 18    Temp: 98.3 F (36.8 C)     TempSrc: Oral     SpO2: 99% 98% 98% 94%  Of note this exam is from her admission.  She was not evaluated by physician before leaving AMA  Eyes: Anicteric sclera ENMT: Dry mucous membranes, poor dentition Neck: normal, supple, no masses, no thyromegaly Respiratory: Expiratory wheezes, scattered rhonchi, no increased work of breathing Cardiovascular: Regular rate and rhythm, no murmurs Abdomen: no tenderness, no masses palpated. No hepatosplenomegaly. Bowel sounds positive.  Musculoskeletal: Lateral pulses 2+ in lower extremities  Skin:  noted to have missing hallux on left, incision site is erythematous and tender and warm with some fluctuance, noted to have over dorsal aspect of right foot 1 cm purulent ulcer Neurologic: Diminished sensation bilaterally Psychiatric: Poor judgment and insight. Alert and oriented x 3. Normal mood.      The results of significant diagnostics from this hospitalization (including imaging, microbiology, ancillary and laboratory) are listed below for reference.     Microbiology: No results found for this or any previous visit (from the past 240 hour(s)).   Labs: BNP (last 3 results) No results for input(s): BNP in the last 8760 hours. Basic Metabolic Panel: Recent Labs  Lab 03/27/17 1706 03/28/17 0303  NA 131* 133*  K 4.1 4.0  CL 97* 99*  CO2 22 21*  GLUCOSE 464* 429*  BUN 15 13  CREATININE 0.90 0.80  CALCIUM 9.1 8.7*   Liver Function Tests: Recent Labs  Lab 03/27/17 1706  AST 20  ALT 14  ALKPHOS 134*  BILITOT 0.6  PROT 6.8  ALBUMIN 3.7   No results for input(s): LIPASE, AMYLASE in the last 168 hours. No results for input(s):  AMMONIA in the last 168 hours. CBC: Recent Labs  Lab 03/27/17 1706 03/28/17 0303  WBC 9.3 8.7  NEUTROABS 6.6  --   HGB 14.8 13.9  HCT 44.4 42.6  MCV 87.4 87.8  PLT 300 276   Cardiac Enzymes: No results for input(s): CKTOTAL, CKMB, CKMBINDEX, TROPONINI in the last 168 hours. BNP: Invalid input(s): POCBNP CBG: Recent Labs  Lab 03/28/17 0010 03/28/17 0746  GLUCAP 322* 329*   D-Dimer No results for input(s): DDIMER in the last 72 hours. Hgb A1c No results for input(s): HGBA1C in the last 72 hours. Lipid Profile No results for input(s): CHOL, HDL, LDLCALC, TRIG, CHOLHDL, LDLDIRECT in the last 72 hours. Thyroid function studies No results for input(s): TSH, T4TOTAL, T3FREE, THYROIDAB in the last 72 hours.  Invalid input(s): FREET3 Anemia work up No results for input(s): VITAMINB12, FOLATE, FERRITIN, TIBC, IRON, RETICCTPCT in the last 72 hours. Urinalysis    Component Value Date/Time   COLORURINE YELLOW (A) 12/16/2016 1524   APPEARANCEUR CLEAR (A) 12/16/2016 1524   APPEARANCEUR [place] 09/05/2013 0213   LABSPEC 1.032 (H) 12/16/2016 1524   LABSPEC 1.031 09/05/2013 0213   PHURINE 6.0 12/16/2016 1524   GLUCOSEU >=500 (A) 12/16/2016 1524   GLUCOSEU >=500 09/05/2013 0213   HGBUR NEGATIVE 12/16/2016 1524   BILIRUBINUR NEGATIVE 12/16/2016 1524   BILIRUBINUR Negative 09/05/2013 0213   KETONESUR NEGATIVE 12/16/2016 1524   PROTEINUR NEGATIVE 12/16/2016 1524   UROBILINOGEN 0.2 05/07/2013 1344   NITRITE NEGATIVE 12/16/2016 1524   LEUKOCYTESUR NEGATIVE 12/16/2016 1524   LEUKOCYTESUR Negative 09/05/2013 0213   Sepsis Labs Invalid input(s): PROCALCITONIN,  WBC,  LACTICIDVEN Microbiology No results found for this or any previous visit (from the past 240 hour(s)).   Time coordinating discharge: Over 30 minutes  SIGNED:   Cristy Folks, MD  [facility] 03/28/2017, 6:05 PM  If 7PM-7AM, please contact night-coverage [email or web address] Password [email or web address]

## 2017-03-28 NOTE — ED Notes (Signed)
Admitting MD spoke with RN and was told pt threatens to leave AMA if no pain meds provided; Admitting is currently speaking with patient about concerns; RN has given report to [place] but will call back with update on if patient will sign out AMA or  Come to floor-Monique,RN

## 2017-03-28 NOTE — ED Notes (Signed)
Pt states she wants to leave AMA but wants to confirm she has a ride before signing out; pt is currently weeping and calling daughter for a ride;pt was upset that MD was able to read and see notes from other Md's; Daughter currently attempting to convince patient not to leave.-Monique,RN

## 2017-03-28 NOTE — Progress Notes (Signed)
Upon entering patient's room with morning medications, patient was irate screaming and cursing stating she is in pain and she will leave if no one gives her any pain medication for her foot pain. No pain medication was ordered so I paged attending Dr. Marthenia Rolling with patient's complaint and asked him to come see her. Dr. Marthenia Rolling called back stating he will try and come see her as soon as he can but if she wants to leave before he comes to see her then have her sign AMA paper. At 10:30am patient called out stating she was leaving now and for nurse to remove her IV. IV was removed and AMA paper was signed by patient.

## 2017-03-28 NOTE — ED Notes (Addendum)
RN has been to patient's room several times; pt remains on the phone with her daughter; pt is sitting on the bed just having normal conversation with family; RN advised pt that room will be reassigned if she does not want to stay; pt yells at family member about daughter not coming to get her; pt states she will stay; RN to have 2nd RN to start IV and obtain blood; Pt will be transferred to floor once IV has been started; 5N notified-Monique,RN

## 2017-03-28 NOTE — ED Notes (Signed)
MD paged due to patient demanding pain meds; pt threatens to leave if pain meds not provided; pt given Gabapentin ordered but disputes  800 mg ordered instead of the 1200 mg she takes at home; Pt was questioning if providers are able to see what has happened in her care at other hospitals and questions if that is why pain meds not ordered-Monique,RN

## 2017-03-28 NOTE — ED Notes (Signed)
2nd RN at bedside atttempting IV start-Monqiue,RN

## 2017-03-28 NOTE — Discharge Summary (Signed)
Admitted 03/27/2017. Patient signed herself out of the hospital [facility] before I could see the patient. This will serve as the patient's discharge summary

## 2017-04-01 ENCOUNTER — Other Ambulatory Visit: Payer: Self-pay

## 2017-04-01 MED ORDER — GABAPENTIN 800 MG PO TABS: 800.0000 mg | ORAL_TABLET | Freq: Three times a day (TID) | ORAL | 1 refills | Status: DC

## 2017-04-01 NOTE — Telephone Encounter (Signed)
Pharmacy request refill auth for Gabapentin.  Per Dr. Amalia Hailey, ok to refill.  Script has been sent to pharmacy.

## 2017-04-02 LAB — CULTURE, BLOOD (ROUTINE X 2)
Culture: NO GROWTH
Culture: NO GROWTH
Special Requests: ADEQUATE
Special Requests: ADEQUATE

## 2017-04-13 ENCOUNTER — Telehealth: Payer: Self-pay | Admitting: *Deleted

## 2017-04-13 NOTE — Telephone Encounter (Signed)
I informed Katie Guzman our office had received a letter from [facility], locking Dr. Amalia Hailey in as her prescriber for narcotics, and Dr. Amalia Hailey could not do that. Katie Guzman states she had received the same letter and she was confused, she has not seen [place] in at least 8 months and has had an amputation. I told Katie Guzman that I researched, so she would not be without a prescriber if needed and she can call [employer] [phone] follow the prompts for the Lock-In Program and set up her current caregiver. Katie Guzman states understanding and thanked me for my concern.

## 2017-04-13 NOTE — Telephone Encounter (Signed)
Received notification letter, pt had put Dr. Daylene Katayama for her Lock-In Doctor to prescribe narcotics. Dr. Amalia Hailey states he will not be pt's Lock-in Doctor for narcotics.

## 2017-04-13 NOTE — Telephone Encounter (Signed)
[place] Specialist states to get Dr. Amalia Hailey out of the the Lock-In Doctor to prescribe narcotics, there has to be a replacement doctor, either a PCP or one selected by pt as pt selected Dr. Amalia Hailey, Reference Call# [phone]. Rosendo Gros states contact pt have her call [phone] and follow prompts for Lock-in.

## 2017-07-31 ENCOUNTER — Emergency Department
Admission: EM | Admit: 2017-07-31 | Discharge: 2017-07-31 | Discharge status: Against Medical Advice | Payer: Medicaid Other | Attending: Emergency Medicine | Admitting: Emergency Medicine

## 2017-07-31 DIAGNOSIS — E1140 Type 2 diabetes mellitus with diabetic neuropathy, unspecified: Secondary | ICD-10-CM | POA: Insufficient documentation

## 2017-07-31 DIAGNOSIS — T7421XA Adult sexual abuse, confirmed, initial encounter: Secondary | ICD-10-CM | POA: Diagnosis not present

## 2017-07-31 DIAGNOSIS — Z79899 Other long term (current) drug therapy: Secondary | ICD-10-CM | POA: Insufficient documentation

## 2017-07-31 DIAGNOSIS — J449 Chronic obstructive pulmonary disease, unspecified: Secondary | ICD-10-CM | POA: Diagnosis not present

## 2017-07-31 DIAGNOSIS — F17210 Nicotine dependence, cigarettes, uncomplicated: Secondary | ICD-10-CM | POA: Diagnosis not present

## 2017-07-31 DIAGNOSIS — Z794 Long term (current) use of insulin: Secondary | ICD-10-CM | POA: Insufficient documentation

## 2017-07-31 LAB — URINALYSIS, COMPLETE (UACMP) WITH MICROSCOPIC
BILIRUBIN URINE: NEGATIVE
Bacteria, UA: NONE SEEN
Ketones, ur: 5 mg/dL — AB
NITRITE: NEGATIVE
Protein, ur: NEGATIVE mg/dL
SPECIFIC GRAVITY, URINE: 1.031 — AB (ref 1.005–1.030)
pH: 5 (ref 5.0–8.0)

## 2017-07-31 LAB — BASIC METABOLIC PANEL
Anion gap: 10 (ref 5–15)
BUN: 23 mg/dL — AB (ref 6–20)
CHLORIDE: 93 mmol/L — AB (ref 101–111)
CO2: 26 mmol/L (ref 22–32)
CREATININE: 1 mg/dL (ref 0.44–1.00)
Calcium: 9.2 mg/dL (ref 8.9–10.3)
GFR calc Af Amer: >60 mL/min (ref >60)
GFR calc non Af Amer: >60 mL/min (ref >60)
GLUCOSE: 434 mg/dL — AB (ref 65–99)
POTASSIUM: 4.4 mmol/L (ref 3.5–5.1)
SODIUM: 129 mmol/L — AB (ref 135–145)

## 2017-07-31 MED ORDER — AZITHROMYCIN 500 MG PO TABS: 1000.0000 mg | ORAL_TABLET | Freq: Once | ORAL | Status: DC

## 2017-07-31 MED ORDER — TETANUS-DIPHTH-ACELL PERTUSSIS 5-2.5-18.5 LF-MCG/0.5 IM SUSP: 0.5000 mL | Freq: Once | INTRAMUSCULAR | Status: DC

## 2017-07-31 MED ORDER — NICOTINE 21 MG/24HR TD PT24
21.0000 mg | MEDICATED_PATCH | Freq: Once | TRANSDERMAL | Status: DC
  Administered 2017-07-31: 21 mg via TRANSDERMAL

## 2017-07-31 MED ORDER — NICOTINE 21 MG/24HR TD PT24
MEDICATED_PATCH | TRANSDERMAL | Status: AC
  Administered 2017-07-31: 21 mg via TRANSDERMAL
  Filled 2017-07-31: qty 1

## 2017-07-31 MED ORDER — ELVITEG-COBIC-EMTRICIT-TENOFAF 150-150-200-10 MG PO TABS
1.0000 | ORAL_TABLET | Freq: Every day | ORAL | Status: DC
  Filled 2017-07-31: qty 5

## 2017-07-31 MED ORDER — NICOTINE 14 MG/24HR TD PT24: 14.0000 mg | MEDICATED_PATCH | Freq: Once | TRANSDERMAL | Status: DC

## 2017-07-31 MED ORDER — METRONIDAZOLE 500 MG PO TABS: 2000.0000 mg | ORAL_TABLET | Freq: Once | ORAL | Status: DC

## 2017-07-31 MED ORDER — LIDOCAINE HCL (PF) 1 % IJ SOLN: 0.9000 mL | Freq: Once | INTRAMUSCULAR | Status: DC

## 2017-07-31 MED ORDER — HEPATITIS B VAC RECOMBINANT 10 MCG/0.5ML IJ SUSP
1.0000 mL | Freq: Once | INTRAMUSCULAR | Status: DC
  Filled 2017-07-31: qty 1

## 2017-07-31 MED ORDER — ELVITEG-COBIC-EMTRICIT-TENOFAF 150-150-200-10 MG PO TABS: 1.0000 | ORAL_TABLET | Freq: Every day | ORAL | 0 refills | Status: DC

## 2017-07-31 MED ORDER — CEFTRIAXONE SODIUM 250 MG IJ SOLR: 250.0000 mg | Freq: Once | INTRAMUSCULAR | Status: DC

## 2017-07-31 MED ORDER — SODIUM CHLORIDE 0.9 % IV SOLN: 1000.0000 mL | Freq: Once | INTRAVENOUS | Status: DC

## 2017-07-31 NOTE — ED Notes (Signed)
Pt CBG 479, pt refusing IV at this time EDP aware.

## 2017-07-31 NOTE — ED Notes (Signed)
Pt refuses to wait on antibiotics and is signing AMA. This RN notified SANE RN of the [place].

## 2017-07-31 NOTE — ED Notes (Addendum)
Pt was fond outside by [facility] clinic smoking pt was brought back in with detectives  and explained that not smoking out side is allowed pt must stay in room.
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
## 2017-07-31 NOTE — ED Notes (Signed)
POC preg negative.
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
## 2017-07-31 NOTE — SANE Note (Signed)
I attempted to call the patient again and got the same message, again the call did not go through. There is no option to leave a voice mail and no forwarding number. At this time, the patient cannot be reached.
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
## 2017-07-31 NOTE — ED Notes (Signed)
This RN informed by SANE RN of the paperwork and medication to give if opt returns. Otherwise pt can return in queue and wait for the next open time.
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
## 2017-08-03 NOTE — Consult Note (Signed)
07/31/17 @ 1620- contacted RN Judeth Horn at [facility] to inquire about disposition of Ms. Kanady in order to determine if I needed to respond to [facility]. RN Judeth Horn advised Ms. Niday had signed out AMA at 12:28, refused all medications, including Genvoya and had not returned.

## 2017-08-03 NOTE — Consult Note (Signed)
Advised Dr. Corky Downs I would be en route with estimated arrival of 10:30.

## 2017-08-03 NOTE — Congregational Nurse Program (Deleted)
07/31/17 @ 1620- contacted RN Judeth Horn at [facility] to inquire about disposition of Ms. Dingledine in order to determine if I needed to respond to [facility]. RN Judeth Horn advised Ms. Nobel had signed out AMA at 12:28 and had not returned.

## 2017-08-03 NOTE — Consult Note (Signed)
08/02/17 @ approx. 0945- Notified Daphne at [facility] that they could place the prescription for [facility] on hold because Ms. Purdum had refused the 5 days provided by [facility] and that attempts to reach Ms. Ransom had been unsuccessful.
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
## 2017-08-08 NOTE — SANE Note (Signed)
This file was requested by Detective Rico Ala with [employer].   There are no signed releases on file. A release of personal health information was scanned and sent via e-mail  to [employer].

## 2017-11-26 ENCOUNTER — Ambulatory Visit: Payer: Medicaid Other

## 2017-11-26 ENCOUNTER — Encounter: Payer: Medicaid Other | Admitting: Podiatry

## 2017-12-01 NOTE — Progress Notes (Signed)
This encounter was created in error - please disregard.

## 2018-01-12 ENCOUNTER — Emergency Department
Admission: EM | Admit: 2018-01-12 | Discharge: 2018-01-12 | Disposition: A | Discharge status: Home / Self Care | Payer: Medicaid Other | Attending: Emergency Medicine | Admitting: Emergency Medicine

## 2018-01-12 ENCOUNTER — Other Ambulatory Visit: Payer: Self-pay

## 2018-01-12 DIAGNOSIS — G893 Neoplasm related pain (acute) (chronic): Secondary | ICD-10-CM | POA: Diagnosis not present

## 2018-01-12 DIAGNOSIS — J449 Chronic obstructive pulmonary disease, unspecified: Secondary | ICD-10-CM | POA: Insufficient documentation

## 2018-01-12 DIAGNOSIS — Z85118 Personal history of other malignant neoplasm of bronchus and lung: Secondary | ICD-10-CM | POA: Insufficient documentation

## 2018-01-12 DIAGNOSIS — Z923 Personal history of irradiation: Secondary | ICD-10-CM | POA: Diagnosis not present

## 2018-01-12 DIAGNOSIS — F17210 Nicotine dependence, cigarettes, uncomplicated: Secondary | ICD-10-CM | POA: Diagnosis not present

## 2018-01-12 DIAGNOSIS — E1140 Type 2 diabetes mellitus with diabetic neuropathy, unspecified: Secondary | ICD-10-CM | POA: Insufficient documentation

## 2018-01-12 DIAGNOSIS — R0602 Shortness of breath: Secondary | ICD-10-CM | POA: Diagnosis not present

## 2018-01-12 DIAGNOSIS — Z9221 Personal history of antineoplastic chemotherapy: Secondary | ICD-10-CM | POA: Insufficient documentation

## 2018-01-12 DIAGNOSIS — Z79899 Other long term (current) drug therapy: Secondary | ICD-10-CM | POA: Diagnosis not present

## 2018-01-12 DIAGNOSIS — R070 Pain in throat: Secondary | ICD-10-CM | POA: Diagnosis present

## 2018-01-12 DIAGNOSIS — Z794 Long term (current) use of insulin: Secondary | ICD-10-CM | POA: Diagnosis not present

## 2018-01-12 LAB — CBC
HEMATOCRIT: 50.4 % — AB (ref 36.0–46.0)
Hemoglobin: 15.6 g/dL — ABNORMAL HIGH (ref 12.0–15.0)
MCH: 28.1 pg (ref 26.0–34.0)
MCHC: 31 g/dL (ref 30.0–36.0)
MCV: 90.8 fL (ref 80.0–100.0)
Platelets: 460 10*3/uL — ABNORMAL HIGH (ref 150–400)
RBC: 5.55 MIL/uL — AB (ref 3.87–5.11)
RDW: 13 % (ref 11.5–15.5)
WBC: 7.7 10*3/uL (ref 4.0–10.5)
nRBC: 0 % (ref 0.0–0.2)

## 2018-01-12 LAB — BASIC METABOLIC PANEL
ANION GAP: 12 (ref 5–15)
BUN: 14 mg/dL (ref 6–20)
CHLORIDE: 99 mmol/L (ref 98–111)
CO2: 24 mmol/L (ref 22–32)
CREATININE: 0.63 mg/dL (ref 0.44–1.00)
Calcium: 9.7 mg/dL (ref 8.9–10.3)
GFR calc non Af Amer: >60 mL/min (ref >60)
GLUCOSE: 251 mg/dL — AB (ref 70–99)
Potassium: 5.4 mmol/L — ABNORMAL HIGH (ref 3.5–5.1)
Sodium: 135 mmol/L (ref 135–145)

## 2018-01-12 MED ORDER — HYDROMORPHONE HCL 1 MG/ML IJ SOLN
1.0000 mg | Freq: Once | INTRAMUSCULAR | Status: AC
  Administered 2018-01-12: 1 mg via INTRAVENOUS
  Filled 2018-01-12: qty 1

## 2018-01-12 MED ORDER — OXYCODONE HCL 5 MG PO TABS: 10.0000 mg | ORAL_TABLET | Freq: Three times a day (TID) | ORAL | 0 refills | Status: DC

## 2018-01-12 MED ORDER — SODIUM CHLORIDE 0.9 % IV BOLUS
1000.0000 mL | Freq: Once | INTRAVENOUS | Status: AC
  Administered 2018-01-12: 1000 mL via INTRAVENOUS

## 2018-01-12 MED ORDER — ONDANSETRON HCL 4 MG/2ML IJ SOLN
4.0000 mg | Freq: Once | INTRAMUSCULAR | Status: AC
  Administered 2018-01-12: 4 mg via INTRAVENOUS
  Filled 2018-01-12: qty 2

## 2018-01-12 NOTE — ED Notes (Signed)
Md at bedside to evaluate patient right foot. Recent amputation as per patient. Sutures intact, no redness aor drainage noted. Patient's IV hep lock d/c. Patient discharged to home.

## 2018-01-12 NOTE — ED Notes (Signed)
Unable to obtain IV access second also unable to obtain IV access. D aware IV team called .

## 2018-01-12 NOTE — ED Provider Notes (Addendum)
[facility] Emergency Department Provider Note  ____________________________________________   I have reviewed the triage vital signs and the nursing notes. Where available I have reviewed prior notes and, if possible and indicated, outside hospital notes.    HISTORY  Chief Complaint Sore Throat and Shortness of Breath    HPI Katie Guzman is a 47 y.o. 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  Patient is on chronic pain medication, OxyContin she states, and she ran out of it 2 days ago and is suffering from pain, also it hurts to swallow without her pain medications and she feels dehydrated.  She would like pain medications and she would like IV fluid.  She denies any change in her chronic condition regarding her oncologic process otherwise.    Past Medical History:  Diagnosis Date  . Asthma   . Cancer ([place])    lung  . Cellulitis 04/15/2015  . COPD (chronic obstructive pulmonary disease) ([place])   . Diabetes mellitus    Poorly controlled with complications  . Emphysema   . Fibromyalgia   . Neuropathy   . Tenosynovitis of foot 04/16/2015    Patient Active Problem List   Diagnosis Date Noted  . Diabetic foot infection ([place]) 03/27/2017  . Skin ulcer ([place]) 08/23/2016  . Diabetic foot ulcer ([place]) 08/23/2016  . Diabetic ulcer of toe of left foot associated with type 2 diabetes mellitus ([place])   . 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 Tenosynovitis of foot 04/16/2015  . Hypokalemia 04/16/2015  . Cellulitis 04/15/2015  . Diabetes mellitus ([place]) 04/15/2015  . Pain syndrome, chronic 04/15/2015  . COPD (chronic obstructive pulmonary disease) ([place]) 04/15/2015  . Diabetic neuropathy ([place])  04/15/2015  . Diabetes mellitus with complication ([place])   . COPD exacerbation ([place]) 10/19/2014    Past Surgical History:  Procedure Laterality Date  . BACK SURGERY    . CESAREAN SECTION    . TONSILLECTOMY    . TUBAL LIGATION      Prior to Admission medications   Medication Sig Start Date End Date Taking? Authorizing Provider  acetaminophen (TYLENOL) 325 MG tablet Take 650 mg by mouth every 6 (six) hours as needed for mild pain.    [provider]  albuterol (PROVENTIL HFA;VENTOLIN HFA) 108 (90 BASE) MCG/ACT inhaler Inhale 2 puffs into the lungs every 4 (four) hours as needed for wheezing or shortness of breath. For wheeze or shortness of breath 10/23/14   Gouru, Illene Silver, MD  albuterol (PROVENTIL) (2.5 MG/3ML) 0.083% nebulizer solution Take 2.5 mg by nebulization every 4 (four) hours as needed for wheezing or shortness of breath.     [provider]  elvitegravir-cobicistat-emtricitabine-tenofovir (GENVOYA) 150-150-200-10 MG TABS tablet Take 1 tablet by mouth daily with breakfast. 07/31/17   Lavonia Drafts, MD  elvitegravir-cobicistat-emtricitabine-tenofovir (GENVOYA) 150-150-200-10 MG TABS tablet Take 1 tablet by mouth daily with breakfast. 07/31/17   Lavonia Drafts, MD  fluticasone-salmeterol (ADVAIR HFA) 115-21 MCG/ACT inhaler Inhale 2 puffs into the lungs 2 (two) times daily. 10/23/14   Nicholes Mango, MD  gabapentin (NEURONTIN) 800 MG tablet Take 1 tablet (800 mg total) by mouth 3 (three) times daily. 04/01/17   Edrick Kins, DPM  ibuprofen (ADVIL,MOTRIN) 600 MG tablet Take 600 mg by mouth every 6 (  six) hours as needed for fever or mild pain.    [provider]  insulin glargine (LANTUS) 100 UNIT/ML injection Inject 0.25 mLs (25 Units total) into the skin 2 (two) times daily. Patient taking differently: Inject 56 Units into the skin at bedtime.  10/23/14   Gouru, Illene Silver, MD  insulin lispro (HUMALOG) 100 UNIT/ML injection Inject 1-18 Units into the skin 4 (four) times  daily -  before meals and at bedtime. Sliding scale  07/11/16 07/11/17  [provider]  lidocaine (LIDODERM) 5 % Place 1 patch onto the skin daily as needed. For pain. Remove & Discard patch within 12 hours or as directed by MD    [provider]  nicotine (NICODERM CQ - DOSED IN MG/24 HOURS) 21 mg/24hr patch Place 1 patch (21 mg total) onto the skin daily. Patient taking differently: Place 21 mg onto the skin daily as needed (smoking cessation).  10/23/14   Gouru, Illene Silver, MD  ondansetron (ZOFRAN ODT) 4 MG disintegrating tablet Take 1 tablet (4 mg total) by mouth every 8 (eight) hours as needed for nausea or vomiting. 12/08/15   Carrie Mew, MD  oxyCODONE (OXY IR/ROXICODONE) 5 MG immediate release tablet Take 10 mg by mouth 3 (three) times daily.    [provider]  tiotropium (SPIRIVA) 18 MCG inhalation capsule Place 1 capsule (18 mcg total) into inhaler and inhale daily. 10/23/14   Nicholes Mango, MD    Allergies Flexeril [cyclobenzaprine]; Hydrocodone; Other; Darvocet [propoxyphene n-acetaminophen]; Fish-derived products; Propofol; Toradol [ketorolac tromethamine]; Vicodin [hydrocodone-acetaminophen]; Ketorolac; and Tramadol  Family History  Problem Relation Age of Onset  . Lung cancer Mother     Social History Social History   Tobacco Use  . Smoking status: Current Some Day Smoker    Packs/day: 0.50    Years: 30.00    Pack years: 15.00    Types: Cigarettes    Last attempt to quit: 10/05/2014    Years since quitting: 3.2  . Smokeless tobacco: Never Used  Substance Use Topics  . Alcohol use: No  . Drug use: No    Review of Systems Constitutional: No fever/chills Eyes: No visual changes. ENT: No sore throat. No stiff neck no neck pain Cardiovascular: Denies chest pain. Respiratory: Denies shortness of breath. Gastrointestinal:   no vomiting.  No diarrhea.  No constipation. Genitourinary: Negative for dysuria. Musculoskeletal: Negative lower extremity  swelling Skin: Negative for rash. Neurological: Negative for severe headaches, focal weakness or numbness.   ____________________________________________   PHYSICAL EXAM:  VITAL SIGNS: ED Triage Vitals  Enc Vitals Group     BP 01/12/18 1409 (!) 149/90     Pulse Rate 01/12/18 1409 (!) 106     Resp 01/12/18 1409 (!) 24     Temp 01/12/18 1409 98.1 F (36.7 C)     Temp Source 01/12/18 1409 Oral     SpO2 01/12/18 1409 100 %     Weight 01/12/18 1407 140 lb (63.5 kg)     Height 01/12/18 1407 5\' 2"  (1.575 m)     Head Circumference --      Peak Flow --      Pain Score 01/12/18 1406 9     Pain Loc --      Pain Edu? --      Excl. in [place]? --     Constitutional: Alert and oriented. Well appearing and in no acute distress. Eyes: Conjunctivae are normal Head: Atraumatic HEENT: No congestion/rhinnorhea. Mucous membranes are moist.  Oropharynx non-erythematous speaks with  a slightly hoarse voice unclear if this is from chronic cigarette abuse.  No evidence of stridor or acute respiratory issue today Neck:   Nontender with no meningismus, no masses, no stridor Cardiovascular: Normal rate, regular rhythm. Grossly normal heart sounds.  Good peripheral circulation. Respiratory: Normal respiratory effort.  No retractions. Lungs CTAB. Abdominal: Soft and nontender. No distention. No guarding no rebound Back:  There is no focal tenderness or step off.  there is no midline tenderness there are no lesions noted. there is no CVA tenderness Musculoskeletal: No lower extremity tenderness, no upper extremity tenderness. No joint effusions, no DVT signs strong distal pulses no edema Neurologic:  Normal speech and language. No gross focal neurologic deficits are appreciated.  Skin:  Skin is warm, dry and intact.  Patient changes noted around her neck, none of it appears to be infected, there is evidence of a well-healed I&D on her hand, Psychiatric: Mood and affect are anxious. Speech and behavior are  normal.  ____________________________________________   LABS (all labs ordered are listed, but only abnormal results are displayed)  Labs Reviewed  BASIC METABOLIC PANEL - Abnormal; Notable for the following components:      Result Value   Potassium 5.4 (*)    Glucose, Bld 251 (*)    All other components within normal limits  CBC - Abnormal; Notable for the following components:   RBC 5.55 (*)    Hemoglobin 15.6 (*)    HCT 50.4 (*)    Platelets 460 (*)    All other components within normal limits    Pertinent labs  results that were available during my care of the patient were reviewed by me and considered in my medical decision making (see chart for details). ____________________________________________  EKG  I personally interpreted any EKGs ordered by me or triage  ____________________________________________  RADIOLOGY  Pertinent labs & imaging results that were available during my care of the patient were reviewed by me and considered in my medical decision making (see chart for details). If possible, patient and/or family made aware of any abnormal findings.  No results found. ____________________________________________    PROCEDURES  Procedure(s) performed: None  Procedures  Critical Care performed: None  ____________________________________________   INITIAL IMPRESSION / ASSESSMENT AND PLAN / ED COURSE  Pertinent labs & imaging results that were available during my care of the patient were reviewed by me and considered in my medical decision making (see chart for details).  Patient here with a need for IV fluid and pain medication in the context of an oncologic process.  There is no evidence of impending airway issue or overwhelming infection etc.  She does however appear to be somewhat dehydrated.  She states she ran out of her meds.  We are giving her IV fluids and IV pain medication.  She is not allergic to Dilaudid she states.  However, we were  unable to get a peripheral IV and patient refused to allow me to try an EJ, she wants to the IV team to come this is causing a delay in her care.  Otherwise however vital signs are reassuring and so is her presentation.  We will try to accommodate her needs as best we can  ----------------------------------------- 5:54 PM on 01/12/2018 -----------------------------------------  Review of the patient's [place] suggest she had 80 oxycodone prescription tablets, 5 mg, prescribed to her on 12/22/2017, as well as 14 will morphine sulfate tabs on 12/22/2017.  No other prescriptions have been filled since that time.  Is unclear why she stopped getting her regular prescriptions filled.  ----------------------------------------- 7:09 PM on 01/12/2018 -----------------------------------------  Patient states that she stopped getting her prescription filled because she does get on the hospital at [facility].  She states there is no other change in her condition and she does not want to be admitted to the hospital transfer to [facility] or receive any further care she feels 100% better after IV fluids and she would like to go home.  I do not see any other acute pathology, I have never seen this patient before but she assures me that her voice and all of the other cancer related pathologies are normal and she wants to leave.  Given her declining admission, and her feeling that she is at her baseline, we will discharge her.   Site of recent foot surgery and hand surgeries do not look infected. No redness no swelling not hot etc.   ____________________________________________   FINAL CLINICAL IMPRESSION(S) / ED DIAGNOSES  Final diagnoses:  None      This chart was dictated using voice recognition software.  Despite best efforts to proofread,  errors can occur which can change meaning.      Schuyler Amor, MD 01/12/18 1751    Schuyler Amor, MD 01/12/18 1755    Schuyler Amor, MD 01/12/18  1910    Schuyler Amor, MD 01/12/18 [phone]

## 2018-01-12 NOTE — ED Triage Notes (Signed)
Pt reports that she has throat cancer and is having a lot of throat pain and shortness of breath today - pt is on 2L O2 at all times - pt takes oxycodone 20mg  every 4 hours and it is not helping with the pain

## 2018-01-12 NOTE — Discharge Instructions (Addendum)
Return to the emergency room for any throat swelling, difficulty swallowing, fever, chills, any change in your baseline medical condition, or if you feel worse in any way.  Do not drink or drive while taking the pain medications.  Do not take the pain medications except as directed.  We are not able to give you a long course of pain medications, please call your primary care doctor first thing in the morning tomorrow to have your regular prescriptions filled.

## 2018-07-28 ENCOUNTER — Emergency Department: Payer: Medicaid Other

## 2018-07-28 ENCOUNTER — Other Ambulatory Visit: Payer: Self-pay

## 2018-07-28 ENCOUNTER — Inpatient Hospital Stay
Admission: EM | Admit: 2018-07-28 | Discharge: 2018-07-31 | DRG: 638 | Disposition: A | Discharge status: Home / Self Care | Payer: Medicaid Other | Attending: Internal Medicine | Admitting: Internal Medicine

## 2018-07-28 ENCOUNTER — Encounter: Payer: Self-pay | Admitting: Emergency Medicine

## 2018-07-28 DIAGNOSIS — Z885 Allergy status to narcotic agent status: Secondary | ICD-10-CM

## 2018-07-28 DIAGNOSIS — Z884 Allergy status to anesthetic agent status: Secondary | ICD-10-CM

## 2018-07-28 DIAGNOSIS — E13621 Other specified diabetes mellitus with foot ulcer: Secondary | ICD-10-CM

## 2018-07-28 DIAGNOSIS — Z794 Long term (current) use of insulin: Secondary | ICD-10-CM

## 2018-07-28 DIAGNOSIS — Z89429 Acquired absence of other toe(s), unspecified side: Secondary | ICD-10-CM

## 2018-07-28 DIAGNOSIS — M797 Fibromyalgia: Secondary | ICD-10-CM | POA: Diagnosis present

## 2018-07-28 DIAGNOSIS — Z801 Family history of malignant neoplasm of trachea, bronchus and lung: Secondary | ICD-10-CM

## 2018-07-28 DIAGNOSIS — L97512 Non-pressure chronic ulcer of other part of right foot with fat layer exposed: Secondary | ICD-10-CM | POA: Diagnosis present

## 2018-07-28 DIAGNOSIS — L97519 Non-pressure chronic ulcer of other part of right foot with unspecified severity: Secondary | ICD-10-CM | POA: Diagnosis present

## 2018-07-28 DIAGNOSIS — Z91013 Allergy to seafood: Secondary | ICD-10-CM

## 2018-07-28 DIAGNOSIS — E1140 Type 2 diabetes mellitus with diabetic neuropathy, unspecified: Secondary | ICD-10-CM | POA: Diagnosis present

## 2018-07-28 DIAGNOSIS — E08621 Diabetes mellitus due to underlying condition with foot ulcer: Secondary | ICD-10-CM | POA: Diagnosis present

## 2018-07-28 DIAGNOSIS — F17210 Nicotine dependence, cigarettes, uncomplicated: Secondary | ICD-10-CM | POA: Diagnosis present

## 2018-07-28 DIAGNOSIS — E11621 Type 2 diabetes mellitus with foot ulcer: Principal | ICD-10-CM | POA: Diagnosis present

## 2018-07-28 DIAGNOSIS — J439 Emphysema, unspecified: Secondary | ICD-10-CM | POA: Diagnosis present

## 2018-07-28 DIAGNOSIS — Z20828 Contact with and (suspected) exposure to other viral communicable diseases: Secondary | ICD-10-CM | POA: Diagnosis present

## 2018-07-28 DIAGNOSIS — Z79899 Other long term (current) drug therapy: Secondary | ICD-10-CM

## 2018-07-28 DIAGNOSIS — L03818 Cellulitis of other sites: Secondary | ICD-10-CM

## 2018-07-28 DIAGNOSIS — L03115 Cellulitis of right lower limb: Secondary | ICD-10-CM | POA: Diagnosis present

## 2018-07-28 LAB — COMPREHENSIVE METABOLIC PANEL
ALT: 7 U/L (ref 0–44)
AST: 11 U/L — ABNORMAL LOW (ref 15–41)
Albumin: 3.6 g/dL (ref 3.5–5.0)
Alkaline Phosphatase: 85 U/L (ref 38–126)
Anion gap: 11 (ref 5–15)
BUN: 22 mg/dL — ABNORMAL HIGH (ref 6–20)
CO2: 29 mmol/L (ref 22–32)
Calcium: 9.5 mg/dL (ref 8.9–10.3)
Chloride: 96 mmol/L — ABNORMAL LOW (ref 98–111)
Creatinine, Ser: 0.88 mg/dL (ref 0.44–1.00)
GFR calc Af Amer: >60 mL/min (ref >60)
GFR calc non Af Amer: >60 mL/min (ref >60)
Glucose, Bld: 218 mg/dL — ABNORMAL HIGH (ref 70–99)
Potassium: 4 mmol/L (ref 3.5–5.1)
Sodium: 136 mmol/L (ref 135–145)
Total Bilirubin: 0.4 mg/dL (ref 0.3–1.2)
Total Protein: 7.6 g/dL (ref 6.5–8.1)

## 2018-07-28 LAB — CBC WITH DIFFERENTIAL/PLATELET
Abs Immature Granulocytes: 0.04 10*3/uL (ref 0.00–0.07)
Basophils Absolute: 0.1 10*3/uL (ref 0.0–0.1)
Basophils Relative: 0 %
Eosinophils Absolute: 0.2 10*3/uL (ref 0.0–0.5)
Eosinophils Relative: 1 %
HCT: 43.1 % (ref 36.0–46.0)
Hemoglobin: 13.9 g/dL (ref 12.0–15.0)
Immature Granulocytes: 0 %
Lymphocytes Relative: 6 %
Lymphs Abs: 0.7 10*3/uL (ref 0.7–4.0)
MCH: 27.8 pg (ref 26.0–34.0)
MCHC: 32.3 g/dL (ref 30.0–36.0)
MCV: 86.2 fL (ref 80.0–100.0)
Monocytes Absolute: 0.7 10*3/uL (ref 0.1–1.0)
Monocytes Relative: 6 %
Neutro Abs: 10.6 10*3/uL — ABNORMAL HIGH (ref 1.7–7.7)
Neutrophils Relative %: 87 %
Platelets: 431 10*3/uL — ABNORMAL HIGH (ref 150–400)
RBC: 5 MIL/uL (ref 3.87–5.11)
RDW: 14.6 % (ref 11.5–15.5)
WBC: 12.2 10*3/uL — ABNORMAL HIGH (ref 4.0–10.5)
nRBC: 0 % (ref 0.0–0.2)

## 2018-07-28 LAB — LACTIC ACID, PLASMA: Lactic Acid, Venous: 0.8 mmol/L (ref 0.5–1.9)

## 2018-07-28 LAB — SARS CORONAVIRUS 2 BY RT PCR (HOSPITAL ORDER, PERFORMED IN ~~LOC~~ HOSPITAL LAB): SARS Coronavirus 2: NEGATIVE

## 2018-07-28 MED ORDER — HYDROMORPHONE HCL 1 MG/ML IJ SOLN
0.5000 mg | Freq: Once | INTRAMUSCULAR | Status: AC
  Administered 2018-07-28: 0.5 mg via INTRAVENOUS
  Filled 2018-07-28: qty 1

## 2018-07-28 MED ORDER — ONDANSETRON HCL 4 MG/2ML IJ SOLN
4.0000 mg | Freq: Once | INTRAMUSCULAR | Status: AC
  Administered 2018-07-28: 21:00:00 4 mg via INTRAVENOUS
  Filled 2018-07-28: qty 2

## 2018-07-28 MED ORDER — SODIUM CHLORIDE 0.9% FLUSH
3.0000 mL | Freq: Once | INTRAVENOUS | Status: AC
  Administered 2018-07-28: 22:00:00 3 mL via INTRAVENOUS

## 2018-07-28 MED ORDER — VANCOMYCIN HCL IN DEXTROSE 1-5 GM/200ML-% IV SOLN
1000.0000 mg | Freq: Once | INTRAVENOUS | Status: AC
  Administered 2018-07-28: 22:00:00 1000 mg via INTRAVENOUS
  Filled 2018-07-28: qty 200

## 2018-07-28 MED ORDER — PIPERACILLIN-TAZOBACTAM 3.375 G IVPB 30 MIN
3.3750 g | Freq: Once | INTRAVENOUS | Status: AC
  Administered 2018-07-28: 22:00:00 3.375 g via INTRAVENOUS
  Filled 2018-07-28: qty 50

## 2018-07-28 NOTE — ED Triage Notes (Signed)
C/O right foot swelling, redness and pain to ball of foot x 3 days.  Patient states she thinks she has osteomyelitis.  Patient has throat Ca, and last radiation one month abo.

## 2018-07-28 NOTE — ED Provider Notes (Signed)
[facility] Emergency Department Provider Note   ____________________________________________   First MD Initiated Contact with Patient 07/28/18 2053     (approximate)  I have reviewed the triage vital signs and the nursing notes.   HISTORY  Chief Complaint Foot Swelling    HPI Katie Guzman is a 48 y.o. female patient complains of swelling and pain in her right foot.  She has a diabetic foot ulcer.  Pain is getting worse and going up above her ankle.  Pain is moderately severe made worse by palpation or putting her foot down         Past Medical History:  Diagnosis Date  . Asthma   . Cancer ([place])    lung  . Cellulitis 04/15/2015  . COPD (chronic obstructive pulmonary disease) ([place])   . Diabetes mellitus    Poorly controlled with complications  . Emphysema   . Fibromyalgia   . Neuropathy   . Tenosynovitis of foot 04/16/2015    Patient Active Problem List   Diagnosis Date Noted  . Diabetic foot infection ([place]) 03/27/2017  . Skin ulcer ([place]) 08/23/2016  . Diabetic foot ulcer ([place]) 08/23/2016  . Diabetic ulcer of toe of left foot associated with type 2 diabetes mellitus ([place])   . 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 Tenosynovitis of foot 04/16/2015  . Hypokalemia 04/16/2015  . Cellulitis 04/15/2015  . Diabetes mellitus ([place]) 04/15/2015  . Pain syndrome, chronic 04/15/2015  . COPD (chronic obstructive pulmonary disease) ([place]) 04/15/2015  . Diabetic neuropathy ([place]) 04/15/2015  . Diabetes mellitus with complication ([place])   . COPD exacerbation ([place]) 10/19/2014    Past Surgical History:  Procedure Laterality Date  . BACK SURGERY    . CESAREAN SECTION    . TONSILLECTOMY    . TUBAL LIGATION      Prior to Admission medications   Medication Sig Start Date End Date Taking? Authorizing Provider  acetaminophen (TYLENOL) 325 MG tablet Take 650 mg by mouth every 6 (six) hours as needed for mild pain.     [provider]  albuterol (PROVENTIL HFA;VENTOLIN HFA) 108 (90 BASE) MCG/ACT inhaler Inhale 2 puffs into the lungs every 4 (four) hours as needed for wheezing or shortness of breath. For wheeze or shortness of breath 10/23/14   Gouru, Illene Silver, MD  albuterol (PROVENTIL) (2.5 MG/3ML) 0.083% nebulizer solution Take 2.5 mg by nebulization every 4 (four) hours as needed for wheezing or shortness of breath.     [provider]  elvitegravir-cobicistat-emtricitabine-tenofovir (GENVOYA) 150-150-200-10 MG TABS tablet Take 1 tablet by mouth daily with breakfast. Patient not taking: Reported on 01/12/2018 07/31/17   Lavonia Drafts, MD  elvitegravir-cobicistat-emtricitabine-tenofovir (GENVOYA) 150-150-200-10 MG TABS tablet Take 1 tablet by mouth daily with breakfast. Patient not taking: Reported on 01/12/2018 07/31/17   Lavonia Drafts, MD  fluticasone-salmeterol (ADVAIR HFA) [phone] MCG/ACT inhaler Inhale 2 puffs into the lungs 2 (two) times daily. 10/23/14   Nicholes Mango, MD  gabapentin (NEURONTIN) 800 MG tablet Take 1 tablet (800 mg total) by mouth 3 (three) times daily. 04/01/17   Edrick Kins, DPM  ibuprofen (ADVIL,MOTRIN) 600 MG tablet Take 600 mg by mouth every 6 (six) hours as needed for fever or mild pain.    [provider]  insulin glargine (LANTUS) 100 UNIT/ML injection Inject 0.25 mLs (25 Units total) into the skin 2 (two) times daily. Patient taking differently: Inject 56 Units into the skin at bedtime.  10/23/14   Gouru, Illene Silver,  MD  insulin lispro (HUMALOG) 100 UNIT/ML injection Inject 1-18 Units into the skin 4 (four) times daily -  before meals and at bedtime. Sliding scale  07/11/16 07/11/17  [provider]  lidocaine (LIDODERM) 5 % Place 1 patch onto the skin daily as needed. For pain. Remove & Discard patch within 12 hours or as directed by MD    [provider]  nicotine (NICODERM CQ - DOSED IN MG/24 HOURS) 21 mg/24hr patch Place 1 patch (21 mg total) onto the  skin daily. Patient taking differently: Place 21 mg onto the skin daily as needed (smoking cessation).  10/23/14   Gouru, Illene Silver, MD  ondansetron (ZOFRAN ODT) 4 MG disintegrating tablet Take 1 tablet (4 mg total) by mouth every 8 (eight) hours as needed for nausea or vomiting. 12/08/15   Carrie Mew, MD  oxyCODONE (OXY IR/ROXICODONE) 5 MG immediate release tablet Take 2 tablets (10 mg total) by mouth 3 (three) times daily. 01/12/18   Schuyler Amor, MD  tiotropium (SPIRIVA) 18 MCG inhalation capsule Place 1 capsule (18 mcg total) into inhaler and inhale daily. 10/23/14   Nicholes Mango, MD    Allergies Flexeril [cyclobenzaprine], Hydrocodone, Other, Darvocet [propoxyphene n-acetaminophen], Fish-derived products, Propofol, Toradol [ketorolac tromethamine], Vicodin [hydrocodone-acetaminophen], Ketorolac, and Tramadol  Family History  Problem Relation Age of Onset  . Lung cancer Mother     Social History Social History   Tobacco Use  . Smoking status: Current Some Day Smoker    Packs/day: 0.50    Years: 30.00    Pack years: 15.00    Types: Cigarettes    Last attempt to quit: 10/05/2014    Years since quitting: 3.8  . Smokeless tobacco: Never Used  Substance Use Topics  . Alcohol use: No  . Drug use: No    Review of Systems  Constitutional: No fever/chills Eyes: No visual changes. ENT: No sore throat. Cardiovascular: Denies chest pain. Respiratory: Denies shortness of breath. Gastrointestinal: No abdominal pain.  No nausea, no vomiting.  No diarrhea.  No constipation. Genitourinary: Negative for dysuria. Musculoskeletal: Negative for back pain. Skin: Negative for rash. Neurological: Negative for headaches, focal weakness    ____________________________________________   PHYSICAL EXAM:  VITAL SIGNS: ED Triage Vitals  Enc Vitals Group     BP 07/28/18 1658 (!) 148/82     Pulse Rate 07/28/18 1658 95     Resp 07/28/18 1658 16     Temp 07/28/18 1658 98.7 F (37.1 C)      Temp Source 07/28/18 1658 Oral     SpO2 07/28/18 1658 96 %     Weight 07/28/18 1655 115 lb (52.2 kg)     Height 07/28/18 1655 5' (1.524 m)     Head Circumference --      Peak Flow --      Pain Score 07/28/18 1654 10     Pain Loc --      Pain Edu? --      Excl. in [place]? --     Constitutional: Alert and oriented.  Looks chronically ill and in pain Eyes: Conjunctivae are normal.  Head: Atraumatic. Nose: No congestion/rhinnorhea. Mouth/Throat: Mucous membranes are moist.  Oropharynx non-erythematous. Neck: No stridor.   Cardiovascular: Normal rate, regular rhythm. Grossly normal heart sounds.  Good peripheral circulation. Respiratory: Normal respiratory effort.  No retractions. Lungs CTAB. Gastrointestinal: Soft and nontender. No distention. No abdominal bruits. No CVA tenderness. Musculoskeletal: Left leg is normal no edema or pain right leg there is redness and  swelling of the forefoot especially around the great toe MCP which has 2 ulcers which are draining and the redness does go up above the ankle. Neurologic:  Normal speech and language. No gross focal neurologic deficits are appreciated.  Skin:  Skin is warm, dry and intact except for as noted in above musculoskeletal. No rash noted.   ____________________________________________   LABS (all labs ordered are listed, but only abnormal results are displayed)  Labs Reviewed  COMPREHENSIVE METABOLIC PANEL - Abnormal; Notable for the following components:      Result Value   Chloride 96 (*)    Glucose, Bld 218 (*)    BUN 22 (*)    AST 11 (*)    All other components within normal limits  CBC WITH DIFFERENTIAL/PLATELET - Abnormal; Notable for the following components:   WBC 12.2 (*)    Platelets 431 (*)    Neutro Abs 10.6 (*)    All other components within normal limits  SARS CORONAVIRUS 2 (HOSPITAL ORDER, [facility])  LACTIC ACID, PLASMA  LACTIC ACID, PLASMA  URINALYSIS, COMPLETE (UACMP) WITH  MICROSCOPIC  POC URINE PREG, ED   ____________________________________________  EKG   ____________________________________________  RADIOLOGY  ED MD interpretation: X-ray read by radiology reviewed by me shows no osteomyelitis chest x-ray read by radiology reviewed by me shows no active cardiopulmonary disease  Official radiology report(s): Dg Chest 2 View  Result Date: 07/28/2018 CLINICAL DATA:  Right foot redness EXAM: CHEST - 2 VIEW COMPARISON:  11/18/2014 FINDINGS: The heart size and mediastinal contours are within normal limits. Both lungs are clear. The visualized skeletal structures are unremarkable. IMPRESSION: No active cardiopulmonary disease. Electronically Signed   By: Donavan Foil M.D.   On: 07/28/2018 18:10   Dg Foot Complete Right  Result Date: 07/28/2018 CLINICAL DATA:  Right foot redness EXAM: RIGHT FOOT COMPLETE - 3+ VIEW COMPARISON:  07/29/2015 FINDINGS: No acute displaced fracture or malalignment. Small plantar calcaneal spur. Old fracture deformity of the fifth metatarsal with cystic lucency in the distal shaft. Plantar soft tissue swelling with ulceration at the level of the MTP joints. IMPRESSION: 1. No acute osseous abnormality 2. Plantar soft tissue swelling with ulceration 3. Chronic deformity fifth metatarsal Electronically Signed   By: Donavan Foil M.D.   On: 07/28/2018 18:12    ____________________________________________   PROCEDURES  Procedure(s) performed (including Critical Care):  Procedures   ____________________________________________   INITIAL IMPRESSION / ASSESSMENT AND PLAN / ED COURSE  Patient has an infected diabetic foot ulcer.  We will have to get her in the hospital for treatment.  We will have to get her tested for COVID first.  She says she needs something for pain and she has been able to tolerate Dilaudid before without problems.              ____________________________________________   FINAL CLINICAL  IMPRESSION(S) / ED DIAGNOSES  Final diagnoses:  Diabetic ulcer of right foot associated with diabetes mellitus of other type, with fat layer exposed, unspecified part of foot ([place])  Cellulitis of other specified site     ED Discharge Orders    None       Note:  This document was prepared using Dragon voice recognition software and may include unintentional dictation errors.    Nena Polio, MD 07/28/18 2105

## 2018-07-28 NOTE — ED Notes (Signed)
Patient presented to the ED with a Diabetic foot ulcer on her right foot. Site is approximately  A centimeter wide and a centimeter long. Foot is red.  Patient reports pain as a 10/10.

## 2018-07-29 DIAGNOSIS — M797 Fibromyalgia: Secondary | ICD-10-CM | POA: Diagnosis present

## 2018-07-29 DIAGNOSIS — L97512 Non-pressure chronic ulcer of other part of right foot with fat layer exposed: Secondary | ICD-10-CM | POA: Diagnosis present

## 2018-07-29 DIAGNOSIS — F17210 Nicotine dependence, cigarettes, uncomplicated: Secondary | ICD-10-CM | POA: Diagnosis present

## 2018-07-29 DIAGNOSIS — E11621 Type 2 diabetes mellitus with foot ulcer: Secondary | ICD-10-CM | POA: Diagnosis present

## 2018-07-29 DIAGNOSIS — Z89429 Acquired absence of other toe(s), unspecified side: Secondary | ICD-10-CM | POA: Diagnosis not present

## 2018-07-29 DIAGNOSIS — L97519 Non-pressure chronic ulcer of other part of right foot with unspecified severity: Secondary | ICD-10-CM | POA: Diagnosis present

## 2018-07-29 DIAGNOSIS — L03818 Cellulitis of other sites: Secondary | ICD-10-CM | POA: Diagnosis present

## 2018-07-29 DIAGNOSIS — Z79899 Other long term (current) drug therapy: Secondary | ICD-10-CM | POA: Diagnosis not present

## 2018-07-29 DIAGNOSIS — Z20828 Contact with and (suspected) exposure to other viral communicable diseases: Secondary | ICD-10-CM | POA: Diagnosis present

## 2018-07-29 DIAGNOSIS — Z885 Allergy status to narcotic agent status: Secondary | ICD-10-CM | POA: Diagnosis not present

## 2018-07-29 DIAGNOSIS — Z801 Family history of malignant neoplasm of trachea, bronchus and lung: Secondary | ICD-10-CM | POA: Diagnosis not present

## 2018-07-29 DIAGNOSIS — E08621 Diabetes mellitus due to underlying condition with foot ulcer: Secondary | ICD-10-CM | POA: Diagnosis present

## 2018-07-29 DIAGNOSIS — E1140 Type 2 diabetes mellitus with diabetic neuropathy, unspecified: Secondary | ICD-10-CM | POA: Diagnosis present

## 2018-07-29 DIAGNOSIS — Z794 Long term (current) use of insulin: Secondary | ICD-10-CM | POA: Diagnosis not present

## 2018-07-29 DIAGNOSIS — J439 Emphysema, unspecified: Secondary | ICD-10-CM | POA: Diagnosis present

## 2018-07-29 DIAGNOSIS — Z884 Allergy status to anesthetic agent status: Secondary | ICD-10-CM | POA: Diagnosis not present

## 2018-07-29 DIAGNOSIS — L03115 Cellulitis of right lower limb: Secondary | ICD-10-CM | POA: Diagnosis present

## 2018-07-29 DIAGNOSIS — Z91013 Allergy to seafood: Secondary | ICD-10-CM | POA: Diagnosis not present

## 2018-07-29 LAB — URINALYSIS, COMPLETE (UACMP) WITH MICROSCOPIC
Bilirubin Urine: NEGATIVE
Glucose, UA: >=500 mg/dL — AB
Hgb urine dipstick: NEGATIVE
Ketones, ur: NEGATIVE mg/dL
Nitrite: NEGATIVE
Protein, ur: NEGATIVE mg/dL
Specific Gravity, Urine: 1.018 (ref 1.005–1.030)
pH: 6 (ref 5.0–8.0)

## 2018-07-29 LAB — CBC
HCT: 44 % (ref 36.0–46.0)
Hemoglobin: 14.2 g/dL (ref 12.0–15.0)
MCH: 27.7 pg (ref 26.0–34.0)
MCHC: 32.3 g/dL (ref 30.0–36.0)
MCV: 85.9 fL (ref 80.0–100.0)
Platelets: 427 10*3/uL — ABNORMAL HIGH (ref 150–400)
RBC: 5.12 MIL/uL — ABNORMAL HIGH (ref 3.87–5.11)
RDW: 14.5 % (ref 11.5–15.5)
WBC: 10.8 10*3/uL — ABNORMAL HIGH (ref 4.0–10.5)
nRBC: 0 % (ref 0.0–0.2)

## 2018-07-29 LAB — BASIC METABOLIC PANEL
Anion gap: 10 (ref 5–15)
BUN: 20 mg/dL (ref 6–20)
CO2: 30 mmol/L (ref 22–32)
Calcium: 9.1 mg/dL (ref 8.9–10.3)
Chloride: 96 mmol/L — ABNORMAL LOW (ref 98–111)
Creatinine, Ser: 0.9 mg/dL (ref 0.44–1.00)
GFR calc Af Amer: >60 mL/min (ref >60)
GFR calc non Af Amer: >60 mL/min (ref >60)
Glucose, Bld: 337 mg/dL — ABNORMAL HIGH (ref 70–99)
Potassium: 3.7 mmol/L (ref 3.5–5.1)
Sodium: 136 mmol/L (ref 135–145)

## 2018-07-29 LAB — GLUCOSE, CAPILLARY
Glucose-Capillary: 347 mg/dL — ABNORMAL HIGH (ref 70–99)
Glucose-Capillary: 82 mg/dL (ref 70–99)
Glucose-Capillary: 245 mg/dL — ABNORMAL HIGH (ref 70–99)
Glucose-Capillary: 209 mg/dL — ABNORMAL HIGH (ref 70–99)
Glucose-Capillary: 289 mg/dL — ABNORMAL HIGH (ref 70–99)

## 2018-07-29 LAB — LACTIC ACID, PLASMA: Lactic Acid, Venous: 1.4 mmol/L (ref 0.5–1.9)

## 2018-07-29 LAB — TSH: TSH: 1.272 u[IU]/mL (ref 0.350–4.500)

## 2018-07-29 LAB — HEMOGLOBIN A1C
Hgb A1c MFr Bld: 9.5 % — ABNORMAL HIGH (ref 4.8–5.6)
Mean Plasma Glucose: 225.95 mg/dL

## 2018-07-29 MED ORDER — HYDROMORPHONE HCL 1 MG/ML IJ SOLN
0.5000 mg | INTRAMUSCULAR | Status: DC | PRN
  Administered 2018-07-29 – 2018-07-31 (×5): 1 mg via INTRAVENOUS
  Filled 2018-07-29 (×7): qty 1

## 2018-07-29 MED ORDER — ACETAMINOPHEN 325 MG PO TABS
650.0000 mg | ORAL_TABLET | Freq: Four times a day (QID) | ORAL | Status: DC | PRN
  Administered 2018-07-29: 03:00:00 650 mg via ORAL
  Filled 2018-07-29: qty 2

## 2018-07-29 MED ORDER — INSULIN GLARGINE 100 UNIT/ML ~~LOC~~ SOLN
18.0000 [IU] | Freq: Every day | SUBCUTANEOUS | Status: DC
  Administered 2018-07-29 – 2018-07-30 (×2): 18 [IU] via SUBCUTANEOUS
  Filled 2018-07-29 (×3): qty 0.18

## 2018-07-29 MED ORDER — ONDANSETRON HCL 4 MG/2ML IJ SOLN: 4.0000 mg | Freq: Four times a day (QID) | INTRAMUSCULAR | Status: DC | PRN

## 2018-07-29 MED ORDER — VANCOMYCIN HCL IN DEXTROSE 1-5 GM/200ML-% IV SOLN
1000.0000 mg | INTRAVENOUS | Status: DC
  Administered 2018-07-29 – 2018-07-30 (×2): 1000 mg via INTRAVENOUS
  Filled 2018-07-29 (×3): qty 200

## 2018-07-29 MED ORDER — INSULIN ASPART 100 UNIT/ML ~~LOC~~ SOLN
12.0000 [IU] | Freq: Once | SUBCUTANEOUS | Status: DC
  Filled 2018-07-29: qty 1

## 2018-07-29 MED ORDER — MORPHINE SULFATE (PF) 2 MG/ML IV SOLN
2.0000 mg | Freq: Once | INTRAVENOUS | Status: AC
  Administered 2018-07-29: 01:00:00 2 mg via INTRAVENOUS
  Filled 2018-07-29: qty 1

## 2018-07-29 MED ORDER — ENOXAPARIN SODIUM 40 MG/0.4ML ~~LOC~~ SOLN
40.0000 mg | SUBCUTANEOUS | Status: DC
  Administered 2018-07-29 – 2018-07-31 (×3): 40 mg via SUBCUTANEOUS
  Filled 2018-07-29 (×3): qty 0.4

## 2018-07-29 MED ORDER — GABAPENTIN 400 MG PO CAPS
800.0000 mg | ORAL_CAPSULE | Freq: Three times a day (TID) | ORAL | Status: DC
  Administered 2018-07-29 – 2018-07-31 (×7): 800 mg via ORAL
  Filled 2018-07-29 (×4): qty 2
  Filled 2018-07-29: qty 8
  Filled 2018-07-29: qty 2
  Filled 2018-07-29: qty 8
  Filled 2018-07-29 (×3): qty 2
  Filled 2018-07-29: qty 8
  Filled 2018-07-29: qty 2

## 2018-07-29 MED ORDER — NICOTINE 21 MG/24HR TD PT24
21.0000 mg | MEDICATED_PATCH | Freq: Every day | TRANSDERMAL | Status: DC
  Administered 2018-07-29 – 2018-07-30 (×2): 21 mg via TRANSDERMAL
  Filled 2018-07-29 (×3): qty 1

## 2018-07-29 MED ORDER — INSULIN ASPART 100 UNIT/ML ~~LOC~~ SOLN
11.0000 [IU] | Freq: Once | SUBCUTANEOUS | Status: AC
  Administered 2018-07-29: 04:00:00 11 [IU] via SUBCUTANEOUS

## 2018-07-29 MED ORDER — VANCOMYCIN HCL IN DEXTROSE 1-5 GM/200ML-% IV SOLN: 1000.0000 mg | Freq: Once | INTRAVENOUS | Status: DC

## 2018-07-29 MED ORDER — POLYETHYLENE GLYCOL 3350 17 G PO PACK: 17.0000 g | PACK | Freq: Every day | ORAL | Status: DC | PRN

## 2018-07-29 MED ORDER — SODIUM CHLORIDE 0.9% FLUSH
3.0000 mL | Freq: Two times a day (BID) | INTRAVENOUS | Status: DC
  Administered 2018-07-29 – 2018-07-30 (×5): 3 mL via INTRAVENOUS

## 2018-07-29 MED ORDER — TIOTROPIUM BROMIDE MONOHYDRATE 18 MCG IN CAPS
18.0000 ug | ORAL_CAPSULE | Freq: Every day | RESPIRATORY_TRACT | Status: DC
  Administered 2018-07-29 – 2018-07-31 (×3): 18 ug via RESPIRATORY_TRACT
  Filled 2018-07-29: qty 5

## 2018-07-29 MED ORDER — MOMETASONE FURO-FORMOTEROL FUM 200-5 MCG/ACT IN AERO
2.0000 | INHALATION_SPRAY | Freq: Two times a day (BID) | RESPIRATORY_TRACT | Status: DC
  Administered 2018-07-29 – 2018-07-31 (×5): 2 via RESPIRATORY_TRACT
  Filled 2018-07-29: qty 8.8

## 2018-07-29 MED ORDER — VITAMIN C 500 MG PO TABS
250.0000 mg | ORAL_TABLET | Freq: Two times a day (BID) | ORAL | Status: DC
  Administered 2018-07-29 – 2018-07-31 (×5): 250 mg via ORAL
  Filled 2018-07-29 (×5): qty 1

## 2018-07-29 MED ORDER — INSULIN ASPART 100 UNIT/ML ~~LOC~~ SOLN
0.0000 [IU] | Freq: Every day | SUBCUTANEOUS | Status: DC
  Administered 2018-07-29: 23:00:00 3 [IU] via SUBCUTANEOUS
  Filled 2018-07-29: qty 1

## 2018-07-29 MED ORDER — SODIUM CHLORIDE 0.9 % IV SOLN: 250.0000 mL | INTRAVENOUS | Status: DC | PRN

## 2018-07-29 MED ORDER — IPRATROPIUM-ALBUTEROL 0.5-2.5 (3) MG/3ML IN SOLN: 3.0000 mL | Freq: Four times a day (QID) | RESPIRATORY_TRACT | Status: DC | PRN

## 2018-07-29 MED ORDER — INSULIN ASPART 100 UNIT/ML ~~LOC~~ SOLN
0.0000 [IU] | Freq: Three times a day (TID) | SUBCUTANEOUS | Status: DC
  Administered 2018-07-29 (×2): 5 [IU] via SUBCUTANEOUS
  Administered 2018-07-30: 8 [IU] via SUBCUTANEOUS
  Administered 2018-07-30: 5 [IU] via SUBCUTANEOUS
  Administered 2018-07-30: 3 [IU] via SUBCUTANEOUS
  Filled 2018-07-29 (×4): qty 1

## 2018-07-29 MED ORDER — ACETAMINOPHEN 650 MG RE SUPP: 650.0000 mg | Freq: Four times a day (QID) | RECTAL | Status: DC | PRN

## 2018-07-29 MED ORDER — ONDANSETRON HCL 4 MG PO TABS: 4.0000 mg | ORAL_TABLET | Freq: Four times a day (QID) | ORAL | Status: DC | PRN

## 2018-07-29 MED ORDER — OCUVITE-LUTEIN PO CAPS
1.0000 | ORAL_CAPSULE | Freq: Every day | ORAL | Status: DC
  Administered 2018-07-29 – 2018-07-31 (×3): 1 via ORAL
  Filled 2018-07-29 (×3): qty 1

## 2018-07-29 MED ORDER — MORPHINE SULFATE (PF) 2 MG/ML IV SOLN
2.0000 mg | INTRAVENOUS | Status: DC | PRN
  Administered 2018-07-29 – 2018-07-31 (×8): 2 mg via INTRAVENOUS
  Filled 2018-07-29 (×7): qty 1

## 2018-07-29 MED ORDER — SODIUM CHLORIDE 0.9% FLUSH: 3.0000 mL | INTRAVENOUS | Status: DC | PRN

## 2018-07-29 MED ORDER — PRO-STAT SUGAR FREE PO LIQD
30.0000 mL | Freq: Two times a day (BID) | ORAL | Status: DC
  Administered 2018-07-29 – 2018-07-30 (×2): 30 mL via ORAL

## 2018-07-29 NOTE — H&P (Signed)
[facility] [place] NAME: Katie Guzman    MR#:  [id number]  DATE OF BIRTH:  [birth year]  DATE OF ADMISSION:  07/28/2018  PRIMARY CARE PHYSICIAN: System, Pcp Not In   REQUESTING/REFERRING PHYSICIAN: Albertha Ghee, MD  CHIEF COMPLAINT:   Chief Complaint  Patient presents with   Foot Swelling    HISTORY OF PRESENT ILLNESS:  Katie Guzman  is a 48 y.o. female with a known history of poorly controlled diabetes mellitus, osteomyelitis with left great toe amputation, COPD, tobacco abuse, history of throat cancer.  She presented to the emergency room complaining of increased edema and erythema surrounding her right great toe and plantar surface wound.  Patient reports a 28-month history of increasing pain and intermittent drainage from the right plantar surface wound.  However over the last week she has noticed increased erythema and edema surrounding the area as well as purulent drainage.  She noticed a new area of purulent drainage on the left lateral side of her right great toe this a.m. with increased pain.  Pain is made considerably worse with attempting to ambulate.  Patient has noted fevers and chills subjectively.  She denies nausea, vomiting, diarrhea.  She denies abdominal pain.  She denies shortness of breath or chest pain.  However, patient continues to smoke 1/2 pack cigarettes per day.  X-ray of right foot demonstrates soft tissue edema.  Lower extremity venous ultrasounds completed with no evidence of DVT.  Elevated WBC 12.2.  Admitted to the hospitalist service for further management.  PAST MEDICAL HISTORY:   Past Medical History:  Diagnosis Date   Asthma    Cancer ([place])    lung   Cellulitis 04/15/2015   COPD (chronic obstructive pulmonary disease) ([facility])    Diabetes mellitus    Poorly controlled with complications   Emphysema    Fibromyalgia    Neuropathy    Tenosynovitis of foot 04/16/2015    PAST SURGICAL HISTORY:     Past Surgical History:  Procedure Laterality Date   BACK SURGERY     CESAREAN SECTION     TONSILLECTOMY     TUBAL LIGATION      SOCIAL HISTORY:   Social History   Tobacco Use   Smoking status: Current Some Day Smoker    Packs/day: 0.50    Years: 30.00    Pack years: 15.00    Types: Cigarettes    Last attempt to quit: 10/05/2014    Years since quitting: 3.8   Smokeless tobacco: Never Used  Substance Use Topics   Alcohol use: No    FAMILY HISTORY:   Family History  Problem Relation Age of Onset   Lung cancer Mother     DRUG ALLERGIES:   Allergies  Allergen Reactions   Flexeril [Cyclobenzaprine] Hives and Nausea And Vomiting   Hydrocodone Hives   Other Hives   Darvocet [Propoxyphene N-Acetaminophen] Hives and Nausea And Vomiting   Fish-Derived Products Other (See Comments)    Patient does not like to eat fish. Not allergic but prefers not to.   Propofol Nausea And Vomiting   Toradol [Ketorolac Tromethamine] Nausea And Vomiting   Vicodin [Hydrocodone-Acetaminophen] Hives and Nausea And Vomiting   Ketorolac Nausea And Vomiting   Tramadol Nausea And Vomiting    Vomiting only    REVIEW OF SYSTEMS:   Review of Systems  Constitutional: Positive for chills, fever and malaise/fatigue. Negative for diaphoresis.  HENT: Negative for congestion, ear pain, sinus pain, sore  throat and tinnitus.        Poor voice quality as a result of radiation treatment for throat cancer  Respiratory: Positive for cough, sputum production, shortness of breath and wheezing.   Cardiovascular: Negative for chest pain, palpitations and leg swelling.  Gastrointestinal: Negative for abdominal pain, constipation, diarrhea, heartburn, nausea and vomiting.  Genitourinary: Negative for dysuria, flank pain, frequency, hematuria and urgency.  Musculoskeletal: Negative for falls, joint pain and myalgias.  Skin: Negative for itching and rash.  Neurological: Negative for dizziness,  tremors, seizures, loss of consciousness, weakness and headaches.  Psychiatric/Behavioral: Negative.    A MEDICATIONS AT HOME:   Prior to Admission medications   Medication Sig Start Date End Date Taking? Authorizing Provider  acetaminophen (TYLENOL) 325 MG tablet Take 650 mg by mouth every 6 (six) hours as needed for mild pain.   Yes [provider]  albuterol (PROVENTIL HFA;VENTOLIN HFA) 108 (90 BASE) MCG/ACT inhaler Inhale 2 puffs into the lungs every 4 (four) hours as needed for wheezing or shortness of breath. For wheeze or shortness of breath Patient taking differently: Inhale 2 puffs into the lungs 2 (two) times a day. For wheeze or shortness of breath 10/23/14  Yes Gouru, Aruna, MD  albuterol (PROVENTIL) (2.5 MG/3ML) 0.083% nebulizer solution Take 2.5 mg by nebulization every 4 (four) hours as needed for wheezing or shortness of breath.    Yes [provider]  fluticasone-salmeterol (ADVAIR HFA) 115-21 MCG/ACT inhaler Inhale 2 puffs into the lungs 2 (two) times daily. 10/23/14  Yes Gouru, Illene Silver, MD  gabapentin (NEURONTIN) 800 MG tablet Take 1 tablet (800 mg total) by mouth 3 (three) times daily. 04/01/17  Yes Edrick Kins, DPM  ibuprofen (ADVIL) 800 MG tablet Take 800 mg by mouth every 6 (six) hours as needed for fever or mild pain.    Yes [provider]  insulin glargine (LANTUS) 100 UNIT/ML injection Inject 0.25 mLs (25 Units total) into the skin 2 (two) times daily. Patient taking differently: Inject 18 Units into the skin at bedtime.  10/23/14  Yes Gouru, Illene Silver, MD  insulin lispro (HUMALOG) 100 UNIT/ML injection Inject 1-18 Units into the skin 4 (four) times daily -  before meals and at bedtime. Sliding scale  07/11/16 07/28/18 Yes [provider]  morphine (MSIR) 15 MG tablet Take 1 tablet by mouth 3 (three) times daily. 05/24/18  Yes [provider]  nicotine (NICODERM CQ - DOSED IN MG/24 HOURS) 21 mg/24hr patch Place 1 patch (21 mg total) onto  the skin daily. Patient taking differently: Place 21 mg onto the skin daily as needed (smoking cessation).  10/23/14  Yes Gouru, Illene Silver, MD  oxyCODONE (OXY IR/ROXICODONE) 5 MG immediate release tablet Take 2 tablets (10 mg total) by mouth 3 (three) times daily. Patient taking differently: Take 15 mg by mouth 3 (three) times daily.  01/12/18  Yes Schuyler Amor, MD  tiotropium (SPIRIVA) 18 MCG inhalation capsule Place 1 capsule (18 mcg total) into inhaler and inhale daily. 10/23/14  Yes Gouru, Illene Silver, MD  elvitegravir-cobicistat-emtricitabine-tenofovir (GENVOYA) 150-150-200-10 MG TABS tablet Take 1 tablet by mouth daily with breakfast. Patient not taking: Reported on 01/12/2018 07/31/17   Lavonia Drafts, MD  elvitegravir-cobicistat-emtricitabine-tenofovir (GENVOYA) 150-150-200-10 MG TABS tablet Take 1 tablet by mouth daily with breakfast. Patient not taking: Reported on 01/12/2018 07/31/17   Lavonia Drafts, MD  lidocaine (LIDODERM) 5 % Place 1 patch onto the skin daily as needed. For pain. Remove & Discard patch within 12 hours or as  directed by MD    [provider]  ondansetron (ZOFRAN ODT) 4 MG disintegrating tablet Take 1 tablet (4 mg total) by mouth every 8 (eight) hours as needed for nausea or vomiting. Patient not taking: Reported on 07/28/2018 12/08/15   Carrie Mew, MD      VITAL SIGNS:  Blood pressure (!) 152/78, pulse 89, temperature 98.7 F (37.1 C), temperature source Oral, resp. rate 16, height 5' (1.524 m), weight 52.2 kg, SpO2 96 %.  PHYSICAL EXAMINATION:  Physical Exam Vitals signs and nursing note reviewed.  Constitutional:      General: She is not in acute distress.    Appearance: Normal appearance. She is normal weight.  HENT:     Head: Normocephalic.     Right Ear: External ear normal.     Left Ear: External ear normal.     Nose: Nose normal.     Mouth/Throat:     Mouth: Mucous membranes are moist.     Pharynx: Oropharynx is clear.  Eyes:     General: No  scleral icterus.    Extraocular Movements: Extraocular movements intact.     Conjunctiva/sclera: Conjunctivae normal.     Pupils: Pupils are equal, round, and reactive to light.  Neck:     Musculoskeletal: Normal range of motion and neck supple. No muscular tenderness.  Cardiovascular:     Rate and Rhythm: Normal rate and regular rhythm.     Pulses: Normal pulses.     Heart sounds: Normal heart sounds. No murmur. No friction rub. No gallop.   Pulmonary:     Effort: Pulmonary effort is normal. No respiratory distress.     Breath sounds: Wheezing (expiratory) and rhonchi (course bilateral) present. No rales.  Abdominal:     General: Bowel sounds are normal. There is no distension.     Palpations: Abdomen is soft. There is no mass.     Tenderness: There is no abdominal tenderness. There is no right CVA tenderness, left CVA tenderness, guarding or rebound.  Musculoskeletal: Normal range of motion.        General: No swelling or tenderness.     Right lower leg: No edema.     Left lower leg: No edema.  Lymphadenopathy:     Cervical: No cervical adenopathy.  Skin:    General: Skin is warm.     Capillary Refill: Capillary refill takes less than 2 seconds.     Findings: Erythema present. No rash.     Comments: Right plantar surface wound with surrounding erythema and edema - open puncture size wound draining purulent material  Neurological:     Mental Status: She is alert and oriented to person, place, and time.     Motor: No weakness.  Psychiatric:        Mood and Affect: Mood normal.        Behavior: Behavior normal.    LABORATORY PANEL:   CBC Recent Labs  Lab 07/28/18 1704  WBC 12.2*  HGB 13.9  HCT 43.1  PLT 431*   ------------------------------------------------------------------------------------------------------------------  Chemistries  Recent Labs  Lab 07/28/18 1704  NA 136  K 4.0  CL 96*  CO2 29  GLUCOSE 218*  BUN 22*  CREATININE 0.88  CALCIUM 9.5  AST  11*  ALT 7  ALKPHOS 85  BILITOT 0.4   ------------------------------------------------------------------------------------------------------------------  Cardiac Enzymes No results for input(s): TROPONINI in the last 168 hours. ------------------------------------------------------------------------------------------------------------------  RADIOLOGY:  Dg Chest 2 View  Result Date: 07/28/2018 CLINICAL DATA:  Right  foot redness EXAM: CHEST - 2 VIEW COMPARISON:  11/18/2014 FINDINGS: The heart size and mediastinal contours are within normal limits. Both lungs are clear. The visualized skeletal structures are unremarkable. IMPRESSION: No active cardiopulmonary disease. Electronically Signed   By: Donavan Foil M.D.   On: 07/28/2018 18:10   US Venous Img Lower Unilateral Right  Result Date: 07/28/2018 CLINICAL DATA:  Right lower extremity pain and swelling. EXAM: Right LOWER EXTREMITY VENOUS DOPPLER ULTRASOUND TECHNIQUE: Gray-scale sonography with graded compression, as well as color Doppler and duplex ultrasound were performed to evaluate the lower extremity deep venous systems from the level of the common femoral vein and including the common femoral, femoral, profunda femoral, popliteal and calf veins including the posterior tibial, peroneal and gastrocnemius veins when visible. The superficial great saphenous vein was also interrogated. Spectral Doppler was utilized to evaluate flow at rest and with distal augmentation maneuvers in the common femoral, femoral and popliteal veins. COMPARISON:  None. FINDINGS: Contralateral Common Femoral Vein: Respiratory phasicity is normal and symmetric with the symptomatic side. No evidence of thrombus. Normal compressibility. Common Femoral Vein: No evidence of thrombus. Normal compressibility, respiratory phasicity and response to augmentation. Saphenofemoral Junction: No evidence of thrombus. Normal compressibility and flow on color Doppler imaging. Profunda  Femoral Vein: No evidence of thrombus. Normal compressibility and flow on color Doppler imaging. Femoral Vein: No evidence of thrombus. Normal compressibility, respiratory phasicity and response to augmentation. Popliteal Vein: No evidence of thrombus. Normal compressibility, respiratory phasicity and response to augmentation. Calf Veins: No evidence of thrombus. Normal compressibility and flow on color Doppler imaging. Superficial Great Saphenous Vein: No evidence of thrombus. Normal compressibility. Venous Reflux:  None. Other Findings: There are prominent right inguinal lymph nodes that maintaining normal appearing morphology. IMPRESSION: 1. No DVT identified. 2. Prominent right inguinal lymph nodes that appear morphologically normal and are likely reactive to a right lower extremity process. Electronically Signed   By: Constance Holster M.D.   On: 07/28/2018 21:15   Dg Foot Complete Right  Result Date: 07/28/2018 CLINICAL DATA:  Right foot redness EXAM: RIGHT FOOT COMPLETE - 3+ VIEW COMPARISON:  07/29/2015 FINDINGS: No acute displaced fracture or malalignment. Small plantar calcaneal spur. Old fracture deformity of the fifth metatarsal with cystic lucency in the distal shaft. Plantar soft tissue swelling with ulceration at the level of the MTP joints. IMPRESSION: 1. No acute osseous abnormality 2. Plantar soft tissue swelling with ulceration 3. Chronic deformity fifth metatarsal Electronically Signed   By: Donavan Foil M.D.   On: 07/28/2018 18:12      IMPRESSION AND PLAN:   1.  Right diabetic foot ulcer with cellulitis - Patient started on IV antibiotics with Unasyn and vancomycin - Wound culture and blood cultures pending. -Will adjust treatment based on culture results. - Wound care nurse consulted for right plantar surface wound  2.  Diabetes mellitus - Hemoglobin A1c pending - Lantus insulin continue - Moderate sliding scale insulin  3.  COPD - Advair and Spiriva continued -DuoNebs  every 6 hours as needed for shortness of breath or cough  4.  Tobacco abuse - The importance of tobacco cessation has been discussed with the patient with literature offered  DVT and PPI prophylaxis initiated    All the records are reviewed and case discussed with ED provider. The plan of care was discussed in details with the patient (and family). I answered all questions. The patient agreed to proceed with the above mentioned plan. Further management will depend upon  hospital course.   CODE STATUS: Full code  TOTAL TIME TAKING CARE OF THIS PATIENT: 45 minutes.    Williams on 07/29/2018 at 2:05 AM  Pager - [phone]  After 6pm go to [email or web address] - Proofreader  [facility]  Office  [phone]  CC: Primary care physician; System, Pcp Not In   Note: This dictation was prepared with Dragon dictation along with smaller phrase technology. Any transcriptional errors that result from this process are unintentional.

## 2018-07-29 NOTE — Plan of Care (Signed)

## 2018-07-29 NOTE — Progress Notes (Signed)
ORTHOPAEDIC CONSULTATION  REQUESTING PHYSICIAN: Saundra Shelling, MD  Chief Complaint: Right foot infection  HPI: Katie Guzman is a 48 y.o. female who complains of pain and swelling with redness to her right foot.  Admitted with cellulitis.  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  Past Medical History:  Diagnosis Date  . Asthma   . Cancer ([place])    lung  . Cellulitis 04/15/2015  . COPD (chronic obstructive pulmonary disease) ([place])   . Diabetes mellitus    Poorly controlled with complications  . Emphysema   . Fibromyalgia   . Neuropathy   . Tenosynovitis of foot 04/16/2015   Past Surgical History:  Procedure Laterality Date  . BACK SURGERY    . CESAREAN SECTION    . TONSILLECTOMY    . TUBAL LIGATION     Social History   Socioeconomic History  . Marital status: Widowed    Spouse name: Not on file  . Number of children: Not on file  . Years of education: Not on file  . Highest education level: Not on file  Occupational History  . Not on file  Social Needs  . Financial resource strain: Not on file  . Food insecurity    Worry: Not on file    Inability: Not on file  . Transportation needs    Medical: Not on file    Non-medical: Not on file  Tobacco Use  . Smoking status: Current Some Day Smoker    Packs/day: 0.50    Years: 30.00    Pack years: 15.00    Types: Cigarettes    Last attempt to quit: 10/05/2014    Years since quitting: 3.8  . Smokeless tobacco: Never Used  Substance and Sexual Activity  . Alcohol use: No  . Drug use: No  . Sexual activity: Not on file  Lifestyle  . Physical activity    Days per week: Not on file    Minutes per session: Not on file  . Stress: Not on file  Relationships  . Social Herbalist on phone: Not on file    Gets together: Not on file    Attends religious service: Not on file    Active member of club or organization: Not on file    Attends meetings of  clubs or organizations: Not on file    Relationship status: Not on file  Other Topics Concern  . Not on file  Social History Narrative  . Not on file   Family History  Problem Relation Age of Onset  . Lung cancer Mother    Allergies  Allergen Reactions  . Flexeril [Cyclobenzaprine] Hives and Nausea And Vomiting  . Hydrocodone Hives  . Other Hives  . Darvocet [Propoxyphene N-Acetaminophen] Hives and Nausea And Vomiting  . Fish-Derived Products Other (See Comments)    Patient does not like to eat fish. Not allergic but prefers not to.  . Propofol Nausea And Vomiting  . Toradol [Ketorolac Tromethamine] Nausea And Vomiting  . Vicodin [Hydrocodone-Acetaminophen] Hives and Nausea And Vomiting  . Ketorolac Nausea And Vomiting  . Tramadol Nausea And Vomiting    Vomiting only   Prior to Admission medications   Medication Sig Start Date End Date Taking? Authorizing Provider  acetaminophen (TYLENOL) 325 MG tablet Take 650 mg by mouth every 6 (six) hours as needed for mild pain.   Yes [provider]  albuterol (PROVENTIL HFA;VENTOLIN HFA) 108 (90 BASE) MCG/ACT  inhaler Inhale 2 puffs into the lungs every 4 (four) hours as needed for wheezing or shortness of breath. For wheeze or shortness of breath Patient taking differently: Inhale 2 puffs into the lungs 2 (two) times a day. For wheeze or shortness of breath 10/23/14  Yes Gouru, Aruna, MD  albuterol (PROVENTIL) (2.5 MG/3ML) 0.083% nebulizer solution Take 2.5 mg by nebulization every 4 (four) hours as needed for wheezing or shortness of breath.    Yes [provider]  fluticasone-salmeterol (ADVAIR HFA) 115-21 MCG/ACT inhaler Inhale 2 puffs into the lungs 2 (two) times daily. 10/23/14  Yes Gouru, Illene Silver, MD  gabapentin (NEURONTIN) 800 MG tablet Take 1 tablet (800 mg total) by mouth 3 (three) times daily. 04/01/17  Yes Edrick Kins, DPM  ibuprofen (ADVIL) 800 MG tablet Take 800 mg by mouth every 6 (six) hours as needed for fever  or mild pain.    Yes [provider]  insulin glargine (LANTUS) 100 UNIT/ML injection Inject 0.25 mLs (25 Units total) into the skin 2 (two) times daily. Patient taking differently: Inject 18 Units into the skin at bedtime.  10/23/14  Yes Gouru, Illene Silver, MD  insulin lispro (HUMALOG) 100 UNIT/ML injection Inject 1-18 Units into the skin 4 (four) times daily -  before meals and at bedtime. Sliding scale  07/11/16 07/28/18 Yes [provider]  morphine (MSIR) 15 MG tablet Take 1 tablet by mouth 3 (three) times daily. 05/24/18  Yes [provider]  nicotine (NICODERM CQ - DOSED IN MG/24 HOURS) 21 mg/24hr patch Place 1 patch (21 mg total) onto the skin daily. Patient taking differently: Place 21 mg onto the skin daily as needed (smoking cessation).  10/23/14  Yes Gouru, Illene Silver, MD  oxyCODONE (OXY IR/ROXICODONE) 5 MG immediate release tablet Take 2 tablets (10 mg total) by mouth 3 (three) times daily. Patient taking differently: Take 15 mg by mouth 3 (three) times daily.  01/12/18  Yes Schuyler Amor, MD  tiotropium (SPIRIVA) 18 MCG inhalation capsule Place 1 capsule (18 mcg total) into inhaler and inhale daily. 10/23/14  Yes Gouru, Illene Silver, MD  elvitegravir-cobicistat-emtricitabine-tenofovir (GENVOYA) 150-150-200-10 MG TABS tablet Take 1 tablet by mouth daily with breakfast. Patient not taking: Reported on 01/12/2018 07/31/17   Lavonia Drafts, MD  elvitegravir-cobicistat-emtricitabine-tenofovir (GENVOYA) 150-150-200-10 MG TABS tablet Take 1 tablet by mouth daily with breakfast. Patient not taking: Reported on 01/12/2018 07/31/17   Lavonia Drafts, MD  lidocaine (LIDODERM) 5 % Place 1 patch onto the skin daily as needed. For pain. Remove & Discard patch within 12 hours or as directed by MD    [provider]  ondansetron (ZOFRAN ODT) 4 MG disintegrating tablet Take 1 tablet (4 mg total) by mouth every 8 (eight) hours as needed for nausea or vomiting. Patient not taking: Reported on  07/28/2018 12/08/15   Carrie Mew, MD   Dg Chest 2 View  Result Date: 07/28/2018 CLINICAL DATA:  Right foot redness EXAM: CHEST - 2 VIEW COMPARISON:  11/18/2014 FINDINGS: The heart size and mediastinal contours are within normal limits. Both lungs are clear. The visualized skeletal structures are unremarkable. IMPRESSION: No active cardiopulmonary disease. Electronically Signed   By: Donavan Foil M.D.   On: 07/28/2018 18:10   US Venous Img Lower Unilateral Right  Result Date: 07/28/2018 CLINICAL DATA:  Right lower extremity pain and swelling. EXAM: Right LOWER EXTREMITY VENOUS DOPPLER ULTRASOUND TECHNIQUE: Gray-scale sonography with graded compression, as well as color Doppler and duplex ultrasound were performed to evaluate the lower  extremity deep venous systems from the level of the common femoral vein and including the common femoral, femoral, profunda femoral, popliteal and calf veins including the posterior tibial, peroneal and gastrocnemius veins when visible. The superficial great saphenous vein was also interrogated. Spectral Doppler was utilized to evaluate flow at rest and with distal augmentation maneuvers in the common femoral, femoral and popliteal veins. COMPARISON:  None. FINDINGS: Contralateral Common Femoral Vein: Respiratory phasicity is normal and symmetric with the symptomatic side. No evidence of thrombus. Normal compressibility. Common Femoral Vein: No evidence of thrombus. Normal compressibility, respiratory phasicity and response to augmentation. Saphenofemoral Junction: No evidence of thrombus. Normal compressibility and flow on color Doppler imaging. Profunda Femoral Vein: No evidence of thrombus. Normal compressibility and flow on color Doppler imaging. Femoral Vein: No evidence of thrombus. Normal compressibility, respiratory phasicity and response to augmentation. Popliteal Vein: No evidence of thrombus. Normal compressibility, respiratory phasicity and response to  augmentation. Calf Veins: No evidence of thrombus. Normal compressibility and flow on color Doppler imaging. Superficial Great Saphenous Vein: No evidence of thrombus. Normal compressibility. Venous Reflux:  None. Other Findings: There are prominent right inguinal lymph nodes that maintaining normal appearing morphology. IMPRESSION: 1. No DVT identified. 2. Prominent right inguinal lymph nodes that appear morphologically normal and are likely reactive to a right lower extremity process. Electronically Signed   By: Constance Holster M.D.   On: 07/28/2018 21:15   Dg Foot Complete Right  Result Date: 07/28/2018 CLINICAL DATA:  Right foot redness EXAM: RIGHT FOOT COMPLETE - 3+ VIEW COMPARISON:  07/29/2015 FINDINGS: No acute displaced fracture or malalignment. Small plantar calcaneal spur. Old fracture deformity of the fifth metatarsal with cystic lucency in the distal shaft. Plantar soft tissue swelling with ulceration at the level of the MTP joints. IMPRESSION: 1. No acute osseous abnormality 2. Plantar soft tissue swelling with ulceration 3. Chronic deformity fifth metatarsal Electronically Signed   By: Donavan Foil M.D.   On: 07/28/2018 18:12    Positive ROS: All other systems have been reviewed and were otherwise negative with the exception of those mentioned in the HPI and as above.  12 point ROS was performed.  Physical Exam: General: Alert and oriented.  No apparent distress.  Vascular:  Left foot:Dorsalis Pedis:  present Posterior Tibial:  present  Right foot: Dorsalis Pedis:  present Posterior Tibial:  present  Neuro:intact sensation.  Derm: Of the plantar aspect of the right first MTPJ is hyperkeratotic tissue.  I was able to debride this away.  There is a small pinpoint ulceration to this area.  Also noted a small 2 mm superficial opening of the skin medially.  There is mild cellulitis dorsally around the first MTPJ.  No lymphangitic streaking.  Ortho/MS: Very uncomfortable with any  attempted palpation to the right first MTPJ.  Pain seemed to be somewhat out of proportion to inflammation and erythema.  No pain at the ankle.  Mild edema to the right foot.  Assessment: Cellulitis with superficial ulceration plantar right first MTPJ  Plan: There is no deep abscess at this time.  I was able to superficially debride the hyperkeratosis.  No areas of purulent drainage.  A small very superficial irritation of skin with a scant opening I was able to obtain a superficial culture.  She is currently on antibiotics.  I recommend continue with oral antibiotics.  At this point no concern for deep infection but if wound continues to progress please reconsult and I can reevaluate.  Can always consider  MRI to further evaluate.  This time no further debridement needed.  Defer wound care toWound care nurses orders.  Patient can be seen outpatient.  Is a previous patient of [facility] and recommend follow-up with them.    Elesa Hacker, DPM Cell [phone]   07/29/2018 12:42 PM ] \

## 2018-07-29 NOTE — ED Notes (Signed)
Report given to April RN

## 2018-07-29 NOTE — ED Notes (Signed)
ED TO INPATIENT HANDOFF REPORT  ED Nurse Name and Phone #:  Carles Collet Name/Age/Gender Katie Guzman 48 y.o. female Room/Bed: ED18A/ED18A  Code Status   Code Status: Full Code  Home/SNF/Other Home Patient oriented to: self, place, time and situation Is this baseline? Yes   Triage Complete: Triage complete  Chief Complaint Cancer pt/Rt foot pain  Triage Note C/O right foot swelling, redness and pain to ball of foot x 3 days.  Patient states she thinks she has osteomyelitis.  Patient has throat Ca, and last radiation one month abo.   Allergies Allergies  Allergen Reactions  . Flexeril [Cyclobenzaprine] Hives and Nausea And Vomiting  . Hydrocodone Hives  . Other Hives  . Darvocet [Propoxyphene N-Acetaminophen] Hives and Nausea And Vomiting  . Fish-Derived Products Other (See Comments)    Patient does not like to eat fish. Not allergic but prefers not to.  . Propofol Nausea And Vomiting  . Toradol [Ketorolac Tromethamine] Nausea And Vomiting  . Vicodin [Hydrocodone-Acetaminophen] Hives and Nausea And Vomiting  . Ketorolac Nausea And Vomiting  . Tramadol Nausea And Vomiting    Vomiting only    Level of Care/Admitting Diagnosis ED Disposition    ED Disposition Condition [facility] Area: [place] [[id number]]  Level of Care: Med-Surg [16]  Covid Evaluation: Screening Protocol (No Symptoms)  Diagnosis: Diabetic ulcer of right foot associated with diabetes mellitus due to underlying condition, unspecified part of foot, unspecified ulcer stage [facility]) [[id number]]  Admitting Physician: Mayer Camel [[id number]]  Attending Physician: Mayer Camel [[id number]]  Estimated length of stay: 3 - 4 days  Certification:: I certify this patient will need inpatient services for at least 2 midnights  PT Class (Do Not Modify): Inpatient [101]  PT Acc Code (Do Not Modify): Private [1]       B Medical/Surgery History Past Medical History:   Diagnosis Date  . Asthma   . Cancer ([place])    lung  . Cellulitis 04/15/2015  . COPD (chronic obstructive pulmonary disease) ([place])   . Diabetes mellitus    Poorly controlled with complications  . Emphysema   . Fibromyalgia   . Neuropathy   . Tenosynovitis of foot 04/16/2015   Past Surgical History:  Procedure Laterality Date  . BACK SURGERY    . CESAREAN SECTION    . TONSILLECTOMY    . TUBAL LIGATION       A IV Location/Drains/Wounds Patient Lines/Drains/Airways Status   Active Line/Drains/Airways    Name:   Placement date:   Placement time:   Site:   Days:   Peripheral IV 07/31/17 Left;Posterior Forearm   07/31/17    0820    Forearm   363   Peripheral IV 01/12/18 Right Antecubital   01/12/18    1807    Antecubital   198   Peripheral IV 07/28/18 Left Forearm   07/28/18    2128    Forearm   1   Wound / Incision (Open or Dehisced) 04/15/15 Diabetic ulcer Toe (Comment  which one) Right   04/15/15    1915    Toe (Comment  which one)   1201   Wound / Incision (Open or Dehisced) 08/23/16 Diabetic ulcer Toe (Comment  which one) Left   08/23/16    0530    Toe (Comment  which one)   705          Intake/Output Last 24 hours  Intake/Output Summary (Last 24 hours)  at 07/29/2018 0242 Last data filed at 07/28/2018 2333 Gross per 24 hour  Intake 250 ml  Output -  Net 250 ml    Labs/Imaging Results for orders placed or performed during the hospital encounter of 07/28/18 (from the past 48 hour(s))  Lactic acid, plasma     Status: None   Collection Time: 07/28/18  5:04 PM  Result Value Ref Range   Lactic Acid, Venous 0.8 0.5 - 1.9 mmol/L    Comment: Performed at [facility], [address] [place], [place] [address]  Comprehensive metabolic panel     Status: Abnormal   Collection Time: 07/28/18  5:04 PM  Result Value Ref Range   Sodium 136 135 - 145 mmol/L   Potassium 4.0 3.5 - 5.1 mmol/L   Chloride 96 (L) 98 - 111 mmol/L   CO2 29 22 - 32 mmol/L   Glucose, Bld 218 (H)  70 - 99 mg/dL   BUN 22 (H) 6 - 20 mg/dL   Creatinine, Ser 0.88 0.44 - 1.00 mg/dL   Calcium 9.5 8.9 - 10.3 mg/dL   Total Protein 7.6 6.5 - 8.1 g/dL   Albumin 3.6 3.5 - 5.0 g/dL   AST 11 (L) 15 - 41 U/L   ALT 7 0 - 44 U/L   Alkaline Phosphatase 85 38 - 126 U/L   Total Bilirubin 0.4 0.3 - 1.2 mg/dL   GFR calc non Af Amer >60 >60 mL/min   GFR calc Af Amer >60 >60 mL/min   Anion gap 11 5 - 15    Comment: Performed at [facility] [address] [place], [place] [address]  CBC with Differential     Status: Abnormal   Collection Time: 07/28/18  5:04 PM  Result Value Ref Range   WBC 12.2 (H) 4.0 - 10.5 K/uL   RBC 5.00 3.87 - 5.11 MIL/uL   Hemoglobin 13.9 12.0 - 15.0 g/dL   HCT 43.1 36.0 - 46.0 %   MCV 86.2 80.0 - 100.0 fL   MCH 27.8 26.0 - 34.0 pg   MCHC 32.3 30.0 - 36.0 g/dL   RDW 14.6 11.5 - 15.5 %   Platelets 431 (H) 150 - 400 K/uL   nRBC 0.0 0.0 - 0.2 %   Neutrophils Relative % 87 %   Neutro Abs 10.6 (H) 1.7 - 7.7 K/uL   Lymphocytes Relative 6 %   Lymphs Abs 0.7 0.7 - 4.0 K/uL   Monocytes Relative 6 %   Monocytes Absolute 0.7 0.1 - 1.0 K/uL   Eosinophils Relative 1 %   Eosinophils Absolute 0.2 0.0 - 0.5 K/uL   Basophils Relative 0 %   Basophils Absolute 0.1 0.0 - 0.1 K/uL   Immature Granulocytes 0 %   Abs Immature Granulocytes 0.04 0.00 - 0.07 K/uL    Comment: Performed at [facility], [address], [place],  [address]  SARS Coronavirus 2 (CEPHEID - Performed in [place] hospital lab), Hosp Order     Status: None   Collection Time: 07/28/18 10:12 PM   Specimen: Nasopharyngeal Swab  Result Value Ref Range   SARS Coronavirus 2 NEGATIVE NEGATIVE    Comment: (NOTE) If result is NEGATIVE SARS-CoV-2 target nucleic acids are NOT DETECTED. The SARS-CoV-2 RNA is generally detectable in upper and lower  respiratory specimens during the acute phase of infection. The lowest  concentration of SARS-CoV-2 viral copies this assay can detect is 250   copies / mL. A negative result does not preclude  SARS-CoV-2 infection  and should not be used as the sole basis for treatment or other  patient management decisions.  A negative result may occur with  improper specimen collection / handling, submission of specimen other  than nasopharyngeal swab, presence of viral mutation(s) within the  areas targeted by this assay, and inadequate number of viral copies  (<250 copies / mL). A negative result must be combined with clinical  observations, patient history, and epidemiological information. If result is POSITIVE SARS-CoV-2 target nucleic acids are DETECTED. The SARS-CoV-2 RNA is generally detectable in upper and lower  respiratory specimens dur ing the acute phase of infection.  Positive  results are indicative of active infection with SARS-CoV-2.  Clinical  correlation with patient history and other diagnostic information is  necessary to determine patient infection status.  Positive results do  not rule out bacterial infection or co-infection with other viruses. If result is PRESUMPTIVE POSTIVE SARS-CoV-2 nucleic acids MAY BE PRESENT.   A presumptive positive result was obtained on the submitted specimen  and confirmed on repeat testing.  While 2019 novel coronavirus  (SARS-CoV-2) nucleic acids may be present in the submitted sample  additional confirmatory testing may be necessary for epidemiological  and / or clinical management purposes  to differentiate between  SARS-CoV-2 and other Sarbecovirus currently known to infect humans.  If clinically indicated additional testing with an alternate test  methodology [phone]) is advised. The SARS-CoV-2 RNA is generally  detectable in upper and lower respiratory sp ecimens during the acute  phase of infection. The expected result is Negative. Fact Sheet for Patients:  StrictlyIdeas.no Fact Sheet for Healthcare  Providers: BankingDealers.co.za This test is not yet approved or cleared by the [place] FDA and has been authorized for detection and/or diagnosis of SARS-CoV-2 by FDA under an Emergency Use Authorization (EUA).  This EUA will remain in effect (meaning this test can be used) for the duration of the COVID-19 declaration under Section 564(b)(1) of the Act, 21 U.S.C. section 360bbb-3(b)(1), unless the authorization is terminated or revoked sooner. Performed at [facility], [address] [place], [place] [address]    Dg Chest 2 View  Result Date: 07/28/2018 CLINICAL DATA:  Right foot redness EXAM: CHEST - 2 VIEW COMPARISON:  11/18/2014 FINDINGS: The heart size and mediastinal contours are within normal limits. Both lungs are clear. The visualized skeletal structures are unremarkable. IMPRESSION: No active cardiopulmonary disease. Electronically Signed   By: Donavan Foil M.D.   On: 07/28/2018 18:10   US Venous Img Lower Unilateral Right  Result Date: 07/28/2018 CLINICAL DATA:  Right lower extremity pain and swelling. EXAM: Right LOWER EXTREMITY VENOUS DOPPLER ULTRASOUND TECHNIQUE: Gray-scale sonography with graded compression, as well as color Doppler and duplex ultrasound were performed to evaluate the lower extremity deep venous systems from the level of the common femoral vein and including the common femoral, femoral, profunda femoral, popliteal and calf veins including the posterior tibial, peroneal and gastrocnemius veins when visible. The superficial great saphenous vein was also interrogated. Spectral Doppler was utilized to evaluate flow at rest and with distal augmentation maneuvers in the common femoral, femoral and popliteal veins. COMPARISON:  None. FINDINGS: Contralateral Common Femoral Vein: Respiratory phasicity is normal and symmetric with the symptomatic side. No evidence of thrombus. Normal compressibility. Common Femoral Vein: No evidence of  thrombus. Normal compressibility, respiratory phasicity and response to augmentation. Saphenofemoral Junction: No evidence of thrombus. Normal compressibility and flow on color Doppler imaging. Profunda Femoral Vein: No evidence  of thrombus. Normal compressibility and flow on color Doppler imaging. Femoral Vein: No evidence of thrombus. Normal compressibility, respiratory phasicity and response to augmentation. Popliteal Vein: No evidence of thrombus. Normal compressibility, respiratory phasicity and response to augmentation. Calf Veins: No evidence of thrombus. Normal compressibility and flow on color Doppler imaging. Superficial Great Saphenous Vein: No evidence of thrombus. Normal compressibility. Venous Reflux:  None. Other Findings: There are prominent right inguinal lymph nodes that maintaining normal appearing morphology. IMPRESSION: 1. No DVT identified. 2. Prominent right inguinal lymph nodes that appear morphologically normal and are likely reactive to a right lower extremity process. Electronically Signed   By: Constance Holster M.D.   On: 07/28/2018 21:15   Dg Foot Complete Right  Result Date: 07/28/2018 CLINICAL DATA:  Right foot redness EXAM: RIGHT FOOT COMPLETE - 3+ VIEW COMPARISON:  07/29/2015 FINDINGS: No acute displaced fracture or malalignment. Small plantar calcaneal spur. Old fracture deformity of the fifth metatarsal with cystic lucency in the distal shaft. Plantar soft tissue swelling with ulceration at the level of the MTP joints. IMPRESSION: 1. No acute osseous abnormality 2. Plantar soft tissue swelling with ulceration 3. Chronic deformity fifth metatarsal Electronically Signed   By: Donavan Foil M.D.   On: 07/28/2018 18:12    Pending Labs Unresulted Labs (From admission, onward)    Start     Ordered   08/05/18 0500  Creatinine, serum  (enoxaparin (LOVENOX)    CrCl >/= 30 ml/min)  Weekly,   STAT    Comments: while on enoxaparin therapy    07/29/18 0157   07/29/18 7494  Basic  metabolic panel  Tomorrow morning,   STAT     07/29/18 0157   07/29/18 0500  CBC  Tomorrow morning,   STAT     07/29/18 0157   07/29/18 0157  Hemoglobin A1c  Once,   STAT     07/29/18 0157   07/29/18 0157  TSH  Once,   STAT     07/29/18 0157   07/29/18 0155  CBC  (enoxaparin (LOVENOX)    CrCl >/= 30 ml/min)  Once,   STAT    Comments: Baseline for enoxaparin therapy IF NOT ALREADY DRAWN.  Notify MD if PLT < 100 K.    07/29/18 0157   07/29/18 0155  Creatinine, serum  (enoxaparin (LOVENOX)    CrCl >/= 30 ml/min)  Once,   STAT    Comments: Baseline for enoxaparin therapy IF NOT ALREADY DRAWN.    07/29/18 0157   07/29/18 0153  HIV antibody (Routine Testing)  Once,   STAT     07/29/18 0157   07/29/18 0153  Culture, blood (routine x 2)  BLOOD CULTURE X 2,   STAT    Comments: for severe disease only    07/29/18 0157   07/29/18 0152  Aerobic Culture (superficial specimen)  Once,   STAT    Question:  Patient immune status  Answer:  Normal   07/29/18 0151   07/29/18 0151  Hemoglobin A1c  Once,   STAT    Comments: To assess prior glycemic control    07/29/18 0151   07/28/18 1657  Lactic acid, plasma  Now then every 2 hours,   STAT     07/28/18 1657   07/28/18 1657  Urinalysis, Complete w Microscopic  ONCE - STAT,   STAT     07/28/18 1657          Vitals/Pain Today's Vitals   07/28/18 2230 07/28/18 2300 07/28/18 2330  07/29/18 0100  BP: (!) 160/126 (!) 147/74 (!) 126/58 (!) 152/78  Pulse: 88 89    Resp:      Temp:      TempSrc:      SpO2: 96% 96%    Weight:      Height:      PainSc:        Isolation Precautions No active isolations  Medications Medications  mometasone-formoterol (DULERA) 200-5 MCG/ACT inhaler 2 puff (has no administration in time range)  gabapentin (NEURONTIN) tablet 800 mg (has no administration in time range)  insulin glargine (LANTUS) injection 18 Units (has no administration in time range)  tiotropium (SPIRIVA) inhalation capsule ([facility] use ONLY) 18  mcg (has no administration in time range)  insulin aspart (novoLOG) injection 0-15 Units (has no administration in time range)  insulin aspart (novoLOG) injection 0-5 Units (has no administration in time range)  vancomycin (VANCOCIN) IVPB 1000 mg/200 mL premix (has no administration in time range)  enoxaparin (LOVENOX) injection 40 mg (has no administration in time range)  sodium chloride flush (NS) 0.9 % injection 3 mL (has no administration in time range)  sodium chloride flush (NS) 0.9 % injection 3 mL (has no administration in time range)  0.9 %  sodium chloride infusion (has no administration in time range)  acetaminophen (TYLENOL) tablet 650 mg (has no administration in time range)    Or  acetaminophen (TYLENOL) suppository 650 mg (has no administration in time range)  polyethylene glycol (MIRALAX / GLYCOLAX) packet 17 g (has no administration in time range)  ondansetron (ZOFRAN) tablet 4 mg (has no administration in time range)    Or  ondansetron (ZOFRAN) injection 4 mg (has no administration in time range)  sodium chloride flush (NS) 0.9 % injection 3 mL (3 mLs Intravenous Given 07/28/18 2139)  vancomycin (VANCOCIN) IVPB 1000 mg/200 mL premix (0 mg Intravenous Stopped 07/28/18 2333)  piperacillin-tazobactam (ZOSYN) IVPB 3.375 g (0 g Intravenous Stopped 07/28/18 2202)  HYDROmorphone (DILAUDID) injection 0.5 mg (0.5 mg Intravenous Given 07/28/18 2129)  ondansetron (ZOFRAN) injection 4 mg (4 mg Intravenous Given 07/28/18 2129)  morphine 2 MG/ML injection 2 mg (2 mg Intravenous Given 07/29/18 0041)    Mobility walks with device Low fall risk   Focused Assessments n/a   R Recommendations: See Admitting Provider Note  Report given to:   Additional Notes: n/a

## 2018-07-29 NOTE — Progress Notes (Addendum)
Initial Nutrition Assessment  DOCUMENTATION CODES:   Not applicable  INTERVENTION:   Prostat liquid protein PO 30 ml BID with meals, each supplement provides 100 kcal, 15 grams protein.  Ocuvite daily for wound healing (provides zinc, vitamin A, vitamin C, Vitamin E, copper, and selenium)  Vitamin C 250mg  po BID  Diabetes diet education   Liberalize diet   Pt likely at high refeed risk; recommend monitor K, Mg and P labs daily once oral intake improves.   NUTRITION DIAGNOSIS:   Increased nutrient needs related to wound healing as evidenced by increased estimated needs.  GOAL:   Patient will meet greater than or equal to 90% of their needs  MONITOR:   PO intake, Supplement acceptance, Labs, I & O's, Skin, Weight trends  REASON FOR ASSESSMENT:   Malnutrition Screening Tool    ASSESSMENT:   48 y.o. female with a known history of poorly controlled diabetes mellitus, osteomyelitis with left great toe amputation, COPD, tobacco abuse and history of throat cancer s/p chemo/XRT admitted with multiple diabetic foot wounds with cellulitis  RD working remotely.  Spoke with pt via phone. Pt very upset today, reports she feels terrible, nobody can understand her and she is unable to order the foods she enjoys. Pt reports poor appetite and oral intake pta. Pt reports that for the past year, she has been undergoing chemo and radiation and she has been unable to eat much. Pt reports that she hasn't eaten anything for the past 3 days. Pt reports eating a few bites of grits for breakfast this morning. Per chart, pt has lost 42lbs(27%) over the past year; this is significant weight loss. Pt does not like supplements, she reports they make her sick. RD discussed the importance of adequate nutrition needed for wound healing as well as to preserve lean muscle mass. Pt is willing to try Prostat supplement. RD will also liberalize diet and add vitamins to support wound healing. RD discussed with  patient how elevated blood sugars can affect healing. Briefly discussed diabetic diet as pt not in a good mood for learning today; will provided handouts with information to pt via mail.   Pt at high risk for malnutrition but unable to diagnose at this time as NFPE cannot be performed.   Medications reviewed and include: lovenox, insulin, nicotine, vancomycin    Labs reviewed: wbc- 10.8(H) cbgs- 347, 82 x 24 hrs AIC 9.5(H)- 6/19  Unable to complete Nutrition-Focused physical exam at this time.   Diet Order:   Diet Order            Diet Carb Modified Fluid consistency: Thin; Room service appropriate? Yes  Diet effective now             EDUCATION NEEDS:   Education needs have been addressed  Skin:  Skin Assessment: Reviewed RN Assessment(Right great toe:  1 cm x 1.3 cm x 0.2 cm with callous to periwound, Left fourth toe:  0.3 cm scab to tip of toe)  Last BM:  6/17  Height:   Ht Readings from Last 1 Encounters:  07/28/18 5' (1.524 m)    Weight:   Wt Readings from Last 1 Encounters:  07/28/18 52.2 kg    Ideal Body Weight:  45.4 kg  BMI:  Body mass index is 22.46 kg/m.  Estimated Nutritional Needs:   Kcal:  1400-1600kcal/day  Protein:  70-80g/day  Fluid:  >1.5L/day  Koleen Distance MS, RD, LDN Pager #- [phone] Office#- [phone] After Hours Pager: [phone]

## 2018-07-29 NOTE — Progress Notes (Signed)
Pharmacy Antibiotic Note  Katie Guzman is a 48 y.o. female admitted on 07/28/2018 with cellulitis.  Pharmacy has been consulted for Vancomycin dosing.  Plan: Vancomycin 1000 mg IV Q 24 hrs. Goal AUC 400-550. Expected AUC: 495.5 SCr used: 0.88 Expected Cmin: 11.2   Height: 5' (152.4 cm) Weight: 115 lb (52.2 kg) IBW/kg (Calculated) : 45.5  Temp (24hrs), Avg:98.6 F (37 C), Min:98.5 F (36.9 C), Max:98.7 F (37.1 C)  Recent Labs  Lab 07/28/18 1704 07/29/18 0404  WBC 12.2* 10.8*  CREATININE 0.88  --   LATICACIDVEN 0.8  --     Estimated Creatinine Clearance: 55.5 mL/min (by C-G formula based on SCr of 0.88 mg/dL).    Allergies  Allergen Reactions  . Flexeril [Cyclobenzaprine] Hives and Nausea And Vomiting  . Hydrocodone Hives  . Other Hives  . Darvocet [Propoxyphene N-Acetaminophen] Hives and Nausea And Vomiting  . Fish-Derived Products Other (See Comments)    Patient does not like to eat fish. Not allergic but prefers not to.  . Propofol Nausea And Vomiting  . Toradol [Ketorolac Tromethamine] Nausea And Vomiting  . Vicodin [Hydrocodone-Acetaminophen] Hives and Nausea And Vomiting  . Ketorolac Nausea And Vomiting  . Tramadol Nausea And Vomiting    Vomiting only    Antimicrobials this admission: Vancomycin 6/19 >>  Zosyn 6/19 >>   Thank you for allowing pharmacy to be a part of this patient's care.  Paulina Fusi, PharmD, BCPS 07/29/2018 4:33 AM

## 2018-07-29 NOTE — Progress Notes (Signed)
[name] [place] NAME: Katie Guzman    MR#:  [id number]  DATE OF BIRTH:  November 20, 1970  SUBJECTIVE:  CHIEF COMPLAINT:   Chief Complaint  Patient presents with   Foot Swelling  Patient seen and evaluated today Has right foot pain and swelling Necrotic debris is noted No fever  REVIEW OF SYSTEMS:    ROS  CONSTITUTIONAL: No documented fever. No fatigue, weakness. No weight gain, no weight loss.  EYES: No blurry or double vision.  ENT: No tinnitus. No postnasal drip. No redness of the oropharynx.  RESPIRATORY: No cough, no wheeze, no hemoptysis. No dyspnea.  CARDIOVASCULAR: No chest pain. No orthopnea. No palpitations. No syncope.  GASTROINTESTINAL: No nausea, no vomiting or diarrhea. No abdominal pain. No melena or hematochezia.  GENITOURINARY: No dysuria or hematuria.  ENDOCRINE: No polyuria or nocturia. No heat or cold intolerance.  HEMATOLOGY: No anemia. No bruising. No bleeding.  INTEGUMENTARY: No rashes.  Right foot wound MUSCULOSKELETAL: No arthritis.  Right foot swelling. No gout.  NEUROLOGIC: No numbness, tingling, or ataxia. No seizure-type activity.  PSYCHIATRIC: No anxiety. No insomnia. No ADD.   DRUG ALLERGIES:   Allergies  Allergen Reactions   Flexeril [Cyclobenzaprine] Hives and Nausea And Vomiting   Hydrocodone Hives   Other Hives   Darvocet [Propoxyphene N-Acetaminophen] Hives and Nausea And Vomiting   Fish-Derived Products Other (See Comments)    Patient does not like to eat fish. Not allergic but prefers not to.   Propofol Nausea And Vomiting   Toradol [Ketorolac Tromethamine] Nausea And Vomiting   Vicodin [Hydrocodone-Acetaminophen] Hives and Nausea And Vomiting   Ketorolac Nausea And Vomiting   Tramadol Nausea And Vomiting    Vomiting only    VITALS:  Blood pressure (!) 144/99, pulse 84, temperature 98.5 F (36.9 C), temperature source Oral, resp. rate 20, height 5' (1.524 m), weight 52.2  kg, SpO2 96 %.  PHYSICAL EXAMINATION:   Physical Exam  GENERAL:  48 y.o.-year-old patient lying in the bed with no acute distress.  EYES: Pupils equal, round, reactive to light and accommodation. No scleral icterus. Extraocular muscles intact.  HEENT: Head atraumatic, normocephalic. Oropharynx and nasopharynx clear.  NECK:  Supple, no jugular venous distention. No thyroid enlargement, no tenderness.  LUNGS: Normal breath sounds bilaterally, no wheezing, rales, rhonchi. No use of accessory muscles of respiration.  CARDIOVASCULAR: S1, S2 normal. No murmurs, rubs, or gallops.  ABDOMEN: Soft, nontender, nondistended. Bowel sounds present. No organomegaly or mass.  EXTREMITIES: No cyanosis, clubbing or edema b/l.    Right foot ulcer NEUROLOGIC: Cranial nerves II through XII are intact. No focal Motor or sensory deficits b/l.   PSYCHIATRIC: The patient is alert and oriented x 3.  SKIN: Right foot ulcer     LABORATORY PANEL:   CBC Recent Labs  Lab 07/29/18 0404  WBC 10.8*  HGB 14.2  HCT 44.0  PLT 427*   ------------------------------------------------------------------------------------------------------------------ Chemistries  Recent Labs  Lab 07/28/18 1704 07/29/18 0404  NA 136 136  K 4.0 3.7  CL 96* 96*  CO2 29 30  GLUCOSE 218* 337*  BUN 22* 20  CREATININE 0.88 0.90  CALCIUM 9.5 9.1  AST 11*  --   ALT 7  --   ALKPHOS 85  --   BILITOT 0.4  --    ------------------------------------------------------------------------------------------------------------------  Cardiac Enzymes No results for input(s): TROPONINI in the last 168 hours. ------------------------------------------------------------------------------------------------------------------  RADIOLOGY:  Dg Chest 2 View  Result Date: 07/28/2018 CLINICAL DATA:  Right foot redness EXAM: CHEST - 2 VIEW COMPARISON:  11/18/2014 FINDINGS: The heart size and mediastinal contours are within normal limits. Both  lungs are clear. The visualized skeletal structures are unremarkable. IMPRESSION: No active cardiopulmonary disease. Electronically Signed   By: Donavan Foil M.D.   On: 07/28/2018 18:10   US Venous Img Lower Unilateral Right  Result Date: 07/28/2018 CLINICAL DATA:  Right lower extremity pain and swelling. EXAM: Right LOWER EXTREMITY VENOUS DOPPLER ULTRASOUND TECHNIQUE: Gray-scale sonography with graded compression, as well as color Doppler and duplex ultrasound were performed to evaluate the lower extremity deep venous systems from the level of the common femoral vein and including the common femoral, femoral, profunda femoral, popliteal and calf veins including the posterior tibial, peroneal and gastrocnemius veins when visible. The superficial great saphenous vein was also interrogated. Spectral Doppler was utilized to evaluate flow at rest and with distal augmentation maneuvers in the common femoral, femoral and popliteal veins. COMPARISON:  None. FINDINGS: Contralateral Common Femoral Vein: Respiratory phasicity is normal and symmetric with the symptomatic side. No evidence of thrombus. Normal compressibility. Common Femoral Vein: No evidence of thrombus. Normal compressibility, respiratory phasicity and response to augmentation. Saphenofemoral Junction: No evidence of thrombus. Normal compressibility and flow on color Doppler imaging. Profunda Femoral Vein: No evidence of thrombus. Normal compressibility and flow on color Doppler imaging. Femoral Vein: No evidence of thrombus. Normal compressibility, respiratory phasicity and response to augmentation. Popliteal Vein: No evidence of thrombus. Normal compressibility, respiratory phasicity and response to augmentation. Calf Veins: No evidence of thrombus. Normal compressibility and flow on color Doppler imaging. Superficial Great Saphenous Vein: No evidence of thrombus. Normal compressibility. Venous Reflux:  None. Other Findings: There are prominent right  inguinal lymph nodes that maintaining normal appearing morphology. IMPRESSION: 1. No DVT identified. 2. Prominent right inguinal lymph nodes that appear morphologically normal and are likely reactive to a right lower extremity process. Electronically Signed   By: Constance Holster M.D.   On: 07/28/2018 21:15   Dg Foot Complete Right  Result Date: 07/28/2018 CLINICAL DATA:  Right foot redness EXAM: RIGHT FOOT COMPLETE - 3+ VIEW COMPARISON:  07/29/2015 FINDINGS: No acute displaced fracture or malalignment. Small plantar calcaneal spur. Old fracture deformity of the fifth metatarsal with cystic lucency in the distal shaft. Plantar soft tissue swelling with ulceration at the level of the MTP joints. IMPRESSION: 1. No acute osseous abnormality 2. Plantar soft tissue swelling with ulceration 3. Chronic deformity fifth metatarsal Electronically Signed   By: Donavan Foil M.D.   On: 07/28/2018 18:12     ASSESSMENT AND PLAN:   48 year old female patient with history of type 2 diabetes mellitus, COPD, tobacco abuse currently under hospitalist service  -Right foot cellulitis Right foot diabetic foot ulcer Continue IV vancomycin and Unasyn antibiotics Wound care consult Podiatry consult Follow-up cultures  -Emphysema Stable Continue home dose inhalers and neb treatments  -Tobacco abuse Tobacco cessation counseled to the patient for 6 minutes Nicotine patch offered  -Type 2 diabetes mellitus Diabetic diet Lantus insulin with sliding scale coverage insulin  All the records are reviewed and case discussed with Care Management/Social Worker. Management plans discussed with the patient, family and they are in agreement.  CODE STATUS: Full code  DVT Prophylaxis: SCDs  TOTAL TIME TAKING CARE OF THIS PATIENT: 38 minutes.   POSSIBLE D/C IN 2 to 3 DAYS, DEPENDING ON CLINICAL CONDITION.  Saundra Shelling M.D on 07/29/2018 at 9:35 AM  Between 7am to 6pm - Pager - [phone]  After 6pm go to  [email or web address] - password [email or web address] [facility]  Office  [phone]  CC: Primary care physician; System, Pcp Not In  Note: This dictation was prepared with Dragon dictation along with smaller phrase technology. Any transcriptional errors that result from this process are unintentional.

## 2018-07-29 NOTE — Progress Notes (Addendum)
Inpatient Diabetes Program Recommendations  AACE/ADA: New Consensus Statement on Inpatient Glycemic Control (2015)  Target Ranges:  Prepandial:   less than 140 mg/dL      Peak postprandial:   less than 180 mg/dL (1-2 hours)      Critically ill patients:  140 - 180 mg/dL   Results for SENG, FOUTS (MRN [id number]) as of 07/29/2018 08:03  Ref. Range 07/29/2018 03:23  Glucose-Capillary Latest Ref Range: 70 - 99 mg/dL 347 (H)  11 units NOVOLOG    Results for MORGHAN, KESTER (MRN [id number]) as of 07/29/2018 14:59  Ref. Range 07/29/2018 04:04  Hemoglobin A1C Latest Ref Range: 4.8 - 5.6 % 9.5 (H)    Admit with: Right diabetic foot ulcer with cellulitis  History: DM  Home DM Meds: Lantus 18 units QHS       Humalog 1-18 units QID per SSI  Current Orders: Lantus 18 units QHS      Novolog Moderate Correction Scale/ SSI (0-15 units) TID AC + HS    MD- Patient stated she needs a new CBG meter at time of discharge.  Please provide with Rx  Order #[id number]   Note Lantus and Novolog both to start today.    Spoke with patient about her current A1c of 9.5%.  Explained what an A1c is and what it measures.  Reminded patient that her goal A1c is 7% or less per ADA standards to prevent both acute and long-term complications, however, I also praised pt for getting her A1c down from 13.8% last year.  Pt was happy to hear that she was making some progress.    Explained to patient the extreme importance of good glucose control at home.  Encouraged patient to check her CBGs at least TID AC at home and to record all CBGs in a logbook for her PCP  to review.  Patient told me she has had lots of difficulty managing her CBGs at home.  Has been undergoing a lot of pain and stress and stated she has a really hard time eating given her throat cancer.  Stated to me she takes small bites and sips throughout the day and needs soft foods.  Told me she knows she's not supposed to drink regular  soda and sweet tea, however, she can barely eat and told me she likes to sip on these drinks to get a little sugar in her system since she takes insulin.  Pt told me she does not want her CBGs to drop below 150 mg/dl.  Feels terrible when she has Hypo events and stated to me it takes her hours to recuperate physically from HYPO events.  We agreed upon a goal CBG of 150-200 mg/dl for in hospital.  I spoke w/ pt's RN and RN to allow pt to have a regular soda every now and then for comfort.     --Will follow patient during hospitalization--  Wyn Quaker RN, MSN, CDE Diabetes Coordinator Inpatient Glycemic Control Team Team Pager: [phone] (8a-5p)

## 2018-07-29 NOTE — Progress Notes (Signed)
Pnt refused bed alarm and SCD's. Educated on importance of each but patient still refused.

## 2018-07-29 NOTE — Progress Notes (Signed)
Advanced care plan. Purpose of the Encounter: CODE STATUS Parties in Attendance: Patient Patient's Decision Capacity: Good Subjective/Patient's story: Katie Guzman  is a 48 y.o. female with a known history of poorly controlled diabetes mellitus, osteomyelitis with left great toe amputation, COPD, tobacco abuse, history of throat cancer.  She presented to the emergency room complaining of increased edema and erythema surrounding her right great toe and plantar surface wound.  Patient reports a 71-month history of increasing pain and intermittent drainage from the right plantar surface wound.  However over the last week she has noticed increased erythema and edema surrounding the area as well as purulent drainage.  She noticed a new area of purulent drainage on the left lateral side of her right great toe this a.m. with increased pain.  Pain is made considerably worse with attempting to ambulate.  Patient has noted fevers and chills subjectively.  She denies nausea, vomiting, diarrhea.  She denies abdominal pain.  She denies shortness of breath or chest pain.  However, patient continues to smoke 1/2 pack cigarettes per day. X-ray of right foot demonstrates soft tissue edema.  Lower extremity venous ultrasounds completed with no evidence of DVT.  Elevated WBC 12.2.  Admitted to the hospitalist service for further management. Objective/Medical story Patient needs podiatry evaluation and broad-spectrum antibiotics.  They are to evaluate if she needs any debridement and incision and drainage Goals of care determination:  Advance care directives goals of care and treatment plan discussed Patient wants CPR, intubation ventilator if the need arises CODE STATUS: Full code Time spent discussing advanced care planning: 16 minutes

## 2018-07-29 NOTE — Consult Note (Signed)
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  X ray negative for osteomyelitis.  History amputation left first and second toes.  Fourth metatarsal with dark scab to tip.   Wound type:neuropathic Pressure Injury POA: NA Measurement:Right great toe:  1 cm x 1.3 cm x 0.2 cm with callous to periwound LEft fourth toe:  0.3 cm scab to tip of toe.  Wound bed: pale nongranulating Drainage (amount, consistency, odor) minimal serosanguinous  No odor Periwound:callous Dressing procedure/placement/frequency:Cleanse wound to left and right  toes with NS.  Cover with Xeroform gauze and dry dressing. Change daily.  Will not follow at this time.  Please re-consult if needed.  Domenic Moras MSN, RN, FNP-BC CWON Wound, Ostomy, Continence Nurse Pager [phone]

## 2018-07-30 LAB — CBC
HCT: 43.7 % (ref 36.0–46.0)
Hemoglobin: 13.8 g/dL (ref 12.0–15.0)
MCH: 27.8 pg (ref 26.0–34.0)
MCHC: 31.6 g/dL (ref 30.0–36.0)
MCV: 87.9 fL (ref 80.0–100.0)
Platelets: 424 10*3/uL — ABNORMAL HIGH (ref 150–400)
RBC: 4.97 MIL/uL (ref 3.87–5.11)
RDW: 14.3 % (ref 11.5–15.5)
WBC: 9.5 10*3/uL (ref 4.0–10.5)
nRBC: 0 % (ref 0.0–0.2)

## 2018-07-30 LAB — GLUCOSE, CAPILLARY
Glucose-Capillary: 277 mg/dL — ABNORMAL HIGH (ref 70–99)
Glucose-Capillary: 168 mg/dL — ABNORMAL HIGH (ref 70–99)
Glucose-Capillary: 207 mg/dL — ABNORMAL HIGH (ref 70–99)
Glucose-Capillary: 144 mg/dL — ABNORMAL HIGH (ref 70–99)

## 2018-07-30 LAB — HIV ANTIBODY (ROUTINE TESTING W REFLEX): HIV Screen 4th Generation wRfx: NONREACTIVE

## 2018-07-30 MED ORDER — OXYCODONE-ACETAMINOPHEN 5-325 MG PO TABS
1.0000 | ORAL_TABLET | Freq: Three times a day (TID) | ORAL | Status: DC | PRN
  Administered 2018-07-30 – 2018-07-31 (×2): 1 via ORAL
  Filled 2018-07-30 (×3): qty 1

## 2018-07-30 MED ORDER — ALPRAZOLAM 0.25 MG PO TABS
0.2500 mg | ORAL_TABLET | Freq: Three times a day (TID) | ORAL | Status: DC | PRN
  Administered 2018-07-30 – 2018-07-31 (×2): 0.25 mg via ORAL
  Filled 2018-07-30 (×3): qty 1

## 2018-07-30 NOTE — Progress Notes (Signed)
   07/30/18 1900  Clinical Encounter Type  Visited With Patient  Visit Type Initial  Referral From Nurse  Stress Factors  Patient Stress Factors Family relationships;Financial concerns;Health changes;Lack of caregivers;Loss  Family Stress Factors Family relationships;Financial concerns;Health changes;Lack of caregivers;Major life changes  Ch was called for emotional support. Pt is in deep distress about finding a home for her and her family. Pt has two daughters to whom she has always been the emotional supporter and caregiver. Now that she can't fulfill that role anymore, there exists tension between her and her daughters. Pt wants to model values such loyalty, love, care in her relationship with her daughters but with her progressing illness she finds being a caregiver no longer feasible. Pt expressed tiredness from not receiving enough emotional support from her family. Pt also grieved the loss of her husband and the change in her physical appearance due to radiation therapy. A consult with social worker already took place. Ch prayed with the pt for strength and for an insight where pt can practice the act of loving oneself.

## 2018-07-30 NOTE — Progress Notes (Signed)
[facility] [place] NAME: Katie Guzman    MR#:  [id number]  DATE OF BIRTH:  1970/07/01  SUBJECTIVE:  CHIEF COMPLAINT:   Chief Complaint  Patient presents with  . Foot Swelling  Patient seen and evaluated today Has right foot pain .No fever Status post local debridement by podiatry yesterday and had dressing on right foot.  REVIEW OF SYSTEMS:    ROS  CONSTITUTIONAL: No documented fever. No fatigue, weakness. No weight gain, no weight loss.  EYES: No blurry or double vision.  ENT: No tinnitus. No postnasal drip. No redness of the oropharynx.  RESPIRATORY: No cough, no wheeze, no hemoptysis. No dyspnea.  CARDIOVASCULAR: No chest pain. No orthopnea. No palpitations. No syncope.  GASTROINTESTINAL: No nausea, no vomiting or diarrhea. No abdominal pain. No melena or hematochezia.  GENITOURINARY: No dysuria or hematuria.  ENDOCRINE: No polyuria or nocturia. No heat or cold intolerance.  HEMATOLOGY: No anemia. No bruising. No bleeding.  INTEGUMENTARY: No rashes.  Right foot wound MUSCULOSKELETAL: No arthritis.  Right foot swelling. No gout.  NEUROLOGIC: No numbness, tingling, or ataxia. No seizure-type activity.  PSYCHIATRIC: No anxiety. No insomnia. No ADD.   DRUG ALLERGIES:   Allergies  Allergen Reactions  . Flexeril [Cyclobenzaprine] Hives and Nausea And Vomiting  . Hydrocodone Hives  . Other Hives  . Darvocet [Propoxyphene N-Acetaminophen] Hives and Nausea And Vomiting  . Fish-Derived Products Other (See Comments)    Patient does not like to eat fish. Not allergic but prefers not to.  . Propofol Nausea And Vomiting  . Toradol [Ketorolac Tromethamine] Nausea And Vomiting  . Vicodin [Hydrocodone-Acetaminophen] Hives and Nausea And Vomiting  . Ketorolac Nausea And Vomiting  . Tramadol Nausea And Vomiting    Vomiting only    VITALS:  Blood pressure 128/78, pulse 85, temperature 98.4 F (36.9 C), temperature source Oral, resp.  rate 18, height 5' (1.524 m), weight 52.2 kg, SpO2 95 %.  PHYSICAL EXAMINATION:   Physical Exam  GENERAL:  47 y.o.-year-old patient lying in the bed with no acute distress.  EYES: Pupils equal, round, reactive to light and accommodation. No scleral icterus. Extraocular muscles intact.  HEENT: Head atraumatic, normocephalic. Oropharynx and nasopharynx clear.  NECK:  Supple, no jugular venous distention. No thyroid enlargement, no tenderness.  LUNGS: Normal breath sounds bilaterally, no wheezing, rales, rhonchi. No use of accessory muscles of respiration.  CARDIOVASCULAR: S1, S2 normal. No murmurs, rubs, or gallops.  ABDOMEN: Soft, nontender, nondistended. Bowel sounds present. No organomegaly or mass.  EXTREMITIES: No cyanosis, clubbing or edema b/l.    Right foot ulcer NEUROLOGIC: Cranial nerves II through XII are intact. No focal Motor or sensory deficits b/l.   PSYCHIATRIC: The patient is alert and oriented x 3.  SKIN: Right foot ulcer-status post local debridement by podiatry and had a dressing on the right foot.    LABORATORY PANEL:   CBC Recent Labs  Lab 07/30/18 0503  WBC 9.5  HGB 13.8  HCT 43.7  PLT 424*   ------------------------------------------------------------------------------------------------------------------ Chemistries  Recent Labs  Lab 07/28/18 1704 07/29/18 0404  NA 136 136  K 4.0 3.7  CL 96* 96*  CO2 29 30  GLUCOSE 218* 337*  BUN 22* 20  CREATININE 0.88 0.90  CALCIUM 9.5 9.1  AST 11*  --   ALT 7  --   ALKPHOS 85  --   BILITOT 0.4  --    ------------------------------------------------------------------------------------------------------------------  Cardiac Enzymes No results for input(s): TROPONINI in  the last 168 hours. ------------------------------------------------------------------------------------------------------------------  RADIOLOGY:  Dg Chest 2 View  Result Date: 07/28/2018 CLINICAL DATA:  Right foot redness EXAM: CHEST  - 2 VIEW COMPARISON:  11/18/2014 FINDINGS: The heart size and mediastinal contours are within normal limits. Both lungs are clear. The visualized skeletal structures are unremarkable. IMPRESSION: No active cardiopulmonary disease. Electronically Signed   By: Donavan Foil M.D.   On: 07/28/2018 18:10   US Venous Img Lower Unilateral Right  Result Date: 07/28/2018 CLINICAL DATA:  Right lower extremity pain and swelling. EXAM: Right LOWER EXTREMITY VENOUS DOPPLER ULTRASOUND TECHNIQUE: Gray-scale sonography with graded compression, as well as color Doppler and duplex ultrasound were performed to evaluate the lower extremity deep venous systems from the level of the common femoral vein and including the common femoral, femoral, profunda femoral, popliteal and calf veins including the posterior tibial, peroneal and gastrocnemius veins when visible. The superficial great saphenous vein was also interrogated. Spectral Doppler was utilized to evaluate flow at rest and with distal augmentation maneuvers in the common femoral, femoral and popliteal veins. COMPARISON:  None. FINDINGS: Contralateral Common Femoral Vein: Respiratory phasicity is normal and symmetric with the symptomatic side. No evidence of thrombus. Normal compressibility. Common Femoral Vein: No evidence of thrombus. Normal compressibility, respiratory phasicity and response to augmentation. Saphenofemoral Junction: No evidence of thrombus. Normal compressibility and flow on color Doppler imaging. Profunda Femoral Vein: No evidence of thrombus. Normal compressibility and flow on color Doppler imaging. Femoral Vein: No evidence of thrombus. Normal compressibility, respiratory phasicity and response to augmentation. Popliteal Vein: No evidence of thrombus. Normal compressibility, respiratory phasicity and response to augmentation. Calf Veins: No evidence of thrombus. Normal compressibility and flow on color Doppler imaging. Superficial Great Saphenous Vein:  No evidence of thrombus. Normal compressibility. Venous Reflux:  None. Other Findings: There are prominent right inguinal lymph nodes that maintaining normal appearing morphology. IMPRESSION: 1. No DVT identified. 2. Prominent right inguinal lymph nodes that appear morphologically normal and are likely reactive to a right lower extremity process. Electronically Signed   By: Constance Holster M.D.   On: 07/28/2018 21:15   Dg Foot Complete Right  Result Date: 07/28/2018 CLINICAL DATA:  Right foot redness EXAM: RIGHT FOOT COMPLETE - 3+ VIEW COMPARISON:  07/29/2015 FINDINGS: No acute displaced fracture or malalignment. Small plantar calcaneal spur. Old fracture deformity of the fifth metatarsal with cystic lucency in the distal shaft. Plantar soft tissue swelling with ulceration at the level of the MTP joints. IMPRESSION: 1. No acute osseous abnormality 2. Plantar soft tissue swelling with ulceration 3. Chronic deformity fifth metatarsal Electronically Signed   By: Donavan Foil M.D.   On: 07/28/2018 18:12     ASSESSMENT AND PLAN:   48 year old female patient with history of type 2 diabetes mellitus, COPD, tobacco abuse currently under hospitalist service  -Right foot cellulitis Right foot diabetic foot ulcer Continue IV vancomycin and Unasyn antibiotics Wound care consult Podiatry consult appreciated status post debridement, he does not feel this is a deep infection and advised to change to oral antibiotics and discharge Follow-up cultures DVT studies are negative  -Emphysema Stable Continue home dose inhalers and neb treatments  -Tobacco abuse Tobacco cessation counseled to the patient for 6 minutes Nicotine patch offered  -Type 2 diabetes mellitus Diabetic diet Lantus insulin with sliding scale coverage insulin Her hemoglobin A1c level is high.  All the records are reviewed and case discussed with Care Management/Social Worker. Management plans discussed with the patient, family and  they are  in agreement.  CODE STATUS: Full code  DVT Prophylaxis: SCDs  TOTAL TIME TAKING CARE OF THIS PATIENT: 32 minutes.   POSSIBLE D/C IN 2 to 3 DAYS, DEPENDING ON CLINICAL CONDITION.  Vaughan Basta M.D on 07/30/2018 at 4:05 PM  Between 7am to 6pm - Pager - [phone]  After 6pm go to [email or web address] - password [email or web address] [facility]  Office  [phone]  CC: Primary care physician; System, Pcp Not In  Note: This dictation was prepared with Dragon dictation along with smaller phrase technology. Any transcriptional errors that result from this process are unintentional.
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
## 2018-07-31 LAB — GLUCOSE, CAPILLARY: Glucose-Capillary: 287 mg/dL — ABNORMAL HIGH (ref 70–99)

## 2018-07-31 LAB — AEROBIC CULTURE W GRAM STAIN (SUPERFICIAL SPECIMEN): Special Requests: NORMAL

## 2018-07-31 LAB — CREATININE, SERUM
Creatinine, Ser: 0.69 mg/dL (ref 0.44–1.00)
GFR calc Af Amer: >60 mL/min (ref >60)
GFR calc non Af Amer: >60 mL/min (ref >60)

## 2018-07-31 MED ORDER — OXYCODONE-ACETAMINOPHEN 5-325 MG PO TABS
2.0000 | ORAL_TABLET | ORAL | Status: AC
  Administered 2018-07-31: 2 via ORAL
  Filled 2018-07-31: qty 2

## 2018-07-31 MED ORDER — OXYCODONE-ACETAMINOPHEN 5-325 MG PO TABS: 1.0000 | ORAL_TABLET | Freq: Four times a day (QID) | ORAL | 0 refills | Status: DC | PRN

## 2018-07-31 MED ORDER — AMOXICILLIN-POT CLAVULANATE 875-125 MG PO TABS: 1.0000 | ORAL_TABLET | Freq: Two times a day (BID) | ORAL | 0 refills | Status: DC

## 2018-07-31 MED ORDER — INSULIN GLARGINE 100 UNIT/ML ~~LOC~~ SOLN: 25.0000 [IU] | Freq: Every day | SUBCUTANEOUS | 11 refills | Status: DC

## 2018-07-31 MED ORDER — ASCORBIC ACID 250 MG PO TABS: 250.0000 mg | ORAL_TABLET | Freq: Two times a day (BID) | ORAL | 0 refills | Status: DC

## 2018-07-31 MED ORDER — AMOXICILLIN-POT CLAVULANATE 875-125 MG PO TABS: 1.0000 | ORAL_TABLET | Freq: Two times a day (BID) | ORAL | 0 refills | Status: AC

## 2018-07-31 NOTE — Discharge Summary (Signed)
[place] [facility] [place] NAME: Katie Guzman    MR#:  [id number]  DATE OF BIRTH:  October 28, 1970  DATE OF ADMISSION:  07/28/2018 ADMITTING PHYSICIAN: Christel Mormon, MD  DATE OF DISCHARGE: 07/31/2018   PRIMARY CARE PHYSICIAN: System, Pcp Not In    ADMISSION DIAGNOSIS:  Cellulitis of other specified site [L03.818] Diabetic ulcer of right foot associated with diabetes mellitus of other type, with fat layer exposed, unspecified part of foot ([place]) [G86.761, L97.512]  DISCHARGE DIAGNOSIS:  Active Problems:   Diabetic ulcer of right foot associated with diabetes mellitus due to underlying condition ([place])   SECONDARY DIAGNOSIS:   Past Medical History:  Diagnosis Date  . Asthma   . Cancer ([place])    lung  . Cellulitis 04/15/2015  . COPD (chronic obstructive pulmonary disease) ([place])   . Diabetes mellitus    Poorly controlled with complications  . Emphysema   . Fibromyalgia   . Neuropathy   . Tenosynovitis of foot 04/16/2015    HOSPITAL COURSE:   48 year old female patient with history of type 2 diabetes mellitus, COPD, tobacco abuse currently under hospitalist service  -Right foot cellulitis Right foot diabetic foot ulcer Continue IV vancomycin and Unasyn antibiotics Wound care consult Podiatry consult appreciated status post debridement, he does not feel this is a deep infection and advised to change to oral antibiotics and discharge Follow-up cultures- no growth. DVT studies are negative  will give 4 days oral augmentin on d/c.  -Emphysema Stable Continue home dose inhalers and neb treatments  -Tobacco abuse Tobacco cessation counseled to the patient for 6 minutes Nicotine patch offered  -Type 2 diabetes mellitus Diabetic diet Lantus insulin with sliding scale coverage insulin Her hemoglobin A1c level is high. She was taking 18 U lantus daily- increased to 25 U daily.  Social worker spoke to her about  Shelters and helps  discussion.  DISCHARGE CONDITIONS:   Stable.  CONSULTS OBTAINED:    DRUG ALLERGIES:   Allergies  Allergen Reactions  . Flexeril [Cyclobenzaprine] Hives and Nausea And Vomiting  . Hydrocodone Hives  . Other Hives  . Darvocet [Propoxyphene N-Acetaminophen] Hives and Nausea And Vomiting  . Fish-Derived Products Other (See Comments)    Patient does not like to eat fish. Not allergic but prefers not to.  . Propofol Nausea And Vomiting  . Toradol [Ketorolac Tromethamine] Nausea And Vomiting  . Vicodin [Hydrocodone-Acetaminophen] Hives and Nausea And Vomiting  . Ketorolac Nausea And Vomiting  . Tramadol Nausea And Vomiting    Vomiting only    DISCHARGE MEDICATIONS:   Allergies as of 07/31/2018      Reactions   Flexeril [cyclobenzaprine] Hives, Nausea And Vomiting   Hydrocodone Hives   Other Hives   Darvocet [propoxyphene N-acetaminophen] Hives, Nausea And Vomiting   Fish-derived Products Other (See Comments)   Patient does not like to eat fish. Not allergic but prefers not to.   Propofol Nausea And Vomiting   Toradol [ketorolac Tromethamine] Nausea And Vomiting   Vicodin [hydrocodone-acetaminophen] Hives, Nausea And Vomiting   Ketorolac Nausea And Vomiting   Tramadol Nausea And Vomiting   Vomiting only      Medication List    STOP taking these medications   morphine 15 MG tablet Commonly known as: MSIR   oxyCODONE 5 MG immediate release tablet Commonly known as: Oxy IR/ROXICODONE     TAKE these medications   acetaminophen 325 MG tablet Commonly known as: TYLENOL Take 650 mg by mouth  every 6 (six) hours as needed for mild pain.   albuterol 108 (90 Base) MCG/ACT inhaler Commonly known as: VENTOLIN HFA Inhale 2 puffs into the lungs every 4 (four) hours as needed for wheezing or shortness of breath. For wheeze or shortness of breath What changed: when to take this   albuterol (2.5 MG/3ML) 0.083% nebulizer solution Commonly known as: PROVENTIL Take 2.5 mg by  nebulization every 4 (four) hours as needed for wheezing or shortness of breath. What changed: Another medication with the same name was changed. Make sure you understand how and when to take each.   amoxicillin-clavulanate 875-125 MG tablet Commonly known as: Augmentin Take 1 tablet by mouth 2 (two) times daily for 4 days.   ascorbic acid 250 MG tablet Commonly known as: VITAMIN C Take 1 tablet (250 mg total) by mouth 2 (two) times daily.   elvitegravir-cobicistat-emtricitabine-tenofovir 150-150-200-10 MG Tabs tablet Commonly known as: GENVOYA Take 1 tablet by mouth daily with breakfast.   elvitegravir-cobicistat-emtricitabine-tenofovir 150-150-200-10 MG Tabs tablet Commonly known as: GENVOYA Take 1 tablet by mouth daily with breakfast.   fluticasone-salmeterol 115-21 MCG/ACT inhaler Commonly known as: ADVAIR HFA Inhale 2 puffs into the lungs 2 (two) times daily.   gabapentin 800 MG tablet Commonly known as: NEURONTIN Take 1 tablet (800 mg total) by mouth 3 (three) times daily.   ibuprofen 800 MG tablet Commonly known as: ADVIL Take 800 mg by mouth every 6 (six) hours as needed for fever or mild pain.   insulin glargine 100 UNIT/ML injection Commonly known as: LANTUS Inject 0.25 mLs (25 Units total) into the skin at bedtime. What changed: when to take this   insulin lispro 100 UNIT/ML injection Commonly known as: HUMALOG Inject 1-18 Units into the skin 4 (four) times daily -  before meals and at bedtime. Sliding scale   lidocaine 5 % Commonly known as: LIDODERM Place 1 patch onto the skin daily as needed. For pain. Remove & Discard patch within 12 hours or as directed by MD   nicotine 21 mg/24hr patch Commonly known as: NICODERM CQ - dosed in mg/24 hours Place 1 patch (21 mg total) onto the skin daily. What changed:   when to take this  reasons to take this   ondansetron 4 MG disintegrating tablet Commonly known as: Zofran ODT Take 1 tablet (4 mg total) by  mouth every 8 (eight) hours as needed for nausea or vomiting.   oxyCODONE-acetaminophen 5-325 MG tablet Commonly known as: PERCOCET/ROXICET Take 1 tablet by mouth every 6 (six) hours as needed for moderate pain or severe pain.   tiotropium 18 MCG inhalation capsule Commonly known as: SPIRIVA Place 1 capsule (18 mcg total) into inhaler and inhale daily.        DISCHARGE INSTRUCTIONS:    Follow with PMD in 1-2 weeks.  If you experience worsening of your admission symptoms, develop shortness of breath, life threatening emergency, suicidal or homicidal thoughts you must seek medical attention immediately by calling 911 or calling your MD immediately  if symptoms less severe.  You Must read complete instructions/literature along with all the possible adverse reactions/side effects for all the Medicines you take and that have been prescribed to you. Take any new Medicines after you have completely understood and accept all the possible adverse reactions/side effects.   Please note  You were cared for by a hospitalist during your hospital stay. If you have any questions about your discharge medications or the care you received while you were in the  hospital after you are discharged, you can call the unit and asked to speak with the hospitalist on call if the hospitalist that took care of you is not available. Once you are discharged, your primary care physician will handle any further medical issues. Please note that NO REFILLS for any discharge medications will be authorized once you are discharged, as it is imperative that you return to your primary care physician (or establish a relationship with a primary care physician if you do not have one) for your aftercare needs so that they can reassess your need for medications and monitor your lab values.    Today   CHIEF COMPLAINT:   Chief Complaint  Patient presents with  . Foot Swelling    HISTORY OF PRESENT ILLNESS:  Katie Guzman   is a 48 y.o. female with a known history of poorly controlled diabetes mellitus, osteomyelitis with left great toe amputation, COPD, tobacco abuse, history of throat cancer.  She presented to the emergency room complaining of increased edema and erythema surrounding her right great toe and plantar surface wound.  Patient reports a 92-month history of increasing pain and intermittent drainage from the right plantar surface wound.  However over the last week she has noticed increased erythema and edema surrounding the area as well as purulent drainage.  She noticed a new area of purulent drainage on the left lateral side of her right great toe this a.m. with increased pain.  Pain is made considerably worse with attempting to ambulate.  Patient has noted fevers and chills subjectively.  She denies nausea, vomiting, diarrhea.  She denies abdominal pain.  She denies shortness of breath or chest pain.  However, patient continues to smoke 1/2 pack cigarettes per day.  X-ray of right foot demonstrates soft tissue edema.  Lower extremity venous ultrasounds completed with no evidence of DVT.  Elevated WBC 12.2.  Admitted to the hospitalist service for further management.    VITAL SIGNS:  Blood pressure 123/73, pulse 89, temperature 98.5 F (36.9 C), resp. rate 18, height 5' (1.524 m), weight 52.2 kg, SpO2 94 %.  I/O:    Intake/Output Summary (Last 24 hours) at 07/31/2018 0835 Last data filed at 07/30/2018 1908 Gross per 24 hour  Intake 720 ml  Output -  Net 720 ml    PHYSICAL EXAMINATION:   GENERAL:  48 y.o.-year-old patient lying in the bed with no acute distress.  EYES: Pupils equal, round, reactive to light and accommodation. No scleral icterus. Extraocular muscles intact.  HEENT: Head atraumatic, normocephalic. Oropharynx and nasopharynx clear.  NECK:  Supple, no jugular venous distention. No thyroid enlargement, no tenderness.  LUNGS: Normal breath sounds bilaterally, no wheezing, rales,  rhonchi. No use of accessory muscles of respiration.  CARDIOVASCULAR: S1, S2 normal. No murmurs, rubs, or gallops.  ABDOMEN: Soft, nontender, nondistended. Bowel sounds present. No organomegaly or mass.  EXTREMITIES: No cyanosis, clubbing or edema b/l.    Right foot ulcer NEUROLOGIC: Cranial nerves II through XII are intact. No focal Motor or sensory deficits b/l.   PSYCHIATRIC: The patient is alert and oriented x 3.  SKIN: Right foot ulcer-status post local debridement by podiatry and had a dressing on the right foot. DATA REVIEW:   CBC Recent Labs  Lab 07/30/18 0503  WBC 9.5  HGB 13.8  HCT 43.7  PLT 424*    Chemistries  Recent Labs  Lab 07/28/18 1704 07/29/18 0404 07/31/18 0507  NA 136 136  --   K 4.0 3.7  --  CL 96* 96*  --   CO2 29 30  --   GLUCOSE 218* 337*  --   BUN 22* 20  --   CREATININE 0.88 0.90 0.69  CALCIUM 9.5 9.1  --   AST 11*  --   --   ALT 7  --   --   ALKPHOS 85  --   --   BILITOT 0.4  --   --     Cardiac Enzymes No results for input(s): TROPONINI in the last 168 hours.  Microbiology Results  Results for orders placed or performed during the hospital encounter of 07/28/18  SARS Coronavirus 2 (CEPHEID - Performed in [place] hospital lab), Hosp Order     Status: None   Collection Time: 07/28/18 10:12 PM   Specimen: Nasopharyngeal Swab  Result Value Ref Range Status   SARS Coronavirus 2 NEGATIVE NEGATIVE Final    Comment: (NOTE) If result is NEGATIVE SARS-CoV-2 target nucleic acids are NOT DETECTED. The SARS-CoV-2 RNA is generally detectable in upper and lower  respiratory specimens during the acute phase of infection. The lowest  concentration of SARS-CoV-2 viral copies this assay can detect is 250  copies / mL. A negative result does not preclude SARS-CoV-2 infection  and should not be used as the sole basis for treatment or other  patient management decisions.  A negative result may occur with  improper specimen collection / handling,  submission of specimen other  than nasopharyngeal swab, presence of viral mutation(s) within the  areas targeted by this assay, and inadequate number of viral copies  (<250 copies / mL). A negative result must be combined with clinical  observations, patient history, and epidemiological information. If result is POSITIVE SARS-CoV-2 target nucleic acids are DETECTED. The SARS-CoV-2 RNA is generally detectable in upper and lower  respiratory specimens dur ing the acute phase of infection.  Positive  results are indicative of active infection with SARS-CoV-2.  Clinical  correlation with patient history and other diagnostic information is  necessary to determine patient infection status.  Positive results do  not rule out bacterial infection or co-infection with other viruses. If result is PRESUMPTIVE POSTIVE SARS-CoV-2 nucleic acids MAY BE PRESENT.   A presumptive positive result was obtained on the submitted specimen  and confirmed on repeat testing.  While 2019 novel coronavirus  (SARS-CoV-2) nucleic acids may be present in the submitted sample  additional confirmatory testing may be necessary for epidemiological  and / or clinical management purposes  to differentiate between  SARS-CoV-2 and other Sarbecovirus currently known to infect humans.  If clinically indicated additional testing with an alternate test  methodology [phone]) is advised. The SARS-CoV-2 RNA is generally  detectable in upper and lower respiratory sp ecimens during the acute  phase of infection. The expected result is Negative. Fact Sheet for Patients:  StrictlyIdeas.no Fact Sheet for Healthcare Providers: BankingDealers.co.za This test is not yet approved or cleared by the [place] FDA and has been authorized for detection and/or diagnosis of SARS-CoV-2 by FDA under an Emergency Use Authorization (EUA).  This EUA will remain in effect (meaning this test can be  used) for the duration of the COVID-19 declaration under Section 564(b)(1) of the Act, 21 U.S.C. section 360bbb-3(b)(1), unless the authorization is terminated or revoked sooner. Performed at [facility], [address] [place], [place] [address]   Culture, blood (routine x 2)     Status: None (Preliminary result)   Collection Time: 07/29/18  4:04 AM  Specimen: BLOOD  Result Value Ref Range Status   Specimen Description BLOOD BLOOD LEFT ARM  Final   Special Requests   Final    BOTTLES DRAWN AEROBIC ONLY Blood Culture adequate volume   Culture   Final    NO GROWTH 2 DAYS Performed at [facility], [address], [place], [place] [address]    Report Status PENDING  Incomplete  Culture, blood (routine x 2)     Status: None (Preliminary result)   Collection Time: 07/29/18  4:10 AM   Specimen: BLOOD  Result Value Ref Range Status   Specimen Description BLOOD BLOOD RIGHT HAND  Final   Special Requests   Final    BOTTLES DRAWN AEROBIC AND ANAEROBIC Blood Culture adequate volume   Culture   Final    NO GROWTH 2 DAYS Performed at [facility], [address], [place], [place] [address]    Report Status PENDING  Incomplete  Aerobic Culture (superficial specimen)     Status: None (Preliminary result)   Collection Time: 07/29/18 12:15 PM   Specimen: Foot; Wound  Result Value Ref Range Status   Specimen Description   Final    FOOT Performed at [facility], [address] [place], [place] [address]    Special Requests   Final    Normal Performed at [facility], [address] [place], [place] [address]    Gram Stain   Final    FEW WBC PRESENT, PREDOMINANTLY PMN NO ORGANISMS SEEN    Culture   Final    CULTURE REINCUBATED FOR BETTER GROWTH Performed at [facility], [place] [address] [place], [place] [address]    Report Status PENDING  Incomplete    RADIOLOGY:  No results found.  EKG:   Orders placed or  performed during the hospital encounter of 10/19/14  . EKG 12-Lead  . EKG 12-Lead  . EKG      Management plans discussed with the patient, family and they are in agreement.  CODE STATUS:     Code Status Orders  (From admission, onward)         Start     Ordered   07/29/18 0156  Full code  Continuous     07/29/18 0157        Code Status History    Date Active Date Inactive Code Status Order ID Comments User Context   03/28/2017 0206 03/28/2017 1338 Full Code [id number]  Cristy Folks, MD Inpatient   08/23/2016 0507 08/24/2016 0530 Full Code [id number]  Jani Gravel, MD Inpatient   04/15/2015 1411 04/17/2015 1736 Full Code [id number]  Rondel Jumbo, PA-C ED   10/19/2014 1641 10/23/2014 2140 Full Code [id number]  Henreitta Leber, MD Inpatient   Advance Care Planning Activity      TOTAL TIME TAKING CARE OF THIS PATIENT: 35 minutes.    Vaughan Basta M.D on 07/31/2018 at 8:35 AM  Between 7am to 6pm - Pager - [phone]  After 6pm go to [email or web address] - password [email or web address] [place] [facility]  Office  [phone]  CC: Primary care physician; System, Pcp Not In   Note: This dictation was prepared with Dragon dictation along with smaller phrase technology. Any transcriptional errors that result from this process are unintentional.

## 2018-08-03 LAB — CULTURE, BLOOD (ROUTINE X 2)
Culture: NO GROWTH
Culture: NO GROWTH
Special Requests: ADEQUATE
Special Requests: ADEQUATE

## 2018-08-06 ENCOUNTER — Other Ambulatory Visit: Payer: Self-pay

## 2018-08-06 ENCOUNTER — Emergency Department
Admission: EM | Admit: 2018-08-06 | Discharge: 2018-08-06 | Disposition: A | Discharge status: Home / Self Care | Payer: Medicaid Other | Attending: Emergency Medicine | Admitting: Emergency Medicine

## 2018-08-06 ENCOUNTER — Emergency Department: Payer: Medicaid Other

## 2018-08-06 DIAGNOSIS — Z85118 Personal history of other malignant neoplasm of bronchus and lung: Secondary | ICD-10-CM | POA: Diagnosis not present

## 2018-08-06 DIAGNOSIS — E119 Type 2 diabetes mellitus without complications: Secondary | ICD-10-CM | POA: Insufficient documentation

## 2018-08-06 DIAGNOSIS — F17210 Nicotine dependence, cigarettes, uncomplicated: Secondary | ICD-10-CM | POA: Diagnosis not present

## 2018-08-06 DIAGNOSIS — J449 Chronic obstructive pulmonary disease, unspecified: Secondary | ICD-10-CM | POA: Insufficient documentation

## 2018-08-06 DIAGNOSIS — Z794 Long term (current) use of insulin: Secondary | ICD-10-CM | POA: Insufficient documentation

## 2018-08-06 DIAGNOSIS — M79671 Pain in right foot: Secondary | ICD-10-CM | POA: Diagnosis present

## 2018-08-06 DIAGNOSIS — L089 Local infection of the skin and subcutaneous tissue, unspecified: Secondary | ICD-10-CM | POA: Diagnosis not present

## 2018-08-06 LAB — CBC WITH DIFFERENTIAL/PLATELET
Abs Immature Granulocytes: 0.05 10*3/uL (ref 0.00–0.07)
Basophils Absolute: 0.1 10*3/uL (ref 0.0–0.1)
Basophils Relative: 0 %
Eosinophils Absolute: 0.1 10*3/uL (ref 0.0–0.5)
Eosinophils Relative: 1 %
HCT: 41.4 % (ref 36.0–46.0)
Hemoglobin: 13 g/dL (ref 12.0–15.0)
Immature Granulocytes: 0 %
Lymphocytes Relative: 5 %
Lymphs Abs: 0.7 10*3/uL (ref 0.7–4.0)
MCH: 27.4 pg (ref 26.0–34.0)
MCHC: 31.4 g/dL (ref 30.0–36.0)
MCV: 87.3 fL (ref 80.0–100.0)
Monocytes Absolute: 1.1 10*3/uL — ABNORMAL HIGH (ref 0.1–1.0)
Monocytes Relative: 7 %
Neutro Abs: 13 10*3/uL — ABNORMAL HIGH (ref 1.7–7.7)
Neutrophils Relative %: 87 %
Platelets: 527 10*3/uL — ABNORMAL HIGH (ref 150–400)
RBC: 4.74 MIL/uL (ref 3.87–5.11)
RDW: 14.5 % (ref 11.5–15.5)
WBC: 14.9 10*3/uL — ABNORMAL HIGH (ref 4.0–10.5)
nRBC: 0 % (ref 0.0–0.2)

## 2018-08-06 LAB — BASIC METABOLIC PANEL
Anion gap: 11 (ref 5–15)
BUN: 26 mg/dL — ABNORMAL HIGH (ref 6–20)
CO2: 28 mmol/L (ref 22–32)
Calcium: 9.5 mg/dL (ref 8.9–10.3)
Chloride: 95 mmol/L — ABNORMAL LOW (ref 98–111)
Creatinine, Ser: 0.86 mg/dL (ref 0.44–1.00)
GFR calc Af Amer: >60 mL/min (ref >60)
GFR calc non Af Amer: >60 mL/min (ref >60)
Glucose, Bld: 240 mg/dL — ABNORMAL HIGH (ref 70–99)
Potassium: 4.1 mmol/L (ref 3.5–5.1)
Sodium: 134 mmol/L — ABNORMAL LOW (ref 135–145)

## 2018-08-06 MED ORDER — FENTANYL CITRATE (PF) 100 MCG/2ML IJ SOLN
50.0000 ug | Freq: Once | INTRAMUSCULAR | Status: AC
  Administered 2018-08-06: 50 ug via INTRAVENOUS
  Filled 2018-08-06: qty 2

## 2018-08-06 MED ORDER — AMOXICILLIN-POT CLAVULANATE 875-125 MG PO TABS: 1.0000 | ORAL_TABLET | Freq: Two times a day (BID) | ORAL | 0 refills | Status: AC

## 2018-08-06 MED ORDER — OXYCODONE-ACETAMINOPHEN 5-325 MG PO TABS
1.0000 | ORAL_TABLET | Freq: Once | ORAL | Status: AC
  Administered 2018-08-06: 1 via ORAL
  Filled 2018-08-06: qty 1

## 2018-08-06 MED ORDER — VANCOMYCIN HCL IN DEXTROSE 1-5 GM/200ML-% IV SOLN
1000.0000 mg | Freq: Once | INTRAVENOUS | Status: AC
  Administered 2018-08-06: 1000 mg via INTRAVENOUS
  Filled 2018-08-06: qty 200

## 2018-08-06 NOTE — ED Triage Notes (Signed)
Patient arrived from home by [facility]. Patient was resently admitted for right foot infection. Since being discharged foot has became increasingly red, swollen and painful. Patient has history of stage 3 throat cancer and is currently receiving radiation treatments.

## 2018-08-06 NOTE — Discharge Instructions (Signed)
Please seek medical attention for any high fevers, chest pain, shortness of breath, change in behavior, persistent vomiting, bloody stool or any other new or concerning symptoms.  

## 2018-08-06 NOTE — ED Provider Notes (Signed)
[facility] Emergency Department Provider Note  ____________________________________________   I have reviewed the triage vital signs and the nursing notes.   HISTORY  Chief Complaint Foot Pain   History limited by: Not Limited   HPI Katie Guzman is a 48 y.o. female who presents to the emergency department today because of concern for right foot pain and swelling. The patient recently had an admission for infection to her right foot. She states she did finish the antibiotics that were prescribed to her at time of discharge. For the past couple of days she says that the foot has started to swell and she has had worsening pain and some redness to that foot. The patient denies any measured fevers.   Records reviewed. Per medical record review patient has a history of recent admission for right foot infection.   Past Medical History:  Diagnosis Date  . Asthma   . Cancer ([place])    lung  . Cellulitis 04/15/2015  . COPD (chronic obstructive pulmonary disease) ([place])   . Diabetes mellitus    Poorly controlled with complications  . Emphysema   . Fibromyalgia   . Neuropathy   . Tenosynovitis of foot 04/16/2015    Patient Active Problem List   Diagnosis Date Noted  . Diabetic ulcer of right foot associated with diabetes mellitus due to underlying condition ([place]) 07/29/2018  . Diabetic foot infection ([place]) 03/27/2017  . Skin ulcer ([place]) 08/23/2016  . Diabetic foot ulcer ([place]) 08/23/2016  . Diabetic ulcer of toe of left foot associated with type 2 diabetes mellitus ([place])   . 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 Tenosynovitis of foot 04/16/2015  . Hypokalemia 04/16/2015  . Cellulitis 04/15/2015  . Diabetes mellitus ([place]) 04/15/2015  . Pain syndrome, chronic 04/15/2015  . COPD (chronic obstructive pulmonary disease) ([place]) 04/15/2015  . Diabetic neuropathy ([place]) 04/15/2015  . Diabetes mellitus with complication ([place])   . COPD  exacerbation ([place]) 10/19/2014    Past Surgical History:  Procedure Laterality Date  . BACK SURGERY    . CESAREAN SECTION    . TONSILLECTOMY    . TUBAL LIGATION      Prior to Admission medications   Medication Sig Start Date End Date Taking? Authorizing Provider  acetaminophen (TYLENOL) 325 MG tablet Take 650 mg by mouth every 6 (six) hours as needed for mild pain.    [provider]  albuterol (PROVENTIL HFA;VENTOLIN HFA) 108 (90 BASE) MCG/ACT inhaler Inhale 2 puffs into the lungs every 4 (four) hours as needed for wheezing or shortness of breath. For wheeze or shortness of breath Patient taking differently: Inhale 2 puffs into the lungs 2 (two) times a day. For wheeze or shortness of breath 10/23/14   Gouru, Illene Silver, MD  albuterol (PROVENTIL) (2.5 MG/3ML) 0.083% nebulizer solution Take 2.5 mg by nebulization every 4 (four) hours as needed for wheezing or shortness of breath.     [provider]  ascorbic acid (VITAMIN C) 250 MG tablet Take 1 tablet (250 mg total) by mouth 2 (two) times daily. 07/31/18   Vaughan Basta, MD  elvitegravir-cobicistat-emtricitabine-tenofovir (GENVOYA) 150-150-200-10 MG TABS tablet Take 1 tablet by mouth daily with breakfast. Patient not taking: Reported on 01/12/2018 07/31/17   Lavonia Drafts, MD  elvitegravir-cobicistat-emtricitabine-tenofovir (GENVOYA) 150-150-200-10 MG TABS tablet Take 1 tablet by mouth daily with breakfast. Patient not taking: Reported on 01/12/2018 07/31/17   Lavonia Drafts, MD  fluticasone-salmeterol (ADVAIR HFA) [phone] MCG/ACT inhaler Inhale 2 puffs into the  lungs 2 (two) times daily. 10/23/14   Nicholes Mango, MD  gabapentin (NEURONTIN) 800 MG tablet Take 1 tablet (800 mg total) by mouth 3 (three) times daily. 04/01/17   Edrick Kins, DPM  ibuprofen (ADVIL) 800 MG tablet Take 800 mg by mouth every 6 (six) hours as needed for fever or mild pain.     [provider]  insulin glargine (LANTUS) 100 UNIT/ML injection  Inject 0.25 mLs (25 Units total) into the skin at bedtime. 07/31/18   Vaughan Basta, MD  insulin lispro (HUMALOG) 100 UNIT/ML injection Inject 1-18 Units into the skin 4 (four) times daily -  before meals and at bedtime. Sliding scale  07/11/16 07/28/18  [provider]  lidocaine (LIDODERM) 5 % Place 1 patch onto the skin daily as needed. For pain. Remove & Discard patch within 12 hours or as directed by MD    [provider]  nicotine (NICODERM CQ - DOSED IN MG/24 HOURS) 21 mg/24hr patch Place 1 patch (21 mg total) onto the skin daily. Patient taking differently: Place 21 mg onto the skin daily as needed (smoking cessation).  10/23/14   Gouru, Illene Silver, MD  ondansetron (ZOFRAN ODT) 4 MG disintegrating tablet Take 1 tablet (4 mg total) by mouth every 8 (eight) hours as needed for nausea or vomiting. Patient not taking: Reported on 07/28/2018 12/08/15   Carrie Mew, MD  oxyCODONE-acetaminophen (PERCOCET/ROXICET) 5-325 MG tablet Take 1 tablet by mouth every 6 (six) hours as needed for moderate pain or severe pain. 07/31/18   Vaughan Basta, MD  tiotropium (SPIRIVA) 18 MCG inhalation capsule Place 1 capsule (18 mcg total) into inhaler and inhale daily. 10/23/14   Nicholes Mango, MD    Allergies Flexeril [cyclobenzaprine], Hydrocodone, Other, Darvocet [propoxyphene n-acetaminophen], Fish-derived products, Propofol, Toradol [ketorolac tromethamine], Vicodin [hydrocodone-acetaminophen], Ketorolac, and Tramadol  Family History  Problem Relation Age of Onset  . Lung cancer Mother     Social History Social History   Tobacco Use  . Smoking status: Current Some Day Smoker    Packs/day: 0.50    Years: 30.00    Pack years: 15.00    Types: Cigarettes    Last attempt to quit: 10/05/2014    Years since quitting: 3.8  . Smokeless tobacco: Never Used  Substance Use Topics  . Alcohol use: No  . Drug use: No    Review of Systems Constitutional: No fever/chills Eyes: No  visual changes. ENT: No sore throat. Cardiovascular: Denies chest pain. Respiratory: Denies shortness of breath. Gastrointestinal: No abdominal pain.  No nausea, no vomiting.  No diarrhea.   Genitourinary: Negative for dysuria. Musculoskeletal: Positive for right foot swelling and pain.  Skin: Positive for redness to right foot.  Neurological: Negative for headaches, focal weakness or numbness.  ____________________________________________   PHYSICAL EXAM:  VITAL SIGNS: ED Triage Vitals  Enc Vitals Group     BP --      Pulse Rate 08/06/18 0109 99     Resp 08/06/18 0109 20     Temp 08/06/18 0109 98.9 F (37.2 C)     Temp Source 08/06/18 0109 Oral     SpO2 08/06/18 0106 92 %     Weight 08/06/18 0112 115 lb (52.2 kg)     Height 08/06/18 0112 5' (1.524 m)     Head Circumference --      Peak Flow --      Pain Score 08/06/18 0111 10   Constitutional: Alert and oriented.  Eyes: Conjunctivae are normal.  ENT      Head: Normocephalic and atraumatic.      Nose: No congestion/rhinnorhea.      Mouth/Throat: Mucous membranes are moist.      Neck: No stridor. Hematological/Lymphatic/Immunilogical: No cervical lymphadenopathy. Cardiovascular: Normal rate, regular rhythm.  No murmurs, rubs, or gallops.  Respiratory: Normal respiratory effort without tachypnea nor retractions. Breath sounds are clear and equal bilaterally. No wheezes/rales/rhonchi. Gastrointestinal: Soft and non tender. No rebound. No guarding.  Genitourinary: Deferred Musculoskeletal: Right distal foot with erythema, swelling, warmth and tenderness. DP 2+ Neurologic:  Normal speech and language. No gross focal neurologic deficits are appreciated.  Skin:  Skin is warm, dry and intact. No rash noted. Psychiatric: Mood and affect are normal. Speech and behavior are normal. Patient exhibits appropriate insight and judgment.  ____________________________________________    LABS (pertinent positives/negatives)  BMP na  134, k 4.1, glu 240, cr 0.86 CBC wbc 14.9, hgb 13.0, plt 527  ____________________________________________   EKG  None  ____________________________________________    RADIOLOGY  Right foot No definitive osteomyelitis  ____________________________________________   PROCEDURES  Procedures  ____________________________________________   INITIAL IMPRESSION / ASSESSMENT AND PLAN / ED COURSE  Pertinent labs & imaging results that were available during my care of the patient were reviewed by me and considered in my medical decision making (see chart for details).   Presented to the emergency department today because of concerns for pain and swelling to her right foot.  On exam patient does have erythema and warmth as well swelling to the distal right foot.  Patient had a recent admission for similar symptoms.  Patient did have elevation of her white count today.  Do have concerns for recurrent infection.  I discussed this with the patient.  Patient was given dose of IV antibiotics here in the emergency department.  I did recommend admission to the patient.  She however stated that she cannot be admitted due to family issues.  She says she does have follow-up already scheduled with podiatry early next week.  Will plan on discharging with prescription for further antibiotics.  ____________________________________________   FINAL CLINICAL IMPRESSION(S) / ED DIAGNOSES  Final diagnoses:  Right foot infection     Note: This dictation was prepared with Dragon dictation. Any transcriptional errors that result from this process are unintentional     Nance Pear, MD 08/06/18 [phone]

## 2018-08-18 ENCOUNTER — Other Ambulatory Visit: Payer: Self-pay

## 2018-08-18 ENCOUNTER — Inpatient Hospital Stay
Admission: AD | Admit: 2018-08-18 | Discharge: 2018-08-25 | DRG: 617 | Disposition: A | Discharge status: Home / Self Care | Payer: Medicaid Other | Attending: Internal Medicine | Admitting: Internal Medicine

## 2018-08-18 ENCOUNTER — Inpatient Hospital Stay: Payer: Medicaid Other

## 2018-08-18 DIAGNOSIS — Z801 Family history of malignant neoplasm of trachea, bronchus and lung: Secondary | ICD-10-CM

## 2018-08-18 DIAGNOSIS — M7752 Other enthesopathy of left foot: Secondary | ICD-10-CM

## 2018-08-18 DIAGNOSIS — E1165 Type 2 diabetes mellitus with hyperglycemia: Secondary | ICD-10-CM | POA: Diagnosis present

## 2018-08-18 DIAGNOSIS — E1142 Type 2 diabetes mellitus with diabetic polyneuropathy: Secondary | ICD-10-CM | POA: Diagnosis present

## 2018-08-18 DIAGNOSIS — K5900 Constipation, unspecified: Secondary | ICD-10-CM | POA: Diagnosis present

## 2018-08-18 DIAGNOSIS — Z8739 Personal history of other diseases of the musculoskeletal system and connective tissue: Secondary | ICD-10-CM | POA: Diagnosis not present

## 2018-08-18 DIAGNOSIS — Z590 Homelessness: Secondary | ICD-10-CM | POA: Diagnosis not present

## 2018-08-18 DIAGNOSIS — Z8619 Personal history of other infectious and parasitic diseases: Secondary | ICD-10-CM | POA: Diagnosis not present

## 2018-08-18 DIAGNOSIS — Z9221 Personal history of antineoplastic chemotherapy: Secondary | ICD-10-CM | POA: Diagnosis not present

## 2018-08-18 DIAGNOSIS — Z89412 Acquired absence of left great toe: Secondary | ICD-10-CM | POA: Diagnosis not present

## 2018-08-18 DIAGNOSIS — L97519 Non-pressure chronic ulcer of other part of right foot with unspecified severity: Secondary | ICD-10-CM | POA: Diagnosis present

## 2018-08-18 DIAGNOSIS — E1169 Type 2 diabetes mellitus with other specified complication: Principal | ICD-10-CM | POA: Diagnosis present

## 2018-08-18 DIAGNOSIS — Z1159 Encounter for screening for other viral diseases: Secondary | ICD-10-CM

## 2018-08-18 DIAGNOSIS — L089 Local infection of the skin and subcutaneous tissue, unspecified: Secondary | ICD-10-CM | POA: Diagnosis not present

## 2018-08-18 DIAGNOSIS — Z7951 Long term (current) use of inhaled steroids: Secondary | ICD-10-CM | POA: Diagnosis not present

## 2018-08-18 DIAGNOSIS — J449 Chronic obstructive pulmonary disease, unspecified: Secondary | ICD-10-CM | POA: Diagnosis present

## 2018-08-18 DIAGNOSIS — Z923 Personal history of irradiation: Secondary | ICD-10-CM | POA: Diagnosis not present

## 2018-08-18 DIAGNOSIS — K219 Gastro-esophageal reflux disease without esophagitis: Secondary | ICD-10-CM | POA: Diagnosis present

## 2018-08-18 DIAGNOSIS — B966 Bacteroides fragilis [B. fragilis] as the cause of diseases classified elsewhere: Secondary | ICD-10-CM | POA: Diagnosis not present

## 2018-08-18 DIAGNOSIS — E11628 Type 2 diabetes mellitus with other skin complications: Secondary | ICD-10-CM | POA: Diagnosis not present

## 2018-08-18 DIAGNOSIS — Z79899 Other long term (current) drug therapy: Secondary | ICD-10-CM

## 2018-08-18 DIAGNOSIS — E11621 Type 2 diabetes mellitus with foot ulcer: Secondary | ICD-10-CM | POA: Diagnosis present

## 2018-08-18 DIAGNOSIS — M79671 Pain in right foot: Secondary | ICD-10-CM | POA: Diagnosis present

## 2018-08-18 DIAGNOSIS — L03115 Cellulitis of right lower limb: Secondary | ICD-10-CM | POA: Diagnosis present

## 2018-08-18 DIAGNOSIS — M869 Osteomyelitis, unspecified: Secondary | ICD-10-CM | POA: Diagnosis present

## 2018-08-18 DIAGNOSIS — M797 Fibromyalgia: Secondary | ICD-10-CM | POA: Diagnosis present

## 2018-08-18 DIAGNOSIS — Z720 Tobacco use: Secondary | ICD-10-CM | POA: Diagnosis not present

## 2018-08-18 DIAGNOSIS — F17200 Nicotine dependence, unspecified, uncomplicated: Secondary | ICD-10-CM | POA: Diagnosis not present

## 2018-08-18 DIAGNOSIS — Z872 Personal history of diseases of the skin and subcutaneous tissue: Secondary | ICD-10-CM | POA: Diagnosis not present

## 2018-08-18 DIAGNOSIS — E118 Type 2 diabetes mellitus with unspecified complications: Secondary | ICD-10-CM | POA: Diagnosis not present

## 2018-08-18 DIAGNOSIS — Z794 Long term (current) use of insulin: Secondary | ICD-10-CM | POA: Diagnosis not present

## 2018-08-18 DIAGNOSIS — Z89421 Acquired absence of other right toe(s): Secondary | ICD-10-CM | POA: Diagnosis not present

## 2018-08-18 DIAGNOSIS — Z89411 Acquired absence of right great toe: Secondary | ICD-10-CM | POA: Diagnosis not present

## 2018-08-18 DIAGNOSIS — C329 Malignant neoplasm of larynx, unspecified: Secondary | ICD-10-CM | POA: Diagnosis present

## 2018-08-18 DIAGNOSIS — F17210 Nicotine dependence, cigarettes, uncomplicated: Secondary | ICD-10-CM | POA: Diagnosis present

## 2018-08-18 DIAGNOSIS — Z89422 Acquired absence of other left toe(s): Secondary | ICD-10-CM | POA: Diagnosis not present

## 2018-08-18 LAB — CBC WITH DIFFERENTIAL/PLATELET
Abs Immature Granulocytes: 0.03 10*3/uL (ref 0.00–0.07)
Basophils Absolute: 0.1 10*3/uL (ref 0.0–0.1)
Basophils Relative: 1 %
Eosinophils Absolute: 0.2 10*3/uL (ref 0.0–0.5)
Eosinophils Relative: 2 %
HCT: 39.2 % (ref 36.0–46.0)
Hemoglobin: 12.2 g/dL (ref 12.0–15.0)
Immature Granulocytes: 0 %
Lymphocytes Relative: 5 %
Lymphs Abs: 0.6 10*3/uL — ABNORMAL LOW (ref 0.7–4.0)
MCH: 27.7 pg (ref 26.0–34.0)
MCHC: 31.1 g/dL (ref 30.0–36.0)
MCV: 89.1 fL (ref 80.0–100.0)
Monocytes Absolute: 0.8 10*3/uL (ref 0.1–1.0)
Monocytes Relative: 7 %
Neutro Abs: 9.8 10*3/uL — ABNORMAL HIGH (ref 1.7–7.7)
Neutrophils Relative %: 85 %
Platelets: 480 10*3/uL — ABNORMAL HIGH (ref 150–400)
RBC: 4.4 MIL/uL (ref 3.87–5.11)
RDW: 14.2 % (ref 11.5–15.5)
WBC: 11.4 10*3/uL — ABNORMAL HIGH (ref 4.0–10.5)
nRBC: 0 % (ref 0.0–0.2)

## 2018-08-18 LAB — COMPREHENSIVE METABOLIC PANEL
ALT: 7 U/L (ref 0–44)
AST: 11 U/L — ABNORMAL LOW (ref 15–41)
Albumin: 3.1 g/dL — ABNORMAL LOW (ref 3.5–5.0)
Alkaline Phosphatase: 100 U/L (ref 38–126)
Anion gap: 7 (ref 5–15)
BUN: 16 mg/dL (ref 6–20)
CO2: 30 mmol/L (ref 22–32)
Calcium: 8.6 mg/dL — ABNORMAL LOW (ref 8.9–10.3)
Chloride: 98 mmol/L (ref 98–111)
Creatinine, Ser: 0.62 mg/dL (ref 0.44–1.00)
GFR calc Af Amer: >60 mL/min (ref >60)
GFR calc non Af Amer: >60 mL/min (ref >60)
Glucose, Bld: 185 mg/dL — ABNORMAL HIGH (ref 70–99)
Potassium: 4 mmol/L (ref 3.5–5.1)
Sodium: 135 mmol/L (ref 135–145)
Total Bilirubin: <0.1 mg/dL — ABNORMAL LOW (ref 0.3–1.2)
Total Protein: 6.5 g/dL (ref 6.5–8.1)

## 2018-08-18 LAB — GLUCOSE, CAPILLARY: Glucose-Capillary: 214 mg/dL — ABNORMAL HIGH (ref 70–99)

## 2018-08-18 LAB — ABO/RH: ABO/RH(D): O POS

## 2018-08-18 LAB — SARS CORONAVIRUS 2 BY RT PCR (HOSPITAL ORDER, PERFORMED IN ~~LOC~~ HOSPITAL LAB): SARS Coronavirus 2: NEGATIVE

## 2018-08-18 MED ORDER — LIDOCAINE 5 % EX PTCH
1.0000 | MEDICATED_PATCH | Freq: Every day | CUTANEOUS | Status: DC | PRN
  Filled 2018-08-18: qty 1

## 2018-08-18 MED ORDER — TIOTROPIUM BROMIDE MONOHYDRATE 18 MCG IN CAPS
18.0000 ug | ORAL_CAPSULE | Freq: Every day | RESPIRATORY_TRACT | Status: DC
  Administered 2018-08-18 – 2018-08-25 (×8): 18 ug via RESPIRATORY_TRACT
  Filled 2018-08-18 (×2): qty 5

## 2018-08-18 MED ORDER — VANCOMYCIN HCL 1.25 G IV SOLR
1250.0000 mg | Freq: Once | INTRAVENOUS | Status: AC
  Administered 2018-08-18: 1250 mg via INTRAVENOUS
  Filled 2018-08-18: qty 1250

## 2018-08-18 MED ORDER — MOMETASONE FURO-FORMOTEROL FUM 200-5 MCG/ACT IN AERO
2.0000 | INHALATION_SPRAY | Freq: Two times a day (BID) | RESPIRATORY_TRACT | Status: DC
  Administered 2018-08-18 – 2018-08-25 (×13): 2 via RESPIRATORY_TRACT
  Filled 2018-08-18: qty 8.8

## 2018-08-18 MED ORDER — GABAPENTIN 400 MG PO CAPS
800.0000 mg | ORAL_CAPSULE | Freq: Three times a day (TID) | ORAL | Status: DC
  Administered 2018-08-18 – 2018-08-25 (×19): 800 mg via ORAL
  Filled 2018-08-18 (×19): qty 2

## 2018-08-18 MED ORDER — VANCOMYCIN HCL IN DEXTROSE 1-5 GM/200ML-% IV SOLN
1000.0000 mg | INTRAVENOUS | Status: DC
  Administered 2018-08-19 – 2018-08-23 (×5): 1000 mg via INTRAVENOUS
  Filled 2018-08-18 (×7): qty 200

## 2018-08-18 MED ORDER — INSULIN GLARGINE 100 UNIT/ML ~~LOC~~ SOLN
12.5000 [IU] | Freq: Every day | SUBCUTANEOUS | Status: DC
  Administered 2018-08-18: 13 [IU] via SUBCUTANEOUS
  Filled 2018-08-18 (×3): qty 0.13

## 2018-08-18 MED ORDER — VANCOMYCIN HCL 1.25 G IV SOLR
1250.0000 mg | Freq: Once | INTRAVENOUS | Status: DC
  Filled 2018-08-18: qty 1250

## 2018-08-18 MED ORDER — VITAMIN C 500 MG PO TABS
250.0000 mg | ORAL_TABLET | Freq: Two times a day (BID) | ORAL | Status: DC
  Administered 2018-08-18: 250 mg via ORAL
  Administered 2018-08-19: 500 mg via ORAL
  Administered 2018-08-19 – 2018-08-25 (×11): 250 mg via ORAL
  Filled 2018-08-18 (×13): qty 1

## 2018-08-18 MED ORDER — SODIUM CHLORIDE 0.9 % IV SOLN
INTRAVENOUS | Status: DC
  Administered 2018-08-18 – 2018-08-20 (×3): via INTRAVENOUS

## 2018-08-18 MED ORDER — ALBUTEROL SULFATE (2.5 MG/3ML) 0.083% IN NEBU: 2.5000 mg | INHALATION_SOLUTION | RESPIRATORY_TRACT | Status: DC | PRN

## 2018-08-18 MED ORDER — INSULIN GLARGINE 100 UNIT/ML ~~LOC~~ SOLN: 25.0000 [IU] | Freq: Every day | SUBCUTANEOUS | Status: DC

## 2018-08-18 MED ORDER — NICOTINE 21 MG/24HR TD PT24
21.0000 mg | MEDICATED_PATCH | Freq: Every day | TRANSDERMAL | Status: DC
  Administered 2018-08-18 – 2018-08-25 (×7): 21 mg via TRANSDERMAL
  Filled 2018-08-18 (×9): qty 1

## 2018-08-18 MED ORDER — INSULIN ASPART 100 UNIT/ML ~~LOC~~ SOLN
0.0000 [IU] | Freq: Three times a day (TID) | SUBCUTANEOUS | Status: DC
  Administered 2018-08-19: 5 [IU] via SUBCUTANEOUS
  Administered 2018-08-19: 3 [IU] via SUBCUTANEOUS
  Administered 2018-08-20: 5 [IU] via SUBCUTANEOUS
  Administered 2018-08-20: 1 [IU] via SUBCUTANEOUS
  Administered 2018-08-20: 2 [IU] via SUBCUTANEOUS
  Administered 2018-08-21 (×2): 3 [IU] via SUBCUTANEOUS
  Administered 2018-08-21 – 2018-08-22 (×3): 2 [IU] via SUBCUTANEOUS
  Administered 2018-08-22: 5 [IU] via SUBCUTANEOUS
  Administered 2018-08-23 (×2): 3 [IU] via SUBCUTANEOUS
  Administered 2018-08-24: 5 [IU] via SUBCUTANEOUS
  Administered 2018-08-24 (×2): 3 [IU] via SUBCUTANEOUS
  Administered 2018-08-25: 2 [IU] via SUBCUTANEOUS
  Filled 2018-08-18 (×16): qty 1

## 2018-08-18 MED ORDER — MORPHINE SULFATE (PF) 2 MG/ML IV SOLN
2.0000 mg | INTRAVENOUS | Status: DC | PRN
  Administered 2018-08-18 – 2018-08-19 (×4): 2 mg via INTRAVENOUS
  Filled 2018-08-18 (×4): qty 1

## 2018-08-18 MED ORDER — OXYCODONE HCL 5 MG PO TABS
5.0000 mg | ORAL_TABLET | ORAL | Status: DC | PRN
  Administered 2018-08-18 – 2018-08-20 (×6): 5 mg via ORAL
  Filled 2018-08-18 (×6): qty 1

## 2018-08-18 MED ORDER — INSULIN ASPART 100 UNIT/ML ~~LOC~~ SOLN
0.0000 [IU] | Freq: Every day | SUBCUTANEOUS | Status: DC
  Administered 2018-08-18 – 2018-08-23 (×3): 2 [IU] via SUBCUTANEOUS
  Filled 2018-08-18 (×3): qty 1

## 2018-08-18 MED ORDER — SODIUM CHLORIDE 0.9 % IV SOLN
3.0000 g | Freq: Four times a day (QID) | INTRAVENOUS | Status: DC
  Administered 2018-08-18 – 2018-08-25 (×26): 3 g via INTRAVENOUS
  Filled 2018-08-18 (×3): qty 8
  Filled 2018-08-18 (×4): qty 3
  Filled 2018-08-18: qty 8
  Filled 2018-08-18: qty 3
  Filled 2018-08-18 (×4): qty 8
  Filled 2018-08-18: qty 3
  Filled 2018-08-18: qty 8
  Filled 2018-08-18 (×8): qty 3
  Filled 2018-08-18: qty 8
  Filled 2018-08-18 (×2): qty 3
  Filled 2018-08-18 (×3): qty 8
  Filled 2018-08-18 (×3): qty 3

## 2018-08-18 MED ORDER — BETAMETHASONE SOD PHOS & ACET 6 (3-3) MG/ML IJ SUSP
3.0000 mg | Freq: Once | INTRAMUSCULAR | Status: AC
  Administered 2018-08-18: 3 mg via INTRAMUSCULAR
  Filled 2018-08-18: qty 0.5

## 2018-08-18 NOTE — H&P (View-Only) (Signed)
Reason for Consult: Infection right foot Referring Physician: Ardeen Fillers Katie Guzman is an 48 y.o. female.  HPI: This is a 48 year old diabetic female with a history of ulceration beneath the right great toe joint.  She was seen in a recent hospitalization by Dr. Vickki Muff and then followed up outpatient.  Last week he recommended surgery but she states she was not able to have this done due to some family issues.  Presents to the hospital today for definitive treatment with debridement of the infection and possible amputation.  Past Medical History:  Diagnosis Date  . Asthma   . Cancer ([place])    lung  . Cellulitis 04/15/2015  . COPD (chronic obstructive pulmonary disease) ([place])   . Diabetes mellitus    Poorly controlled with complications  . Emphysema   . Fibromyalgia   . Neuropathy   . Tenosynovitis of foot 04/16/2015    Past Surgical History:  Procedure Laterality Date  . BACK SURGERY    . CESAREAN SECTION    . TONSILLECTOMY    . TUBAL LIGATION      Family History  Problem Relation Age of Onset  . Lung cancer Mother     Social History:  reports that she has been smoking cigarettes. She has a 15.00 pack-year smoking history. She has never used smokeless tobacco. She reports that she does not drink alcohol or use drugs.  Allergies:  Allergies  Allergen Reactions  . Flexeril [Cyclobenzaprine] Hives and Nausea And Vomiting  . Hydrocodone Hives  . Other Hives  . Darvocet [Propoxyphene N-Acetaminophen] Hives and Nausea And Vomiting  . Fish-Derived Products Other (See Comments)    Patient does not like to eat fish. Not allergic but prefers not to.  . Propofol Nausea And Vomiting  . Toradol [Ketorolac Tromethamine] Nausea And Vomiting  . Vicodin [Hydrocodone-Acetaminophen] Hives and Nausea And Vomiting  . Ketorolac Nausea And Vomiting  . Tramadol Nausea And Vomiting    Vomiting only    Medications:  Scheduled: . betamethasone acetate-betamethasone sodium phosphate   3 mg Intramuscular Once  . gabapentin  800 mg Oral TID  . insulin aspart  0-5 Units Subcutaneous QHS  . insulin aspart  0-9 Units Subcutaneous TID WC  . insulin glargine  13 Units Subcutaneous QHS  . mometasone-formoterol  2 puff Inhalation BID  . nicotine  21 mg Transdermal Daily  . tiotropium  18 mcg Inhalation Daily  . ascorbic acid  250 mg Oral BID    Results for orders placed or performed during the hospital encounter of 08/18/18 (from the past 48 hour(s))  ABO/Rh     Status: None   Collection Time: 08/18/18  4:31 PM  Result Value Ref Range   ABO/RH(D)      O POS Performed at [facility], [address] [place], [place] [address]   SARS Coronavirus 2 [facility] order, Performed in [place] hospital lab)     Status: None   Collection Time: 08/18/18  5:39 PM   Specimen: Nasopharyngeal Swab  Result Value Ref Range   SARS Coronavirus 2 NEGATIVE NEGATIVE    Comment: (NOTE) If result is NEGATIVE SARS-CoV-2 target nucleic acids are NOT DETECTED. The SARS-CoV-2 RNA is generally detectable in upper and lower  respiratory specimens during the acute phase of infection. The lowest  concentration of SARS-CoV-2 viral copies this assay can detect is 250  copies / mL. A negative result does not preclude SARS-CoV-2 infection  and should not be used as the sole  basis for treatment or other  patient management decisions.  A negative result may occur with  improper specimen collection / handling, submission of specimen other  than nasopharyngeal swab, presence of viral mutation(s) within the  areas targeted by this assay, and inadequate number of viral copies  (<250 copies / mL). A negative result must be combined with clinical  observations, patient history, and epidemiological information. If result is POSITIVE SARS-CoV-2 target nucleic acids are DETECTED. The SARS-CoV-2 RNA is generally detectable in upper and lower  respiratory specimens dur ing the acute phase of  infection.  Positive  results are indicative of active infection with SARS-CoV-2.  Clinical  correlation with patient history and other diagnostic information is  necessary to determine patient infection status.  Positive results do  not rule out bacterial infection or co-infection with other viruses. If result is PRESUMPTIVE POSTIVE SARS-CoV-2 nucleic acids MAY BE PRESENT.   A presumptive positive result was obtained on the submitted specimen  and confirmed on repeat testing.  While 2019 novel coronavirus  (SARS-CoV-2) nucleic acids may be present in the submitted sample  additional confirmatory testing may be necessary for epidemiological  and / or clinical management purposes  to differentiate between  SARS-CoV-2 and other Sarbecovirus currently known to infect humans.  If clinically indicated additional testing with an alternate test  methodology [phone]) is advised. The SARS-CoV-2 RNA is generally  detectable in upper and lower respiratory sp ecimens during the acute  phase of infection. The expected result is Negative. Fact Sheet for Patients:  StrictlyIdeas.no Fact Sheet for Healthcare Providers: BankingDealers.co.za This test is not yet approved or cleared by the [place] FDA and has been authorized for detection and/or diagnosis of SARS-CoV-2 by FDA under an Emergency Use Authorization (EUA).  This EUA will remain in effect (meaning this test can be used) for the duration of the COVID-19 declaration under Section 564(b)(1) of the Act, 21 U.S.C. section 360bbb-3(b)(1), unless the authorization is terminated or revoked sooner. Performed at [facility], [address] [place], [place] [address]     No results found.  Review of Systems  Constitutional: Negative for chills and fever.  HENT: Negative for congestion and sinus pain.   Eyes: Negative for blurred vision and double vision.  Respiratory: Positive for  shortness of breath.   Cardiovascular: Negative for chest pain and palpitations.  Gastrointestinal: Negative for nausea and vomiting.  Genitourinary: Negative for dysuria and frequency.  Musculoskeletal:       Significant pain with her right great toe joint area.  Skin:       Redness and swelling in the right foot with some drainage from a chronic sore  Neurological:       Denies numbness or paresthesias.  Endo/Heme/Allergies: Does not bruise/bleed easily.  Psychiatric/Behavioral: Negative for depression. The patient is not nervous/anxious.    Blood pressure 119/76, pulse 95, temperature 99 F (37.2 C), temperature source Oral, resp. rate 16, SpO2 95 %. Physical Exam  Cardiovascular:  DP and PT pulses are palpable.  Musculoskeletal:     Comments: Guarded range of motion in the right foot with exquisite pain on any attempted palpation or motion around the right great toe joint area.  Muscle testing is deferred.  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  Neurological:  Protective threshold and sensation grossly intact.  Skin:  Significant erythema and edema in the right forefoot around the great toe joint.  Chronic plantar ulceration beneath the great toe joint  with some abscess forming medially.    Assessment/Plan: Assessment: 1.  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 2.  Diabetes.   Plan: Discussed with the patient the need for I&D of the right foot.  We discussed that if the bone and joint is infected we may need to consider a first ray resection as previously performed on the left foot.  Discussed that the MRI will give [place] more information as to the extent of surgery needed.  MRI has been ordered and we will hopefully evaluate this evening.  Discussed possible risks and complications of the procedure and anesthesia including inability to heal due to her diabetes or persistent infection.  No guarantees could be given.  Questions were  invited and answered.  Consent will be obtained for I&D right foot with possible first ray resection.  N.p.o. after midnight.  Plan for surgery tomorrow morning.  [name] 08/18/2018, 6:55 PM

## 2018-08-18 NOTE — H&P (Signed)
[facility] NAME: Katie Guzman    MR#:  [id number]  DATE OF BIRTH:  April 30, 1970  DATE OF ADMISSION:  08/18/2018  PRIMARY CARE PHYSICIAN: [facility], [facility]   REQUESTING/REFERRING PHYSICIAN: Dr. Vickki Muff  CHIEF COMPLAINT:  No chief complaint on file. Cellulitis of right foot  HISTORY OF PRESENT ILLNESS:  48 y.o. 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  Patient was recently admitted for similar symptoms, she presented with a 27-month history of increasing pain and intermittent drainage from the right plantar surface wound, she noticed increased erythema and edema surrounding the area as well as purulent drainage.  She was treated with IV vancomycin and Unasyn.  X-ray of right foot at that time showed soft tissue edema, she also had lower extremity venous ultrasound showed no evidence of DVT.  During that admission she was evaluated by podiatrist status post debridement.  Cultures showed no growth.  She was discharged on 4 days of oral Augmentin.  She returns due to worsening symptoms.  She is being admitted for possible debridement versus amputation tomorrow per podiatry Dr. Vickki Muff.  PAST MEDICAL HISTORY:   Past Medical History:  Diagnosis Date  . Asthma   . Cancer ([place])    lung  . Cellulitis 04/15/2015  . COPD (chronic obstructive pulmonary disease) ([place])   . Diabetes mellitus    Poorly controlled with complications  . Emphysema   . Fibromyalgia   . Neuropathy   . Tenosynovitis of foot 04/16/2015    PAST SURGICAL HISTORY:   Past Surgical History:  Procedure Laterality Date  . BACK SURGERY    . CESAREAN SECTION    . TONSILLECTOMY    . TUBAL LIGATION      SOCIAL HISTORY:    Social History   Tobacco Use  . Smoking status: Current Some Day Smoker    Packs/day: 0.50    Years: 30.00    Pack years: 15.00    Types: Cigarettes    Last attempt to quit: 10/05/2014    Years since quitting: 3.8  . Smokeless tobacco: Never Used  Substance Use Topics  . Alcohol use: No   FAMILY HISTORY:   Family History  Problem Relation Age of Onset  . Lung cancer Mother     DRUG ALLERGIES:   Allergies  Allergen Reactions  . Flexeril [Cyclobenzaprine] Hives and Nausea And Vomiting  . Hydrocodone Hives  . Other Hives  . Darvocet [Propoxyphene N-Acetaminophen] Hives and Nausea And Vomiting  . Fish-Derived Products Other (See Comments)    Patient does not like to eat fish. Not allergic but prefers not to.  . Propofol Nausea And Vomiting  . Toradol [Ketorolac Tromethamine] Nausea And Vomiting  . Vicodin [Hydrocodone-Acetaminophen] Hives and Nausea And Vomiting  . Ketorolac Nausea And Vomiting  . Tramadol Nausea And Vomiting    Vomiting only   REVIEW OF SYSTEMS:   Review of Systems  Constitutional: Negative for chills, fever, malaise/fatigue and weight loss.  HENT: Negative for congestion, hearing loss and sore throat.   Eyes: Negative for blurred vision and double vision.  Respiratory: Positive for cough, shortness of breath and wheezing.        Chronic respiratory symptoms  Cardiovascular: Negative for chest pain, palpitations, orthopnea and leg swelling.  Gastrointestinal: Negative for abdominal pain, diarrhea, nausea and vomiting.  Genitourinary: Negative for dysuria and urgency.  Musculoskeletal: Positive for joint pain. Negative for myalgias.  Skin: Negative for rash.  Neurological: Negative for dizziness, sensory change, speech change, focal weakness and headaches.  Psychiatric/Behavioral: Negative for depression.    MEDICATIONS AT HOME:   Prior to Admission medications   Medication Sig Start Date End Date Taking? Authorizing Provider  acetaminophen  (TYLENOL) 325 MG tablet Take 650 mg by mouth every 6 (six) hours as needed for mild pain.    [provider]  albuterol (PROVENTIL HFA;VENTOLIN HFA) 108 (90 BASE) MCG/ACT inhaler Inhale 2 puffs into the lungs every 4 (four) hours as needed for wheezing or shortness of breath. For wheeze or shortness of breath Patient taking differently: Inhale 2 puffs into the lungs 2 (two) times a day. For wheeze or shortness of breath 10/23/14   Gouru, Illene Silver, MD  albuterol (PROVENTIL) (2.5 MG/3ML) 0.083% nebulizer solution Take 2.5 mg by nebulization every 4 (four) hours as needed for wheezing or shortness of breath.     [provider]  ascorbic acid (VITAMIN C) 250 MG tablet Take 1 tablet (250 mg total) by mouth 2 (two) times daily. 07/31/18   Vaughan Basta, MD  elvitegravir-cobicistat-emtricitabine-tenofovir (GENVOYA) 150-150-200-10 MG TABS tablet Take 1 tablet by mouth daily with breakfast. Patient not taking: Reported on 01/12/2018 07/31/17   Lavonia Drafts, MD  elvitegravir-cobicistat-emtricitabine-tenofovir (GENVOYA) 150-150-200-10 MG TABS tablet Take 1 tablet by mouth daily with breakfast. Patient not taking: Reported on 01/12/2018 07/31/17   Lavonia Drafts, MD  fluticasone-salmeterol (ADVAIR HFA) [phone] MCG/ACT inhaler Inhale 2 puffs into the lungs 2 (two) times daily. 10/23/14   Nicholes Mango, MD  gabapentin (NEURONTIN) 800 MG tablet Take 1 tablet (800 mg total) by mouth 3 (three) times daily. 04/01/17   Edrick Kins, DPM  ibuprofen (ADVIL) 800 MG tablet Take 800 mg by mouth every 6 (six) hours as needed for fever or mild pain.     [provider]  insulin glargine (LANTUS) 100 UNIT/ML injection Inject 0.25 mLs (25 Units total) into the skin at bedtime. 07/31/18   Vaughan Basta, MD  insulin lispro (HUMALOG) 100 UNIT/ML injection Inject 1-18 Units into the skin 4 (four) times daily -  before meals and at bedtime. Sliding scale  07/11/16 07/28/18  [provider]   lidocaine (LIDODERM) 5 % Place 1 patch onto the skin daily as needed. For pain. Remove & Discard patch within 12 hours or as directed by MD    [provider]  nicotine (NICODERM CQ - DOSED IN MG/24 HOURS) 21 mg/24hr patch Place 1 patch (21 mg total) onto the skin daily. Patient taking differently: Place 21 mg onto the skin daily as needed (smoking cessation).  10/23/14   Gouru, Illene Silver, MD  ondansetron (ZOFRAN ODT) 4 MG disintegrating tablet Take 1 tablet (4 mg total) by mouth every 8 (eight) hours as needed for nausea or vomiting. Patient not taking: Reported on 07/28/2018 12/08/15   Carrie Mew, MD  oxyCODONE-acetaminophen (PERCOCET/ROXICET) 5-325 MG tablet Take 1 tablet by mouth every 6 (six) hours as needed for moderate pain or severe pain. 07/31/18   Vaughan Basta, MD  tiotropium (SPIRIVA) 18 MCG inhalation capsule Place 1 capsule (18 mcg total) into inhaler and inhale daily. 10/23/14   Nicholes Mango, MD      VITAL SIGNS:  Blood pressure 119/76, pulse 95, temperature 99 F (37.2 C), temperature source Oral, resp. rate 16, SpO2 95 %.  PHYSICAL EXAMINATION:   Physical Exam  GENERAL:  48 y.o.-year-old patient lying in the bed with no acute distress.  EYES: Pupils equal, round, reactive to light and accommodation. No scleral icterus. Extraocular muscles intact.  HEENT: Head atraumatic, normocephalic. Oropharynx and nasopharynx clear.  NECK:  Supple, no jugular venous distention. No thyroid enlargement, no tenderness.  LUNGS: Decreased  breath sounds bilaterally, mild wheezing, rales,rhonchi or crepitation. No use of accessory muscles of respiration.  CARDIOVASCULAR: S1, S2 normal. No murmurs, rubs, or gallops.  ABDOMEN: Soft, nontender, nondistended. Bowel sounds present. No organomegaly or mass.  EXTREMITIES: No pedal edema, cyanosis, or clubbing. No rash or lesions. + pedal pulses MUSCULOSKELETAL: Normal bulk, and power was 5+ grip and elbow, knee, and ankle flexion  and extension bilaterally.  NEUROLOGIC:Alert and oriented x 3. CN 2-12 intact. Sensation to light touch and cold stimuli intact bilaterally. Finger to nose nl. Babinski is downgoing. DTR's (biceps, patellar, and achilles) 2+ and symmetric throughout. Gait not tested due to safety concern. PSYCHIATRIC: The patient is alert and oriented x 3.  SKIN: Foot  ulcer.        DATA REVIEWED:  LABORATORY PANEL:   CBC No results for input(s): WBC, HGB, HCT, PLT in the last 168 hours. ------------------------------------------------------------------------------------------------------------------  Chemistries  No results for input(s): NA, K, CL, CO2, GLUCOSE, BUN, CREATININE, CALCIUM, MG, AST, ALT, ALKPHOS, BILITOT in the last 168 hours.  Invalid input(s): GFRCGP ------------------------------------------------------------------------------------------------------------------  Cardiac Enzymes No results for input(s): TROPONINI in the last 168 hours. ------------------------------------------------------------------------------------------------------------------  RADIOLOGY:  No results found.  EKG:  EKG: there are no previous tracings available for comparison.  IMPRESSION AND PLAN:   48 y.o. 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  1.  Cellulitis with superficial ulceration plantar right first MTP joint -recent admission  - MRI foot left with and without contrast pending - Start IV vancomycin and Unasyn - Podiatry consult for debridement tomorrow - N.p.o. after midnight  2. Chronic COPD -without evidence of exacerbation - Bronchodilators (albuterol/ipratropium) standing and PRN  3. Type II DM = poorly controlled -Recent hemoglobin A1c 9.5%  -We will give half dose of Lantus since she will be n.p.o. after midnight for possible surgery tomorrow -Start sliding scale   4.Tobacco abuse - Smoking cessation counseling - NicoDerm patch  5. Diabetic peripheral neuropathy -Continue gabapentin  All the records are reviewed and case discussed with ED provider. Management plans discussed with the patient, family and they are in agreement.  CODE STATUS: Full code  TOTAL TIME TAKING CARE OF THIS PATIENT: 50 minutes.    08/18/2018 at 5:24 PM  Rufina Falco, DNP, FNP-BC [facility] Nurse Practitioner Between 7am to 6pm - Pager [phone]  After 6pm go to [email or web address] - password [email or web address] [facility]  Office  [phone]  CC: Primary care physician; Medicine, [facility]

## 2018-08-18 NOTE — Progress Notes (Signed)
Patient ID: Katie Guzman, female   DOB: [birth year], 48 y.o.   MRN: [id number]  Reviewed the patient's MRI of her right foot.  Radiologist read is not back yet but there is extensive infection in the great toe and the majority of the first metatarsal.  We will plan with aggressive ray resection for tomorrow with amputation of the great toe.

## 2018-08-18 NOTE — Consult Note (Signed)
Reason for Consult: Infection right foot Referring Physician: Ardeen Fillers Katie Guzman is an 48 y.o. female.  HPI: This is a 48 year old diabetic female with a history of ulceration beneath the right great toe joint.  She was seen in a recent hospitalization by Dr. Vickki Muff and then followed up outpatient.  Last week he recommended surgery but she states she was not able to have this done due to some family issues.  Presents to the hospital today for definitive treatment with debridement of the infection and possible amputation.  Past Medical History:  Diagnosis Date  . Asthma   . Cancer ([place])    lung  . Cellulitis 04/15/2015  . COPD (chronic obstructive pulmonary disease) ([place])   . Diabetes mellitus    Poorly controlled with complications  . Emphysema   . Fibromyalgia   . Neuropathy   . Tenosynovitis of foot 04/16/2015    Past Surgical History:  Procedure Laterality Date  . BACK SURGERY    . CESAREAN SECTION    . TONSILLECTOMY    . TUBAL LIGATION      Family History  Problem Relation Age of Onset  . Lung cancer Mother     Social History:  reports that she has been smoking cigarettes. She has a 15.00 pack-year smoking history. She has never used smokeless tobacco. She reports that she does not drink alcohol or use drugs.  Allergies:  Allergies  Allergen Reactions  . Flexeril [Cyclobenzaprine] Hives and Nausea And Vomiting  . Hydrocodone Hives  . Other Hives  . Darvocet [Propoxyphene N-Acetaminophen] Hives and Nausea And Vomiting  . Fish-Derived Products Other (See Comments)    Patient does not like to eat fish. Not allergic but prefers not to.  . Propofol Nausea And Vomiting  . Toradol [Ketorolac Tromethamine] Nausea And Vomiting  . Vicodin [Hydrocodone-Acetaminophen] Hives and Nausea And Vomiting  . Ketorolac Nausea And Vomiting  . Tramadol Nausea And Vomiting    Vomiting only    Medications:  Scheduled: . betamethasone acetate-betamethasone sodium phosphate   3 mg Intramuscular Once  . gabapentin  800 mg Oral TID  . insulin aspart  0-5 Units Subcutaneous QHS  . insulin aspart  0-9 Units Subcutaneous TID WC  . insulin glargine  13 Units Subcutaneous QHS  . mometasone-formoterol  2 puff Inhalation BID  . nicotine  21 mg Transdermal Daily  . tiotropium  18 mcg Inhalation Daily  . ascorbic acid  250 mg Oral BID    Results for orders placed or performed during the hospital encounter of 08/18/18 (from the past 48 hour(s))  ABO/Rh     Status: None   Collection Time: 08/18/18  4:31 PM  Result Value Ref Range   ABO/RH(D)      O POS Performed at [facility], [address] [place], [place] [address]   SARS Coronavirus 2 [facility] order, Performed in [place] hospital lab)     Status: None   Collection Time: 08/18/18  5:39 PM   Specimen: Nasopharyngeal Swab  Result Value Ref Range   SARS Coronavirus 2 NEGATIVE NEGATIVE    Comment: (NOTE) If result is NEGATIVE SARS-CoV-2 target nucleic acids are NOT DETECTED. The SARS-CoV-2 RNA is generally detectable in upper and lower  respiratory specimens during the acute phase of infection. The lowest  concentration of SARS-CoV-2 viral copies this assay can detect is 250  copies / mL. A negative result does not preclude SARS-CoV-2 infection  and should not be used as the sole  basis for treatment or other  patient management decisions.  A negative result may occur with  improper specimen collection / handling, submission of specimen other  than nasopharyngeal swab, presence of viral mutation(s) within the  areas targeted by this assay, and inadequate number of viral copies  (<250 copies / mL). A negative result must be combined with clinical  observations, patient history, and epidemiological information. If result is POSITIVE SARS-CoV-2 target nucleic acids are DETECTED. The SARS-CoV-2 RNA is generally detectable in upper and lower  respiratory specimens dur ing the acute phase of  infection.  Positive  results are indicative of active infection with SARS-CoV-2.  Clinical  correlation with patient history and other diagnostic information is  necessary to determine patient infection status.  Positive results do  not rule out bacterial infection or co-infection with other viruses. If result is PRESUMPTIVE POSTIVE SARS-CoV-2 nucleic acids MAY BE PRESENT.   A presumptive positive result was obtained on the submitted specimen  and confirmed on repeat testing.  While 2019 novel coronavirus  (SARS-CoV-2) nucleic acids may be present in the submitted sample  additional confirmatory testing may be necessary for epidemiological  and / or clinical management purposes  to differentiate between  SARS-CoV-2 and other Sarbecovirus currently known to infect humans.  If clinically indicated additional testing with an alternate test  methodology [phone]) is advised. The SARS-CoV-2 RNA is generally  detectable in upper and lower respiratory sp ecimens during the acute  phase of infection. The expected result is Negative. Fact Sheet for Patients:  StrictlyIdeas.no Fact Sheet for Healthcare Providers: BankingDealers.co.za This test is not yet approved or cleared by the [place] FDA and has been authorized for detection and/or diagnosis of SARS-CoV-2 by FDA under an Emergency Use Authorization (EUA).  This EUA will remain in effect (meaning this test can be used) for the duration of the COVID-19 declaration under Section 564(b)(1) of the Act, 21 U.S.C. section 360bbb-3(b)(1), unless the authorization is terminated or revoked sooner. Performed at [facility], [address] [place], [place] [address]     No results found.  Review of Systems  Constitutional: Negative for chills and fever.  HENT: Negative for congestion and sinus pain.   Eyes: Negative for blurred vision and double vision.  Respiratory: Positive for  shortness of breath.   Cardiovascular: Negative for chest pain and palpitations.  Gastrointestinal: Negative for nausea and vomiting.  Genitourinary: Negative for dysuria and frequency.  Musculoskeletal:       Significant pain with her right great toe joint area.  Skin:       Redness and swelling in the right foot with some drainage from a chronic sore  Neurological:       Denies numbness or paresthesias.  Endo/Heme/Allergies: Does not bruise/bleed easily.  Psychiatric/Behavioral: Negative for depression. The patient is not nervous/anxious.    Blood pressure 119/76, pulse 95, temperature 99 F (37.2 C), temperature source Oral, resp. rate 16, SpO2 95 %. Physical Exam  Cardiovascular:  DP and PT pulses are palpable.  Musculoskeletal:     Comments: Guarded range of motion in the right foot with exquisite pain on any attempted palpation or motion around the right great toe joint area.  Muscle testing is deferred.  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  Neurological:  Protective threshold and sensation grossly intact.  Skin:  Significant erythema and edema in the right forefoot around the great toe joint.  Chronic plantar ulceration beneath the great toe joint  with some abscess forming medially.    Assessment/Plan: Assessment: 1.  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 2.  Diabetes.   Plan: Discussed with the patient the need for I&D of the right foot.  We discussed that if the bone and joint is infected we may need to consider a first ray resection as previously performed on the left foot.  Discussed that the MRI will give [place] more information as to the extent of surgery needed.  MRI has been ordered and we will hopefully evaluate this evening.  Discussed possible risks and complications of the procedure and anesthesia including inability to heal due to her diabetes or persistent infection.  No guarantees could be given.  Questions were  invited and answered.  Consent will be obtained for I&D right foot with possible first ray resection.  N.p.o. after midnight.  Plan for surgery tomorrow morning.  [name] 08/18/2018, 6:55 PM

## 2018-08-18 NOTE — Consult Note (Signed)
Pharmacy Antibiotic Note  Katie Guzman is a 48 y.o. female admitted on 08/18/2018 with Wound infection.  Pharmacy has been consulted for Unasyn and vancomycin dosing.  Plan: Unasyn 3 g q6H.   Will order a vancomycin 1250 mg x 1 loading followed by a vancomycin 1000 mg q24H maintenance dose. AUC 515 Goal AUC 400-550. Plan to order level in 4-5 days.      Temp (24hrs), Avg:99 F (37.2 C), Min:99 F (37.2 C), Max:99 F (37.2 C)  No results for input(s): WBC, CREATININE, LATICACIDVEN, VANCOTROUGH, VANCOPEAK, VANCORANDOM, GENTTROUGH, GENTPEAK, GENTRANDOM, TOBRATROUGH, TOBRAPEAK, TOBRARND, AMIKACINPEAK, AMIKACINTROU, AMIKACIN in the last 168 hours.  Estimated Creatinine Clearance: 56.8 mL/min (by C-G formula based on SCr of 0.86 mg/dL).    Allergies  Allergen Reactions  . Flexeril [Cyclobenzaprine] Hives and Nausea And Vomiting  . Hydrocodone Hives  . Other Hives  . Darvocet [Propoxyphene N-Acetaminophen] Hives and Nausea And Vomiting  . Fish-Derived Products Other (See Comments)    Patient does not like to eat fish. Not allergic but prefers not to.  . Propofol Nausea And Vomiting  . Toradol [Ketorolac Tromethamine] Nausea And Vomiting  . Vicodin [Hydrocodone-Acetaminophen] Hives and Nausea And Vomiting  . Ketorolac Nausea And Vomiting  . Tramadol Nausea And Vomiting    Vomiting only    Antimicrobials this admission: 7/9 Unasyn >>  7/9 Vancomycin >>   Dose adjustments this admission: None  Microbiology results: No new microbiology obtained.   Thank you for allowing pharmacy to be a part of this patient's care.  Oswald Hillock, PharmD, BCPS 08/18/2018 5:09 PM

## 2018-08-19 ENCOUNTER — Inpatient Hospital Stay: Payer: Medicaid Other | Admitting: Anesthesiology

## 2018-08-19 ENCOUNTER — Inpatient Hospital Stay: Admission: RE | Admit: 2018-08-19 | Payer: Medicaid Other | Source: Home / Self Care | Admitting: Podiatry

## 2018-08-19 ENCOUNTER — Encounter: Payer: Self-pay | Admitting: Anesthesiology

## 2018-08-19 ENCOUNTER — Encounter
Admission: AD | Disposition: A | Discharge status: Home / Self Care | Payer: Self-pay | Source: Home / Self Care | Attending: Internal Medicine

## 2018-08-19 ENCOUNTER — Other Ambulatory Visit: Payer: Self-pay

## 2018-08-19 DIAGNOSIS — Z89411 Acquired absence of right great toe: Secondary | ICD-10-CM

## 2018-08-19 DIAGNOSIS — Z89421 Acquired absence of other right toe(s): Secondary | ICD-10-CM

## 2018-08-19 DIAGNOSIS — Z888 Allergy status to other drugs, medicaments and biological substances status: Secondary | ICD-10-CM

## 2018-08-19 DIAGNOSIS — L089 Local infection of the skin and subcutaneous tissue, unspecified: Secondary | ICD-10-CM

## 2018-08-19 DIAGNOSIS — J449 Chronic obstructive pulmonary disease, unspecified: Secondary | ICD-10-CM

## 2018-08-19 DIAGNOSIS — Z89412 Acquired absence of left great toe: Secondary | ICD-10-CM

## 2018-08-19 DIAGNOSIS — Z91013 Allergy to seafood: Secondary | ICD-10-CM

## 2018-08-19 DIAGNOSIS — Z884 Allergy status to anesthetic agent status: Secondary | ICD-10-CM

## 2018-08-19 DIAGNOSIS — M869 Osteomyelitis, unspecified: Secondary | ICD-10-CM

## 2018-08-19 DIAGNOSIS — Z923 Personal history of irradiation: Secondary | ICD-10-CM

## 2018-08-19 DIAGNOSIS — C329 Malignant neoplasm of larynx, unspecified: Secondary | ICD-10-CM

## 2018-08-19 DIAGNOSIS — Z885 Allergy status to narcotic agent status: Secondary | ICD-10-CM

## 2018-08-19 DIAGNOSIS — Z8619 Personal history of other infectious and parasitic diseases: Secondary | ICD-10-CM

## 2018-08-19 DIAGNOSIS — E11628 Type 2 diabetes mellitus with other skin complications: Secondary | ICD-10-CM

## 2018-08-19 DIAGNOSIS — Z89422 Acquired absence of other left toe(s): Secondary | ICD-10-CM

## 2018-08-19 DIAGNOSIS — E1169 Type 2 diabetes mellitus with other specified complication: Principal | ICD-10-CM

## 2018-08-19 DIAGNOSIS — Z720 Tobacco use: Secondary | ICD-10-CM

## 2018-08-19 DIAGNOSIS — Z794 Long term (current) use of insulin: Secondary | ICD-10-CM

## 2018-08-19 DIAGNOSIS — Z8739 Personal history of other diseases of the musculoskeletal system and connective tissue: Secondary | ICD-10-CM

## 2018-08-19 HISTORY — PX: AMPUTATION TOE: SHX6595

## 2018-08-19 HISTORY — PX: WOUND DEBRIDEMENT: SHX247

## 2018-08-19 HISTORY — PX: AMPUTATION: SHX166

## 2018-08-19 LAB — GLUCOSE, CAPILLARY
Glucose-Capillary: 289 mg/dL — ABNORMAL HIGH (ref 70–99)
Glucose-Capillary: 198 mg/dL — ABNORMAL HIGH (ref 70–99)
Glucose-Capillary: 217 mg/dL — ABNORMAL HIGH (ref 70–99)
Glucose-Capillary: 272 mg/dL — ABNORMAL HIGH (ref 70–99)
Glucose-Capillary: 154 mg/dL — ABNORMAL HIGH (ref 70–99)

## 2018-08-19 LAB — SURGICAL PCR SCREEN
MRSA, PCR: NEGATIVE
Staphylococcus aureus: POSITIVE — AB

## 2018-08-19 SURGERY — DEBRIDEMENT, WOUND
Anesthesia: General | Site: First Toe | Laterality: Right

## 2018-08-19 MED ORDER — SENNA 8.6 MG PO TABS
1.0000 | ORAL_TABLET | Freq: Every day | ORAL | Status: DC | PRN
  Administered 2018-08-22 – 2018-08-23 (×2): 8.6 mg via ORAL
  Filled 2018-08-19: qty 1

## 2018-08-19 MED ORDER — POLYETHYLENE GLYCOL 3350 17 G PO PACK
17.0000 g | PACK | Freq: Every day | ORAL | Status: DC
  Administered 2018-08-19 – 2018-08-24 (×6): 17 g via ORAL
  Filled 2018-08-19 (×7): qty 1

## 2018-08-19 MED ORDER — BUPIVACAINE HCL (PF) 0.5 % IJ SOLN
INTRAMUSCULAR | Status: DC | PRN
  Administered 2018-08-19: 20 mL

## 2018-08-19 MED ORDER — MORPHINE SULFATE (PF) 4 MG/ML IV SOLN
4.0000 mg | INTRAVENOUS | Status: DC | PRN
  Administered 2018-08-19 – 2018-08-21 (×22): 4 mg via INTRAVENOUS
  Filled 2018-08-19 (×23): qty 1

## 2018-08-19 MED ORDER — CHLORHEXIDINE GLUCONATE CLOTH 2 % EX PADS
6.0000 | MEDICATED_PAD | Freq: Every day | CUTANEOUS | Status: DC
  Administered 2018-08-20 – 2018-08-23 (×4): 6 via TOPICAL

## 2018-08-19 MED ORDER — PROPOFOL 10 MG/ML IV BOLUS
INTRAVENOUS | Status: AC
  Filled 2018-08-19: qty 20

## 2018-08-19 MED ORDER — MIDAZOLAM HCL 2 MG/2ML IJ SOLN
INTRAMUSCULAR | Status: AC
  Filled 2018-08-19: qty 2

## 2018-08-19 MED ORDER — PROPOFOL 10 MG/ML IV BOLUS: INTRAVENOUS | Status: DC | PRN

## 2018-08-19 MED ORDER — HYDROMORPHONE HCL 1 MG/ML IJ SOLN
INTRAMUSCULAR | Status: AC
  Filled 2018-08-19: qty 1

## 2018-08-19 MED ORDER — HYDROMORPHONE HCL 1 MG/ML IJ SOLN: INTRAMUSCULAR | Status: DC | PRN

## 2018-08-19 MED ORDER — MIDAZOLAM HCL 2 MG/2ML IJ SOLN
INTRAMUSCULAR | Status: DC | PRN
  Administered 2018-08-19: 2 mg via INTRAVENOUS

## 2018-08-19 MED ORDER — FENTANYL CITRATE (PF) 100 MCG/2ML IJ SOLN
INTRAMUSCULAR | Status: DC | PRN
  Administered 2018-08-19 (×2): 50 ug via INTRAVENOUS

## 2018-08-19 MED ORDER — FENTANYL CITRATE (PF) 100 MCG/2ML IJ SOLN
INTRAMUSCULAR | Status: AC
  Administered 2018-08-19: 25 ug via INTRAVENOUS
  Filled 2018-08-19: qty 2

## 2018-08-19 MED ORDER — HYDROMORPHONE HCL 1 MG/ML IJ SOLN
INTRAMUSCULAR | Status: DC | PRN
  Administered 2018-08-19: .6 mg via INTRAVENOUS
  Administered 2018-08-19: .2 mg via INTRAVENOUS

## 2018-08-19 MED ORDER — ONDANSETRON HCL 4 MG/2ML IJ SOLN
INTRAMUSCULAR | Status: DC | PRN
  Administered 2018-08-19: 4 mg via INTRAVENOUS

## 2018-08-19 MED ORDER — SODIUM CHLORIDE 0.9 % IR SOLN
Status: DC | PRN
  Administered 2018-08-19: 09:00:00 500 mL

## 2018-08-19 MED ORDER — FENTANYL CITRATE (PF) 100 MCG/2ML IJ SOLN
25.0000 ug | INTRAMUSCULAR | Status: AC | PRN
  Administered 2018-08-19 (×6): 25 ug via INTRAVENOUS

## 2018-08-19 MED ORDER — PRO-STAT SUGAR FREE PO LIQD
30.0000 mL | Freq: Three times a day (TID) | ORAL | Status: DC
  Administered 2018-08-19 – 2018-08-25 (×12): 30 mL via ORAL

## 2018-08-19 MED ORDER — INSULIN ASPART 100 UNIT/ML ~~LOC~~ SOLN
5.0000 [IU] | Freq: Once | SUBCUTANEOUS | Status: AC
  Administered 2018-08-19: 5 [IU] via SUBCUTANEOUS

## 2018-08-19 MED ORDER — ALBUTEROL SULFATE (2.5 MG/3ML) 0.083% IN NEBU: 2.5000 mg | INHALATION_SOLUTION | RESPIRATORY_TRACT | Status: DC | PRN

## 2018-08-19 MED ORDER — OCUVITE-LUTEIN PO CAPS
1.0000 | ORAL_CAPSULE | Freq: Every day | ORAL | Status: DC
  Administered 2018-08-20 – 2018-08-24 (×5): 1 via ORAL
  Filled 2018-08-19 (×6): qty 1

## 2018-08-19 MED ORDER — ONDANSETRON HCL 4 MG/2ML IJ SOLN: 4.0000 mg | Freq: Once | INTRAMUSCULAR | Status: DC | PRN

## 2018-08-19 MED ORDER — LACTATED RINGERS IV SOLN
INTRAVENOUS | Status: DC | PRN
  Administered 2018-08-19: 08:00:00 via INTRAVENOUS

## 2018-08-19 MED ORDER — PROPOFOL 500 MG/50ML IV EMUL
INTRAVENOUS | Status: DC | PRN
  Administered 2018-08-19: 100 ug/kg/min via INTRAVENOUS

## 2018-08-19 MED ORDER — PROPOFOL 10 MG/ML IV BOLUS
INTRAVENOUS | Status: DC | PRN
  Administered 2018-08-19: 25 mg via INTRAVENOUS
  Administered 2018-08-19: 50 mg via INTRAVENOUS
  Administered 2018-08-19: 25 mg via INTRAVENOUS

## 2018-08-19 MED ORDER — FENTANYL CITRATE (PF) 100 MCG/2ML IJ SOLN
INTRAMUSCULAR | Status: AC
  Filled 2018-08-19: qty 2

## 2018-08-19 MED ORDER — MUPIROCIN 2 % EX OINT
1.0000 "application " | TOPICAL_OINTMENT | Freq: Two times a day (BID) | CUTANEOUS | Status: DC
  Administered 2018-08-19 – 2018-08-23 (×8): 1 via NASAL
  Filled 2018-08-19: qty 22

## 2018-08-19 MED ORDER — PHENYLEPHRINE HCL (PRESSORS) 10 MG/ML IV SOLN
INTRAVENOUS | Status: DC | PRN
  Administered 2018-08-19 (×2): 100 ug via INTRAVENOUS

## 2018-08-19 MED ORDER — INSULIN GLARGINE 100 UNIT/ML ~~LOC~~ SOLN
18.0000 [IU] | Freq: Every day | SUBCUTANEOUS | Status: DC
  Administered 2018-08-19 – 2018-08-23 (×5): 18 [IU] via SUBCUTANEOUS
  Filled 2018-08-19 (×6): qty 0.18

## 2018-08-19 MED ORDER — SENNA 8.6 MG PO TABS
1.0000 | ORAL_TABLET | Freq: Every day | ORAL | Status: DC
  Administered 2018-08-19 – 2018-08-25 (×7): 8.6 mg via ORAL
  Filled 2018-08-19 (×7): qty 1

## 2018-08-19 SURGICAL SUPPLY — 50 items
BANDAGE ELASTIC 4 LF NS (GAUZE/BANDAGES/DRESSINGS) ×4 IMPLANT
BLADE OSCILLATING/SAGITTAL (BLADE) ×2
BLADE SURG 15 STRL LF DISP TIS (BLADE) ×2 IMPLANT
BLADE SURG 15 STRL SS (BLADE) ×2
BLADE SW THK.38XMED LNG THN (BLADE) ×2 IMPLANT
BNDG CONFORM 2 STRL LF (GAUZE/BANDAGES/DRESSINGS) ×4 IMPLANT
BNDG ESMARK 4X12 TAN STRL LF (GAUZE/BANDAGES/DRESSINGS) ×4 IMPLANT
BNDG GAUZE 4.5X4.1 6PLY STRL (MISCELLANEOUS) ×4 IMPLANT
CANISTER SUCT 1200ML W/VALVE (MISCELLANEOUS) ×4 IMPLANT
COVER WAND RF STERILE (DRAPES) ×4 IMPLANT
CUFF TOURN SGL QUICK 12 (TOURNIQUET CUFF) IMPLANT
CUFF TOURN SGL QUICK 18X4 (TOURNIQUET CUFF) ×4 IMPLANT
DRAPE FLUOR MINI C-ARM 54X84 (DRAPES) IMPLANT
DURAPREP 26ML APPLICATOR (WOUND CARE) ×4 IMPLANT
ELECT REM PT RETURN 9FT ADLT (ELECTROSURGICAL) ×4
ELECTRODE REM PT RTRN 9FT ADLT (ELECTROSURGICAL) ×2 IMPLANT
GAUZE SPONGE 4X4 12PLY STRL (GAUZE/BANDAGES/DRESSINGS) ×4 IMPLANT
GAUZE XEROFORM 1X8 LF (GAUZE/BANDAGES/DRESSINGS) ×4 IMPLANT
GLOVE BIO SURGEON STRL SZ7.5 (GLOVE) ×4 IMPLANT
GLOVE INDICATOR 8.0 STRL GRN (GLOVE) ×4 IMPLANT
GOWN STRL REUS W/ TWL LRG LVL3 (GOWN DISPOSABLE) ×4 IMPLANT
GOWN STRL REUS W/TWL LRG LVL3 (GOWN DISPOSABLE) ×4
HANDPIECE VERSAJET DEBRIDEMENT (MISCELLANEOUS) ×4 IMPLANT
KIT STIMULAN RAPID CURE 5CC (Orthopedic Implant) ×4 IMPLANT
KIT TURNOVER KIT A (KITS) ×4 IMPLANT
LABEL OR SOLS (LABEL) IMPLANT
NEEDLE FILTER BLUNT 18X 1/2SAF (NEEDLE) ×2
NEEDLE FILTER BLUNT 18X1 1/2 (NEEDLE) ×2 IMPLANT
NEEDLE HYPO 25X1 1.5 SAFETY (NEEDLE) ×12 IMPLANT
NS IRRIG 500ML POUR BTL (IV SOLUTION) ×4 IMPLANT
PACK EXTREMITY ARMC (MISCELLANEOUS) ×4 IMPLANT
PAD ABD DERMACEA PRESS 5X9 (GAUZE/BANDAGES/DRESSINGS) ×8 IMPLANT
PENCIL ELECTRO HAND CTR (MISCELLANEOUS) IMPLANT
PULSAVAC PLUS IRRIG FAN TIP (DISPOSABLE) ×4
RASP SM TEAR CROSS CUT (RASP) IMPLANT
SOL PREP PVP 2OZ (MISCELLANEOUS) ×4
SOLUTION PREP PVP 2OZ (MISCELLANEOUS) ×2 IMPLANT
STOCKINETTE STRL 4IN 9604848 (GAUZE/BANDAGES/DRESSINGS) ×4 IMPLANT
STOCKINETTE STRL 6IN 960660 (GAUZE/BANDAGES/DRESSINGS) ×4 IMPLANT
SUT ETHILON 3-0 FS-10 30 BLK (SUTURE) ×4
SUT ETHILON 4-0 (SUTURE)
SUT ETHILON 4-0 FS2 18XMFL BLK (SUTURE)
SUT VIC AB 3-0 SH 27 (SUTURE) ×2
SUT VIC AB 3-0 SH 27X BRD (SUTURE) ×2 IMPLANT
SUT VIC AB 4-0 FS2 27 (SUTURE) ×4 IMPLANT
SUTURE EHLN 3-0 FS-10 30 BLK (SUTURE) ×2 IMPLANT
SUTURE ETHLN 4-0 FS2 18XMF BLK (SUTURE) IMPLANT
SYR 10ML LL (SYRINGE) ×8 IMPLANT
SYR 3ML LL SCALE MARK (SYRINGE) ×4 IMPLANT
TIP FAN IRRIG PULSAVAC PLUS (DISPOSABLE) ×2 IMPLANT

## 2018-08-19 NOTE — Progress Notes (Signed)
Patient refuses the bed alarm

## 2018-08-19 NOTE — Plan of Care (Signed)
Patient went to surgery this morning and had the toe amputated.  Dressing to the foot is C/D/I.  Pain control has been an issue since surgery but the patient did actually rest a little this afternoon.  No significant changes.  I spoke to the patient's daughter and gave her an update.

## 2018-08-19 NOTE — Interval H&P Note (Signed)
History and Physical Interval Note:  08/19/2018 7:20 AM  Katie Guzman  has presented today for surgery, with the diagnosis of INFECTION RIGHT FOOT.  The various methods of treatment have been discussed with the patient and family. After consideration of risks, benefits and other options for treatment, the patient has consented to  Procedure(s): DEBRIDEMENT WOUND RIGHT FOOT, DIABETIC (Right) as a surgical intervention.  The patient's history has been reviewed, patient examined, no change in status, stable for surgery.  I have reviewed the patient's chart and labs.  Questions were answered to the patient's satisfaction.     [name]

## 2018-08-19 NOTE — Consult Note (Signed)
Pharmacy Antibiotic Note  Katie Guzman is a 48 y.o. female admitted on 08/18/2018 with Wound infection.  Pharmacy has been consulted for Unasyn and vancomycin dosing. She has a chronic history of ulceration with recent development of abscess on her right foot, MRI shows osteomyelitis. She underwent a right toe amputation this morning by podiatry. This is day #2 of IV antibiotics.  Plan: 1) continue Unasyn 3 g q6H.   2) continue vancomycin 1000 mg q24H  Expected AUC: 461 SCr used: 0.80 (rounded up) F/U SCr in am   Temp (24hrs), Avg:98.3 F (36.8 C), Min:97.8 F (36.6 C), Max:99 F (37.2 C)  Recent Labs  Lab 08/18/18 1631  WBC 11.4*  CREATININE 0.62    Estimated Creatinine Clearance: 61.1 mL/min (by C-G formula based on SCr of 0.62 mg/dL).    Antimicrobials this admission: 7/9 Unasyn >>  7/9 Vancomycin >>   Microbiology results: 7/10 WCx pending 7/10 Bone Cx: pending 7/9 BCx NGTD  Thank you for allowing pharmacy to be a part of this patient's care.  Dallie Piles, PharmD 08/19/2018 10:31 AM

## 2018-08-19 NOTE — Progress Notes (Signed)
Talked to Dr Benjie Karvonen about patient wanting the albuterol switched to inhaler and also something for constipation.  She gave me verbal order for both.

## 2018-08-19 NOTE — Consult Note (Addendum)
NAME: Katie Guzman  DOB: Jul 19, 1970  MRN: [id number]  Date/Time: 08/19/2018 7:31 PM  REQUESTING PROVIDER: Dr. Cleda Mccreedy Subjective:  REASON FOR CONSULT: Right foot infection ? Katie Guzman is a 48 y.o. 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 Patient has had a surface wound for past 3 months with intermittent increased erythema and edema.  As the wound was not getting any better after discharge she saw Dr. Vickki Muff in his office and was admitted for further surgical intervention. In the ED her vitals were blood pressure of 119/76, pulse of 95, temperature of 99 and respiratory rate of 16. Patient underwent right great toe amputation as well as partial ray excision.  I am asked to see the patient for antibiotic recommendation.  Patient  in October 2019 was diagnosed with laryngeal squamous cell carcinoma at [facility].  Unfortunately she could not get chemotherapy as she had developed gangrene of the left toes from poorly controlled diabetes.  She underwent amputation of the second toe on 01/05/2019.  She was admitted to [facility] between 12/27/2017 until 01/11/2018 for right third finger pain and swelling and during that hospitalization was diagnosed with MSSA bacteremia and tenosynovitis which was I/D .during that hospitalization there was concern for drug abuse even though she did not admit for IV or skin popping.  A urine tox screen prior to the  admission on 11/30/2017  was positive for cocaine. TEE could not be done because of the cancer.  The initial plan was to do to weeks of cefazolin and then a dose of dalbavancin prior to discharge on 12 9.  But because of issues with behavior due to did not feel it to be safe for PICC line placement or or pat and patient was discharged AMA prior to completing course of treatment.    She was discharged on Keflex thousand milligrams twice daily through 02/01/2019 after discussion with ID.Marland Kitchen    Past Medical History:  Diagnosis Date  . Asthma   . Cancer ([place])    lung  . Cellulitis 04/15/2015  . COPD (chronic obstructive pulmonary disease) ([place])   . Diabetes mellitus    Poorly controlled with complications  . Emphysema   . Fibromyalgia   . Neuropathy   . Tenosynovitis of foot 04/16/2015    Past Surgical History:  Procedure Laterality Date  . AMPUTATION Right 08/19/2018   Procedure: AMPUTATION RAY;  Surgeon: Sharlotte Alamo, DPM;  Location: [facility] ORS;  Service: Podiatry;  Laterality: Right;  . AMPUTATION TOE Right 08/19/2018   Procedure: AMPUTATION TOE;  Surgeon: Sharlotte Alamo, DPM;  Location: [facility] ORS;  Service: Podiatry;  Laterality: Right;  . BACK SURGERY: Posterior fusion lumbar spine 2003    . CESAREAN SECTION    . TONSILLECTOMY    . TUBAL LIGATION    . WOUND DEBRIDEMENT Right 08/19/2018   Procedure: DEBRIDEMENT WOUND RIGHT FOOT, DIABETIC;  Surgeon: Sharlotte Alamo, DPM;  Location: [facility] ORS;  Service: Podiatry;  Laterality: Right;  Social History   Socioeconomic History  . Marital status: Widowed    Spouse name: Not on file  . Number of children: Not on file  . Years of education: Not on file  . Highest education level: Not on file  Occupational History  . Not on file  Social Needs  . Financial resource strain: Not on file  . Food insecurity    Worry: Not on file    Inability: Not on file  . Transportation needs    Medical: Not on file    Non-medical: Not on file  Tobacco Use  . Smoking  status: Current Some Day Smoker    Packs/day: 0.50    Years: 30.00    Pack years: 15.00    Types: Cigarettes    Last attempt to quit: 10/05/2014    Years since quitting: 3.8  . Smokeless tobacco: Never Used  Substance and Sexual Activity  . Alcohol use: No  . Drug use: No  . Sexual activity: Not on file  Lifestyle  . Physical activity    Days per week: Not on file    Minutes per session: Not on file  . Stress: Not on file  Relationships  . Social Herbalist on phone: Not on file    Gets together: Not on file    Attends religious service: Not on file    Active member of club or organization: Not on file    Attends meetings of clubs or organizations: Not on file    Relationship status: Not on file  . Intimate partner violence    Fear of current or ex partner: Not on file    Emotionally abused: Not on file    Physically abused: Not on file    Forced sexual activity: Not on file  Other Topics Concern  . Not on file  Social History Narrative  . Not on file    Family History  Problem Relation Age of Onset  . Lung cancer Mother    Allergies  Allergen Reactions  . Flexeril [Cyclobenzaprine] Hives and Nausea And Vomiting  . Hydrocodone Hives  . Other Hives  . Darvocet [Propoxyphene N-Acetaminophen] Hives and Nausea And Vomiting  . Fish-Derived Products Other (See Comments)    Patient does not like to eat fish. Not allergic but prefers not to.  . Propofol Nausea And Vomiting  . Toradol [Ketorolac Tromethamine] Nausea And Vomiting  . Vicodin [Hydrocodone-Acetaminophen] Hives and Nausea And Vomiting  . Ketorolac Nausea And Vomiting  . Tramadol Nausea And Vomiting    Vomiting only   ? Current Facility-Administered Medications  Medication Dose Route Frequency Provider Last Rate Last Dose  . 0.9 %  sodium chloride infusion   Intravenous Continuous Sharlotte Alamo, DPM   Stopped at 08/19/18 1857  . albuterol (PROVENTIL) (2.5 MG/3ML) 0.083% nebulizer solution 2.5 mg  2.5  mg Inhalation Q4H PRN Mody, Sital, MD      . Ampicillin-Sulbactam (UNASYN) 3 g in sodium chloride 0.9 % 100 mL IVPB  3 g Intravenous Q6H Sharlotte Alamo, DPM 200 mL/hr at 08/19/18 1743 3 g at 08/19/18 1743  . Chlorhexidine Gluconate Cloth 2 % PADS 6 each  6 each Topical Daily Mody, Sital, MD      . feeding supplement (PRO-STAT SUGAR FREE 64) liquid 30 mL  30 mL Oral TID Bettey Costa, MD   30 mL at 08/19/18 1738  . gabapentin (NEURONTIN) capsule 800 mg  800 mg Oral TID Sharlotte Alamo, DPM  800 mg at 08/19/18 1613  . insulin aspart (novoLOG) injection 0-5 Units  0-5 Units Subcutaneous QHS Sharlotte Alamo, DPM   2 Units at 08/18/18 2311  . insulin aspart (novoLOG) injection 0-9 Units  0-9 Units Subcutaneous TID WC Sharlotte Alamo, DPM   5 Units at 08/19/18 1738  . insulin glargine (LANTUS) injection 18 Units  18 Units Subcutaneous QHS Mody, Sital, MD      . lidocaine (LIDODERM) 5 % 1 patch  1 patch Transdermal Daily PRN Sharlotte Alamo, DPM      . mometasone-formoterol (DULERA) 200-5 MCG/ACT inhaler 2 puff  2 puff Inhalation BID Sharlotte Alamo, DPM   2 puff at 08/19/18 1031  . morphine 4 MG/ML injection 4 mg  4 mg Intravenous Q2H PRN Sharlotte Alamo, DPM   4 mg at 08/19/18 1853  . [START ON 08/20/2018] multivitamin-lutein (OCUVITE-LUTEIN) capsule 1 capsule  1 capsule Oral Daily Mody, Sital, MD      . mupirocin ointment (BACTROBAN) 2 % 1 application  1 application Nasal BID Bettey Costa, MD   1 application at 76/81/15 1226  . nicotine (NICODERM CQ - dosed in mg/24 hours) patch 21 mg  21 mg Transdermal Daily Sharlotte Alamo, DPM   21 mg at 08/18/18 2309  . oxyCODONE (Oxy IR/ROXICODONE) immediate release tablet 5 mg  5 mg Oral Q4H PRN Sharlotte Alamo, DPM   5 mg at 08/19/18 1737  . polyethylene glycol (MIRALAX / GLYCOLAX) packet 17 g  17 g Oral Daily Mody, Sital, MD   17 g at 08/19/18 1613  . senna (SENOKOT) tablet 8.6 mg  1 tablet Oral Daily Mody, Sital, MD   8.6 mg at 08/19/18 1613  . senna (SENOKOT) tablet 8.6 mg  1 tablet Oral Daily PRN  Bettey Costa, MD      . tiotropium (SPIRIVA) inhalation capsule ([facility] use ONLY) 18 mcg  18 mcg Inhalation Daily Sharlotte Alamo, DPM   18 mcg at 08/19/18 1032  . vancomycin (VANCOCIN) IVPB 1000 mg/200 mL premix  1,000 mg Intravenous Q24H Sharlotte Alamo, DPM 200 mL/hr at 08/19/18 1857 1,000 mg at 08/19/18 1857  . vitamin C (ASCORBIC ACID) tablet 250 mg  250 mg Oral BID Sharlotte Alamo, DPM   250 mg at 08/19/18 1033     Abtx:  Anti-infectives (From admission, onward)   Start     Dose/Rate Route Frequency Ordered Stop   08/19/18 1800  vancomycin (VANCOCIN) IVPB 1000 mg/200 mL premix     1,000 mg 200 mL/hr over 60 Minutes Intravenous Every 24 hours 08/18/18 1717     08/18/18 2030  Vancomycin (VANCOCIN) 1,250 mg in sodium chloride 0.9 % 250 mL IVPB     1,250 mg 166.7 mL/hr over 90 Minutes Intravenous  Once 08/18/18 2019 08/19/18 0037   08/18/18 1800  Ampicillin-Sulbactam (UNASYN) 3 g in sodium chloride 0.9 % 100 mL IVPB     3 g 200 mL/hr over 30 Minutes Intravenous Every 6 hours 08/18/18 1717     08/18/18 1800  vancomycin (VANCOCIN) 1,250 mg in sodium chloride 0.9 % 250 mL IVPB  Status:  Discontinued     1,250 mg 166.7 mL/hr over 90 Minutes Intravenous  Once 08/18/18 1717 08/18/18 2019      REVIEW OF SYSTEMS:  Const: negative fever, negative chills, negative weight loss Eyes: negative diplopia or visual changes, negative eye pain ENT: Pain in her throat, hoarse voice Resp: negative cough, hemoptysis, dyspnea Cards: negative for chest pain, palpitations, lower extremity edema GU: negative for frequency,  dysuria and hematuria GI: Negative for abdominal pain, diarrhea, bleeding, constipation Skin: negative for rash and pruritus Heme: negative for easy bruising and gum/nose bleeding MS: negative for myalgias, arthralgias, back pain and muscle weakness Neurolo:negative for headaches, dizziness, vertigo, memory problems  Psych: negative for feelings of anxiety, depression  Endocrine: negative for  thyroid, diabetes Allergy/Immunology-has multiple allergies reported to pain medications as above but none to antibiotics. Objective:  VITALS:  BP (!) 144/80 (BP Location: Left Arm)   Pulse 90   Temp 97.8 F (36.6 C) (Oral)   Resp 16   Ht 5\' 2"  (1.575 m)   Wt 49.9 kg   SpO2 92%   BMI 20.12 kg/m  PHYSICAL EXAM:  General: Alert, cooperative, no distress, appears stated age.  Hoarse voice Head: Normocephalic, without obvious abnormality, atraumatic. Eyes: Conjunctivae clear, anicteric sclerae. Pupils are equal ENT Nares normal. No drainage or sinus tenderness. Lips, mucosa, and tongue normal. No Thrush Neck: Supple, Chronic radiation changes to the skin around the neck Back: No CVA tenderness. Lumbar scar Lungs: Bilateral air entry Heart: Regular rate and rhythm, no murmur, rub or gallop. Abdomen: Soft, non-tender,not distended. Bowel sounds normal. No masses Extremities: Right foot surgical dressing not removed on reviewing the pictures before surgery    Left foot first and second toe amputated Skin: No obvious rash Neurologic: Grossly non-focal Pertinent Labs Lab Results CBC    Component Value Date/Time   WBC 11.4 (H) 08/18/2018 1631   RBC 4.40 08/18/2018 1631   HGB 12.2 08/18/2018 1631   HGB 14.9 09/05/2013 0213   HCT 39.2 08/18/2018 1631   HCT 45.5 09/05/2013 0213   PLT 480 (H) 08/18/2018 1631   PLT 298 09/05/2013 0213   MCV 89.1 08/18/2018 1631   MCV 91 09/05/2013 0213   MCH 27.7 08/18/2018 1631   MCHC 31.1 08/18/2018 1631   RDW 14.2 08/18/2018 1631   RDW 13.5 09/05/2013 0213   LYMPHSABS 0.6 (L) 08/18/2018 1631   LYMPHSABS 2.1 09/05/2013 0213   MONOABS 0.8 08/18/2018 1631   MONOABS 0.7 09/05/2013 0213   EOSABS 0.2 08/18/2018 1631   EOSABS 0.3 09/05/2013 0213   BASOSABS 0.1 08/18/2018 1631   BASOSABS 0.3 (H) 09/05/2013 0213    CMP Latest Ref Rng & Units 08/18/2018 08/06/2018 07/31/2018  Glucose 70 - 99 mg/dL 185(H) 240(H) -  BUN 6 - 20 mg/dL 16 26(H) -   Creatinine 0.44 - 1.00 mg/dL 0.62 0.86 0.69  Sodium 135 - 145 mmol/L 135 134(L) -  Potassium 3.5 - 5.1 mmol/L 4.0 4.1 -  Chloride 98 - 111 mmol/L 98 95(L) -  CO2 22 - 32 mmol/L 30 28 -  Calcium 8.9 - 10.3 mg/dL 8.6(L) 9.5 -  Total Protein 6.5 - 8.1 g/dL 6.5 - -  Total Bilirubin 0.3 - 1.2 mg/dL <0.1(L) - -  Alkaline Phos 38 - 126 U/L 100 - -  AST 15 - 41 U/L 11(L) - -  ALT 0 - 44 U/L 7 - -      Microbiology: Recent Results (from the past 240 hour(s))  Surgical PCR screen     Status: Abnormal   Collection Time: 08/18/18  6:32 AM   Specimen: Nasal Mucosa; Nasal Swab  Result Value Ref Range Status   MRSA, PCR NEGATIVE NEGATIVE Final   Staphylococcus aureus POSITIVE (A) NEGATIVE Final    Comment: RCRV ERICA HOPKINS AT 6578 ON 08/19/2018 KLM (NOTE) The Xpert SA Assay (FDA approved for NASAL specimens in patients 46 years of age and  older), is one component of a comprehensive surveillance program. It is not intended to diagnose infection nor to guide or monitor treatment. Performed at [facility], [address] [place], [place] [address]   SARS Coronavirus 2 [facility] order, Performed in [place] hospital lab)     Status: None   Collection Time: 08/18/18  5:39 PM   Specimen: Nasopharyngeal Swab  Result Value Ref Range Status   SARS Coronavirus 2 NEGATIVE NEGATIVE Final    Comment: (NOTE) If result is NEGATIVE SARS-CoV-2 target nucleic acids are NOT DETECTED. The SARS-CoV-2 RNA is generally detectable in upper and lower  respiratory specimens during the acute phase of infection. The lowest  concentration of SARS-CoV-2 viral copies this assay can detect is 250  copies / mL. A negative result does not preclude SARS-CoV-2 infection  and should not be used as the sole basis for treatment or other  patient management decisions.  A negative result may occur with  improper specimen collection / handling, submission of specimen other  than nasopharyngeal swab, presence  of viral mutation(s) within the  areas targeted by this assay, and inadequate number of viral copies  (<250 copies / mL). A negative result must be combined with clinical  observations, patient history, and epidemiological information. If result is POSITIVE SARS-CoV-2 target nucleic acids are DETECTED. The SARS-CoV-2 RNA is generally detectable in upper and lower  respiratory specimens dur ing the acute phase of infection.  Positive  results are indicative of active infection with SARS-CoV-2.  Clinical  correlation with patient history and other diagnostic information is  necessary to determine patient infection status.  Positive results do  not rule out bacterial infection or co-infection with other viruses. If result is PRESUMPTIVE POSTIVE SARS-CoV-2 nucleic acids MAY BE PRESENT.   A presumptive positive result was obtained on the submitted specimen  and confirmed on repeat testing.  While 2019 novel coronavirus  (SARS-CoV-2) nucleic acids may be present in the submitted sample  additional confirmatory testing may be necessary for epidemiological  and / or clinical management purposes  to differentiate between  SARS-CoV-2 and other Sarbecovirus currently known to infect humans.  If clinically indicated additional testing with an alternate test  methodology [phone]) is advised. The SARS-CoV-2 RNA is generally  detectable in upper and lower respiratory sp ecimens during the acute  phase of infection. The expected result is Negative. Fact Sheet for Patients:  StrictlyIdeas.no Fact Sheet for Healthcare Providers: BankingDealers.co.za This test is not yet approved or cleared by the [place] FDA and has been authorized for detection and/or diagnosis of SARS-CoV-2 by FDA under an Emergency Use Authorization (EUA).  This EUA will remain in effect (meaning this test can be used) for the duration of the COVID-19 declaration under Section  564(b)(1) of the Act, 21 U.S.C. section 360bbb-3(b)(1), unless the authorization is terminated or revoked sooner. Performed at [facility], [address] [place], [place] [address]   CULTURE, BLOOD (ROUTINE X 2) w Reflex to ID Panel     Status: None (Preliminary result)   Collection Time: 08/18/18  6:59 PM   Specimen: BLOOD  Result Value Ref Range Status   Specimen Description BLOOD RIGHT ANTECUBITAL  Final   Special Requests   Final    BOTTLES DRAWN AEROBIC AND ANAEROBIC Blood Culture adequate volume   Culture   Final    NO GROWTH < 12 HOURS Performed at [facility], [address], [place], [place] [address]    Report  Status PENDING  Incomplete  CULTURE, BLOOD (ROUTINE X 2) w Reflex to ID Panel     Status: None (Preliminary result)   Collection Time: 08/18/18  6:59 PM   Specimen: BLOOD  Result Value Ref Range Status   Specimen Description BLOOD LEFT ANTECUBITAL  Final   Special Requests   Final    BOTTLES DRAWN AEROBIC AND ANAEROBIC Blood Culture adequate volume   Culture   Final    NO GROWTH < 12 HOURS Performed at [facility], [address] [place], [place] [address]    Report Status PENDING  Incomplete  Aerobic/Anaerobic Culture (surgical/deep wound)     Status: None (Preliminary result)   Collection Time: 08/19/18  8:45 AM   Specimen: Abscess  Result Value Ref Range Status   Specimen Description   Final    BONE Performed at [facility], [address], [place], [place] [address]    Special Requests   Final    RIGHT GREAT TOE Performed at [facility], [address] [place], [place] [address]    Gram Stain   Final    RARE WBC PRESENT, PREDOMINANTLY MONONUCLEAR NO ORGANISMS SEEN Performed at [facility], [place] [address] [place], [place] [address]    Culture PENDING  Incomplete   Report Status PENDING  Incomplete  Aerobic/Anaerobic Culture (surgical/deep wound)     Status: None (Preliminary result)    Collection Time: 08/19/18  8:45 AM   Specimen: [facility] Bone biopsy; Tissue  Result Value Ref Range Status   Specimen Description   Final    ABSCESS Performed at [facility], [address] [place], [place] [address]    Special Requests   Final    RIGHT GREAT TOE Performed at [facility], [place], [place] [address]    Gram Stain   Final    RARE WBC PRESENT,BOTH PMN AND MONONUCLEAR NO ORGANISMS SEEN Performed at [facility], [place] [address] [place],  [address]    Culture PENDING  Incomplete   Report Status PENDING  Incomplete    IMAGING RESULTS: I have personally reviewed the films ? Impression/Recommendation ? ?Diabetic foot infection of the right great toe.  MRI showed osteomyelitis involving the shaft and head of the first metatarsal and the base of the proximal phalanx of the great toe.  She was started on IV vancomycin and Unasyn.  She underwent amputation of the right great toe with partial first ray resection.  Cultures have been sent.  Pathology has been sent. ?In the previous admission the culture of the wound was positive only for group B streptococcus. Continue the current antibiotics until we have the results of the culture Patient has history of cocaine use and recently when she was in [facility] November 2019 for MSSA bacteremia and finger infection there was a question of manipulation and  she was  given IV antibiotics for 2 weeks during hospitalization and discharged on p.o. Keflex. So there is a concern for sending patient home with a PICC line.  History of MSSA bacteremia. History of gangrene left toe status post amputation November 2019   ___Diabetes mellitus poorly controlled hemoglobin A1c from 07/29/2018 was 9.5.  She is currently on insulin   Squamous cell carcinoma of the larynx status post radiation.  There is a concern for residual tumor but she could not get a PET scan because of poorly controlled diabetes.   She was not a candidate for chemotherapy because  of the toe infections.  Current smoker   ________________________________________________ Discussed with patient, We will discuss with Dr. Cleda Mccreedy regarding oral antibiotics on discharge.

## 2018-08-19 NOTE — Progress Notes (Signed)
Inpatient Diabetes Program Recommendations  AACE/ADA: New Consensus Statement on Inpatient Glycemic Control   Target Ranges:  Prepandial:   less than 140 mg/dL      Peak postprandial:   less than 180 mg/dL (1-2 hours)      Critically ill patients:  140 - 180 mg/dL   Results for NYLENE, INLOW (MRN [id number]) as of 08/19/2018 10:15  Ref. Range 08/18/2018 21:55 08/19/2018 07:19 08/19/2018 09:22  Glucose-Capillary Latest Ref Range: 70 - 99 mg/dL 214 (H) 289 (H) 198 (H)  Results for BAILIE, CHRISTENBURY (MRN [id number]) as of 08/19/2018 10:15  Ref. Range 07/29/2018 04:04  Hemoglobin A1C Latest Ref Range: 4.8 - 5.6 % 9.5 (H)   Review of Glycemic Control  Diabetes history: DM2 Outpatient Diabetes medications: Lantus 25 units QHS, Humalog 1-18 units Q4H Current orders for Inpatient glycemic control: Lantus 13 units QHS, Novolog 0-9 units TID with meals, Novolog 0-5 units QHS  Inpatient Diabetes Program Recommendations:   Insulin - Basal: Please consider increasing Lantus to 18 units QHS.  Insulin - Meal Coverage: If post prandial glucose is consistently elevated greater than 180 mg/dl, please consider ordering Novolog 3 units TID with meals for meal coverage if patient eats at least 50% of meals.  Thanks, Barnie Alderman, RN, MSN, CDE Diabetes Coordinator Inpatient Diabetes Program [phone] (Team Pager from 8am to 5pm)

## 2018-08-19 NOTE — Progress Notes (Addendum)
Initial Nutrition Assessment  DOCUMENTATION CODES:   Not applicable  INTERVENTION:   Prostat liquid protein PO 30 ml BID with meals, each supplement provides 100 kcal, 15 grams protein.  Ocuvite daily for wound healing (provides zinc, vitamin A, vitamin C, Vitamin E, copper, and selenium)  Vitamin C 250mg  po BID  NUTRITION DIAGNOSIS:   Increased nutrient needs related to wound healing as evidenced by increased estimated needs.  GOAL:   Patient will meet greater than or equal to 90% of their needs  MONITOR:   PO intake, Supplement acceptance, Labs, Weight trends, Skin, I & O's  REASON FOR ASSESSMENT:   Malnutrition Screening Tool    ASSESSMENT:   48 year old female with a history of diabetes, COPD, throat cancer s/p chemo/XRT, osteomyelitis status post left great and second toe amputation and ongoing tobacco abuse who was directly admitted from podiatry due to concern of osteomyelitis of first right foot metatarsal.  RD working remotely.  RD familiar with this patient from recent previous admit ~ 3 weeks ago. Pt reports that for the past year, she has been undergoing chemo and radiation and she has been unable to eat much. Per chart, pt has lost 42lbs(27%) over the past year; this is significant weight loss. Pt does not like supplements, she reports they make her sick. RD discussed the importance of adequate nutrition needed for wound healing as well as to preserve lean muscle mass. Pt is currently eating 100% of meals in hospital. Pt is willing to try Prostat supplement. RD will also add vitamins to support wound healing. RD discussed with patient how elevated blood sugars can affect healing. Briefly discussed diabetic diet and provided handouts with supporting information.   Pt at high risk for malnutrition but unable to diagnose at this time as NFPE cannot be performed.   Medications reviewed and include: insulin, nicotine, vitamin C, unasyn, vancomycin     Labs reviewed:  wbc- 11.4(H) cbgs- 214, 289, 198 x 24 hrs AIC 9.5(H)- 6/19  Unable to complete Nutrition-Focused physical exam at this time.   Diet Order:   Diet Order            Diet Carb Modified Fluid consistency: Thin; Room service appropriate? Yes  Diet effective now             EDUCATION NEEDS:   Education needs have been addressed  Skin:  Skin Assessment: Reviewed RN Assessment(incision R foot)  Last BM:  7/7  Height:   Ht Readings from Last 1 Encounters:  08/06/18 5' (1.524 m)    Weight:   Wt Readings from Last 1 Encounters:  08/06/18 52.2 kg    Ideal Body Weight:  45.4 kg  BMI:  There is no height or weight on file to calculate BMI.  Estimated Nutritional Needs:   Kcal:  1400-1600kcal/day  Protein:  70-80g/day  Fluid:  >1.4L/day  Koleen Distance MS, RD, LDN Pager #- [phone] Office#- [phone] After Hours Pager: [phone]

## 2018-08-19 NOTE — Op Note (Signed)
Date of operation: 08/19/2018.  Surgeon: [name] D.P.M.  Preoperative diagnosis: Abscess with osteomyelitis right first ray.  Postoperative diagnosis: Same.  Procedure: Amputation right great toe with partial first ray resection.  Anesthesia: Local MAC.  Hemostasis: Pneumatic tourniquet right ankle 250 mmHg.  Estimated blood loss: Less than 5 cc.  Cultures: Bone culture right first metatarsal.  Also culture abscess first metatarsal phalangeal joint area.  Pathology: Right first ray.  Implants: Stimulan rapid cure antibiotic beads impregnated with vancomycin.  Complications: None apparent.  Operative indications: This is a 48 year old female with a chronic history of ulceration with recent development of abscess on her right foot.  Admitted yesterday and MRI showed osteomyelitis in the great toe and first metatarsal.  Decision was made for amputation with first ray resection.  Operative procedure: Patient was taken to the operating room and placed on the table in the supine position.  Following satisfactory sedation the right foot was anesthetized with 20 cc total of 0.5% Marcaine plain around the first metatarsal and midfoot area.  A pneumatic tourniquet was applied at the level of the right ankle and the foot was prepped and draped in the usual sterile fashion.  The foot was exsanguinated and the tourniquet inflated to 250 mmHg.     Attention was then directed to the distal aspect of the right foot where a racquet type incision was made coursing from the first interspace dorsal and plantar around the base of the great toe and then extended medially along the first metatarsal area.  The incision was deepened via sharp and blunt dissection down to the level of the joint where some purulence was encountered and a culture was taken for sensitivities.  Dissection was then carried away from the superficial tissues back to the proximal level of the first metatarsal where the metatarsal was  incised using a sagittal saw and the entire first ray was resected from the surrounding soft tissue and delivered from the wound.  A bone culture was then taken from the proximal aspect of the first metatarsal.  All wound edges were then debrided with a versa jet debrider on a setting of 3 and then thoroughly irrigated with 3 L of saline under pulsed irrigation.  Stimulan rapid cure antibiotic beads were then placed in to the cut end of the first metatarsal and the remainder of the wound and the incision was then closed using 3-0 nylon vertical mattress and simple interrupted sutures.  Xeroform 4 x 4's ABDs and Kerlix applied to the right foot.  The tourniquet was released.  A second Kerlix and Ace wrap were then applied to the right foot.  Patient was awakened and transported to the PACU with vital signs stable and in good condition.

## 2018-08-19 NOTE — Progress Notes (Signed)
[facility] at [place] NAME: Katie Guzman    MR#:  [id number]  DATE OF BIRTH:  1970/12/10  SUBJECTIVE:   Patient seen in PACU  Pain controlled  REVIEW OF SYSTEMS:    Review of Systems  Constitutional: Negative for fever, chills weight loss HENT: Negative for ear pain, nosebleeds, congestion, facial swelling, rhinorrhea, neck pain, neck stiffness and ear discharge.   Respiratory: Negative for cough, shortness of breath, wheezing  Cardiovascular: Negative for chest pain, palpitations and leg swelling.  Gastrointestinal: Negative for heartburn, abdominal pain, vomiting, diarrhea or consitpation Genitourinary: Negative for dysuria, urgency, frequency, hematuria Musculoskeletal: Negative for back pain or joint pain Neurological: Negative for dizziness, seizures, syncope, focal weakness,  numbness and headaches.  Hematological: Does not bruise/bleed easily.  Psychiatric/Behavioral: Negative for hallucinations, confusion, dysphoric mood    Tolerating Diet: yes      DRUG ALLERGIES:   Allergies  Allergen Reactions  . Flexeril [Cyclobenzaprine] Hives and Nausea And Vomiting  . Hydrocodone Hives  . Other Hives  . Darvocet [Propoxyphene N-Acetaminophen] Hives and Nausea And Vomiting  . Fish-Derived Products Other (See Comments)    Patient does not like to eat fish. Not allergic but prefers not to.  . Propofol Nausea And Vomiting  . Toradol [Ketorolac Tromethamine] Nausea And Vomiting  . Vicodin [Hydrocodone-Acetaminophen] Hives and Nausea And Vomiting  . Ketorolac Nausea And Vomiting  . Tramadol Nausea And Vomiting    Vomiting only    VITALS:  Blood pressure (!) 144/80, pulse 88, temperature 97.9 F (36.6 C), temperature source Oral, resp. rate 18, SpO2 94 %.  PHYSICAL EXAMINATION:  Constitutional: Appears well-developed and well-nourished. No distress. HENT: Normocephalic. Marland Kitchen Oropharynx is clear and moist.  Eyes: Conjunctivae  and EOM are normal. PERRLA, no scleral icterus.  Neck: Normal ROM. Neck supple. No JVD. No tracheal deviation. CVS: RRR, S1/S2 +, no murmurs, no gallops, no carotid bruit.  Pulmonary: Effort and breath sounds normal, no stridor, rhonchi, wheezes, rales.  Abdominal: Soft. BS +,  no distension, tenderness, rebound or guarding.  Musculoskeletal: Normal range of motion. No edema and no tenderness.  Neuro: Alert. CN 2-12 grossly intact. No focal deficits. Skin: Right foot is dressed.  Left foot with first 2 toes amputated Psychiatric: Normal mood and affect.      LABORATORY PANEL:   CBC Recent Labs  Lab 08/18/18 1631  WBC 11.4*  HGB 12.2  HCT 39.2  PLT 480*   ------------------------------------------------------------------------------------------------------------------  Chemistries  Recent Labs  Lab 08/18/18 1631  NA 135  K 4.0  CL 98  CO2 30  GLUCOSE 185*  BUN 16  CREATININE 0.62  CALCIUM 8.6*  AST 11*  ALT 7  ALKPHOS 100  BILITOT <0.1*   ------------------------------------------------------------------------------------------------------------------  Cardiac Enzymes No results for input(s): TROPONINI in the last 168 hours. ------------------------------------------------------------------------------------------------------------------  RADIOLOGY:  Mr Foot Right Wo Contrast  Result Date: 08/19/2018 CLINICAL DATA:  Right foot pain and swelling. Plantar foot ulceration. EXAM: MRI OF THE RIGHT FOREFOOT WITHOUT CONTRAST TECHNIQUE: Multiplanar, multisequence MR imaging of the right forefoot was performed. No intravenous contrast was administered. COMPARISON:  Radiographs dated 08/06/2018 FINDINGS: Bones/Joint/Cartilage There is osteomyelitis involving the shaft and head of the first metatarsal and the base of the proximal phalanx of the great toe. There is osteomyelitis of the sesamoids. There is an abscess which extends around the first metatarsal and into the first  MTP joint. The other bones of the forefoot demonstrate no evidence of osteomyelitis. Cyst in  the distal shaft of the fifth metatarsal. Muscles and Tendons Fluid is within the flexor tendon of the great toe consistent with infectious tenosynovitis. There is abnormal edema in the muscles at the medial aspect of the forefoot consistent with myositis. Soft tissues Abscess surrounding the first metatarsal and the proximal phalanx of the great toe with adjacent myositis and cellulitis. IMPRESSION: 1. Osteomyelitis involving almost the entire first metatarsal as well as the base of the proximal phalanx of the great toe. 2. Extensive soft tissue abscess in the same area with adjacent myositis and cellulitis. Electronically Signed   By: Lorriane Shire M.D.   On: 08/19/2018 05:26     ASSESSMENT AND PLAN:   48 year old female with a history of diabetes and osteomyelitis status post left great and second toe amputation and ongoing tobacco abuse who was directly admitted from podiatry due to concern of osteomyelitis of first right foot metatarsal.   1.  Osteomyelitis involving entire first metatarsal and base of proximal phalanx of great toe: Patient is postoperative day #0 ray resection with amputation of great toe  Continue Unasyn/vancomycin for now Podiatry consult appreciated  2.  Diabetes: Continue with ADA diet, Lantus and SSI   Diabetes nurse consultation appreciated  3. Tobacco dependence: Patient is encouraged to quit smoking and willing to attempt to quit was assessed. Patient highly motivated.Counseling was provided for 4 minutes.  Nicotine patch placed  4.  COPD without signs of exacerbation: Continue inhalers  Management plans discussed with the patient and she is in agreement.  CODE STATUS: full  TOTAL TIME TAKING CARE OF THIS PATIENT: 30 minutes.  D/w dr cline at bedside   POSSIBLE D/C 1-2 days, DEPENDING ON CLINICAL CONDITION.   Bettey Costa M.D on 08/19/2018 at 10:48  AM  Between 7am to 6pm - Pager - [phone] After 6pm go to [email or web address] - password [email or web address] [facility]  Office  [phone]  CC: Primary care physician; Medicine, [facility]  Note: This dictation was prepared with Dragon dictation along with smaller phrase technology. Any transcriptional errors that result from this process are unintentional.

## 2018-08-19 NOTE — Anesthesia Preprocedure Evaluation (Addendum)
Anesthesia Evaluation  Patient identified by MRN, date of birth, ID band Patient awake    Reviewed: Allergy & Precautions, NPO status , Patient's Chart, lab work & pertinent test results  History of Anesthesia Complications Negative for: history of anesthetic complications  Airway Mallampati: III       Dental   Pulmonary asthma , COPD,  COPD inhaler, Current Smoker,           Cardiovascular (-) hypertension(-) Past MI and (-) CHF (-) dysrhythmias (-) Valvular Problems/Murmurs     Neuro/Psych neg Seizures Depression    GI/Hepatic Neg liver ROS, neg GERD  ,  Endo/Other  diabetes, Poorly Controlled, Type 2, Oral Hypoglycemic Agents  Renal/GU negative Renal ROS     Musculoskeletal   Abdominal   Peds  Hematology   Anesthesia Other Findings   Reproductive/Obstetrics                            Anesthesia Physical Anesthesia Plan  ASA: III  Anesthesia Plan: General   Post-op Pain Management:    Induction: Intravenous  PONV Risk Score and Plan: 2 and Propofol infusion and TIVA  Airway Management Planned: Nasal Cannula  Additional Equipment:   Intra-op Plan:   Post-operative Plan:   Informed Consent: I have reviewed the patients History and Physical, chart, labs and discussed the procedure including the risks, benefits and alternatives for the proposed anesthesia with the patient or authorized representative who has indicated his/her understanding and acceptance.       Plan Discussed with:   Anesthesia Plan Comments:         Anesthesia Quick Evaluation

## 2018-08-19 NOTE — Anesthesia Postprocedure Evaluation (Signed)
Anesthesia Post Note  Patient: Katie Guzman  Procedure(s) Performed: DEBRIDEMENT WOUND RIGHT FOOT, DIABETIC (Right ) AMPUTATION TOE (Right First Toe) AMPUTATION RAY (Right First Toe)  Patient location during evaluation: PACU Anesthesia Type: General Level of consciousness: awake and alert Pain management: pain level controlled Vital Signs Assessment: post-procedure vital signs reviewed and stable Respiratory status: spontaneous breathing and respiratory function stable Cardiovascular status: stable Anesthetic complications: no     Last Vitals:  Vitals:   08/19/18 0935 08/19/18 0939  BP:    Pulse: 95 93  Resp: 15 10  Temp:    SpO2: 96% 92%    Last Pain:  Vitals:   08/19/18 0939  TempSrc:   PainSc: Asleep                 KEPHART,WILLIAM K

## 2018-08-19 NOTE — Interval H&P Note (Signed)
History and Physical Interval Note:  08/19/2018 7:21 AM  Katie Guzman  has presented today for surgery, with the diagnosis of INFECTION RIGHT FOOT.  The various methods of treatment have been discussed with the patient and family. After consideration of risks, benefits and other options for treatment, the patient has consented to  Procedure(s): DEBRIDEMENT WOUND RIGHT FOOT, DIABETIC (Right) as a surgical intervention.  The patient's history has been reviewed, patient examined, no change in status, stable for surgery.  I have reviewed the patient's chart and labs.  Questions were answered to the patient's satisfaction.     [name]

## 2018-08-19 NOTE — Anesthesia Post-op Follow-up Note (Signed)
Anesthesia QCDR form completed.        

## 2018-08-19 NOTE — Progress Notes (Signed)
Talked to Dr. Cleda Mccreedy about patient's pain not being controlled well.  He gave me a verbal order to change the dose and frequency of Morphine.

## 2018-08-19 NOTE — Transfer of Care (Signed)
Immediate Anesthesia Transfer of Care Note  Patient: Katie Guzman  Procedure(s) Performed: DEBRIDEMENT WOUND RIGHT FOOT, DIABETIC (Right )  Patient Location: PACU  Anesthesia Type:General  Level of Consciousness: sedated  Airway & Oxygen Therapy: Patient Spontanous Breathing and Patient connected to face mask oxygen  Post-op Assessment: Report given to RN and Post -op Vital signs reviewed and stable  Post vital signs: Reviewed and stable  Last Vitals:  Vitals Value Taken Time  BP 110/66 08/19/18 0914  Temp    Pulse 96 08/19/18 0918  Resp 10 08/19/18 0918  SpO2 100 % 08/19/18 0918  Vitals shown include unvalidated device data.  Last Pain:  Vitals:   08/19/18 0602  TempSrc: Oral  PainSc:          Complications: No apparent anesthesia complications

## 2018-08-20 LAB — CBC WITH DIFFERENTIAL/PLATELET
Abs Immature Granulocytes: 0.07 10*3/uL (ref 0.00–0.07)
Basophils Absolute: 0 10*3/uL (ref 0.0–0.1)
Basophils Relative: 0 %
Eosinophils Absolute: 0.1 10*3/uL (ref 0.0–0.5)
Eosinophils Relative: 0 %
HCT: 42.7 % (ref 36.0–46.0)
Hemoglobin: 13.1 g/dL (ref 12.0–15.0)
Immature Granulocytes: 0 %
Lymphocytes Relative: 4 %
Lymphs Abs: 0.7 10*3/uL (ref 0.7–4.0)
MCH: 27.5 pg (ref 26.0–34.0)
MCHC: 30.7 g/dL (ref 30.0–36.0)
MCV: 89.7 fL (ref 80.0–100.0)
Monocytes Absolute: 0.8 10*3/uL (ref 0.1–1.0)
Monocytes Relative: 5 %
Neutro Abs: 14.4 10*3/uL — ABNORMAL HIGH (ref 1.7–7.7)
Neutrophils Relative %: 91 %
Platelets: 543 10*3/uL — ABNORMAL HIGH (ref 150–400)
RBC: 4.76 MIL/uL (ref 3.87–5.11)
RDW: 14 % (ref 11.5–15.5)
WBC: 16 10*3/uL — ABNORMAL HIGH (ref 4.0–10.5)
nRBC: 0 % (ref 0.0–0.2)

## 2018-08-20 LAB — CREATININE, SERUM
Creatinine, Ser: 0.75 mg/dL (ref 0.44–1.00)
GFR calc Af Amer: >60 mL/min (ref >60)
GFR calc non Af Amer: >60 mL/min (ref >60)

## 2018-08-20 LAB — URINE DRUG SCREEN, QUALITATIVE (ARMC ONLY)
Amphetamines, Ur Screen: NOT DETECTED
Barbiturates, Ur Screen: NOT DETECTED
Benzodiazepine, Ur Scrn: NOT DETECTED
Cannabinoid 50 Ng, Ur ~~LOC~~: POSITIVE — AB
Cocaine Metabolite,Ur ~~LOC~~: NOT DETECTED
MDMA (Ecstasy)Ur Screen: NOT DETECTED
Methadone Scn, Ur: NOT DETECTED
Opiate, Ur Screen: POSITIVE — AB
Phencyclidine (PCP) Ur S: NOT DETECTED
Tricyclic, Ur Screen: NOT DETECTED

## 2018-08-20 LAB — GLUCOSE, CAPILLARY
Glucose-Capillary: 283 mg/dL — ABNORMAL HIGH (ref 70–99)
Glucose-Capillary: 131 mg/dL — ABNORMAL HIGH (ref 70–99)
Glucose-Capillary: 152 mg/dL — ABNORMAL HIGH (ref 70–99)
Glucose-Capillary: 153 mg/dL — ABNORMAL HIGH (ref 70–99)

## 2018-08-20 MED ORDER — OXYCODONE HCL 5 MG PO TABS
10.0000 mg | ORAL_TABLET | ORAL | Status: DC | PRN
  Administered 2018-08-20 – 2018-08-24 (×20): 10 mg via ORAL
  Filled 2018-08-20 (×19): qty 2

## 2018-08-20 MED ORDER — BISACODYL 5 MG PO TBEC
5.0000 mg | DELAYED_RELEASE_TABLET | Freq: Every day | ORAL | Status: DC | PRN
  Administered 2018-08-20: 5 mg via ORAL
  Filled 2018-08-20 (×2): qty 1

## 2018-08-20 MED ORDER — ALUM & MAG HYDROXIDE-SIMETH 200-200-20 MG/5ML PO SUSP
30.0000 mL | Freq: Four times a day (QID) | ORAL | Status: DC | PRN
  Administered 2018-08-20: 30 mL via ORAL
  Filled 2018-08-20: qty 30

## 2018-08-20 MED ORDER — INSULIN ASPART 100 UNIT/ML ~~LOC~~ SOLN
3.0000 [IU] | Freq: Three times a day (TID) | SUBCUTANEOUS | Status: DC
  Administered 2018-08-20 – 2018-08-25 (×14): 3 [IU] via SUBCUTANEOUS
  Filled 2018-08-20 (×13): qty 1

## 2018-08-20 NOTE — Progress Notes (Signed)
[place] at [place] NAME: Katie Guzman    MR#:  [id number]  DATE OF BIRTH:  Dec 21, 1970  SUBJECTIVE:   The patient complains of acid reflux and constipation. REVIEW OF SYSTEMS:    Review of Systems  Constitutional: Negative for fever, chills weight loss HENT: Negative for ear pain, nosebleeds, congestion, facial swelling, rhinorrhea, neck pain, neck stiffness and ear discharge.   Respiratory: Negative for cough, shortness of breath, wheezing  Cardiovascular: Negative for chest pain, palpitations and leg swelling.  Gastrointestinal: Negative for heartburn, abdominal pain, vomiting, diarrhea but has constipation. Genitourinary: Negative for dysuria, urgency, frequency, hematuria Musculoskeletal: Negative for back pain or joint pain Neurological: Negative for dizziness, seizures, syncope, focal weakness,  numbness and headaches.  Hematological: Does not bruise/bleed easily.  Psychiatric/Behavioral: Negative for hallucinations, confusion, dysphoric mood. Tolerating Diet: yes DRUG ALLERGIES:   Allergies  Allergen Reactions  . Flexeril [Cyclobenzaprine] Hives and Nausea And Vomiting  . Hydrocodone Hives  . Other Hives  . Darvocet [Propoxyphene N-Acetaminophen] Hives and Nausea And Vomiting  . Fish-Derived Products Other (See Comments)    Patient does not like to eat fish. Not allergic but prefers not to.  . Propofol Nausea And Vomiting  . Toradol [Ketorolac Tromethamine] Nausea And Vomiting  . Vicodin [Hydrocodone-Acetaminophen] Hives and Nausea And Vomiting  . Ketorolac Nausea And Vomiting  . Tramadol Nausea And Vomiting    Vomiting only    VITALS:  Blood pressure (!) 146/85, pulse 89, temperature 98.5 F (36.9 C), temperature source Oral, resp. rate 18, height 5\' 2"  (1.575 m), weight 49.9 kg, SpO2 96 %.  PHYSICAL EXAMINATION:  Constitutional: Appears well-developed and well-nourished. No distress. HENT: Normocephalic. Marland Kitchen Oropharynx  is clear and moist.  Eyes: Conjunctivae and EOM are normal. PERRLA, no scleral icterus.  Neck: Normal ROM. Neck supple. No JVD. No tracheal deviation. CVS: RRR, S1/S2 +, no murmurs, no gallops, no carotid bruit.  Pulmonary: Effort and breath sounds normal, no stridor, rhonchi, wheezes, rales.  Abdominal: Soft. BS +,  no distension, tenderness, rebound or guarding.  Musculoskeletal: Normal range of motion. No edema and no tenderness.  Neuro: Alert. CN 2-12 grossly intact. No focal deficits. Skin: Right foot is dressed.  Left foot with first 2 toes amputated Psychiatric: Normal mood and affect.      LABORATORY PANEL:   CBC Recent Labs  Lab 08/20/18 0444  WBC 16.0*  HGB 13.1  HCT 42.7  PLT 543*   ------------------------------------------------------------------------------------------------------------------  Chemistries  Recent Labs  Lab 08/18/18 1631 08/20/18 0444  NA 135  --   K 4.0  --   CL 98  --   CO2 30  --   GLUCOSE 185*  --   BUN 16  --   CREATININE 0.62 0.75  CALCIUM 8.6*  --   AST 11*  --   ALT 7  --   ALKPHOS 100  --   BILITOT <0.1*  --    ------------------------------------------------------------------------------------------------------------------  Cardiac Enzymes No results for input(s): TROPONINI in the last 168 hours. ------------------------------------------------------------------------------------------------------------------  RADIOLOGY:  Mr Foot Right Wo Contrast  Result Date: 08/19/2018 CLINICAL DATA:  Right foot pain and swelling. Plantar foot ulceration. EXAM: MRI OF THE RIGHT FOREFOOT WITHOUT CONTRAST TECHNIQUE: Multiplanar, multisequence MR imaging of the right forefoot was performed. No intravenous contrast was administered. COMPARISON:  Radiographs dated 08/06/2018 FINDINGS: Bones/Joint/Cartilage There is osteomyelitis involving the shaft and head of the first metatarsal and the base of the proximal phalanx of the great  toe. There  is osteomyelitis of the sesamoids. There is an abscess which extends around the first metatarsal and into the first MTP joint. The other bones of the forefoot demonstrate no evidence of osteomyelitis. Cyst in the distal shaft of the fifth metatarsal. Muscles and Tendons Fluid is within the flexor tendon of the great toe consistent with infectious tenosynovitis. There is abnormal edema in the muscles at the medial aspect of the forefoot consistent with myositis. Soft tissues Abscess surrounding the first metatarsal and the proximal phalanx of the great toe with adjacent myositis and cellulitis. IMPRESSION: 1. Osteomyelitis involving almost the entire first metatarsal as well as the base of the proximal phalanx of the great toe. 2. Extensive soft tissue abscess in the same area with adjacent myositis and cellulitis. Electronically Signed   By: Lorriane Shire M.D.   On: 08/19/2018 05:26     ASSESSMENT AND PLAN:   48 year old female with a history of diabetes and osteomyelitis status post left great and second toe amputation and ongoing tobacco abuse who was directly admitted from podiatry due to concern of osteomyelitis of first right foot metatarsal.  1.  Osteomyelitis involving entire first metatarsal and base of proximal phalanx of great toe: Patient is postoperative day #1 ray resection with amputation of great toe Continue Unasyn/vancomycin for now. Leukocytosis worsening, follow-up CBC.  2.  Diabetes: Continue with ADA diet, Lantus 18 unit daily and SSI, NovoLog 3 unit AC.  Diabetes nurse consultation appreciated  3. Tobacco dependence: Patient is encouraged to quit smoking and willing to attempt to quit was assessed. Patient highly motivated.Counseling was provided for 4 minutes. Nicotine patch placed  4.  COPD without signs of exacerbation: Continue inhalers Management plans discussed with the patient and she is in agreement.  CODE STATUS: full  TOTAL TIME TAKING CARE OF THIS PATIENT:  30 minutes.  POSSIBLE D/C ? days, DEPENDING ON CLINICAL CONDITION. Demetrios Loll M.D on 08/20/2018 at 3:01 PM  Between 7am to 6pm - Pager - [phone] After 6pm go to [email or web address] - password [email or web address] [facility]  Office  [phone]  CC: Primary care physician; Medicine, [facility]  Note: This dictation was prepared with Dragon dictation along with smaller phrase technology. Any transcriptional errors that result from this process are unintentional.

## 2018-08-20 NOTE — Progress Notes (Signed)
1 Day Post-Op   Subjective/Chief Complaint: Patient seen.  Still complaining of a lot of pain as well as some phantom pain in her right foot.  States that she had been taking 20 mg of oxycodone at home due to her laryngeal cancer and is questioning if her pain medicine can be bumped up.   Objective: Vital signs in last 24 hours: Temp:  [97.8 F (36.6 C)-98.7 F (37.1 C)] 98 F (36.7 C) (07/11 0543) Pulse Rate:  [90-99] 96 (07/11 0543) Resp:  [16-18] 16 (07/11 0543) BP: (144-164)/(80-88) 164/86 (07/11 0543) SpO2:  [92 %-99 %] 99 % (07/11 0543) Weight:  [49.9 kg] 49.9 kg (07/10 1408) Last BM Date: 08/18/18  Intake/Output from previous day: 07/10 0701 - 07/11 0700 In: 756 [I.V.:656; IV Piggyback:100] Out: 5 [Blood:5] Intake/Output this shift: No intake/output data recorded.  The bandage on the right foot is dry and intact with no evidence of any strikethrough.  White count is still significantly elevated.  Lab Results:  Recent Labs    08/18/18 1631 08/20/18 0444  WBC 11.4* 16.0*  HGB 12.2 13.1  HCT 39.2 42.7  PLT 480* 543*   BMET Recent Labs    08/18/18 1631 08/20/18 0444  NA 135  --   K 4.0  --   CL 98  --   CO2 30  --   GLUCOSE 185*  --   BUN 16  --   CREATININE 0.62 0.75  CALCIUM 8.6*  --    PT/INR No results for input(s): LABPROT, INR in the last 72 hours. ABG No results for input(s): PHART, HCO3 in the last 72 hours.  Invalid input(s): PCO2, PO2  Studies/Results: Mr Foot Right Wo Contrast  Result Date: 08/19/2018 CLINICAL DATA:  Right foot pain and swelling. Plantar foot ulceration. EXAM: MRI OF THE RIGHT FOREFOOT WITHOUT CONTRAST TECHNIQUE: Multiplanar, multisequence MR imaging of the right forefoot was performed. No intravenous contrast was administered. COMPARISON:  Radiographs dated 08/06/2018 FINDINGS: Bones/Joint/Cartilage There is osteomyelitis involving the shaft and head of the first metatarsal and the base of the proximal phalanx of the great  toe. There is osteomyelitis of the sesamoids. There is an abscess which extends around the first metatarsal and into the first MTP joint. The other bones of the forefoot demonstrate no evidence of osteomyelitis. Cyst in the distal shaft of the fifth metatarsal. Muscles and Tendons Fluid is within the flexor tendon of the great toe consistent with infectious tenosynovitis. There is abnormal edema in the muscles at the medial aspect of the forefoot consistent with myositis. Soft tissues Abscess surrounding the first metatarsal and the proximal phalanx of the great toe with adjacent myositis and cellulitis. IMPRESSION: 1. Osteomyelitis involving almost the entire first metatarsal as well as the base of the proximal phalanx of the great toe. 2. Extensive soft tissue abscess in the same area with adjacent myositis and cellulitis. Electronically Signed   By: Lorriane Shire M.D.   On: 08/19/2018 05:26    Anti-infectives: Anti-infectives (From admission, onward)   Start     Dose/Rate Route Frequency Ordered Stop   08/19/18 1800  vancomycin (VANCOCIN) IVPB 1000 mg/200 mL premix     1,000 mg 200 mL/hr over 60 Minutes Intravenous Every 24 hours 08/18/18 1717     08/18/18 2030  Vancomycin (VANCOCIN) 1,250 mg in sodium chloride 0.9 % 250 mL IVPB     1,250 mg 166.7 mL/hr over 90 Minutes Intravenous  Once 08/18/18 2019 08/19/18 0037   08/18/18 1800  Ampicillin-Sulbactam (UNASYN) 3 g in sodium chloride 0.9 % 100 mL IVPB     3 g 200 mL/hr over 30 Minutes Intravenous Every 6 hours 08/18/18 1717     08/18/18 1800  vancomycin (VANCOCIN) 1,250 mg in sodium chloride 0.9 % 250 mL IVPB  Status:  Discontinued     1,250 mg 166.7 mL/hr over 90 Minutes Intravenous  Once 08/18/18 1717 08/18/18 2019      Assessment/Plan: s/p Procedure(s): DEBRIDEMENT WOUND RIGHT FOOT, DIABETIC (Right) AMPUTATION TOE (Right) AMPUTATION RAY (Right) Assessment: Osteomyelitis status post ray resection.   Plan: We will increase her  oxycodone to 10 mg every 4 hours.  At this point we will leave the bandage intact and plan for a dressing change tomorrow.  Discussed with the patient the difficulty of sending her home on a PICC line due to a history of drug use which she adamantly denies.  Discussed with the patient that this may cause an extended admission and patient states that she will leave because she has to go see her grandchildren.  Discussed with the patient that we need to make sure that this infection is under control or she is at significant risk for losing her leg.  Plan for follow-up tomorrow.  LOS: 2 days    [name] 08/20/2018

## 2018-08-21 LAB — GLUCOSE, CAPILLARY
Glucose-Capillary: 239 mg/dL — ABNORMAL HIGH (ref 70–99)
Glucose-Capillary: 174 mg/dL — ABNORMAL HIGH (ref 70–99)
Glucose-Capillary: 241 mg/dL — ABNORMAL HIGH (ref 70–99)
Glucose-Capillary: 236 mg/dL — ABNORMAL HIGH (ref 70–99)

## 2018-08-21 LAB — CBC
HCT: 39.6 % (ref 36.0–46.0)
Hemoglobin: 12.2 g/dL (ref 12.0–15.0)
MCH: 27.5 pg (ref 26.0–34.0)
MCHC: 30.8 g/dL (ref 30.0–36.0)
MCV: 89.4 fL (ref 80.0–100.0)
Platelets: 473 10*3/uL — ABNORMAL HIGH (ref 150–400)
RBC: 4.43 MIL/uL (ref 3.87–5.11)
RDW: 14.2 % (ref 11.5–15.5)
WBC: 11.4 10*3/uL — ABNORMAL HIGH (ref 4.0–10.5)
nRBC: 0 % (ref 0.0–0.2)

## 2018-08-21 MED ORDER — MORPHINE SULFATE (PF) 4 MG/ML IV SOLN
4.0000 mg | Freq: Four times a day (QID) | INTRAVENOUS | Status: DC | PRN
  Administered 2018-08-22 (×2): 4 mg via INTRAVENOUS
  Filled 2018-08-21 (×3): qty 1

## 2018-08-21 MED ORDER — PANTOPRAZOLE SODIUM 40 MG PO TBEC
40.0000 mg | DELAYED_RELEASE_TABLET | Freq: Every day | ORAL | Status: DC
  Administered 2018-08-21 – 2018-08-25 (×5): 40 mg via ORAL
  Filled 2018-08-21 (×5): qty 1

## 2018-08-21 NOTE — Progress Notes (Signed)
ID Chart reviewed Patient Vitals for the past 24 hrs:  BP Temp Temp src Pulse Resp SpO2  08/21/18 0503 119/71 98.3 F (36.8 C) Oral 93 16 93 %  08/20/18 2038 (!) 141/75 98.3 F (36.8 C) Oral (!) 101 20 93 %  08/20/18 1144 (!) 146/85 98.5 F (36.9 C) Oral 89 18 96 %   CBC Latest Ref Rng & Units 08/21/2018 08/20/2018 08/18/2018  WBC 4.0 - 10.5 K/uL 11.4(H) 16.0(H) 11.4(H)  Hemoglobin 12.0 - 15.0 g/dL 12.2 13.1 12.2  Hematocrit 36.0 - 46.0 % 39.6 42.7 39.2  Platelets 150 - 400 K/uL 473(H) 543(H) 480(H)   CMP Latest Ref Rng & Units 08/20/2018 08/18/2018 08/06/2018  Glucose 70 - 99 mg/dL - 185(H) 240(H)  BUN 6 - 20 mg/dL - 16 26(H)  Creatinine 0.44 - 1.00 mg/dL 0.75 0.62 0.86  Sodium 135 - 145 mmol/L - 135 134(L)  Potassium 3.5 - 5.1 mmol/L - 4.0 4.1  Chloride 98 - 111 mmol/L - 98 95(L)  CO2 22 - 32 mmol/L - 30 28  Calcium 8.9 - 10.3 mg/dL - 8.6(L) 9.5  Total Protein 6.5 - 8.1 g/dL - 6.5 -  Total Bilirubin 0.3 - 1.2 mg/dL - <0.1(L) -  Alkaline Phos 38 - 126 U/L - 100 -  AST 15 - 41 U/L - 11(L) -  ALT 0 - 44 U/L - 7 -   Rt foot infection s/p rt great toe amputation and partial first ray excision Cultures sent from OR still pending Bone pathology pending Currently on vanco and unasyn Will decide on further management depending on culture and pathology report. Discussed with Dr.Cline

## 2018-08-21 NOTE — Progress Notes (Addendum)
[facility] at [place] NAME: Katie Guzman    MR#:  [id number]  DATE OF BIRTH:  Jul 28, 1970  SUBJECTIVE:   The patient complains of acid reflux and constipation resolved. REVIEW OF SYSTEMS:    Review of Systems  Constitutional: Negative for fever, chills weight loss HENT: Negative for ear pain, nosebleeds, congestion, facial swelling, rhinorrhea, neck pain, neck stiffness and ear discharge.   Respiratory: Negative for cough, shortness of breath, wheezing  Cardiovascular: Negative for chest pain, palpitations and leg swelling.  Gastrointestinal: Negative for heartburn, abdominal pain, vomiting, diarrhea but has constipation. Genitourinary: Negative for dysuria, urgency, frequency, hematuria Musculoskeletal: Negative for back pain or joint pain Neurological: Negative for dizziness, seizures, syncope, focal weakness,  numbness and headaches.  Hematological: Does not bruise/bleed easily.  Psychiatric/Behavioral: Negative for hallucinations, confusion, dysphoric mood. Tolerating Diet: yes DRUG ALLERGIES:   Allergies  Allergen Reactions  . Flexeril [Cyclobenzaprine] Hives and Nausea And Vomiting  . Hydrocodone Hives  . Other Hives  . Darvocet [Propoxyphene N-Acetaminophen] Hives and Nausea And Vomiting  . Fish-Derived Products Other (See Comments)    Patient does not like to eat fish. Not allergic but prefers not to.  . Propofol Nausea And Vomiting  . Toradol [Ketorolac Tromethamine] Nausea And Vomiting  . Vicodin [Hydrocodone-Acetaminophen] Hives and Nausea And Vomiting  . Ketorolac Nausea And Vomiting  . Tramadol Nausea And Vomiting    Vomiting only    VITALS:  Blood pressure 105/70, pulse 95, temperature 98.3 F (36.8 C), temperature source Oral, resp. rate 16, height 5\' 2"  (1.575 m), weight 49.9 kg, SpO2 97 %.  PHYSICAL EXAMINATION:  Constitutional: Appears well-developed and well-nourished. No distress. HENT: Normocephalic. .   Eyes: Conjunctivae and EOM are normal. PERRLA, no scleral icterus.  Neck: Normal ROM. Neck supple. No JVD. No tracheal deviation. CVS: RRR, S1/S2 +, no murmurs, no gallops, no carotid bruit.  Pulmonary: Effort and breath sounds normal, no stridor, rhonchi, wheezes, rales.  Abdominal: Soft. BS +,  no distension, tenderness, rebound or guarding.  Musculoskeletal: Normal range of motion. No edema and no tenderness.  Right foot in dressing. Neuro: Alert. CN 2-12 grossly intact. No focal deficits. Skin: No rash or jaundice. Psychiatric: Normal mood and affect.  LABORATORY PANEL:   CBC Recent Labs  Lab 08/21/18 0449  WBC 11.4*  HGB 12.2  HCT 39.6  PLT 473*   ------------------------------------------------------------------------------------------------------------------  Chemistries  Recent Labs  Lab 08/18/18 1631 08/20/18 0444  NA 135  --   K 4.0  --   CL 98  --   CO2 30  --   GLUCOSE 185*  --   BUN 16  --   CREATININE 0.62 0.75  CALCIUM 8.6*  --   AST 11*  --   ALT 7  --   ALKPHOS 100  --   BILITOT <0.1*  --    ------------------------------------------------------------------------------------------------------------------  Cardiac Enzymes No results for input(s): TROPONINI in the last 168 hours. ------------------------------------------------------------------------------------------------------------------  RADIOLOGY:  No results found.   ASSESSMENT AND PLAN:   48 year old female with a history of diabetes and osteomyelitis status post left great and second toe amputation and ongoing tobacco abuse who was directly admitted from podiatry due to concern of osteomyelitis of first right foot metatarsal.  1.  Osteomyelitis involving entire first metatarsal and base of proximal phalanx of great toe: Patient is postoperative day #3 ray resection with amputation of great toe Continue Unasyn/vancomycin for now. Leukocytosis, improved. Dr. Arlyss Repress will decide on  further management depending on culture and pathology report.  2.  Diabetes: Continue with ADA diet, Lantus 18 unit daily and SSI, NovoLog 3 unit AC.  3. Tobacco dependence: Patient is encouraged to quit smoking and willing to attempt to quit was assessed. Patient highly motivated.Counseling was provided for 4 minutes. Nicotine patch placed  4.  COPD without signs of exacerbation: Continue inhalers Management plans discussed with the patient and she is in agreement.  CODE STATUS: full  TOTAL TIME TAKING CARE OF THIS PATIENT: 26 minutes.  POSSIBLE D/C ? days, DEPENDING ON CLINICAL CONDITION. Demetrios Loll M.D on 08/21/2018 at 12:20 PM  Between 7am to 6pm - Pager - [phone] After 6pm go to [email or web address] - password [email or web address] [facility]  Office  [phone]  CC: Primary care physician; Medicine, [facility]  Note: This dictation was prepared with Dragon dictation along with smaller phrase technology. Any transcriptional errors that result from this process are unintentional.

## 2018-08-21 NOTE — Progress Notes (Signed)
2 Days Post-Op   Subjective/Chief Complaint: Patient seen.  Still complains of a lot of pain in her right foot.   Objective: Vital signs in last 24 hours: Temp:  [98.3 F (36.8 C)-98.5 F (36.9 C)] 98.3 F (36.8 C) (07/12 0503) Pulse Rate:  [89-101] 93 (07/12 0503) Resp:  [16-20] 16 (07/12 0503) BP: (119-146)/(71-85) 119/71 (07/12 0503) SpO2:  [93 %-96 %] 93 % (07/12 0503) Last BM Date: 08/20/18  Intake/Output from previous day: 07/11 0701 - 07/12 0700 In: 1844.8 [P.O.:240; I.V.:1304.8; IV Piggyback:300] Out: 600 [Urine:600] Intake/Output this shift: No intake/output data recorded.  Some bleeding through is noted on the bandaging.  Upon removal there is some moderate to heavy bleeding from the wound with some minimal drainage, consistent for the antibiotic beads.  The incision is well coapted with significant improvement in the erythema and edema although there is some early signs of incisional necrosis distally.  White count markedly improved.  Lab Results:  Recent Labs    08/20/18 0444 08/21/18 0449  WBC 16.0* 11.4*  HGB 13.1 12.2  HCT 42.7 39.6  PLT 543* 473*   BMET Recent Labs    08/18/18 1631 08/20/18 0444  NA 135  --   K 4.0  --   CL 98  --   CO2 30  --   GLUCOSE 185*  --   BUN 16  --   CREATININE 0.62 0.75  CALCIUM 8.6*  --    PT/INR No results for input(s): LABPROT, INR in the last 72 hours. ABG No results for input(s): PHART, HCO3 in the last 72 hours.  Invalid input(s): PCO2, PO2  Studies/Results: No results found.  Anti-infectives: Anti-infectives (From admission, onward)   Start     Dose/Rate Route Frequency Ordered Stop   08/19/18 1800  vancomycin (VANCOCIN) IVPB 1000 mg/200 mL premix     1,000 mg 200 mL/hr over 60 Minutes Intravenous Every 24 hours 08/18/18 1717     08/18/18 2030  Vancomycin (VANCOCIN) 1,250 mg in sodium chloride 0.9 % 250 mL IVPB     1,250 mg 166.7 mL/hr over 90 Minutes Intravenous  Once 08/18/18 2019 08/19/18 0037    08/18/18 1800  Ampicillin-Sulbactam (UNASYN) 3 g in sodium chloride 0.9 % 100 mL IVPB     3 g 200 mL/hr over 30 Minutes Intravenous Every 6 hours 08/18/18 1717     08/18/18 1800  vancomycin (VANCOCIN) 1,250 mg in sodium chloride 0.9 % 250 mL IVPB  Status:  Discontinued     1,250 mg 166.7 mL/hr over 90 Minutes Intravenous  Once 08/18/18 1717 08/18/18 2019      Assessment/Plan: s/p Procedure(s): DEBRIDEMENT WOUND RIGHT FOOT, DIABETIC (Right) AMPUTATION TOE (Right) AMPUTATION RAY (Right) Assessment: Stable status post ray resection right foot.   Plan: Betadine and sterile gauze bandage reapplied to the right foot.  Discussed with the patient that we will need to keep a close eye on the distal part of her incision as this may require some revision.  Plan for reevaluation of the wound in a couple of days.  LOS: 3 days    [name] 08/21/2018

## 2018-08-22 LAB — GLUCOSE, CAPILLARY
Glucose-Capillary: 159 mg/dL — ABNORMAL HIGH (ref 70–99)
Glucose-Capillary: 180 mg/dL — ABNORMAL HIGH (ref 70–99)
Glucose-Capillary: 261 mg/dL — ABNORMAL HIGH (ref 70–99)
Glucose-Capillary: 109 mg/dL — ABNORMAL HIGH (ref 70–99)

## 2018-08-22 LAB — AEROBIC/ANAEROBIC CULTURE W GRAM STAIN (SURGICAL/DEEP WOUND)

## 2018-08-22 MED ORDER — ACETAMINOPHEN 325 MG PO TABS
650.0000 mg | ORAL_TABLET | Freq: Four times a day (QID) | ORAL | Status: DC | PRN
  Administered 2018-08-25: 650 mg via ORAL
  Filled 2018-08-22: qty 2

## 2018-08-22 MED ORDER — MORPHINE SULFATE (PF) 4 MG/ML IV SOLN
4.0000 mg | INTRAVENOUS | Status: DC | PRN
  Administered 2018-08-22 – 2018-08-24 (×13): 4 mg via INTRAVENOUS
  Filled 2018-08-22 (×13): qty 1

## 2018-08-22 MED ORDER — NICOTINE POLACRILEX 2 MG MT GUM
4.0000 mg | CHEWING_GUM | OROMUCOSAL | Status: DC | PRN
  Filled 2018-08-22: qty 2

## 2018-08-22 NOTE — Progress Notes (Signed)
Pt came to the desk asking for her IV to be unhooked or she was going to take it off herself as she was going downstairs to drop off an envelope with some money for her grandson for his birthday tomorrow ad nobody was going to stop her. Nurse unhooked the IV and the Tech agreed at the Pt's request to take her down in a wheelchair. Tech and Pt came back to floor over 30 mins. later after Tech noted Pt. picked up a cigarette butt outside and started to smoked it as she had a lighter on her.Pt family never came and Pt told Tech that she would have to take her back down later when family get to the hospital. I discussed this with the night supervisor who spoke to the Pt about going outside the hospital to meet with family. Supervisor noted that staff can take the envelope down but Pt responded as though she was not giving her money to the staff. Supervisor also suggested that Security can take it down and she agreed. Security was called and they later explained that they can't take money out to family. After this was relayed to Pt she got very agitated and physically aggressive hitting the table with her fists and slamming things on the table.Pt was told that nurse would leave the room and would come back when she had calmed down. Nurse then observed Pt with orthopedic Boot on her foot and envelope in hand walking off the unit. Charge nurse was then informed of this and staff informed Pt that she could not leave the unit by herself as it was not safe and also that she could not leave the building. Pt got verbally and physically abusive to staff pointing finger and getting up in their faces cursing. Catering manager were alerted. Pt was allowed to give her daughter the envelope and daughter gave her a bag which Pt stated was just food, Pt was told that the bag had to be searched and she proceeded to take the items out of the bag which included a new pack of cigarettes, Pt. Had been found smoking in her bathroom on  Thursday.

## 2018-08-22 NOTE — Progress Notes (Signed)
[facility] at [place] NAME: Katie Guzman    MR#:  [id number]  DATE OF BIRTH:  06-22-1970  SUBJECTIVE:   The patient complains of surgical wound pain and wants more than medication. REVIEW OF SYSTEMS:    Review of Systems  Constitutional: Negative for fever, chills weight loss HENT: Negative for ear pain, nosebleeds, congestion, facial swelling, rhinorrhea, neck pain, neck stiffness and ear discharge.   Respiratory: Negative for cough, shortness of breath, wheezing  Cardiovascular: Negative for chest pain, palpitations and leg swelling.  Gastrointestinal: Negative for heartburn, abdominal pain, vomiting, diarrhea but has constipation. Genitourinary: Negative for dysuria, urgency, frequency, hematuria Musculoskeletal: Negative for back pain or joint pain Neurological: Negative for dizziness, seizures, syncope, focal weakness,  numbness and headaches.  Hematological: Does not bruise/bleed easily.  Psychiatric/Behavioral: Negative for hallucinations, confusion, dysphoric mood. Tolerating Diet: yes DRUG ALLERGIES:   Allergies  Allergen Reactions  . Flexeril [Cyclobenzaprine] Hives and Nausea And Vomiting  . Hydrocodone Hives  . Other Hives  . Darvocet [Propoxyphene N-Acetaminophen] Hives and Nausea And Vomiting  . Fish-Derived Products Other (See Comments)    Patient does not like to eat fish. Not allergic but prefers not to.  . Propofol Nausea And Vomiting  . Toradol [Ketorolac Tromethamine] Nausea And Vomiting  . Vicodin [Hydrocodone-Acetaminophen] Hives and Nausea And Vomiting  . Ketorolac Nausea And Vomiting  . Tramadol Nausea And Vomiting    Vomiting only    VITALS:  Blood pressure 125/70, pulse 96, temperature 98.3 F (36.8 C), temperature source Oral, resp. rate 20, height 5\' 2"  (1.575 m), weight 49.9 kg, SpO2 98 %.  PHYSICAL EXAMINATION:  Constitutional: Appears well-developed and well-nourished. No distress. HENT:  Normocephalic. .  Eyes: Conjunctivae and EOM are normal. PERRLA, no scleral icterus.  Neck: Normal ROM. Neck supple. No JVD. No tracheal deviation. CVS: RRR, S1/S2 +, no murmurs, no gallops, no carotid bruit.  Pulmonary: Effort and breath sounds normal, no stridor, rhonchi, wheezes, rales.  Abdominal: Soft. BS +,  no distension, tenderness, rebound or guarding.  Musculoskeletal: Normal range of motion. No edema and no tenderness.  Right foot in dressing. Neuro: Alert. CN 2-12 grossly intact. No focal deficits. Skin: No rash or jaundice. Psychiatric: Normal mood and affect.  LABORATORY PANEL:   CBC Recent Labs  Lab 08/21/18 0449  WBC 11.4*  HGB 12.2  HCT 39.6  PLT 473*   ------------------------------------------------------------------------------------------------------------------  Chemistries  Recent Labs  Lab 08/18/18 1631 08/20/18 0444  NA 135  --   K 4.0  --   CL 98  --   CO2 30  --   GLUCOSE 185*  --   BUN 16  --   CREATININE 0.62 0.75  CALCIUM 8.6*  --   AST 11*  --   ALT 7  --   ALKPHOS 100  --   BILITOT <0.1*  --    ------------------------------------------------------------------------------------------------------------------  Cardiac Enzymes No results for input(s): TROPONINI in the last 168 hours. ------------------------------------------------------------------------------------------------------------------  RADIOLOGY:  No results found.   ASSESSMENT AND PLAN:   48 year old female with a history of diabetes and osteomyelitis status post left great and second toe amputation and ongoing tobacco abuse who was directly admitted from podiatry due to concern of osteomyelitis of first right foot metatarsal.  1.  Osteomyelitis involving entire first metatarsal and base of proximal phalanx of great toe: Patient is s/p resection with amputation of great toe Continue Unasyn/vancomycin for now. Leukocytosis, improved. Dr. Arlyss Repress will decide on  further management depending on culture and pathology report.  2.  Diabetes: Continue with ADA diet, Lantus 18 unit daily and SSI, NovoLog 3 unit AC.  3. Tobacco dependence: Patient is encouraged to quit smoking and willing to attempt to quit was assessed. Patient highly motivated.Counseling was provided for 4 minutes. Nicotine patch placed  4.  COPD without signs of exacerbation: Continue inhalers Management plans discussed with the patient and she is in agreement.  CODE STATUS: full  TOTAL TIME TAKING CARE OF THIS PATIENT: 26 minutes.  POSSIBLE D/C ? days, DEPENDING ON CLINICAL CONDITION. Demetrios Loll M.D on 08/22/2018 at 2:28 PM  Between 7am to 6pm - Pager - [phone] After 6pm go to [email or web address] - password [email or web address] [facility]  Office  [phone]  CC: Primary care physician; Medicine, [facility]  Note: This dictation was prepared with Dragon dictation along with smaller phrase technology. Any transcriptional errors that result from this process are unintentional.

## 2018-08-22 NOTE — Progress Notes (Signed)
3 Days Post-Op   Subjective/Chief Complaint: Patient seen.  Still complains of severe pain in the right foot.  Thinks the bandage may be draining some.   Objective: Vital signs in last 24 hours: Temp:  [98.2 F (36.8 C)-98.3 F (36.8 C)] 98.3 F (36.8 C) (07/13 1130) Pulse Rate:  [88-96] 96 (07/13 1130) Resp:  [17-20] 20 (07/13 1130) BP: (110-125)/(70-72) 125/70 (07/13 1130) SpO2:  [98 %] 98 % (07/13 1130) Last BM Date: 08/20/18  Intake/Output from previous day: 07/12 0701 - 07/13 0700 In: 1152.7 [IV Piggyback:1152.7] Out: 2800 [Urine:2800] Intake/Output this shift: Total I/O In: -  Out: 400 [Urine:400]  No evidence of strikethrough.  Upon removal there is still some moderate drainage from the bandaging.  Less blood.  The incision is well coapted with significantly improved erythema and edema.  Some distal incisional necrosis but this appears to be drying up with no clear evidence of infection.  Lab Results:  Recent Labs    08/20/18 0444 08/21/18 0449  WBC 16.0* 11.4*  HGB 13.1 12.2  HCT 42.7 39.6  PLT 543* 473*   BMET Recent Labs    08/20/18 0444  CREATININE 0.75   PT/INR No results for input(s): LABPROT, INR in the last 72 hours. ABG No results for input(s): PHART, HCO3 in the last 72 hours.  Invalid input(s): PCO2, PO2  Studies/Results: No results found.  Anti-infectives: Anti-infectives (From admission, onward)   Start     Dose/Rate Route Frequency Ordered Stop   08/19/18 1800  vancomycin (VANCOCIN) IVPB 1000 mg/200 mL premix     1,000 mg 200 mL/hr over 60 Minutes Intravenous Every 24 hours 08/18/18 1717     08/18/18 2030  Vancomycin (VANCOCIN) 1,250 mg in sodium chloride 0.9 % 250 mL IVPB     1,250 mg 166.7 mL/hr over 90 Minutes Intravenous  Once 08/18/18 2019 08/19/18 0037   08/18/18 1800  Ampicillin-Sulbactam (UNASYN) 3 g in sodium chloride 0.9 % 100 mL IVPB     3 g 200 mL/hr over 30 Minutes Intravenous Every 6 hours 08/18/18 1717     08/18/18  1800  vancomycin (VANCOCIN) 1,250 mg in sodium chloride 0.9 % 250 mL IVPB  Status:  Discontinued     1,250 mg 166.7 mL/hr over 90 Minutes Intravenous  Once 08/18/18 1717 08/18/18 2019      Assessment/Plan: s/p Procedure(s): DEBRIDEMENT WOUND RIGHT FOOT, DIABETIC (Right) AMPUTATION TOE (Right) AMPUTATION RAY (Right) Assessment: Stable status post ray resection right foot.   Plan: Betadine and a sterile bandage reapplied to the right foot.  Continue using her wedge shoe when walking.  We will repeat her CBC tomorrow.  Plan for dressing change tomorrow morning.  LOS: 4 days    [name] 08/22/2018

## 2018-08-22 NOTE — Consult Note (Addendum)
Pharmacy Antibiotic Note  Katie Guzman is a 48 y.o. female admitted on 08/18/2018 with Wound infection.  Pharmacy has been consulted for Unasyn and vancomycin dosing. She has a chronic history of ulceration with recent development of abscess on her right foot, MRI shows osteomyelitis. This is day #6 of IV antibiotics.  Plan: 1) continue Unasyn 3 g q6H.   2) continue vancomycin 1000 mg q24H. Will obtain  VP @2100  on 7/14 and VT @1830  on 7/15 Expected AUC: 461 SCr used: 0.80 (rounded up)    Temp (24hrs), Avg:98.3 F (36.8 C), Min:98.2 F (36.8 C), Max:98.3 F (36.8 C)  Recent Labs  Lab 08/18/18 1631 08/20/18 0444 08/21/18 0449  WBC 11.4* 16.0* 11.4*  CREATININE 0.62 0.75  --     Estimated Creatinine Clearance: 67 mL/min (by C-G formula based on SCr of 0.75 mg/dL).    Antimicrobials this admission: 7/9 Unasyn >>  7/9 Vancomycin >>   Microbiology results: 7/10 WCx pending 7/10 Bone Cx: pending 7/9 BCx NGTD  Thank you for allowing pharmacy to be a part of this patient's care.  Rowland Lathe, PharmD 08/22/2018 7:48 AM

## 2018-08-23 LAB — CBC WITH DIFFERENTIAL/PLATELET
Abs Immature Granulocytes: 0.05 10*3/uL (ref 0.00–0.07)
Basophils Absolute: 0.1 10*3/uL (ref 0.0–0.1)
Basophils Relative: 0 %
Eosinophils Absolute: 0.4 10*3/uL (ref 0.0–0.5)
Eosinophils Relative: 3 %
HCT: 39.2 % (ref 36.0–46.0)
Hemoglobin: 11.8 g/dL — ABNORMAL LOW (ref 12.0–15.0)
Immature Granulocytes: 0 %
Lymphocytes Relative: 5 %
Lymphs Abs: 0.6 10*3/uL — ABNORMAL LOW (ref 0.7–4.0)
MCH: 27 pg (ref 26.0–34.0)
MCHC: 30.1 g/dL (ref 30.0–36.0)
MCV: 89.7 fL (ref 80.0–100.0)
Monocytes Absolute: 0.8 10*3/uL (ref 0.1–1.0)
Monocytes Relative: 7 %
Neutro Abs: 9.7 10*3/uL — ABNORMAL HIGH (ref 1.7–7.7)
Neutrophils Relative %: 85 %
Platelets: 460 10*3/uL — ABNORMAL HIGH (ref 150–400)
RBC: 4.37 MIL/uL (ref 3.87–5.11)
RDW: 14.4 % (ref 11.5–15.5)
WBC: 11.5 10*3/uL — ABNORMAL HIGH (ref 4.0–10.5)
nRBC: 0 % (ref 0.0–0.2)

## 2018-08-23 LAB — CREATININE, SERUM
Creatinine, Ser: 0.56 mg/dL (ref 0.44–1.00)
GFR calc Af Amer: >60 mL/min (ref >60)
GFR calc non Af Amer: >60 mL/min (ref >60)

## 2018-08-23 LAB — CULTURE, BLOOD (ROUTINE X 2)
Culture: NO GROWTH
Culture: NO GROWTH
Special Requests: ADEQUATE
Special Requests: ADEQUATE

## 2018-08-23 LAB — GLUCOSE, CAPILLARY
Glucose-Capillary: 218 mg/dL — ABNORMAL HIGH (ref 70–99)
Glucose-Capillary: 121 mg/dL — ABNORMAL HIGH (ref 70–99)
Glucose-Capillary: 221 mg/dL — ABNORMAL HIGH (ref 70–99)
Glucose-Capillary: 219 mg/dL — ABNORMAL HIGH (ref 70–99)

## 2018-08-23 LAB — VANCOMYCIN, PEAK: Vancomycin Pk: 27 ug/mL — ABNORMAL LOW (ref 30–40)

## 2018-08-23 NOTE — Progress Notes (Signed)
ID Waiting on culture and bone pathology to decide on the antibiotic and route and duration. Will avoid home IV if possible.

## 2018-08-23 NOTE — Progress Notes (Signed)
4 Days Post-Op   Subjective/Chief Complaint: Patient seen.  Still complains of pain in the right foot.  States she twisted her leg when she got up to go to the bathroom quickly last night.   Objective: Vital signs in last 24 hours: Temp:  [98.3 F (36.8 C)-99 F (37.2 C)] 99 F (37.2 C) (07/14 0503) Pulse Rate:  [95-96] 95 (07/14 0503) Resp:  [18-20] 18 (07/14 0503) BP: (125-144)/(70-76) 144/76 (07/14 0503) SpO2:  [92 %-98 %] 92 % (07/14 0503) Last BM Date: 08/21/18  Intake/Output from previous day: 07/13 0701 - 07/14 0700 In: -  Out: 1700 [Urine:1700] Intake/Output this shift: No intake/output data recorded.  Still some moderate drainage on the bandaging.  No specific purulence noted.  The incision is well coapted with some continued distal incisional necrosis which appears stable.  Mild erythema just proximal to this area but no other clear demarcation.  White count still elevated.  Lab Results:  Recent Labs    08/21/18 0449 08/23/18 0316  WBC 11.4* 11.5*  HGB 12.2 11.8*  HCT 39.6 39.2  PLT 473* 460*   BMET No results for input(s): NA, K, CL, CO2, GLUCOSE, BUN, CREATININE, CALCIUM in the last 72 hours. PT/INR No results for input(s): LABPROT, INR in the last 72 hours. ABG No results for input(s): PHART, HCO3 in the last 72 hours.  Invalid input(s): PCO2, PO2  Studies/Results: No results found.  Anti-infectives: Anti-infectives (From admission, onward)   Start     Dose/Rate Route Frequency Ordered Stop   08/19/18 1800  vancomycin (VANCOCIN) IVPB 1000 mg/200 mL premix     1,000 mg 200 mL/hr over 60 Minutes Intravenous Every 24 hours 08/18/18 1717     08/18/18 2030  Vancomycin (VANCOCIN) 1,250 mg in sodium chloride 0.9 % 250 mL IVPB     1,250 mg 166.7 mL/hr over 90 Minutes Intravenous  Once 08/18/18 2019 08/19/18 0037   08/18/18 1800  Ampicillin-Sulbactam (UNASYN) 3 g in sodium chloride 0.9 % 100 mL IVPB     3 g 200 mL/hr over 30 Minutes Intravenous Every 6  hours 08/18/18 1717     08/18/18 1800  vancomycin (VANCOCIN) 1,250 mg in sodium chloride 0.9 % 250 mL IVPB  Status:  Discontinued     1,250 mg 166.7 mL/hr over 90 Minutes Intravenous  Once 08/18/18 1717 08/18/18 2019      Assessment/Plan: s/p Procedure(s): DEBRIDEMENT WOUND RIGHT FOOT, DIABETIC (Right) AMPUTATION TOE (Right) AMPUTATION RAY (Right) Assessment: Stable status post debridement   Plan: Patient was evaluated with Dr. Vickki Muff.  Discussed with the patient that at this point it appears stable but she is still not out of the woods as far as the possibility of needing a second debridement.  Awaiting on cultures and hopefully we can send the patient home on oral antibiotics with home health care but that is still to be determined.  Betadine and a sterile bandage reapplied to the right foot.  Plan for dressing change again on Thursday  LOS: 5 days    [name] 08/23/2018

## 2018-08-24 ENCOUNTER — Inpatient Hospital Stay: Payer: Medicaid Other

## 2018-08-24 DIAGNOSIS — F17200 Nicotine dependence, unspecified, uncomplicated: Secondary | ICD-10-CM

## 2018-08-24 DIAGNOSIS — Z590 Homelessness: Secondary | ICD-10-CM

## 2018-08-24 DIAGNOSIS — E118 Type 2 diabetes mellitus with unspecified complications: Secondary | ICD-10-CM

## 2018-08-24 DIAGNOSIS — B966 Bacteroides fragilis [B. fragilis] as the cause of diseases classified elsewhere: Secondary | ICD-10-CM

## 2018-08-24 DIAGNOSIS — Z872 Personal history of diseases of the skin and subcutaneous tissue: Secondary | ICD-10-CM

## 2018-08-24 LAB — GLUCOSE, CAPILLARY
Glucose-Capillary: 259 mg/dL — ABNORMAL HIGH (ref 70–99)
Glucose-Capillary: 204 mg/dL — ABNORMAL HIGH (ref 70–99)
Glucose-Capillary: 215 mg/dL — ABNORMAL HIGH (ref 70–99)
Glucose-Capillary: 104 mg/dL — ABNORMAL HIGH (ref 70–99)

## 2018-08-24 LAB — CREATININE, SERUM
Creatinine, Ser: 0.68 mg/dL (ref 0.44–1.00)
GFR calc Af Amer: >60 mL/min (ref >60)
GFR calc non Af Amer: >60 mL/min (ref >60)

## 2018-08-24 LAB — AEROBIC/ANAEROBIC CULTURE W GRAM STAIN (SURGICAL/DEEP WOUND): Culture: NO GROWTH

## 2018-08-24 LAB — VANCOMYCIN, TROUGH: Vancomycin Tr: 5 ug/mL — ABNORMAL LOW (ref 15–20)

## 2018-08-24 MED ORDER — ONDANSETRON HCL 4 MG/2ML IJ SOLN
4.0000 mg | Freq: Four times a day (QID) | INTRAMUSCULAR | Status: DC
  Administered 2018-08-25: 4 mg via INTRAVENOUS
  Filled 2018-08-24: qty 2

## 2018-08-24 MED ORDER — ONDANSETRON HCL 4 MG/2ML IJ SOLN
INTRAMUSCULAR | Status: AC
  Administered 2018-08-24: 4 mg
  Filled 2018-08-24: qty 2

## 2018-08-24 MED ORDER — NALOXONE HCL 0.4 MG/ML IJ SOLN
0.4000 mg | INTRAMUSCULAR | Status: DC | PRN
  Filled 2018-08-24: qty 1

## 2018-08-24 MED ORDER — OXYCODONE HCL 5 MG PO TABS
5.0000 mg | ORAL_TABLET | Freq: Four times a day (QID) | ORAL | Status: DC | PRN
  Administered 2018-08-25 (×2): 5 mg via ORAL
  Filled 2018-08-24 (×2): qty 1

## 2018-08-24 MED ORDER — MORPHINE SULFATE (PF) 2 MG/ML IV SOLN
2.0000 mg | INTRAVENOUS | Status: DC | PRN
  Administered 2018-08-25 (×3): 2 mg via INTRAVENOUS
  Filled 2018-08-24 (×3): qty 1

## 2018-08-24 MED ORDER — INSULIN GLARGINE 100 UNIT/ML ~~LOC~~ SOLN
20.0000 [IU] | Freq: Every day | SUBCUTANEOUS | Status: DC
  Administered 2018-08-25: 20 [IU] via SUBCUTANEOUS
  Filled 2018-08-24 (×3): qty 0.2

## 2018-08-24 MED ORDER — NALOXONE HCL 0.4 MG/ML IJ SOLN
INTRAMUSCULAR | Status: AC
  Administered 2018-08-24: 21:00:00
  Filled 2018-08-24: qty 1

## 2018-08-24 NOTE — Progress Notes (Signed)
Nutrition Follow Up Note   DOCUMENTATION CODES:   Not applicable  INTERVENTION:   Prostat liquid protein PO 30 ml BID with meals, each supplement provides 100 kcal, 15 grams protein.  Ocuvite daily for wound healing (provides zinc, vitamin A, vitamin C, Vitamin E, copper, and selenium)  Vitamin C 250mg  po BID  NUTRITION DIAGNOSIS:   Increased nutrient needs related to wound healing as evidenced by increased estimated needs.  GOAL:   Patient will meet greater than or equal to 90% of their needs  -progressing   MONITOR:   PO intake, Supplement acceptance, Labs, Weight trends, Skin, I & O's  ASSESSMENT:   48 year old female with a history of diabetes, COPD, throat cancer s/p chemo/XRT, osteomyelitis status post left great and second toe amputation and ongoing tobacco abuse who was directly admitted from podiatry due to concern of osteomyelitis of first right foot metatarsal.  RD working remotely.  Pt with good appetite and oral intake; pt eating 100% of meals and taking most of her prostat supplements. Continue supplements and vitamins to support wound healing. No new weight since admit; will request weekly weights.   Medications reviewed and include: insulin, nicotine, ocuvite, protonix, miralax, senokot, vitamin C, unasyn   Labs reviewed: wbc- 11.5(H) cbgs- 259, 204 x 24 hrs  Diet Order:   Diet Order            Diet Carb Modified Fluid consistency: Thin; Room service appropriate? Yes  Diet effective now             EDUCATION NEEDS:   Education needs have been addressed  Skin:  Skin Assessment: Reviewed RN Assessment(incision R foot)  Last BM:  7/15- type 4  Height:   Ht Readings from Last 1 Encounters:  08/19/18 5\' 2"  (1.575 m)    Weight:   Wt Readings from Last 1 Encounters:  08/19/18 49.9 kg    Ideal Body Weight:  45.4 kg  BMI:  Body mass index is 20.12 kg/m.  Estimated Nutritional Needs:   Kcal:  1400-1600kcal/day  Protein:   70-80g/day  Fluid:  >1.4L/day  Koleen Distance MS, RD, LDN Pager #- [phone] Office#- [phone] After Hours Pager: [phone]

## 2018-08-24 NOTE — Progress Notes (Signed)
ID Pt stable Washing her hair in the bathroom and weight bearing on the rt foot without the wedge Says she could feel the bandage wet and bleeding inside- says she probably "busted the foot"  Patient Vitals for the past 24 hrs:  BP Temp Temp src Pulse Resp SpO2  08/24/18 1222 127/70 98.8 F (37.1 C) Oral 92 16 91 %  08/24/18 0418 122/77 98.7 F (37.1 C) Oral 99 18 96 %  08/23/18 2020 121/71 98.9 F (37.2 C) Oral 97 20 90 %  08/23/18 1511 120/70 98.8 F (37.1 C) Oral 93 19 99 %    O/e of the rt foot- dressing removed- some bloody drainage soaking the bandage Minimal erythema- great toe amputated surgical site well coapted- tip has some necrosis ( likely the skin only)     Prior to surgery     CBC Latest Ref Rng & Units 08/23/2018 08/21/2018 08/20/2018  WBC 4.0 - 10.5 K/uL 11.5(H) 11.4(H) 16.0(H)  Hemoglobin 12.0 - 15.0 g/dL 11.8(L) 12.2 13.1  Hematocrit 36.0 - 46.0 % 39.2 39.6 42.7  Platelets 150 - 400 K/uL 460(H) 473(H) 543(H)   CMP Latest Ref Rng & Units 08/24/2018 08/23/2018 08/20/2018  Glucose 70 - 99 mg/dL - - -  BUN 6 - 20 mg/dL - - -  Creatinine 0.44 - 1.00 mg/dL 0.68 0.56 0.75  Sodium 135 - 145 mmol/L - - -  Potassium 3.5 - 5.1 mmol/L - - -  Chloride 98 - 111 mmol/L - - -  CO2 22 - 32 mmol/L - - -  Calcium 8.9 - 10.3 mg/dL - - -  Total Protein 6.5 - 8.1 g/dL - - -  Total Bilirubin 0.3 - 1.2 mg/dL - - -  Alkaline Phos 38 - 126 U/L - - -  AST 15 - 41 U/L - - -  ALT 0 - 44 U/L - - -   7/9 Blood culture negative 7/10 Bone culture no growth 7/10 Wound/abscess culture -bacteroides  Impression/Recommedation 48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 She  could have had MSSA like before when she had bacteremia and   Bone biopsy - prelim report focal osteo- more cuts are being done as per Dr.Jones She is not a candidate for IV antibiotics for home because eof substance use and home situation ( says she is homeless and would go to live with her daughter for a short time)    will explore Short stay long acting glycopeptide infusion .  DM- poorly controlled - on insulin  Current smoker  Past h/o MSSA bacteremia and left toe gangrenous infection in Nov 2019

## 2018-08-24 NOTE — Progress Notes (Signed)
[facility] [place] NAME: Katie Guzman    MR#:  [id number]  DATE OF BIRTH:  [birth year]  SUBJECTIVE:   The patient complains of better right foot pain. REVIEW OF SYSTEMS:    Review of Systems  Constitutional: Negative for fever, chills weight loss HENT: Negative for ear pain, nosebleeds, congestion, facial swelling, rhinorrhea, neck pain, neck stiffness and ear discharge.   Respiratory: Negative for cough, shortness of breath, wheezing  Cardiovascular: Negative for chest pain, palpitations and leg swelling.  Gastrointestinal: Negative for heartburn, abdominal pain, vomiting, diarrhea but has constipation. Genitourinary: Negative for dysuria, urgency, frequency, hematuria Musculoskeletal: Negative for back pain or joint pain Neurological: Negative for dizziness, seizures, syncope, focal weakness,  numbness and headaches.  Hematological: Does not bruise/bleed easily.  Psychiatric/Behavioral: Negative for hallucinations, confusion, dysphoric mood. Tolerating Diet: yes DRUG ALLERGIES:   Allergies  Allergen Reactions  . Flexeril [Cyclobenzaprine] Hives and Nausea And Vomiting  . Hydrocodone Hives  . Other Hives  . Darvocet [Propoxyphene N-Acetaminophen] Hives and Nausea And Vomiting  . Fish-Derived Products Other (See Comments)    Patient does not like to eat fish. Not allergic but prefers not to.  . Propofol Nausea And Vomiting  . Toradol [Ketorolac Tromethamine] Nausea And Vomiting  . Vicodin [Hydrocodone-Acetaminophen] Hives and Nausea And Vomiting  . Ketorolac Nausea And Vomiting  . Tramadol Nausea And Vomiting    Vomiting only    VITALS:  Blood pressure 127/70, pulse 92, temperature 98.8 F (37.1 C), temperature source Oral, resp. rate 16, height 5\' 2"  (1.575 m), weight 49.9 kg, SpO2 91 %.  PHYSICAL EXAMINATION:  Constitutional: Appears well-developed and well-nourished. No distress. HENT: Normocephalic. .  Eyes: Conjunctivae  and EOM are normal. PERRLA, no scleral icterus.  Neck: Normal ROM. Neck supple. No JVD. No tracheal deviation. CVS: RRR, S1/S2 +, no murmurs, no gallops, no carotid bruit.  Pulmonary: Effort and breath sounds normal, no stridor, rhonchi, wheezes, rales.  Abdominal: Soft. BS +,  no distension, tenderness, rebound or guarding.  Musculoskeletal: Normal range of motion. No edema and no tenderness.  Right foot in dressing. Neuro: Alert. CN 2-12 grossly intact. No focal deficits. Skin: No rash or jaundice. Psychiatric: Normal mood and affect.  LABORATORY PANEL:   CBC Recent Labs  Lab 08/23/18 0316  WBC 11.5*  HGB 11.8*  HCT 39.2  PLT 460*   ------------------------------------------------------------------------------------------------------------------  Chemistries  Recent Labs  Lab 08/18/18 1631  08/24/18 0623  NA 135  --   --   K 4.0  --   --   CL 98  --   --   CO2 30  --   --   GLUCOSE 185*  --   --   BUN 16  --   --   CREATININE 0.62   < > 0.68  CALCIUM 8.6*  --   --   AST 11*  --   --   ALT 7  --   --   ALKPHOS 100  --   --   BILITOT <0.1*  --   --    < > = values in this interval not displayed.   ------------------------------------------------------------------------------------------------------------------  Cardiac Enzymes No results for input(s): TROPONINI in the last 168 hours. ------------------------------------------------------------------------------------------------------------------  RADIOLOGY:  No results found.   ASSESSMENT AND PLAN:   48 year old female with a history of diabetes and osteomyelitis status post left great and second toe amputation and ongoing tobacco abuse who was directly admitted from podiatry due  to concern of osteomyelitis of first right foot metatarsal.  1.  Osteomyelitis involving entire first metatarsal and base of proximal phalanx of great toe: Patient is s/p resection with amputation of great toe Continue Unasyn,  vancomycin is discontinued. Leukocytosis, improved. Dr. Arlyss Repress will decide on further management depending on culture and pathology report.  2.  Diabetes: Continue with ADA diet, Lantus 20 unit daily and SSI, NovoLog 3 unit AC.  3. Tobacco dependence: Patient is encouraged to quit smoking and willing to attempt to quit was assessed. Nicotine patch placed  4.  COPD without signs of exacerbation: Continue inhalers Management plans discussed with the patient and she is in agreement.  CODE STATUS: full  TOTAL TIME TAKING CARE OF THIS PATIENT: 25 minutes.  POSSIBLE D/C ? days, DEPENDING ON CLINICAL CONDITION. Demetrios Loll M.D on 08/24/2018 at 3:29 PM  Between 7am to 6pm - Pager - [phone] After 6pm go to [email or web address] - password [email or web address] [facility]  Office  [phone]  CC: Primary care physician; Medicine, [facility]  Note: This dictation was prepared with Dragon dictation along with smaller phrase technology. Any transcriptional errors that result from this process are unintentional.

## 2018-08-24 NOTE — Progress Notes (Signed)
Rapid Response Event Note  Overview:called to room pt noted to be somnolent lying in bed nurse and AC at bedside obtaining vital signs      Initial Focused Assessment: pt vital signs wnL .standing orders placed for 1x NArcan .04mg  iv and MD called for further orders.   Interventions:Narcan given shortly pt. Started to arouse and started to dry heaving the nurse was on the phone with the NP for furthering orders    Plan of Care (if not transferred):monitor pt and decrease pain meds and place a telesitter  Event Summary:   at  2025    at          [facility]

## 2018-08-24 NOTE — TOC Initial Note (Addendum)
Transition of Care [facility]) - Initial/Assessment Note    Patient Details  Name: Katie Guzman MRN: [id number] Date of Birth: 11/16/1970  Transition of [facility] CM/SW Contact:    Beverly Sessions, RN Phone Number: 08/24/2018, 4:44 PM  Clinical Narrative:                    RNCM assessment completed Full note to follow     Patient Goals and CMS Choice        Expected Discharge Plan and Services                                                Prior Living Arrangements/Services                       Activities of Daily Living Home Assistive Devices/Equipment: Gilford Rile (specify type) ADL Screening (condition at time of admission) Patient's cognitive ability adequate to safely complete daily activities?: Yes Is the patient deaf or have difficulty hearing?: No Does the patient have difficulty seeing, even when wearing glasses/contacts?: No Does the patient have difficulty concentrating, remembering, or making decisions?: No Patient able to express need for assistance with ADLs?: Yes Does the patient have difficulty dressing or bathing?: No Independently performs ADLs?: Yes (appropriate for developmental age) Dressing (OT): Independent Does the patient have difficulty walking or climbing stairs?: Yes Weakness of Legs: Both Weakness of Arms/Hands: None  Permission Sought/Granted                  Emotional Assessment              Admission diagnosis:  Right Foot infection Cellulitis Patient Active Problem List   Diagnosis Date Noted  . Cellulitis of right foot 08/18/2018  . Diabetic ulcer of right foot associated with diabetes mellitus due to underlying condition ([place]) 07/29/2018  . Diabetic foot infection ([place]) 03/27/2017  . Skin ulcer ([place]) 08/23/2016  . Diabetic foot ulcer ([place]) 08/23/2016  . Diabetic ulcer of toe of left foot associated with type 2 diabetes mellitus ([place])   . Other depression due to general medical condition  04/17/2015  . Tenosynovitis of foot 04/16/2015  . Hypokalemia 04/16/2015  . Cellulitis 04/15/2015  . Diabetes mellitus ([place]) 04/15/2015  . Pain syndrome, chronic 04/15/2015  . COPD (chronic obstructive pulmonary disease) ([place]) 04/15/2015  . Diabetic neuropathy ([place]) 04/15/2015  . Diabetes mellitus with complication ([place])   . COPD exacerbation ([place]) 10/19/2014   PCP:  Medicine, [place]:   [place], [place] - [address] [place] [address] Phone: [phone] Fax: [place], [place] - [place] [address] Phone: [phone] Fax: [phone]  [id number] [address], [place] [address] Phone: [phone] Fax: [phone]     Social Determinants of Health (SDOH) Interventions    Readmission Risk Interventions No flowsheet data found.

## 2018-08-24 NOTE — Plan of Care (Signed)
  Problem: Clinical Measurements: Goal: Ability to maintain clinical measurements within normal limits will improve Outcome: Progressing Goal: Will remain free from infection Outcome: Progressing Goal: Diagnostic test results will improve Outcome: Progressing Goal: Respiratory complications will improve Outcome: Progressing Goal: Cardiovascular complication will be avoided Outcome: Progressing   Problem: Pain Managment: Goal: General experience of comfort will improve Outcome: Progressing  Patient with frequent requests for pain medication

## 2018-08-25 LAB — GLUCOSE, CAPILLARY: Glucose-Capillary: 164 mg/dL — ABNORMAL HIGH (ref 70–99)

## 2018-08-25 LAB — SEDIMENTATION RATE: Sed Rate: 37 mm/hr — ABNORMAL HIGH (ref 0–20)

## 2018-08-25 LAB — SURGICAL PATHOLOGY

## 2018-08-25 LAB — C-REACTIVE PROTEIN: CRP: 5 mg/dL — ABNORMAL HIGH (ref <1.0)

## 2018-08-25 MED ORDER — LEVOFLOXACIN 500 MG PO TABS: 500.0000 mg | ORAL_TABLET | Freq: Every day | ORAL | 0 refills | Status: AC

## 2018-08-25 MED ORDER — METRONIDAZOLE 500 MG PO TABS: 500.0000 mg | ORAL_TABLET | Freq: Three times a day (TID) | ORAL | 0 refills | Status: AC

## 2018-08-25 MED ORDER — OXYCODONE HCL 5 MG PO TABS: 5.0000 mg | ORAL_TABLET | Freq: Four times a day (QID) | ORAL | 0 refills | Status: AC | PRN

## 2018-08-25 MED ORDER — LEVOFLOXACIN 500 MG PO TABS
500.0000 mg | ORAL_TABLET | Freq: Every day | ORAL | Status: DC
  Administered 2018-08-25: 500 mg via ORAL
  Filled 2018-08-25: qty 1

## 2018-08-25 MED ORDER — METRONIDAZOLE 500 MG PO TABS
500.0000 mg | ORAL_TABLET | Freq: Three times a day (TID) | ORAL | Status: DC
  Administered 2018-08-25: 500 mg via ORAL
  Filled 2018-08-25 (×3): qty 1

## 2018-08-25 NOTE — Progress Notes (Signed)
Rapid response initiated. Pt. somnolent responds to sternal rub. Pupils pinpoint. Found empty bottle of Oxycodone 10mg  filled on 08-15-2018 (pt. has been here since 08-18-2018), bottle of Suboxone with 1 SL film left filled 08-02-2018, and several other items. Called for Narcan order. Medications to pharmacy. Order for telemonitor, CT of head and decrease in dosage of meds given to patient.

## 2018-08-25 NOTE — Progress Notes (Signed)
ID Pt with foot infection on the rt side with amputation of the great toe with partial ray She was on Augmentin for nealry 10 days before presentation and hence aerobic culture was neg but she had bacteroides in the wound but not the bone- pathology shows focal osteomyelitis ESR 37- she was getting vanco and unasyn during this hospitalization. She has h/o MSSA bacteremia and MSSA left foot infection Nov 2019 As she has multiple factors that prevent home IV infusion ( polysubstance use, Homelessness) will do oritvancin 1251m infusion once a week ( at the short stay ) for 2- doses. Third dose will be decided after OP assessment And Po levaquin 5072monce a day + PO flagyl 50087mO tid for 2 weeks. Podiatrist Dr.Cline and Myself will evalauate her as OP and decide on continuing antibiotic. Weekly labs CBC/CMP in the short stay Discussed the management with the patient and Dr.Cline and her nurse and communicated with the entire care team.

## 2018-08-25 NOTE — Progress Notes (Signed)
Pt. awake requesting pain medication. Upset own medications were taken to pharmacy. I talked with pt. about the condition we found her in, giving her Narcan, and that she can not keep her own medications with her. She denies using her own medications. She states, "I get sleepy like that from my Neurontin".  Upset about telemonitor in her room. Reviewed our role in keeping her safe while in the hospital. Security at bedside.

## 2018-08-25 NOTE — Progress Notes (Signed)
6 Days Post-Op   Subjective/Chief Complaint: Patient seen.  Still some pain in the foot.   Objective: Vital signs in last 24 hours: Temp:  [97.7 F (36.5 C)-98.8 F (37.1 C)] 97.7 F (36.5 C) (07/15 2031) Pulse Rate:  [89-92] 89 (07/15 2031) Resp:  [16-20] 20 (07/15 2031) BP: (126-127)/(63-70) 126/63 (07/15 2031) SpO2:  [91 %-92 %] 92 % (07/15 2031) Weight:  [50.1 kg] 50.1 kg (07/15 1425) Last BM Date: 08/22/18  Intake/Output from previous day: 07/15 0701 - 07/16 0700 In: 400.1 [IV Piggyback:400.1] Out: 1600 [Urine:1600] Intake/Output this shift: Total I/O In: -  Out: 900 [Urine:900]  The bandage is dry and intact.  Upon removal there is some mild drainage noted from the distal aspect of the incision.  The incision is well coapted with stable incisional necrosis distally.  Possibly some mild early cyanotic changes dorsally but no sign of frank infection.  Lab Results:  Recent Labs    08/23/18 0316  WBC 11.5*  HGB 11.8*  HCT 39.2  PLT 460*   BMET Recent Labs    08/23/18 1334 08/24/18 0623  CREATININE 0.56 0.68   PT/INR No results for input(s): LABPROT, INR in the last 72 hours. ABG No results for input(s): PHART, HCO3 in the last 72 hours.  Invalid input(s): PCO2, PO2  Studies/Results: Ct Head Wo Contrast  Result Date: 08/24/2018 CLINICAL DATA:  Altered level of consciousness.  Confusion. EXAM: CT HEAD WITHOUT CONTRAST TECHNIQUE: Contiguous axial images were obtained from the base of the skull through the vertex without intravenous contrast. COMPARISON:  11/18/2014 FINDINGS: Brain: Suboptimal due to motion and atypical patient positioning. No sign acute infarction, hemorrhage, mass, hydrocephalus or extra-axial collection. Vascular: There is atherosclerotic calcification of the major vessels at the base of the brain. Skull: Negative Sinuses/Orbits: Clear/negative Other: None IMPRESSION: Motion degraded exam with atypical positioning. No abnormality is seen.  Electronically Signed   By: Nelson Chimes M.D.   On: 08/24/2018 21:46    Anti-infectives: Anti-infectives (From admission, onward)   Start     Dose/Rate Route Frequency Ordered Stop   08/19/18 1800  vancomycin (VANCOCIN) IVPB 1000 mg/200 mL premix  Status:  Discontinued     1,000 mg 200 mL/hr over 60 Minutes Intravenous Every 24 hours 08/18/18 1717 08/24/18 1101   08/18/18 2030  Vancomycin (VANCOCIN) 1,250 mg in sodium chloride 0.9 % 250 mL IVPB     1,250 mg 166.7 mL/hr over 90 Minutes Intravenous  Once 08/18/18 2019 08/19/18 0037   08/18/18 1800  Ampicillin-Sulbactam (UNASYN) 3 g in sodium chloride 0.9 % 100 mL IVPB     3 g 200 mL/hr over 30 Minutes Intravenous Every 6 hours 08/18/18 1717     08/18/18 1800  vancomycin (VANCOCIN) 1,250 mg in sodium chloride 0.9 % 250 mL IVPB  Status:  Discontinued     1,250 mg 166.7 mL/hr over 90 Minutes Intravenous  Once 08/18/18 1717 08/18/18 2019      Assessment/Plan: s/p Procedure(s): DEBRIDEMENT WOUND RIGHT FOOT, DIABETIC (Right) AMPUTATION TOE (Right) AMPUTATION RAY (Right) Assessment: Stable status post ray resection right foot.   Plan: Sterile dressing reapplied to the right foot.  Patient should be stable for discharge.  Still a significant chance of readmission in the future.  At this point I think it is reasonable to allow her to go home and wait for any further demarcation on the foot.  I will plan for follow-up outpatient in the office on Monday.  Patient was instructed to wear her  surgical shoe with pressure only on the heel and to stay off the foot.  Discussed that if she is noncompliant this significantly increases her risk of higher amputation.  LOS: 7 days    [name] 08/25/2018

## 2018-08-25 NOTE — Progress Notes (Signed)
MD notified: The bottle that they took down to pharmacy last night is empty and she indicates she will come right back to the ED if she does not get a prescription filled.

## 2018-08-25 NOTE — Progress Notes (Signed)
Called into room by nurse tech, Anderson Malta because patient wasn't responding and very lethargic.  Attempted to sternal rub, patient aroused and opened eyes but then proceeded to go back to sleep. Vital signs and blood sugar were normal, but oxygen level was 91-92% on room air.  Rapid response was called into room and they suggested a nasal cannula and narcan. Narcan was given and within 10 minutes, patient was more awake. Patient had several pain medications in her possession of which the nursing supervisor, Colletta Maryland and I took to pharmacy. Called the doctor to get a tele sitter and lowered pain medication dosage and frequency. Around 0130, patient woke up and was highly upset. Nursing supervisor and security were called. Explained to patient that pain medications were taken and given to pharmacy. She stated "I did not take that medication but that it was the neurontin that made me sleepy". Patient is very upset because her pain medications have been lowered. Will continue to monitor patient.  Christene Slates
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
## 2018-08-25 NOTE — Discharge Summary (Signed)
[facility] [place] NAME: Katie Guzman    MR#:  [id number]  DATE OF BIRTH:  06/09/1970  DATE OF ADMISSION:  08/18/2018   ADMITTING PHYSICIAN: Loletha Grayer, MD  DATE OF DISCHARGE:  08/25/2018 PRIMARY CARE PHYSICIAN: Medicine, [facility]   ADMISSION DIAGNOSIS:  Right Foot infection Cellulitis DISCHARGE DIAGNOSIS:  Active Problems:   Cellulitis of right foot  SECONDARY DIAGNOSIS:   Past Medical History:  Diagnosis Date   Asthma    Cancer ([place])    lung   Cellulitis 04/15/2015   COPD (chronic obstructive pulmonary disease) (HCC)    Diabetes mellitus    Poorly controlled with complications   Emphysema    Fibromyalgia    Neuropathy    Tenosynovitis of foot 04/16/2015   HOSPITAL COURSE:  48 year old female with a history of diabetes and osteomyelitis status post left great and second toe amputation and ongoing tobacco abuse who was directly admitted from podiatry due to concern of osteomyelitis of first right foot metatarsal.  1.  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 Leukocytosis, improved. Per Dr. Arlyss Repress, will do oritvancin 1200mg  infusion once a week ( at the short stay ) for 2- doses. Third dose will be decided after OP assessment; And Po levaquin 500mg  once a day + PO flagyl 500mg  PO tid for 2 weeks. The patient constantly requested pain medication due to foot pain.  I will give 3 days of oxycodone.  Advised patient avoid narcotics.  The patient needs to follow-up with pain clinic.  2.  Diabetes: She has been treated with with ADA diet, Lantus 20 unit daily and SSI, NovoLog 3 unit AC.  Resume home medication after discharge.  3. Tobacco dependence: Patient is encouraged to quit smoking and willing to attempt to quit was  assessed. Nicotine patch.  4.  COPD without signs of exacerbation: Continue inhalers DISCHARGE CONDITIONS:  Stable, discharge to home today. CONSULTS OBTAINED:  Treatment Team:  Sharlotte Alamo, DPM Tsosie Billing, MD DRUG ALLERGIES:   Allergies  Allergen Reactions   Flexeril [Cyclobenzaprine] Hives and Nausea And Vomiting   Hydrocodone Hives   Other Hives   Darvocet [Propoxyphene N-Acetaminophen] Hives and Nausea And Vomiting   Fish-Derived Products Other (See Comments)    Patient does not like to eat fish. Not allergic but prefers not to.   Propofol Nausea And Vomiting   Toradol [Ketorolac Tromethamine] Nausea And Vomiting   Vicodin [Hydrocodone-Acetaminophen] Hives and Nausea And Vomiting   Ketorolac Nausea And Vomiting   Tramadol Nausea And Vomiting    Vomiting only   DISCHARGE MEDICATIONS:   Allergies as of 08/25/2018      Reactions   Flexeril [cyclobenzaprine] Hives, Nausea And Vomiting   Hydrocodone Hives   Other Hives   Darvocet [propoxyphene N-acetaminophen] Hives, Nausea And Vomiting   Fish-derived Products Other (See Comments)   Patient does not like to eat fish. Not allergic but prefers not to.   Propofol Nausea And Vomiting   Toradol [ketorolac Tromethamine] Nausea And Vomiting   Vicodin [hydrocodone-acetaminophen] Hives, Nausea And Vomiting   Ketorolac Nausea And Vomiting   Tramadol Nausea And Vomiting   Vomiting only      Medication List    STOP taking these medications   elvitegravir-cobicistat-emtricitabine-tenofovir 150-150-200-10 MG Tabs tablet Commonly known as: GENVOYA   fluconazole 100 MG  tablet Commonly known as: DIFLUCAN   ondansetron 4 MG disintegrating tablet Commonly known as: Zofran ODT   oxyCODONE-acetaminophen 5-325 MG tablet Commonly known as: PERCOCET/ROXICET     TAKE these medications   acetaminophen 325 MG tablet Commonly known as: TYLENOL Take 650 mg by mouth every 6 (six) hours as needed for mild pain.    albuterol 108 (90 Base) MCG/ACT inhaler Commonly known as: VENTOLIN HFA Inhale 2 puffs into the lungs every 4 (four) hours as needed for wheezing or shortness of breath. For wheeze or shortness of breath What changed: when to take this   albuterol (2.5 MG/3ML) 0.083% nebulizer solution Commonly known as: PROVENTIL Take 2.5 mg by nebulization every 4 (four) hours as needed for wheezing or shortness of breath. What changed: Another medication with the same name was changed. Make sure you understand how and when to take each.   ascorbic acid 250 MG tablet Commonly known as: VITAMIN C Take 1 tablet (250 mg total) by mouth 2 (two) times daily.   escitalopram 10 MG tablet Commonly known as: LEXAPRO Take 20 mg by mouth every morning.   fluticasone 50 MCG/ACT nasal spray Commonly known as: FLONASE Place 1 spray into both nostrils daily.   Fluticasone-Salmeterol 250-50 MCG/DOSE Aepb Commonly known as: ADVAIR Inhale 1 puff into the lungs every 12 (twelve) hours.   gabapentin 800 MG tablet Commonly known as: NEURONTIN Take 1 tablet (800 mg total) by mouth 3 (three) times daily.   ibuprofen 800 MG tablet Commonly known as: ADVIL Take 800 mg by mouth every 6 (six) hours as needed for fever or mild pain.   insulin glargine 100 UNIT/ML injection Commonly known as: LANTUS Inject 0.25 mLs (25 Units total) into the skin at bedtime.   insulin lispro 100 UNIT/ML injection Commonly known as: HUMALOG Inject 1-18 Units into the skin 4 (four) times daily -  before meals and at bedtime. Sliding scale   levofloxacin 500 MG tablet Commonly known as: LEVAQUIN Take 1 tablet (500 mg total) by mouth daily for 14 days.   lidocaine 5 % Commonly known as: LIDODERM Place 1 patch onto the skin daily as needed. For pain. Remove & Discard patch within 12 hours or as directed by MD   metroNIDAZOLE 500 MG tablet Commonly known as: FLAGYL Take 1 tablet (500 mg total) by mouth 3 (three) times daily for  14 days.   nicotine 21 mg/24hr patch Commonly known as: NICODERM CQ - dosed in mg/24 hours Place 1 patch (21 mg total) onto the skin daily. What changed:   when to take this  reasons to take this   oxyCODONE 5 MG immediate release tablet Commonly known as: Oxy IR/ROXICODONE Take 1 tablet (5 mg total) by mouth every 6 (six) hours as needed for up to 3 days for moderate pain or severe pain. What changed:   medication strength  how much to take  when to take this  reasons to take this   senna-docusate 8.6-50 MG tablet Commonly known as: Senokot-S Take 1 tablet by mouth daily.   tiotropium 18 MCG inhalation capsule Commonly known as: SPIRIVA Place 1 capsule (18 mcg total) into inhaler and inhale daily.            Durable Medical Equipment  (From admission, onward)         Start     Ordered   08/25/18 1041  For home use only DME 4 wheeled rolling walker with seat  Once    Question:  Patient needs a walker to treat with the following condition  Answer:  Weakness   08/25/18 1040           DISCHARGE INSTRUCTIONS:  See AVS  If you experience worsening of your admission symptoms, develop shortness of breath, life threatening emergency, suicidal or homicidal thoughts you must seek medical attention immediately by calling 911 or calling your MD immediately  if symptoms less severe.  You Must read complete instructions/literature along with all the possible adverse reactions/side effects for all the Medicines you take and that have been prescribed to you. Take any new Medicines after you have completely understood and accpet all the possible adverse reactions/side effects.   Please note  You were cared for by a hospitalist during your hospital stay. If you have any questions about your discharge medications or the care you received while you were in the hospital after you are discharged, you can call the unit and asked to speak with the hospitalist on call if the  hospitalist that took care of you is not available. Once you are discharged, your primary care physician will handle any further medical issues. Please note that NO REFILLS for any discharge medications will be authorized once you are discharged, as it is imperative that you return to your primary care physician (or establish a relationship with a primary care physician if you do not have one) for your aftercare needs so that they can reassess your need for medications and monitor your lab values.    On the day of Discharge:  VITAL SIGNS:  Blood pressure 126/63, pulse 89, temperature 97.7 F (36.5 C), temperature source Oral, resp. rate 20, height 5\' 2"  (1.575 m), weight 50.1 kg, SpO2 92 %. PHYSICAL EXAMINATION:  GENERAL:  48 y.o.-year-old patient lying in the bed with no acute distress.  EYES: Pupils equal, round, reactive to light and accommodation. No scleral icterus. Extraocular muscles intact.  HEENT: Head atraumatic, normocephalic. NECK:  Supple, no jugular venous distention. No thyroid enlargement, no tenderness.  LUNGS: Normal breath sounds bilaterally, no wheezing, rales,rhonchi or crepitation. No use of accessory muscles of respiration.  CARDIOVASCULAR: S1, S2 normal. No murmurs, rubs, or gallops.  ABDOMEN: Soft, non-tender, non-distended. Bowel sounds present. No organomegaly or mass.  EXTREMITIES: No pedal edema, cyanosis, or clubbing.  Right foot in dressing. NEUROLOGIC: Cranial nerves II through XII are intact. Muscle strength 5/5 in all extremities. Sensation intact. Gait not checked.  PSYCHIATRIC: The patient is alert and oriented x 3.  SKIN: No obvious rash, lesion, or ulcer.  DATA REVIEW:   CBC Recent Labs  Lab 08/23/18 0316  WBC 11.5*  HGB 11.8*  HCT 39.2  PLT 460*    Chemistries  Recent Labs  Lab 08/18/18 1631  08/24/18 0623  NA 135  --   --   K 4.0  --   --   CL 98  --   --   CO2 30  --   --   GLUCOSE 185*  --   --   BUN 16  --   --   CREATININE 0.62    < > 0.68  CALCIUM 8.6*  --   --   AST 11*  --   --   ALT 7  --   --   ALKPHOS 100  --   --   BILITOT <0.1*  --   --    < > = values in this interval not displayed.     Microbiology Results  Results  for orders placed or performed during the hospital encounter of 08/18/18  Surgical PCR screen     Status: Abnormal   Collection Time: 08/18/18  6:32 AM   Specimen: Nasal Mucosa; Nasal Swab  Result Value Ref Range Status   MRSA, PCR NEGATIVE NEGATIVE Final   Staphylococcus aureus POSITIVE (A) NEGATIVE Final    Comment: RCRV ERICA HOPKINS AT 6712 ON 08/19/2018 KLM (NOTE) The Xpert SA Assay (FDA approved for NASAL specimens in patients 26 years of age and older), is one component of a comprehensive surveillance program. It is not intended to diagnose infection nor to guide or monitor treatment. Performed at [facility], [address] [place], [place] [address]   SARS Coronavirus 2 [facility] order, Performed in [facility] hospital lab)     Status: None   Collection Time: 08/18/18  5:39 PM   Specimen: Nasopharyngeal Swab  Result Value Ref Range Status   SARS Coronavirus 2 NEGATIVE NEGATIVE Final    Comment: (NOTE) If result is NEGATIVE SARS-CoV-2 target nucleic acids are NOT DETECTED. The SARS-CoV-2 RNA is generally detectable in upper and lower  respiratory specimens during the acute phase of infection. The lowest  concentration of SARS-CoV-2 viral copies this assay can detect is 250  copies / mL. A negative result does not preclude SARS-CoV-2 infection  and should not be used as the sole basis for treatment or other  patient management decisions.  A negative result may occur with  improper specimen collection / handling, submission of specimen other  than nasopharyngeal swab, presence of viral mutation(s) within the  areas targeted by this assay, and inadequate number of viral copies  (<250 copies / mL). A negative result must be combined with clinical  observations,  patient history, and epidemiological information. If result is POSITIVE SARS-CoV-2 target nucleic acids are DETECTED. The SARS-CoV-2 RNA is generally detectable in upper and lower  respiratory specimens dur ing the acute phase of infection.  Positive  results are indicative of active infection with SARS-CoV-2.  Clinical  correlation with patient history and other diagnostic information is  necessary to determine patient infection status.  Positive results do  not rule out bacterial infection or co-infection with other viruses. If result is PRESUMPTIVE POSTIVE SARS-CoV-2 nucleic acids MAY BE PRESENT.   A presumptive positive result was obtained on the submitted specimen  and confirmed on repeat testing.  While 2019 novel coronavirus  (SARS-CoV-2) nucleic acids may be present in the submitted sample  additional confirmatory testing may be necessary for epidemiological  and / or clinical management purposes  to differentiate between  SARS-CoV-2 and other Sarbecovirus currently known to infect humans.  If clinically indicated additional testing with an alternate test  methodology [phone]) is advised. The SARS-CoV-2 RNA is generally  detectable in upper and lower respiratory sp ecimens during the acute  phase of infection. The expected result is Negative. Fact Sheet for Patients:  StrictlyIdeas.no Fact Sheet for Healthcare Providers: BankingDealers.co.za This test is not yet approved or cleared by the [place] FDA and has been authorized for detection and/or diagnosis of SARS-CoV-2 by FDA under an Emergency Use Authorization (EUA).  This EUA will remain in effect (meaning this test can be used) for the duration of the COVID-19 declaration under Section 564(b)(1) of the Act, 21 U.S.C. section 360bbb-3(b)(1), unless the authorization is terminated or revoked sooner. Performed at [facility], [address]  [place], [place] [address]   CULTURE, BLOOD (ROUTINE X 2) w Reflex to  ID Panel     Status: None   Collection Time: 08/18/18  6:59 PM   Specimen: BLOOD  Result Value Ref Range Status   Specimen Description BLOOD RIGHT ANTECUBITAL  Final   Special Requests   Final    BOTTLES DRAWN AEROBIC AND ANAEROBIC Blood Culture adequate volume   Culture   Final    NO GROWTH 5 DAYS Performed at [facility], [address] [place], [place] [address]    Report Status 08/23/2018 FINAL  Final  CULTURE, BLOOD (ROUTINE X 2) w Reflex to ID Panel     Status: None   Collection Time: 08/18/18  6:59 PM   Specimen: BLOOD  Result Value Ref Range Status   Specimen Description BLOOD LEFT ANTECUBITAL  Final   Special Requests   Final    BOTTLES DRAWN AEROBIC AND ANAEROBIC Blood Culture adequate volume   Culture   Final    NO GROWTH 5 DAYS Performed at [facility], [address], [place], [place] [address]    Report Status 08/23/2018 FINAL  Final  Aerobic/Anaerobic Culture (surgical/deep wound)     Status: None   Collection Time: 08/19/18  8:45 AM   Specimen: Abscess  Result Value Ref Range Status   Specimen Description   Final    BONE Performed at [facility], [address], [place], [place] [address]    Special Requests   Final    RIGHT GREAT TOE Performed at [facility], [address] [place], [place] [address]    Gram Stain   Final    RARE WBC PRESENT, PREDOMINANTLY MONONUCLEAR NO ORGANISMS SEEN    Culture   Final    No growth aerobically or anaerobically. Performed at [facility], [place] [address] [place], [place] [address]    Report Status 08/24/2018 FINAL  Final  Aerobic/Anaerobic Culture (surgical/deep wound)     Status: None   Collection Time: 08/19/18  8:45 AM   Specimen: [facility] Bone biopsy; Tissue  Result Value Ref Range Status   Specimen Description   Final    ABSCESS Performed at [facility], [address]  [place], [place] [address]    Special Requests   Final    RIGHT GREAT TOE Performed at [facility], [place], [place] [address]    Gram Stain   Final    RARE WBC PRESENT,BOTH PMN AND MONONUCLEAR NO ORGANISMS SEEN    Culture   Final    FEW BACTEROIDES SPECIES NOT FRAGILIS BETA LACTAMASE POSITIVE Performed at [facility], [place] [address] [place], [place] [address]    Report Status 08/22/2018 FINAL  Final    RADIOLOGY:  Ct Head Wo Contrast  Result Date: 08/24/2018 CLINICAL DATA:  Altered level of consciousness.  Confusion. EXAM: CT HEAD WITHOUT CONTRAST TECHNIQUE: Contiguous axial images were obtained from the base of the skull through the vertex without intravenous contrast. COMPARISON:  11/18/2014 FINDINGS: Brain: Suboptimal due to motion and atypical patient positioning. No sign acute infarction, hemorrhage, mass, hydrocephalus or extra-axial collection. Vascular: There is atherosclerotic calcification of the major vessels at the base of the brain. Skull: Negative Sinuses/Orbits: Clear/negative Other: None IMPRESSION: Motion degraded exam with atypical positioning. No abnormality is seen. Electronically Signed   By: Nelson Chimes M.D.   On: 08/24/2018 21:46     Management plans discussed with the patient, family and they are in agreement.  CODE STATUS: Full Code   TOTAL TIME  TAKING CARE OF THIS PATIENT: 42 minutes.    Demetrios Loll M.D on 08/25/2018 at 1:32 PM  Between 7am to 6pm - Pager - [phone]  After 6pm go to [email or web address] - Proofreader  [facility]  Office  [phone]  CC: Primary care physician; Medicine, [facility]   Note: This dictation was prepared with Dragon dictation along with smaller phrase technology. Any transcriptional errors that result from this process are unintentional.

## 2018-08-25 NOTE — Discharge Instructions (Signed)
Smoking cessation  

## 2018-08-26 ENCOUNTER — Other Ambulatory Visit: Payer: Self-pay

## 2018-08-26 ENCOUNTER — Ambulatory Visit
Admit: 2018-08-26 | Discharge: 2018-08-26 | Disposition: A | Discharge status: Home / Self Care | Payer: Medicaid Other | Source: Ambulatory Visit | Attending: Obstetrics and Gynecology | Admitting: Obstetrics and Gynecology

## 2018-08-26 DIAGNOSIS — M869 Osteomyelitis, unspecified: Secondary | ICD-10-CM | POA: Diagnosis not present

## 2018-08-26 MED ORDER — ORITAVANCIN DIPHOSPHATE 400 MG IV SOLR
1200.0000 mg | Freq: Once | INTRAVENOUS | Status: AC
  Administered 2018-08-26: 1200 mg via INTRAVENOUS
  Filled 2018-08-26: qty 120

## 2018-08-26 MED ORDER — SODIUM CHLORIDE FLUSH 0.9 % IV SOLN
INTRAVENOUS | Status: AC
  Filled 2018-08-26: qty 10

## 2018-08-26 MED ORDER — ACETAMINOPHEN 500 MG PO TABS: 1000.0000 mg | ORAL_TABLET | Freq: Once | ORAL | Status: DC

## 2018-08-26 MED ORDER — ACETAMINOPHEN 500 MG PO TABS
ORAL_TABLET | ORAL | Status: AC
  Filled 2018-08-26: qty 2

## 2018-08-26 NOTE — Progress Notes (Signed)
Patient in same day surgery as a medical day appointment.  Patient to room 22 via wheelchair.  Patient states pain in her lower right leg/foot has increased today.  Patient states she took her prescribed pain medication without "much relief".  Extremity is elevated at this time.  Patient also states that the operative area has been bleeding; ace dressing is dry and intact at this time.  Patient has an appointment on this Monday to see Dr. Cleda Mccreedy.  Informed her to call his office regarding concerns that she feels should not wait until Monday.  Patient with verbal understanding; Patient in recliner with IV antibiotic infusing without difficulty.  Food and beverage given.

## 2018-08-30 ENCOUNTER — Other Ambulatory Visit: Payer: Self-pay | Admitting: Infectious Diseases

## 2018-08-30 DIAGNOSIS — M86271 Subacute osteomyelitis, right ankle and foot: Secondary | ICD-10-CM

## 2018-08-31 ENCOUNTER — Other Ambulatory Visit: Payer: Self-pay | Admitting: Licensed Clinical Social Worker

## 2018-08-31 DIAGNOSIS — M86271 Subacute osteomyelitis, right ankle and foot: Secondary | ICD-10-CM

## 2018-09-02 ENCOUNTER — Ambulatory Visit
Admission: RE | Admit: 2018-09-02 | Discharge: 2018-09-02 | Disposition: A | Discharge status: Home / Self Care | Payer: Medicaid Other | Source: Ambulatory Visit | Attending: Infectious Diseases | Admitting: Infectious Diseases

## 2018-09-02 ENCOUNTER — Other Ambulatory Visit: Payer: Self-pay

## 2018-09-02 DIAGNOSIS — M86271 Subacute osteomyelitis, right ankle and foot: Secondary | ICD-10-CM | POA: Diagnosis present

## 2018-09-02 LAB — COMPREHENSIVE METABOLIC PANEL
ALT: 10 U/L (ref 0–44)
AST: 14 U/L — ABNORMAL LOW (ref 15–41)
Albumin: 3.4 g/dL — ABNORMAL LOW (ref 3.5–5.0)
Alkaline Phosphatase: 82 U/L (ref 38–126)
Anion gap: 9 (ref 5–15)
BUN: 19 mg/dL (ref 6–20)
CO2: 30 mmol/L (ref 22–32)
Calcium: 9.2 mg/dL (ref 8.9–10.3)
Chloride: 96 mmol/L — ABNORMAL LOW (ref 98–111)
Creatinine, Ser: 0.66 mg/dL (ref 0.44–1.00)
GFR calc Af Amer: >60 mL/min (ref >60)
GFR calc non Af Amer: >60 mL/min (ref >60)
Glucose, Bld: 181 mg/dL — ABNORMAL HIGH (ref 70–99)
Potassium: 4.4 mmol/L (ref 3.5–5.1)
Sodium: 135 mmol/L (ref 135–145)
Total Bilirubin: 0.6 mg/dL (ref 0.3–1.2)
Total Protein: 6.9 g/dL (ref 6.5–8.1)

## 2018-09-02 LAB — CBC WITH DIFFERENTIAL/PLATELET
Abs Immature Granulocytes: 0.03 10*3/uL (ref 0.00–0.07)
Basophils Absolute: 0.1 10*3/uL (ref 0.0–0.1)
Basophils Relative: 1 %
Eosinophils Absolute: 0 10*3/uL (ref 0.0–0.5)
Eosinophils Relative: 0 %
HCT: 40.6 % (ref 36.0–46.0)
Hemoglobin: 12.9 g/dL (ref 12.0–15.0)
Immature Granulocytes: 0 %
Lymphocytes Relative: 4 %
Lymphs Abs: 0.5 10*3/uL — ABNORMAL LOW (ref 0.7–4.0)
MCH: 27.9 pg (ref 26.0–34.0)
MCHC: 31.8 g/dL (ref 30.0–36.0)
MCV: 87.9 fL (ref 80.0–100.0)
Monocytes Absolute: 0.4 10*3/uL (ref 0.1–1.0)
Monocytes Relative: 4 %
Neutro Abs: 9.5 10*3/uL — ABNORMAL HIGH (ref 1.7–7.7)
Neutrophils Relative %: 91 %
Platelets: 487 10*3/uL — ABNORMAL HIGH (ref 150–400)
RBC: 4.62 MIL/uL (ref 3.87–5.11)
RDW: 14.3 % (ref 11.5–15.5)
WBC: 10.5 10*3/uL (ref 4.0–10.5)
nRBC: 0 % (ref 0.0–0.2)

## 2018-09-02 LAB — SEDIMENTATION RATE: Sed Rate: 15 mm/hr (ref 0–20)

## 2018-09-02 LAB — GLUCOSE, CAPILLARY: Glucose-Capillary: 164 mg/dL — ABNORMAL HIGH (ref 70–99)

## 2018-09-02 MED ORDER — ORITAVANCIN DIPHOSPHATE 400 MG IV SOLR
INTRAVENOUS | Status: DC
  Administered 2018-09-02: 15:00:00 via INTRAVENOUS
  Filled 2018-09-02: qty 1000

## 2018-09-04 ENCOUNTER — Emergency Department: Payer: Medicaid Other

## 2018-09-04 ENCOUNTER — Emergency Department
Admission: EM | Admit: 2018-09-04 | Discharge: 2018-09-04 | Disposition: A | Discharge status: Home / Self Care | Payer: Medicaid Other | Attending: Student in an Organized Health Care Education/Training Program | Admitting: Student in an Organized Health Care Education/Training Program

## 2018-09-04 ENCOUNTER — Other Ambulatory Visit: Payer: Self-pay

## 2018-09-04 DIAGNOSIS — F17210 Nicotine dependence, cigarettes, uncomplicated: Secondary | ICD-10-CM | POA: Diagnosis not present

## 2018-09-04 DIAGNOSIS — Z794 Long term (current) use of insulin: Secondary | ICD-10-CM | POA: Diagnosis not present

## 2018-09-04 DIAGNOSIS — C109 Malignant neoplasm of oropharynx, unspecified: Secondary | ICD-10-CM | POA: Insufficient documentation

## 2018-09-04 DIAGNOSIS — K5900 Constipation, unspecified: Secondary | ICD-10-CM | POA: Diagnosis present

## 2018-09-04 DIAGNOSIS — Z85118 Personal history of other malignant neoplasm of bronchus and lung: Secondary | ICD-10-CM | POA: Insufficient documentation

## 2018-09-04 DIAGNOSIS — J45909 Unspecified asthma, uncomplicated: Secondary | ICD-10-CM | POA: Insufficient documentation

## 2018-09-04 DIAGNOSIS — Z89411 Acquired absence of right great toe: Secondary | ICD-10-CM | POA: Diagnosis not present

## 2018-09-04 DIAGNOSIS — J449 Chronic obstructive pulmonary disease, unspecified: Secondary | ICD-10-CM | POA: Diagnosis not present

## 2018-09-04 DIAGNOSIS — K5641 Fecal impaction: Secondary | ICD-10-CM | POA: Insufficient documentation

## 2018-09-04 DIAGNOSIS — E1140 Type 2 diabetes mellitus with diabetic neuropathy, unspecified: Secondary | ICD-10-CM | POA: Insufficient documentation

## 2018-09-04 LAB — COMPREHENSIVE METABOLIC PANEL
ALT: 10 U/L (ref 0–44)
AST: 13 U/L — ABNORMAL LOW (ref 15–41)
Albumin: 3.9 g/dL (ref 3.5–5.0)
Alkaline Phosphatase: 95 U/L (ref 38–126)
Anion gap: 11 (ref 5–15)
BUN: 20 mg/dL (ref 6–20)
CO2: 31 mmol/L (ref 22–32)
Calcium: 9.7 mg/dL (ref 8.9–10.3)
Chloride: 94 mmol/L — ABNORMAL LOW (ref 98–111)
Creatinine, Ser: 0.82 mg/dL (ref 0.44–1.00)
GFR calc Af Amer: >60 mL/min (ref >60)
GFR calc non Af Amer: >60 mL/min (ref >60)
Glucose, Bld: 214 mg/dL — ABNORMAL HIGH (ref 70–99)
Potassium: 4.2 mmol/L (ref 3.5–5.1)
Sodium: 136 mmol/L (ref 135–145)
Total Bilirubin: 0.7 mg/dL (ref 0.3–1.2)
Total Protein: 7.8 g/dL (ref 6.5–8.1)

## 2018-09-04 LAB — CBC
HCT: 42.1 % (ref 36.0–46.0)
Hemoglobin: 13.3 g/dL (ref 12.0–15.0)
MCH: 27.6 pg (ref 26.0–34.0)
MCHC: 31.6 g/dL (ref 30.0–36.0)
MCV: 87.3 fL (ref 80.0–100.0)
Platelets: 501 10*3/uL — ABNORMAL HIGH (ref 150–400)
RBC: 4.82 MIL/uL (ref 3.87–5.11)
RDW: 14.4 % (ref 11.5–15.5)
WBC: 12.4 10*3/uL — ABNORMAL HIGH (ref 4.0–10.5)
nRBC: 0 % (ref 0.0–0.2)

## 2018-09-04 LAB — LIPASE, BLOOD: Lipase: 20 U/L (ref 11–51)

## 2018-09-04 MED ORDER — POLYETHYLENE GLYCOL 3350 17 G PO PACK: 17.0000 g | PACK | Freq: Every day | ORAL | 0 refills | Status: DC

## 2018-09-04 MED ORDER — SODIUM CHLORIDE 0.9% FLUSH
3.0000 mL | Freq: Once | INTRAVENOUS | Status: AC
  Administered 2018-09-04: 08:00:00 3 mL via INTRAVENOUS

## 2018-09-04 MED ORDER — DOCUSATE SODIUM 50 MG/5ML PO LIQD
100.0000 mg | Freq: Once | ORAL | Status: AC
  Administered 2018-09-04: 09:00:00 100 mg via ORAL
  Filled 2018-09-04: qty 10

## 2018-09-04 MED ORDER — OXYCODONE-ACETAMINOPHEN 5-325 MG PO TABS
1.0000 | ORAL_TABLET | Freq: Once | ORAL | Status: AC
  Administered 2018-09-04: 1 via ORAL
  Filled 2018-09-04: qty 1

## 2018-09-04 MED ORDER — GLYCERIN (ADULT) 2 G RE SUPP: 1.0000 | RECTAL | 0 refills | Status: DC | PRN

## 2018-09-04 MED ORDER — LIDOCAINE HCL URETHRAL/MUCOSAL 2 % EX GEL
1.0000 "application " | Freq: Once | CUTANEOUS | Status: DC
  Filled 2018-09-04: qty 10

## 2018-09-04 MED ORDER — LIDOCAINE HCL URETHRAL/MUCOSAL 2 % EX GEL
1.0000 "application " | Freq: Once | CUTANEOUS | Status: AC
  Administered 2018-09-04: 1 via TOPICAL
  Filled 2018-09-04: qty 10

## 2018-09-04 MED ORDER — SODIUM CHLORIDE 0.9 % IV BOLUS: 500.0000 mL | Freq: Once | INTRAVENOUS | Status: DC

## 2018-09-04 MED ORDER — MAGNESIUM CITRATE PO SOLN
1.0000 | Freq: Once | ORAL | Status: AC
  Administered 2018-09-04: 09:00:00 1 via ORAL
  Filled 2018-09-04: qty 296

## 2018-09-04 MED ORDER — IOPAMIDOL (ISOVUE-300) INJECTION 61%: 100.0000 mL | Freq: Once | INTRAVENOUS | Status: DC | PRN

## 2018-09-04 MED ORDER — IOHEXOL 300 MG/ML  SOLN
100.0000 mL | Freq: Once | INTRAMUSCULAR | Status: AC | PRN
  Administered 2018-09-04: 11:00:00 100 mL via INTRAVENOUS

## 2018-09-04 MED ORDER — GLYCERIN (LAXATIVE) 1.2 G RE SUPP
1.0000 | Freq: Once | RECTAL | Status: AC
  Administered 2018-09-04: 1.2 g via RECTAL

## 2018-09-04 MED ORDER — SODIUM CHLORIDE 0.9 % IV BOLUS
1000.0000 mL | Freq: Once | INTRAVENOUS | Status: AC
  Administered 2018-09-04: 09:00:00 1000 mL via INTRAVENOUS

## 2018-09-04 NOTE — ED Provider Notes (Signed)
[facility] Emergency Department Provider Note    First MD Initiated Contact with Patient 09/04/18 [phone]     (approximate)  I have reviewed the triage vital signs and the nursing notes.   HISTORY  Chief Complaint Constipation and Post-op Problem    HPI Katie Guzman is a 48 y.o. female below listed past medical history with recent admission to the hospital for osteomyelitis status post amputation currently on chronic antibiotics and pain medication presents the ER for evaluation of dehydration as well as constipation.  States that she is having difficulty moving her bowels and thinks is related to anesthesia and recently being admitted to the hospital.  Has had similar issues with this in the past.  She is been trying over-the-counter laxatives without much improvement.  Denies any blood or melena.  Denies any fevers.    Past Medical History:  Diagnosis Date  . Asthma   . Cancer ([place])    lung  . Cellulitis 04/15/2015  . COPD (chronic obstructive pulmonary disease) ([place])   . Diabetes mellitus    Poorly controlled with complications  . Emphysema   . Fibromyalgia   . Neuropathy   . Tenosynovitis of foot 04/16/2015   Family History  Problem Relation Age of Onset  . Lung cancer Mother    Past Surgical History:  Procedure Laterality Date  . AMPUTATION Right 08/19/2018   Procedure: AMPUTATION RAY;  Surgeon: Sharlotte Alamo, DPM;  Location: [facility] ORS;  Service: Podiatry;  Laterality: Right;  . AMPUTATION TOE Right 08/19/2018   Procedure: AMPUTATION TOE;  Surgeon: Sharlotte Alamo, DPM;  Location: [facility] ORS;  Service: Podiatry;  Laterality: Right;  . BACK SURGERY    . CESAREAN SECTION    . TONSILLECTOMY    . TUBAL LIGATION    . WOUND DEBRIDEMENT Right 08/19/2018   Procedure: DEBRIDEMENT WOUND RIGHT FOOT, DIABETIC;  Surgeon: Sharlotte Alamo, DPM;  Location: [facility] ORS;  Service: Podiatry;  Laterality: Right;   Patient Active Problem List   Diagnosis Date Noted  .  Cellulitis of right foot 08/18/2018  . Diabetic ulcer of right foot associated with diabetes mellitus due to underlying condition ([place]) 07/29/2018  . Diabetic foot infection ([place]) 03/27/2017  . Skin ulcer ([place]) 08/23/2016  . Diabetic foot ulcer ([place]) 08/23/2016  . Diabetic ulcer of toe of left foot associated with type 2 diabetes mellitus ([place])   . 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 Tenosynovitis of foot 04/16/2015  . Hypokalemia 04/16/2015  . Cellulitis 04/15/2015  . Diabetes mellitus ([place]) 04/15/2015  . Pain syndrome, chronic 04/15/2015  . COPD (chronic obstructive pulmonary disease) ([place]) 04/15/2015  . Diabetic neuropathy ([place]) 04/15/2015  . Diabetes mellitus with complication ([place])   . COPD exacerbation ([place]) 10/19/2014      Prior to Admission medications   Medication Sig Start Date End Date Taking? Authorizing Provider  acetaminophen (TYLENOL) 325 MG tablet Take 650 mg by mouth every 6 (six) hours as needed for mild pain.    [provider]  albuterol (PROVENTIL HFA;VENTOLIN HFA) 108 (90 BASE) MCG/ACT inhaler Inhale 2 puffs into the lungs every 4 (four) hours as needed for wheezing or shortness of breath. For wheeze or shortness of breath Patient taking differently: Inhale 2 puffs into the lungs 2 (two) times a day. For wheeze or shortness of breath 10/23/14   Gouru, Illene Silver, MD  albuterol (PROVENTIL) (2.5 MG/3ML) 0.083% nebulizer solution Take 2.5 mg by nebulization every 4 (four) hours as needed  for wheezing or shortness of breath.     [provider]  ascorbic acid (VITAMIN C) 250 MG tablet Take 1 tablet (250 mg total) by mouth 2 (two) times daily. 07/31/18   Vaughan Basta, MD  escitalopram (LEXAPRO) 10 MG tablet Take 20 mg by mouth every morning. 07/27/18 01/23/19  [provider]  fluticasone (FLONASE) 50 MCG/ACT nasal spray Place 1 spray into both nostrils daily. 07/10/16   [provider]   Fluticasone-Salmeterol (ADVAIR) 250-50 MCG/DOSE AEPB Inhale 1 puff into the lungs every 12 (twelve) hours. 10/26/16   [provider]  gabapentin (NEURONTIN) 800 MG tablet Take 1 tablet (800 mg total) by mouth 3 (three) times daily. 04/01/17   Edrick Kins, DPM  glycerin adult 2 g suppository Place 1 suppository rectally as needed for constipation. 09/04/18   Merlyn Lot, MD  ibuprofen (ADVIL) 800 MG tablet Take 800 mg by mouth every 6 (six) hours as needed for fever or mild pain.     [provider]  insulin glargine (LANTUS) 100 UNIT/ML injection Inject 0.25 mLs (25 Units total) into the skin at bedtime. 07/31/18   Vaughan Basta, MD  insulin lispro (HUMALOG) 100 UNIT/ML injection Inject 1-18 Units into the skin 4 (four) times daily -  before meals and at bedtime. Sliding scale  07/11/16 08/24/18  [provider]  levofloxacin (LEVAQUIN) 500 MG tablet Take 1 tablet (500 mg total) by mouth daily for 14 days. 08/25/18 09/08/18  Demetrios Loll, MD  lidocaine (LIDODERM) 5 % Place 1 patch onto the skin daily as needed. For pain. Remove & Discard patch within 12 hours or as directed by MD    [provider]  metroNIDAZOLE (FLAGYL) 500 MG tablet Take 1 tablet (500 mg total) by mouth 3 (three) times daily for 14 days. 08/25/18 09/08/18  Demetrios Loll, MD  nicotine (NICODERM CQ - DOSED IN MG/24 HOURS) 21 mg/24hr patch Place 1 patch (21 mg total) onto the skin daily. Patient taking differently: Place 21 mg onto the skin daily as needed (smoking cessation).  10/23/14   Gouru, Illene Silver, MD  polyethylene glycol (MIRALAX / GLYCOLAX) 17 g packet Take 17 g by mouth daily. Mix one tablespoon with 8oz of your favorite juice or water every day until you are having soft formed stools. 09/04/18   Merlyn Lot, MD  senna-docusate (SENOKOT-S) 8.6-50 MG tablet Take 1 tablet by mouth daily. 04/13/18 04/13/19  [provider]  tiotropium (SPIRIVA) 18 MCG inhalation capsule Place 1  capsule (18 mcg total) into inhaler and inhale daily. 10/23/14   Nicholes Mango, MD    Allergies Flexeril [cyclobenzaprine], Hydrocodone, Other, Darvocet [propoxyphene n-acetaminophen], Fish-derived products, Propofol, Toradol [ketorolac tromethamine], Vicodin [hydrocodone-acetaminophen], Ketorolac, and Tramadol    Social History Social History   Tobacco Use  . Smoking status: Current Some Day Smoker    Packs/day: 0.50    Years: 30.00    Pack years: 15.00    Types: Cigarettes    Last attempt to quit: 10/05/2014    Years since quitting: 3.9  . Smokeless tobacco: Never Used  Substance Use Topics  . Alcohol use: No  . Drug use: No    Review of Systems Patient denies headaches, rhinorrhea, blurry vision, numbness, shortness of breath, chest pain, edema, cough, abdominal pain, nausea, vomiting, diarrhea, dysuria, fevers, rashes or hallucinations unless otherwise stated above in HPI. ____________________________________________   PHYSICAL EXAM:  VITAL SIGNS: Vitals:   09/04/18 1031 09/04/18 1100  BP: 138/77 (!) 148/71  Pulse: 85  Resp: 16   Temp:    SpO2: 95%     Constitutional: Alert and oriented.  Eyes: Conjunctivae are normal.  Head: Atraumatic. Nose: No congestion/rhinnorhea. Mouth/Throat: Mucous membranes are moist.   Neck: No stridor. Painless ROM.  Cardiovascular: Normal rate, regular rhythm. Grossly normal heart sounds.  Good peripheral circulation. Respiratory: Normal respiratory effort.  No retractions. Lungs CTAB. Gastrointestinal: Soft and nontender. No distention. No abdominal bruits. No CVA tenderness. Genitourinary:  Musculoskeletal: No lower extremity tenderness nor edema.  LLE in surgical dressing appears c/d/i, no drainage or proximal cellulitis of foot No joint effusions. Neurologic:  Normal speech and language. No gross focal neurologic deficits are appreciated. No facial droop Skin:  Skin is warm, dry and intact. No rash noted. Psychiatric: Mood and  affect are normal. Speech and behavior are normal.  ____________________________________________   LABS (all labs ordered are listed, but only abnormal results are displayed)  Results for orders placed or performed during the hospital encounter of 09/04/18 (from the past 24 hour(s))  Lipase, blood     Status: None   Collection Time: 09/04/18  8:35 AM  Result Value Ref Range   Lipase 20 11 - 51 U/L  Comprehensive metabolic panel     Status: Abnormal   Collection Time: 09/04/18  8:35 AM  Result Value Ref Range   Sodium 136 135 - 145 mmol/L   Potassium 4.2 3.5 - 5.1 mmol/L   Chloride 94 (L) 98 - 111 mmol/L   CO2 31 22 - 32 mmol/L   Glucose, Bld 214 (H) 70 - 99 mg/dL   BUN 20 6 - 20 mg/dL   Creatinine, Ser 0.82 0.44 - 1.00 mg/dL   Calcium 9.7 8.9 - 10.3 mg/dL   Total Protein 7.8 6.5 - 8.1 g/dL   Albumin 3.9 3.5 - 5.0 g/dL   AST 13 (L) 15 - 41 U/L   ALT 10 0 - 44 U/L   Alkaline Phosphatase 95 38 - 126 U/L   Total Bilirubin 0.7 0.3 - 1.2 mg/dL   GFR calc non Af Amer >60 >60 mL/min   GFR calc Af Amer >60 >60 mL/min   Anion gap 11 5 - 15  CBC     Status: Abnormal   Collection Time: 09/04/18  8:35 AM  Result Value Ref Range   WBC 12.4 (H) 4.0 - 10.5 K/uL   RBC 4.82 3.87 - 5.11 MIL/uL   Hemoglobin 13.3 12.0 - 15.0 g/dL   HCT 42.1 36.0 - 46.0 %   MCV 87.3 80.0 - 100.0 fL   MCH 27.6 26.0 - 34.0 pg   MCHC 31.6 30.0 - 36.0 g/dL   RDW 14.4 11.5 - 15.5 %   Platelets 501 (H) 150 - 400 K/uL   nRBC 0.0 0.0 - 0.2 %   ____________________________________________ ____________________________________________  RADIOLOGY  I personally reviewed all radiographic images ordered to evaluate for the above acute complaints and reviewed radiology reports and findings.  These findings were personally discussed with the patient.  Please see medical record for radiology report.  ____________________________________________   PROCEDURES  Procedure(s) performed:  Procedures    Critical Care  performed: no ____________________________________________   INITIAL IMPRESSION / ASSESSMENT AND PLAN / ED COURSE  Pertinent labs & imaging results that were available during my care of the patient were reviewed by me and considered in my medical decision making (see chart for details).   DDX: constipation, impaction, medication effect, dehydration, doubt sepsis  Rylie Limburg is a 48 y.o.  who presents to the ED with symptoms as described above certainly concerning for constipation versus impaction.  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  Will give pain medication order imaging as well as enema.  She is nontoxic-appearing.  I suspect impaction.  Will want to rule out obstructive pattern.  Clinical Course as of Sep 04 1226  Sun Sep 04, 2018  1057 Patient still has not moved her bowels since receiving enema.  Rectal exam performed with Sonia Side nurse chaperone shows evidence of fecal impaction.  [place] of soft formed stool was removed with disimpaction.  Repeat enema given.   [PR]  1225 Discussed imaging results with patient.  Fortunately after she got back from CT she had large volume bowel movement with significant improvement in her discomfort.  Repeat abdominal exam is soft and benign.  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  No evidence of perforation via ct which was performed after disimpaction and enema.  Given significant improvement in her symptoms and resolved discomfort after disimpaction I think that is reasonable.  We discussed signs and symptoms for which she should return to the ER as well as conservative management at home.   [PR]    Clinical Course User Index [PR] Merlyn Lot, MD    The patient was evaluated in Emergency Department  today for the symptoms described in the history of present illness. He/she was evaluated in the context of the global COVID-19 pandemic, which necessitated consideration that the patient might be at risk for infection with the SARS-CoV-2 virus that causes COVID-19. Institutional protocols and algorithms that pertain to the evaluation of patients at risk for COVID-19 are in a state of rapid change based on information released by regulatory bodies including the CDC and federal and state organizations. These policies and algorithms were followed during the patient's care in the ED.  As part of my medical decision making, I reviewed the following data within the [facility] notes reviewed and incorporated, Labs reviewed, notes from prior ED visits and [place] Controlled Substance Database   ____________________________________________   FINAL CLINICAL IMPRESSION(S) / ED DIAGNOSES  Final diagnoses:  Fecal impaction in rectum ([place])      NEW MEDICATIONS STARTED DURING THIS VISIT:  New Prescriptions   GLYCERIN ADULT 2 G SUPPOSITORY    Place 1 suppository rectally as needed for constipation.   POLYETHYLENE GLYCOL (MIRALAX / GLYCOLAX) 17 G PACKET    Take 17 g by mouth daily. Mix one tablespoon with 8oz of your favorite juice or water every day until you are having soft formed stools.     Note:  This document was prepared using Dragon voice recognition software and may include unintentional dictation errors.    Merlyn Lot, MD 09/04/18 1229

## 2018-09-04 NOTE — ED Notes (Signed)
Soaps suds enema performed x2. Patient observed holding solution for approximately 5 minutes, and is encouraged to attempt bowel movement; however, patient denied the need to go at this time and stated "just let me rest awhile". Will continue to monitor.

## 2018-09-04 NOTE — Discharge Instructions (Signed)

## 2018-09-04 NOTE — ED Triage Notes (Signed)
Pt c/o abd pain with constipation for the past week, pt is post amputation of the right great toe x1 week. Pt is in NAD on arrival.

## 2018-09-04 NOTE — ED Notes (Signed)
Post soap suds enema patient is still unable to have a bowel movement nor dispel fluid from enema. Katie Cornwall, MD made aware, and instructed to attempt again. Patient agreed. Currently in progress. Will continue to monitor for care and safety

## 2018-09-04 NOTE — ED Notes (Signed)
Pt up to the bedside toilet - had a small bm earlier. Requesting to have her foot dressing changed. Order placed.

## 2018-09-04 NOTE — ED Notes (Signed)
Pt signed out ama but agreed to wait for paperwork. Pt waiting in hallway. Unable to update vitals

## 2018-09-06 ENCOUNTER — Inpatient Hospital Stay: Payer: Medicaid Other | Admitting: Infectious Diseases

## 2018-09-07 ENCOUNTER — Telehealth: Payer: Self-pay | Admitting: Licensed Clinical Social Worker

## 2018-09-07 NOTE — Telephone Encounter (Signed)
Patient missed her follow up appointment with [place] yesterday and is currently receiving antibiotic treatment at the day center. Patient will need to be seen in our office for follow up.  I have made numerous attempts to contact the patient with no response. I called  her daughter's number which is her emergency contact with no response. I called same day surgery

## 2018-09-07 NOTE — Telephone Encounter (Signed)
And they will notify us when the patient comes in Friday for the appointment at 11 am.

## 2018-09-08 ENCOUNTER — Other Ambulatory Visit: Payer: Self-pay

## 2018-09-08 ENCOUNTER — Inpatient Hospital Stay (HOSPITAL_COMMUNITY)
Admission: EM | Admit: 2018-09-08 | Discharge: 2018-09-09 | DRG: 917 | Discharge status: Against Medical Advice | Payer: Medicaid Other | Attending: Critical Care Medicine | Admitting: Critical Care Medicine

## 2018-09-08 DIAGNOSIS — Z4659 Encounter for fitting and adjustment of other gastrointestinal appliance and device: Secondary | ICD-10-CM

## 2018-09-08 DIAGNOSIS — Z801 Family history of malignant neoplasm of trachea, bronchus and lung: Secondary | ICD-10-CM

## 2018-09-08 DIAGNOSIS — J9602 Acute respiratory failure with hypercapnia: Secondary | ICD-10-CM | POA: Diagnosis present

## 2018-09-08 DIAGNOSIS — G92 Toxic encephalopathy: Secondary | ICD-10-CM | POA: Diagnosis present

## 2018-09-08 DIAGNOSIS — Z794 Long term (current) use of insulin: Secondary | ICD-10-CM

## 2018-09-08 DIAGNOSIS — Z888 Allergy status to other drugs, medicaments and biological substances status: Secondary | ICD-10-CM

## 2018-09-08 DIAGNOSIS — C321 Malignant neoplasm of supraglottis: Secondary | ICD-10-CM | POA: Diagnosis present

## 2018-09-08 DIAGNOSIS — Z923 Personal history of irradiation: Secondary | ICD-10-CM

## 2018-09-08 DIAGNOSIS — T50904A Poisoning by unspecified drugs, medicaments and biological substances, undetermined, initial encounter: Secondary | ICD-10-CM

## 2018-09-08 DIAGNOSIS — T402X1A Poisoning by other opioids, accidental (unintentional), initial encounter: Principal | ICD-10-CM | POA: Diagnosis present

## 2018-09-08 DIAGNOSIS — Z89411 Acquired absence of right great toe: Secondary | ICD-10-CM

## 2018-09-08 DIAGNOSIS — L97509 Non-pressure chronic ulcer of other part of unspecified foot with unspecified severity: Secondary | ICD-10-CM | POA: Diagnosis present

## 2018-09-08 DIAGNOSIS — E1140 Type 2 diabetes mellitus with diabetic neuropathy, unspecified: Secondary | ICD-10-CM | POA: Diagnosis present

## 2018-09-08 DIAGNOSIS — J690 Pneumonitis due to inhalation of food and vomit: Secondary | ICD-10-CM | POA: Diagnosis present

## 2018-09-08 DIAGNOSIS — Z79899 Other long term (current) drug therapy: Secondary | ICD-10-CM

## 2018-09-08 DIAGNOSIS — M869 Osteomyelitis, unspecified: Secondary | ICD-10-CM | POA: Diagnosis present

## 2018-09-08 DIAGNOSIS — IMO0001 Reserved for inherently not codable concepts without codable children: Secondary | ICD-10-CM

## 2018-09-08 DIAGNOSIS — F329 Major depressive disorder, single episode, unspecified: Secondary | ICD-10-CM | POA: Diagnosis present

## 2018-09-08 DIAGNOSIS — Z885 Allergy status to narcotic agent status: Secondary | ICD-10-CM

## 2018-09-08 DIAGNOSIS — G8929 Other chronic pain: Secondary | ICD-10-CM | POA: Diagnosis present

## 2018-09-08 DIAGNOSIS — T884XXA Failed or difficult intubation, initial encounter: Secondary | ICD-10-CM | POA: Diagnosis present

## 2018-09-08 DIAGNOSIS — Z89412 Acquired absence of left great toe: Secondary | ICD-10-CM

## 2018-09-08 DIAGNOSIS — F17210 Nicotine dependence, cigarettes, uncomplicated: Secondary | ICD-10-CM | POA: Diagnosis present

## 2018-09-08 DIAGNOSIS — Z7951 Long term (current) use of inhaled steroids: Secondary | ICD-10-CM

## 2018-09-08 DIAGNOSIS — F1110 Opioid abuse, uncomplicated: Secondary | ICD-10-CM | POA: Diagnosis present

## 2018-09-08 DIAGNOSIS — F1410 Cocaine abuse, uncomplicated: Secondary | ICD-10-CM | POA: Diagnosis present

## 2018-09-08 DIAGNOSIS — N179 Acute kidney failure, unspecified: Secondary | ICD-10-CM | POA: Diagnosis present

## 2018-09-08 DIAGNOSIS — J439 Emphysema, unspecified: Secondary | ICD-10-CM | POA: Diagnosis present

## 2018-09-08 DIAGNOSIS — Z89422 Acquired absence of other left toe(s): Secondary | ICD-10-CM

## 2018-09-08 DIAGNOSIS — G929 Unspecified toxic encephalopathy: Secondary | ICD-10-CM | POA: Diagnosis present

## 2018-09-08 DIAGNOSIS — Z85118 Personal history of other malignant neoplasm of bronchus and lung: Secondary | ICD-10-CM

## 2018-09-08 DIAGNOSIS — Z5329 Procedure and treatment not carried out because of patient's decision for other reasons: Secondary | ICD-10-CM | POA: Diagnosis not present

## 2018-09-08 DIAGNOSIS — M797 Fibromyalgia: Secondary | ICD-10-CM | POA: Diagnosis present

## 2018-09-08 DIAGNOSIS — Y92511 Restaurant or cafe as the place of occurrence of the external cause: Secondary | ICD-10-CM

## 2018-09-08 DIAGNOSIS — J189 Pneumonia, unspecified organism: Secondary | ICD-10-CM

## 2018-09-08 DIAGNOSIS — E1169 Type 2 diabetes mellitus with other specified complication: Secondary | ICD-10-CM | POA: Diagnosis present

## 2018-09-08 DIAGNOSIS — Z20828 Contact with and (suspected) exposure to other viral communicable diseases: Secondary | ICD-10-CM | POA: Diagnosis present

## 2018-09-08 DIAGNOSIS — F1210 Cannabis abuse, uncomplicated: Secondary | ICD-10-CM | POA: Diagnosis present

## 2018-09-08 DIAGNOSIS — E11621 Type 2 diabetes mellitus with foot ulcer: Secondary | ICD-10-CM | POA: Diagnosis present

## 2018-09-08 NOTE — ED Provider Notes (Signed)
[facility] EMERGENCY DEPARTMENT Provider Note   CSN: [id number] Arrival date & time: 09/08/18  2333     History   Chief Complaint Chief Complaint  Patient presents with  . Drug Overdose  Level 5 caveat due to altered mental status  HPI Katie Guzman is a 48 y.o. female.     The history is provided by the EMS personnel. The history is limited by the condition of the patient.  Drug Overdose This is a new problem. The problem occurs constantly. The problem has been gradually improving. Nothing aggravates the symptoms. Relieved by: narcan.  Patient brought in by EMS after presumed overdose.  Patient was found unresponsive in the [facility] drive-through.  She vomited once.  Patient had faint pulses and pinpoint pupils.  She was given a milligram of Narcan with improvement of respiratory drive however she has become combative no other details are known at this time  Past Medical History:  Diagnosis Date  . Asthma   . Cancer ([place])    lung  . Cellulitis 04/15/2015  . COPD (chronic obstructive pulmonary disease) ([place])   . Diabetes mellitus    Poorly controlled with complications  . Emphysema   . Fibromyalgia   . Neuropathy   . Tenosynovitis of foot 04/16/2015    Patient Active Problem List   Diagnosis Date Noted  . Cellulitis of right foot 08/18/2018  . Diabetic ulcer of right foot associated with diabetes mellitus due to underlying condition ([place]) 07/29/2018  . Diabetic foot infection ([place]) 03/27/2017  . Skin ulcer ([place]) 08/23/2016  . Diabetic foot ulcer ([place]) 08/23/2016  . Diabetic ulcer of toe of left foot associated with type 2 diabetes mellitus ([place])   . 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 Tenosynovitis of foot 04/16/2015  . Hypokalemia 04/16/2015  . Cellulitis 04/15/2015  . Diabetes mellitus ([place]) 04/15/2015  . Pain syndrome, chronic 04/15/2015  . COPD (chronic obstructive pulmonary disease) ([place]) 04/15/2015  .  Diabetic neuropathy ([place]) 04/15/2015  . Diabetes mellitus with complication ([place])   . COPD exacerbation ([place]) 10/19/2014    Past Surgical History:  Procedure Laterality Date  . AMPUTATION Right 08/19/2018   Procedure: AMPUTATION RAY;  Surgeon: Sharlotte Alamo, DPM;  Location: [facility] ORS;  Service: Podiatry;  Laterality: Right;  . AMPUTATION TOE Right 08/19/2018   Procedure: AMPUTATION TOE;  Surgeon: Sharlotte Alamo, DPM;  Location: [facility] ORS;  Service: Podiatry;  Laterality: Right;  . BACK SURGERY    . CESAREAN SECTION    . TONSILLECTOMY    . TUBAL LIGATION    . WOUND DEBRIDEMENT Right 08/19/2018   Procedure: DEBRIDEMENT WOUND RIGHT FOOT, DIABETIC;  Surgeon: Sharlotte Alamo, DPM;  Location: [facility] ORS;  Service: Podiatry;  Laterality: Right;     OB History   No obstetric history on file.      Home Medications    Prior to Admission medications   Medication Sig Start Date End Date Taking? Authorizing Provider  acetaminophen (TYLENOL) 325 MG tablet Take 650 mg by mouth every 6 (six) hours as needed for mild pain.    [provider]  albuterol (PROVENTIL HFA;VENTOLIN HFA) 108 (90 BASE) MCG/ACT inhaler Inhale 2 puffs into the lungs every 4 (four) hours as needed for wheezing or shortness of breath. For wheeze or shortness of breath Patient taking differently: Inhale 2 puffs into the lungs 2 (two) times a day. For wheeze or shortness of breath 10/23/14   Nicholes Mango, MD  albuterol (PROVENTIL) (  2.5 MG/3ML) 0.083% nebulizer solution Take 2.5 mg by nebulization every 4 (four) hours as needed for wheezing or shortness of breath.     [provider]  ascorbic acid (VITAMIN C) 250 MG tablet Take 1 tablet (250 mg total) by mouth 2 (two) times daily. 07/31/18   Vaughan Basta, MD  escitalopram (LEXAPRO) 10 MG tablet Take 20 mg by mouth every morning. 07/27/18 01/23/19  [provider]  fluticasone (FLONASE) 50 MCG/ACT nasal spray Place 1 spray into both nostrils daily. 07/10/16    [provider]  Fluticasone-Salmeterol (ADVAIR) 250-50 MCG/DOSE AEPB Inhale 1 puff into the lungs every 12 (twelve) hours. 10/26/16   [provider]  gabapentin (NEURONTIN) 800 MG tablet Take 1 tablet (800 mg total) by mouth 3 (three) times daily. 04/01/17   Edrick Kins, DPM  glycerin adult 2 g suppository Place 1 suppository rectally as needed for constipation. 09/04/18   Merlyn Lot, MD  ibuprofen (ADVIL) 800 MG tablet Take 800 mg by mouth every 6 (six) hours as needed for fever or mild pain.     [provider]  insulin glargine (LANTUS) 100 UNIT/ML injection Inject 0.25 mLs (25 Units total) into the skin at bedtime. 07/31/18   Vaughan Basta, MD  insulin lispro (HUMALOG) 100 UNIT/ML injection Inject 1-18 Units into the skin 4 (four) times daily -  before meals and at bedtime. Sliding scale  07/11/16 08/24/18  [provider]  levofloxacin (LEVAQUIN) 500 MG tablet Take 1 tablet (500 mg total) by mouth daily for 14 days. 08/25/18 09/08/18  Demetrios Loll, MD  lidocaine (LIDODERM) 5 % Place 1 patch onto the skin daily as needed. For pain. Remove & Discard patch within 12 hours or as directed by MD    [provider]  metroNIDAZOLE (FLAGYL) 500 MG tablet Take 1 tablet (500 mg total) by mouth 3 (three) times daily for 14 days. 08/25/18 09/08/18  Demetrios Loll, MD  nicotine (NICODERM CQ - DOSED IN MG/24 HOURS) 21 mg/24hr patch Place 1 patch (21 mg total) onto the skin daily. Patient taking differently: Place 21 mg onto the skin daily as needed (smoking cessation).  10/23/14   Gouru, Illene Silver, MD  polyethylene glycol (MIRALAX / GLYCOLAX) 17 g packet Take 17 g by mouth daily. Mix one tablespoon with 8oz of your favorite juice or water every day until you are having soft formed stools. 09/04/18   Merlyn Lot, MD  senna-docusate (SENOKOT-S) 8.6-50 MG tablet Take 1 tablet by mouth daily. 04/13/18 04/13/19  [provider]  tiotropium (SPIRIVA) 18 MCG  inhalation capsule Place 1 capsule (18 mcg total) into inhaler and inhale daily. 10/23/14   Nicholes Mango, MD    Family History Family History  Problem Relation Age of Onset  . Lung cancer Mother     Social History Social History   Tobacco Use  . Smoking status: Current Some Day Smoker    Packs/day: 0.50    Years: 30.00    Pack years: 15.00    Types: Cigarettes    Last attempt to quit: 10/05/2014    Years since quitting: 3.9  . Smokeless tobacco: Never Used  Substance Use Topics  . Alcohol use: No  . Drug use: No     Allergies   Flexeril [cyclobenzaprine], Hydrocodone, Other, Darvocet [propoxyphene n-acetaminophen], Fish-derived products, Propofol, Toradol [ketorolac tromethamine], Vicodin [hydrocodone-acetaminophen], Ketorolac, and Tramadol   Review of Systems Review of Systems  Unable to perform ROS: Mental status change     Physical  Exam Updated Vital Signs Pulse (!) 109   Resp 19   SpO2 98%   Physical Exam CONSTITUTIONAL: Disheveled, ill-appearing HEAD: Normocephalic/atraumatic, no visible trauma EYES: Pupils dilated equal bilaterally ENMT: Mucous membranes dry, poor dentition NECK: supple no meningeal signs CV: S1/S2 noted, no murmurs/rubs/gallops noted LUNGS: Tachypneic, respiratory distress noted ABDOMEN: soft, nontender, no rebound or guarding, bowel sounds noted throughout abdomen GU:no cva tenderness NEURO: Pt is somnolent not responding to voice or pain intermittently combative EXTREMITIES: right Foot is bandaged.,  Otherwise pulses are equal SKIN: warm, color normal PSYCH: Unable to assess   ED Treatments / Results  Labs (all labs ordered are listed, but only abnormal results are displayed) Labs Reviewed  COMPREHENSIVE METABOLIC PANEL - Abnormal; Notable for the following components:      Result Value   Chloride 96 (*)    Glucose, Bld 266 (*)    Creatinine, Ser 1.13 (*)    GFR calc non Af Amer 57 (*)    All other components within normal  limits  CBC WITH DIFFERENTIAL/PLATELET - Abnormal; Notable for the following components:   WBC 24.1 (*)    Platelets 452 (*)    Neutro Abs 22.2 (*)    Lymphs Abs 0.5 (*)    Monocytes Absolute 1.1 (*)    Abs Immature Granulocytes 0.12 (*)    All other components within normal limits  URINALYSIS, COMPLETE (UACMP) WITH MICROSCOPIC - Abnormal; Notable for the following components:   Glucose, UA >=500 (*)    All other components within normal limits  AMMONIA - Abnormal; Notable for the following components:   Ammonia 49 (*)    All other components within normal limits  RAPID URINE DRUG SCREEN, HOSP PERFORMED - Abnormal; Notable for the following components:   Opiates POSITIVE (*)    Cocaine POSITIVE (*)    Tetrahydrocannabinol POSITIVE (*)    All other components within normal limits  ACETAMINOPHEN LEVEL - Abnormal; Notable for the following components:   Acetaminophen (Tylenol), Serum <10 (*)    All other components within normal limits  CBG MONITORING, ED - Abnormal; Notable for the following components:   Glucose-Capillary 243 (*)    All other components within normal limits  POCT I-STAT 7, (LYTES, BLD GAS, ICA,H+H) - Abnormal; Notable for the following components:   pH, Arterial 7.214 (*)    pCO2 arterial 90.9 (*)    pO2, Arterial 139.0 (*)    Bicarbonate 36.7 (*)    TCO2 39 (*)    Acid-Base Excess 6.0 (*)    Sodium 134 (*)    Potassium 2.9 (*)    All other components within normal limits  POCT I-STAT 7, (LYTES, BLD GAS, ICA,H+H) - Abnormal; Notable for the following components:   pCO2 arterial 54.2 (*)    pO2, Arterial 145.0 (*)    Bicarbonate 32.3 (*)    TCO2 34 (*)    Acid-Base Excess 6.0 (*)    Potassium 3.2 (*)    All other components within normal limits  SARS CORONAVIRUS 2 (HOSPITAL ORDER, [place] LAB)  CULTURE, BLOOD (ROUTINE X 2)  CULTURE, BLOOD (ROUTINE X 2)  URINE CULTURE  ETHANOL  SALICYLATE LEVEL  LACTIC ACID, PLASMA  LACTIC  ACID, PLASMA  I-STAT BETA HCG BLOOD, ED ([facility], [facility], [facility] ONLY)  CBG MONITORING, ED    EKG EKG Interpretation  Date/Time:  Thursday September 08 2018 23:48:31 EDT Ventricular Rate:  107 PR Interval:    QRS Duration: 96 QT Interval:  323 QTC Calculation: 431 R Axis:   76 Text Interpretation:  Sinus tachycardia Probable left atrial enlargement Abnormal R-wave progression, late transition Interpretation limited secondary to artifact Confirmed by Ripley Fraise [phone]) on 09/08/2018 11:50:39 PM   Radiology Dg Chest Portable 1 View  Result Date: 09/09/2018 CLINICAL DATA:  Post intubation EXAM: PORTABLE CHEST 1 VIEW COMPARISON:  07/28/2018 FINDINGS: Endotracheal tube terminates 2.7 cm above the carina. Mild patchy lingular/left lower lobe opacities, suspicious for pneumonia/aspiration. Mild right infrahilar opacity. Nodular opacity at the lateral right lower hemithorax likely reflects a nipple shadow or overlying chest lead. The heart is normal in size. IMPRESSION: Endotracheal tube terminates 2.7 cm above the carina. Mild patchy lingular/left lower lobe opacities, suspicious for pneumonia/aspiration. Electronically Signed   By: Julian Hy M.D.   On: 09/09/2018 01:15   Dg Abd Portable 1v  Result Date: 09/09/2018 CLINICAL DATA:  NG tube placement EXAM: PORTABLE ABDOMEN - 1 VIEW COMPARISON:  CT 09/04/2018, 09/04/2018 radiograph FINDINGS: Right infrahilar atelectasis or infiltrate. Esophageal tube tip overlies the gastric body. IMPRESSION: Esophageal tube tip overlies gastric body Electronically Signed   By: Donavan Foil M.D.   On: 09/09/2018 01:43    Procedures .Critical Care Performed by: Ripley Fraise, MD Authorized by: Ripley Fraise, MD   Critical care provider statement:    Critical care time (minutes):  33   Critical care start time:  09/08/2018 11:55 PM   Critical care end time:  09/09/2018 1:00 AM   Critical care time was exclusive of:  Separately billable procedures and  treating other patients   Critical care was necessary to treat or prevent imminent or life-threatening deterioration of the following conditions:  CNS failure or compromise, respiratory failure, cardiac failure and sepsis   Critical care was time spent personally by me on the following activities:  Examination of patient, evaluation of patient's response to treatment, discussions with primary provider, pulse oximetry, re-evaluation of patient's condition, ordering and review of radiographic studies, ordering and review of laboratory studies, discussions with consultants and review of old charts   I assumed direction of critical care for this patient from another provider in my specialty: no    Procedure Name: Intubation Date/Time: 09/09/2018 1:00 AM Performed by: Ripley Fraise, MD Pre-anesthesia Checklist: Patient identified Oxygen Delivery Method: Non-rebreather mask Preoxygenation: Pre-oxygenation with 100% oxygen Induction Type: Rapid sequence Laryngoscope Size: Glidescope and 3 Tube size: 7.0 mm Number of attempts: 1 Placement Confirmation: ETT inserted through vocal cords under direct vision,  CO2 detector and Breath sounds checked- equal and bilateral Secured at: 23 cm Tube secured with: ETT holder Difficulty Due To: Difficulty was anticipated Future Recommendations: Recommend- induction with short-acting agent, and alternative techniques readily available      Medications Ordered in ED Medications  propofol (DIPRIVAN) 1000 MG/100ML infusion (0 mcg/kg/min  49.9 kg Intravenous Stopped 09/09/18 0114)  fentaNYL (SUBLIMAZE) injection 50 mcg (has no administration in time range)  fentaNYL 2528mcg in NS 214mL (82mcg/ml) infusion-PREMIX (100 mcg/hr Intravenous New Bag/Given 09/09/18 0115)  midazolam (VERSED) injection 2 mg (2 mg Intravenous Given 09/09/18 0115)  midazolam (VERSED) injection 2 mg (has no administration in time range)  fentaNYL (SUBLIMAZE) 100 MCG/2ML injection (has no  administration in time range)  fentaNYL (SUBLIMAZE) bolus via infusion 50 mcg (50 mcg Intravenous Bolus from Bag 09/09/18 0116)  cefTRIAXone (ROCEPHIN) 2 g in sodium chloride 0.9 % 100 mL IVPB (has no administration in time range)  azithromycin (ZITHROMAX) 500 mg in sodium chloride 0.9 % 250 mL IVPB (  has no administration in time range)  sodium chloride 0.9 % bolus 1,000 mL (has no administration in time range)    And  sodium chloride 0.9 % bolus 500 mL (has no administration in time range)  etomidate (AMIDATE) injection (20 mg Intravenous Given 09/09/18 0035)  succinylcholine (ANECTINE) injection (150 mg Intravenous Given 09/09/18 0035)  fentaNYL (SUBLIMAZE) injection 100 mcg (100 mcg Intravenous Given 09/09/18 0109)  midazolam (VERSED) injection 2 mg (2 mg Intravenous Given 09/09/18 0109)  ondansetron (ZOFRAN) injection 4 mg (4 mg Intravenous Given 09/09/18 0118)  sodium chloride 0.9 % bolus 1,000 mL (1,000 mLs Intravenous New Bag/Given 09/09/18 0127)     Initial Impression / Assessment and Plan / ED Course  I have reviewed the triage vital signs and the nursing notes.  Pertinent labs & imaging results that were available during my care of the patient were reviewed by me and considered in my medical decision making (see chart for details).        11:55 PM Patient presents for presumed overdose.  Patient was found unresponsive in a [employer] drive thru She received Narcan with some improvement of respiratory drive, but she is still combative.  She has a history of COPD, concern that she is now has hypercapnic respiratory failure.  Stat ABG is pending 12:24 AM ABG confirms hypercapnia.  She is not hypoxic, but is still somnolent.  Daughter is at bedside.  She reports patient has been very ill recently with lung cancer as well as laryngeal cancer.  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  Daughter would like to speak to her sister first prior to proceeding.   When daughter returned to the room she began yelling and screaming.  Reporting "you are killing her " Patient was on nonrebreather, vitals were stable consistent with prior.  I did ask patients daughter to leave the room.  Security was called.  Daughter then began going through her bag and clothing to look for her mother's money .  She was then escorted out with security.  Patient was then intubated.  This was a difficult intubation as she did have a mass noted which distorted the airway.  Patient was intubated successfully.  No hypoxia.  X-ray shows appropriate placement  1:49 AM I have spoken to critical care here at [facility] for admission.  Family is requesting transfer to [facility]  I have [facility] a call to the [facility] transfer line, but they report that there were no immediate beds.  She will be admitted to [facility]  Code sepsis has also been called as patient with elevated white count and evidence of pneumonia.  Final Clinical Impressions(s) / ED Diagnoses   Final diagnoses:  Drug overdose, undetermined intent, initial encounter  Acute respiratory failure with hypercapnia ([place])  Community acquired pneumonia of left lower lobe of lung [facility])    ED Discharge Orders    None       Ripley Fraise, MD 09/09/18 0151

## 2018-09-08 NOTE — ED Notes (Signed)
RT called for STAT ABG

## 2018-09-08 NOTE — ED Triage Notes (Signed)
Pt. BIB [facility] after overdose during [facility] drive thru. Emesis x1. EMS arrival faint pulses. Pt bagged. Pinpoint pupils   1 mg Narcan given IV with improvement of respiratory drive.   Pt combative.   [facility] VS BP 197/89 RR 23 PR 110 SpO2  on NR

## 2018-09-09 ENCOUNTER — Emergency Department (HOSPITAL_COMMUNITY): Payer: Medicaid Other

## 2018-09-09 ENCOUNTER — Ambulatory Visit: Admission: RE | Admit: 2018-09-09 | Payer: Medicaid Other | Source: Ambulatory Visit

## 2018-09-09 ENCOUNTER — Telehealth: Payer: Self-pay | Admitting: Licensed Clinical Social Worker

## 2018-09-09 DIAGNOSIS — J9602 Acute respiratory failure with hypercapnia: Secondary | ICD-10-CM | POA: Insufficient documentation

## 2018-09-09 DIAGNOSIS — M869 Osteomyelitis, unspecified: Secondary | ICD-10-CM | POA: Diagnosis present

## 2018-09-09 DIAGNOSIS — E119 Type 2 diabetes mellitus without complications: Secondary | ICD-10-CM

## 2018-09-09 DIAGNOSIS — F1110 Opioid abuse, uncomplicated: Secondary | ICD-10-CM | POA: Diagnosis present

## 2018-09-09 DIAGNOSIS — G92 Toxic encephalopathy: Secondary | ICD-10-CM

## 2018-09-09 DIAGNOSIS — N179 Acute kidney failure, unspecified: Secondary | ICD-10-CM | POA: Diagnosis present

## 2018-09-09 DIAGNOSIS — E1169 Type 2 diabetes mellitus with other specified complication: Secondary | ICD-10-CM | POA: Diagnosis present

## 2018-09-09 DIAGNOSIS — T17908A Unspecified foreign body in respiratory tract, part unspecified causing other injury, initial encounter: Secondary | ICD-10-CM | POA: Diagnosis not present

## 2018-09-09 DIAGNOSIS — J439 Emphysema, unspecified: Secondary | ICD-10-CM | POA: Diagnosis present

## 2018-09-09 DIAGNOSIS — C321 Malignant neoplasm of supraglottis: Secondary | ICD-10-CM | POA: Diagnosis present

## 2018-09-09 DIAGNOSIS — Z79899 Other long term (current) drug therapy: Secondary | ICD-10-CM | POA: Diagnosis not present

## 2018-09-09 DIAGNOSIS — J690 Pneumonitis due to inhalation of food and vomit: Secondary | ICD-10-CM | POA: Diagnosis present

## 2018-09-09 DIAGNOSIS — Z923 Personal history of irradiation: Secondary | ICD-10-CM | POA: Diagnosis not present

## 2018-09-09 DIAGNOSIS — Z20828 Contact with and (suspected) exposure to other viral communicable diseases: Secondary | ICD-10-CM | POA: Diagnosis present

## 2018-09-09 DIAGNOSIS — Z7951 Long term (current) use of inhaled steroids: Secondary | ICD-10-CM | POA: Diagnosis not present

## 2018-09-09 DIAGNOSIS — L97509 Non-pressure chronic ulcer of other part of unspecified foot with unspecified severity: Secondary | ICD-10-CM | POA: Diagnosis present

## 2018-09-09 DIAGNOSIS — Z794 Long term (current) use of insulin: Secondary | ICD-10-CM | POA: Diagnosis not present

## 2018-09-09 DIAGNOSIS — G8929 Other chronic pain: Secondary | ICD-10-CM | POA: Diagnosis present

## 2018-09-09 DIAGNOSIS — F1910 Other psychoactive substance abuse, uncomplicated: Secondary | ICD-10-CM

## 2018-09-09 DIAGNOSIS — G929 Unspecified toxic encephalopathy: Secondary | ICD-10-CM | POA: Diagnosis present

## 2018-09-09 DIAGNOSIS — F329 Major depressive disorder, single episode, unspecified: Secondary | ICD-10-CM | POA: Diagnosis present

## 2018-09-09 DIAGNOSIS — M86171 Other acute osteomyelitis, right ankle and foot: Secondary | ICD-10-CM | POA: Diagnosis not present

## 2018-09-09 DIAGNOSIS — Z5329 Procedure and treatment not carried out because of patient's decision for other reasons: Secondary | ICD-10-CM | POA: Diagnosis not present

## 2018-09-09 DIAGNOSIS — F17210 Nicotine dependence, cigarettes, uncomplicated: Secondary | ICD-10-CM | POA: Diagnosis present

## 2018-09-09 DIAGNOSIS — F1410 Cocaine abuse, uncomplicated: Secondary | ICD-10-CM | POA: Diagnosis present

## 2018-09-09 DIAGNOSIS — E1140 Type 2 diabetes mellitus with diabetic neuropathy, unspecified: Secondary | ICD-10-CM | POA: Diagnosis present

## 2018-09-09 DIAGNOSIS — F1210 Cannabis abuse, uncomplicated: Secondary | ICD-10-CM | POA: Diagnosis present

## 2018-09-09 DIAGNOSIS — T402X1A Poisoning by other opioids, accidental (unintentional), initial encounter: Secondary | ICD-10-CM | POA: Diagnosis present

## 2018-09-09 DIAGNOSIS — Y92511 Restaurant or cafe as the place of occurrence of the external cause: Secondary | ICD-10-CM | POA: Diagnosis not present

## 2018-09-09 DIAGNOSIS — E11621 Type 2 diabetes mellitus with foot ulcer: Secondary | ICD-10-CM | POA: Diagnosis present

## 2018-09-09 LAB — ETHANOL: Alcohol, Ethyl (B): <10 mg/dL (ref <10)

## 2018-09-09 LAB — HEMOGLOBIN A1C
Hgb A1c MFr Bld: 9.3 % — ABNORMAL HIGH (ref 4.8–5.6)
Mean Plasma Glucose: 220.21 mg/dL

## 2018-09-09 LAB — POCT I-STAT 7, (LYTES, BLD GAS, ICA,H+H)
Acid-Base Excess: 6 mmol/L — ABNORMAL HIGH (ref 0.0–2.0)
Acid-Base Excess: 6 mmol/L — ABNORMAL HIGH (ref 0.0–2.0)
Bicarbonate: 32.3 mmol/L — ABNORMAL HIGH (ref 20.0–28.0)
Bicarbonate: 36.7 mmol/L — ABNORMAL HIGH (ref 20.0–28.0)
Calcium, Ion: 1.16 mmol/L (ref 1.15–1.40)
Calcium, Ion: 1.28 mmol/L (ref 1.15–1.40)
HCT: 37 % (ref 36.0–46.0)
HCT: 40 % (ref 36.0–46.0)
Hemoglobin: 12.6 g/dL (ref 12.0–15.0)
Hemoglobin: 13.6 g/dL (ref 12.0–15.0)
O2 Saturation: 98 %
O2 Saturation: 99 %
Patient temperature: 97.1
Patient temperature: 98.6
Potassium: 2.9 mmol/L — ABNORMAL LOW (ref 3.5–5.1)
Potassium: 3.2 mmol/L — ABNORMAL LOW (ref 3.5–5.1)
Sodium: 134 mmol/L — ABNORMAL LOW (ref 135–145)
Sodium: 135 mmol/L (ref 135–145)
TCO2: 34 mmol/L — ABNORMAL HIGH (ref 22–32)
TCO2: 39 mmol/L — ABNORMAL HIGH (ref 22–32)
pCO2 arterial: 54.2 mmHg — ABNORMAL HIGH (ref 32.0–48.0)
pCO2 arterial: 90.9 mmHg (ref 32.0–48.0)
pH, Arterial: 7.214 — ABNORMAL LOW (ref 7.350–7.450)
pH, Arterial: 7.38 (ref 7.350–7.450)
pO2, Arterial: 139 mmHg — ABNORMAL HIGH (ref 83.0–108.0)
pO2, Arterial: 145 mmHg — ABNORMAL HIGH (ref 83.0–108.0)

## 2018-09-09 LAB — URINALYSIS, COMPLETE (UACMP) WITH MICROSCOPIC
Bacteria, UA: NONE SEEN
Bilirubin Urine: NEGATIVE
Glucose, UA: >=500 mg/dL — AB
Hgb urine dipstick: NEGATIVE
Ketones, ur: NEGATIVE mg/dL
Leukocytes,Ua: NEGATIVE
Nitrite: NEGATIVE
Protein, ur: NEGATIVE mg/dL
Specific Gravity, Urine: 1.011 (ref 1.005–1.030)
pH: 6 (ref 5.0–8.0)

## 2018-09-09 LAB — LACTIC ACID, PLASMA
Lactic Acid, Venous: 1.2 mmol/L (ref 0.5–1.9)
Lactic Acid, Venous: 2.1 mmol/L (ref 0.5–1.9)

## 2018-09-09 LAB — CBC WITH DIFFERENTIAL/PLATELET
Abs Immature Granulocytes: 0.12 10*3/uL — ABNORMAL HIGH (ref 0.00–0.07)
Basophils Absolute: 0.1 10*3/uL (ref 0.0–0.1)
Basophils Relative: 0 %
Eosinophils Absolute: 0.1 10*3/uL (ref 0.0–0.5)
Eosinophils Relative: 0 %
HCT: 41.4 % (ref 36.0–46.0)
Hemoglobin: 12.7 g/dL (ref 12.0–15.0)
Immature Granulocytes: 1 %
Lymphocytes Relative: 2 %
Lymphs Abs: 0.5 10*3/uL — ABNORMAL LOW (ref 0.7–4.0)
MCH: 28.1 pg (ref 26.0–34.0)
MCHC: 30.7 g/dL (ref 30.0–36.0)
MCV: 91.6 fL (ref 80.0–100.0)
Monocytes Absolute: 1.1 10*3/uL — ABNORMAL HIGH (ref 0.1–1.0)
Monocytes Relative: 4 %
Neutro Abs: 22.2 10*3/uL — ABNORMAL HIGH (ref 1.7–7.7)
Neutrophils Relative %: 93 %
Platelets: 452 10*3/uL — ABNORMAL HIGH (ref 150–400)
RBC: 4.52 MIL/uL (ref 3.87–5.11)
RDW: 14.6 % (ref 11.5–15.5)
WBC: 24.1 10*3/uL — ABNORMAL HIGH (ref 4.0–10.5)
nRBC: 0 % (ref 0.0–0.2)

## 2018-09-09 LAB — COMPREHENSIVE METABOLIC PANEL
ALT: 13 U/L (ref 0–44)
AST: 22 U/L (ref 15–41)
Albumin: 3.7 g/dL (ref 3.5–5.0)
Alkaline Phosphatase: 87 U/L (ref 38–126)
Anion gap: 10 (ref 5–15)
BUN: 17 mg/dL (ref 6–20)
CO2: 32 mmol/L (ref 22–32)
Calcium: 9 mg/dL (ref 8.9–10.3)
Chloride: 96 mmol/L — ABNORMAL LOW (ref 98–111)
Creatinine, Ser: 1.13 mg/dL — ABNORMAL HIGH (ref 0.44–1.00)
GFR calc Af Amer: >60 mL/min (ref >60)
GFR calc non Af Amer: 57 mL/min — ABNORMAL LOW (ref >60)
Glucose, Bld: 266 mg/dL — ABNORMAL HIGH (ref 70–99)
Potassium: 3.6 mmol/L (ref 3.5–5.1)
Sodium: 138 mmol/L (ref 135–145)
Total Bilirubin: 0.6 mg/dL (ref 0.3–1.2)
Total Protein: 7 g/dL (ref 6.5–8.1)

## 2018-09-09 LAB — I-STAT BETA HCG BLOOD, ED (MC, WL, AP ONLY): I-stat hCG, quantitative: <5 m[IU]/mL (ref <5)

## 2018-09-09 LAB — CBG MONITORING, ED: Glucose-Capillary: 243 mg/dL — ABNORMAL HIGH (ref 70–99)

## 2018-09-09 LAB — RAPID URINE DRUG SCREEN, HOSP PERFORMED
Amphetamines: NOT DETECTED
Barbiturates: NOT DETECTED
Benzodiazepines: NOT DETECTED
Cocaine: POSITIVE — AB
Opiates: POSITIVE — AB
Tetrahydrocannabinol: POSITIVE — AB

## 2018-09-09 LAB — SARS CORONAVIRUS 2 BY RT PCR (HOSPITAL ORDER, PERFORMED IN ~~LOC~~ HOSPITAL LAB): SARS Coronavirus 2: NEGATIVE

## 2018-09-09 LAB — GLUCOSE, CAPILLARY
Glucose-Capillary: 216 mg/dL — ABNORMAL HIGH (ref 70–99)
Glucose-Capillary: 141 mg/dL — ABNORMAL HIGH (ref 70–99)
Glucose-Capillary: 146 mg/dL — ABNORMAL HIGH (ref 70–99)

## 2018-09-09 LAB — ACETAMINOPHEN LEVEL: Acetaminophen (Tylenol), Serum: <10 ug/mL — ABNORMAL LOW (ref 10–30)

## 2018-09-09 LAB — SALICYLATE LEVEL: Salicylate Lvl: <7 mg/dL (ref 2.8–30.0)

## 2018-09-09 LAB — AMMONIA: Ammonia: 49 umol/L — ABNORMAL HIGH (ref 9–35)

## 2018-09-09 MED ORDER — SODIUM CHLORIDE 0.9 % IV SOLN
INTRAVENOUS | Status: DC | PRN
  Administered 2018-09-09: 500 mL via INTRAVENOUS

## 2018-09-09 MED ORDER — MIDAZOLAM HCL 2 MG/2ML IJ SOLN
2.0000 mg | INTRAMUSCULAR | Status: DC | PRN
  Administered 2018-09-09: 2 mg via INTRAVENOUS

## 2018-09-09 MED ORDER — FENTANYL CITRATE (PF) 100 MCG/2ML IJ SOLN
INTRAMUSCULAR | Status: AC
  Filled 2018-09-09: qty 2

## 2018-09-09 MED ORDER — MIDAZOLAM HCL 2 MG/2ML IJ SOLN
2.0000 mg | Freq: Once | INTRAMUSCULAR | Status: AC
  Administered 2018-09-09: 2 mg via INTRAVENOUS

## 2018-09-09 MED ORDER — CLONAZEPAM 0.5 MG PO TBDP: 0.5000 mg | ORAL_TABLET | Freq: Two times a day (BID) | ORAL | Status: DC

## 2018-09-09 MED ORDER — FENTANYL CITRATE (PF) 100 MCG/2ML IJ SOLN: 50.0000 ug | INTRAMUSCULAR | Status: DC | PRN

## 2018-09-09 MED ORDER — SODIUM CHLORIDE 0.9 % IV BOLUS (SEPSIS)
500.0000 mL | Freq: Once | INTRAVENOUS | Status: AC
  Administered 2018-09-09: 500 mL via INTRAVENOUS

## 2018-09-09 MED ORDER — SODIUM CHLORIDE 0.9 % IV SOLN
500.0000 mg | INTRAVENOUS | Status: DC
  Filled 2018-09-09: qty 500

## 2018-09-09 MED ORDER — INSULIN ASPART 100 UNIT/ML ~~LOC~~ SOLN
0.0000 [IU] | SUBCUTANEOUS | Status: DC
  Administered 2018-09-09 (×2): 1 [IU] via SUBCUTANEOUS
  Administered 2018-09-09: 3 [IU] via SUBCUTANEOUS

## 2018-09-09 MED ORDER — HEPARIN SODIUM (PORCINE) 5000 UNIT/ML IJ SOLN: 5000.0000 [IU] | Freq: Three times a day (TID) | INTRAMUSCULAR | Status: DC

## 2018-09-09 MED ORDER — SODIUM CHLORIDE 0.9 % IV SOLN
2.0000 g | INTRAVENOUS | Status: DC
  Administered 2018-09-09: 2 g via INTRAVENOUS
  Filled 2018-09-09: qty 20

## 2018-09-09 MED ORDER — SUCCINYLCHOLINE CHLORIDE 20 MG/ML IJ SOLN
INTRAMUSCULAR | Status: AC | PRN
  Administered 2018-09-09: 150 mg via INTRAVENOUS

## 2018-09-09 MED ORDER — QUETIAPINE FUMARATE 50 MG PO TABS: 50.0000 mg | ORAL_TABLET | Freq: Two times a day (BID) | ORAL | Status: DC

## 2018-09-09 MED ORDER — FENTANYL CITRATE (PF) 100 MCG/2ML IJ SOLN: 50.0000 ug | Freq: Once | INTRAMUSCULAR | Status: DC

## 2018-09-09 MED ORDER — ETOMIDATE 2 MG/ML IV SOLN
INTRAVENOUS | Status: AC | PRN
  Administered 2018-09-09: 20 mg via INTRAVENOUS

## 2018-09-09 MED ORDER — CHLORHEXIDINE GLUCONATE 0.12% ORAL RINSE (MEDLINE KIT)
15.0000 mL | Freq: Two times a day (BID) | OROMUCOSAL | Status: DC
  Administered 2018-09-09: 15 mL via OROMUCOSAL

## 2018-09-09 MED ORDER — LACTATED RINGERS IV SOLN
INTRAVENOUS | Status: DC
  Administered 2018-09-09: 04:00:00 via INTRAVENOUS

## 2018-09-09 MED ORDER — FENTANYL BOLUS VIA INFUSION
50.0000 ug | INTRAVENOUS | Status: DC | PRN
  Administered 2018-09-09 (×2): 50 ug via INTRAVENOUS
  Filled 2018-09-09: qty 50

## 2018-09-09 MED ORDER — SODIUM CHLORIDE 0.9 % IV BOLUS
1000.0000 mL | Freq: Once | INTRAVENOUS | Status: AC
  Administered 2018-09-09: 1000 mL via INTRAVENOUS

## 2018-09-09 MED ORDER — ONDANSETRON HCL 4 MG/2ML IJ SOLN
4.0000 mg | Freq: Once | INTRAMUSCULAR | Status: AC
  Administered 2018-09-09: 4 mg via INTRAVENOUS
  Filled 2018-09-09: qty 2

## 2018-09-09 MED ORDER — ORAL CARE MOUTH RINSE
15.0000 mL | OROMUCOSAL | Status: DC
  Administered 2018-09-09 (×4): 15 mL via OROMUCOSAL

## 2018-09-09 MED ORDER — MIDAZOLAM HCL 2 MG/2ML IJ SOLN
2.0000 mg | INTRAMUSCULAR | Status: DC | PRN
  Administered 2018-09-09 (×2): 2 mg via INTRAVENOUS
  Filled 2018-09-09 (×3): qty 2

## 2018-09-09 MED ORDER — SODIUM CHLORIDE 0.9 % IV BOLUS (SEPSIS): 1000.0000 mL | Freq: Once | INTRAVENOUS | Status: DC

## 2018-09-09 MED ORDER — FENTANYL 2500MCG IN NS 250ML (10MCG/ML) PREMIX INFUSION
25.0000 ug/h | INTRAVENOUS | Status: DC
  Administered 2018-09-09: 100 ug/h via INTRAVENOUS
  Administered 2018-09-09: 200 ug/h via INTRAVENOUS
  Filled 2018-09-09: qty 250

## 2018-09-09 MED ORDER — SODIUM CHLORIDE 0.9 % IV SOLN
3.0000 g | Freq: Four times a day (QID) | INTRAVENOUS | Status: DC
  Administered 2018-09-09 (×2): 3 g via INTRAVENOUS
  Filled 2018-09-09 (×2): qty 8

## 2018-09-09 MED ORDER — DEXMEDETOMIDINE HCL IN NACL 400 MCG/100ML IV SOLN
0.4000 ug/kg/h | INTRAVENOUS | Status: DC
  Administered 2018-09-09: 0.5 ug/kg/h via INTRAVENOUS
  Filled 2018-09-09: qty 100

## 2018-09-09 MED ORDER — FENTANYL CITRATE (PF) 100 MCG/2ML IJ SOLN
100.0000 ug | Freq: Once | INTRAMUSCULAR | Status: AC
  Administered 2018-09-09: 100 ug via INTRAVENOUS

## 2018-09-09 MED ORDER — IPRATROPIUM-ALBUTEROL 0.5-2.5 (3) MG/3ML IN SOLN
3.0000 mL | Freq: Four times a day (QID) | RESPIRATORY_TRACT | Status: DC
  Administered 2018-09-09 (×3): 3 mL via RESPIRATORY_TRACT
  Filled 2018-09-09 (×2): qty 3

## 2018-09-09 MED ORDER — MIDAZOLAM HCL 2 MG/2ML IJ SOLN
INTRAMUSCULAR | Status: AC
  Administered 2018-09-09: 2 mg via INTRAVENOUS
  Filled 2018-09-09: qty 2

## 2018-09-09 MED ORDER — POLYETHYLENE GLYCOL 3350 17 G PO PACK: 17.0000 g | PACK | Freq: Every day | ORAL | Status: DC

## 2018-09-09 MED ORDER — PROPOFOL 1000 MG/100ML IV EMUL
5.0000 ug/kg/min | INTRAVENOUS | Status: DC
  Administered 2018-09-09: 01:00:00 5 ug/kg/min via INTRAVENOUS
  Filled 2018-09-09: qty 100

## 2018-09-09 MED ORDER — PANTOPRAZOLE SODIUM 40 MG IV SOLR
40.0000 mg | Freq: Every day | INTRAVENOUS | Status: DC
  Administered 2018-09-09: 40 mg via INTRAVENOUS
  Filled 2018-09-09: qty 40

## 2018-09-09 MED ORDER — ALBUTEROL SULFATE (2.5 MG/3ML) 0.083% IN NEBU: 2.5000 mg | INHALATION_SOLUTION | RESPIRATORY_TRACT | Status: DC | PRN

## 2018-09-09 NOTE — Progress Notes (Signed)
NAME:  Katie Guzman, MRN:  [id number], DOB:  Nov 01, 1970, LOS: 0 ADMISSION DATE:  09/08/2018, CONSULTATION DATE:  09/09/2018 REFERRING MD:  Dr. Christy Gentles, CHIEF COMPLAINT:  AMS/ resp failure  Brief History   48 year old female currently under outpatient treatment for right foot osteomyelitis and supraglottic/ laryngeal cancer at [facility] with radiation found unresponsive in [employer] drive thru with suspected OD.  Given narcan with improvement but then combative and vomited.  Required intubation in ER for ongoing hypercarbic respiratory failure found to have probable aspiration pneumonia and AKI.  Family wanting transfer to [facility]- no beds currently, therefore admitted to [facility].   History of present illness   HPI obtained from medical chart review as patient is intubated and sedated.    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    Patient reportedly found unresponsive at [facility] drive thru.  On EMS arrival, found with pinpoint pupils, required BVM and faint pulses given Narcan 1 mg with improvement in respiratory drive and then became combative and vomited.  On arrival to ER, patient remained somnolent and hypercarbic by ABG- 7.214/ 90.9/ 139/ 36.7.  Daughter at bedside in ER, wanted to discuss intubation with her sister prior to proceeding but returned yelling and screaming going through patient's belongings and then escorted out.  Patient then intubated for airway protection.  Noted to be a difficult intubation with a mass noted.  Labs noted for glucose 266, sCr 1.13 (baseline 0.82), WBC 24.1 (prior 12.4), HCG quant neg, neg ETOH/ salicylate/ acetaminophen levels, UDS positive for cocaine, opiates, and THC.  COVID negative.  CXR showing mild patchy lingular/  left lower lobe opacities.  Empirically started on ceftriaxone and azithro.  Family in ER requested transfer to [facility].  [facility] contacted by EDP, accepted, however no beds available at this time, therefore patient will be admitted here till bed available to PCCM.    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 at [facility] and lung cancer, ongoing tobacco abuse, COPD, chronic pain, polysubstance abuse, depression, DM, diabetic foot ulcer,   Significant Hospital Events   7/31 Admit  Consults:  [place] called for transfer per family request in ED, no beds  Procedures:  7/30 ETT >>7/31  Significant Diagnostic Tests:    Micro Data:  7/30 SARS CoV-2 >> negative 7/30 UC >> 7/30 BCx 2 >>  Antimicrobials:  7/24 oritavancin   7/31 azithro in the ER only 7/31 ceftriaxone in the ER only 7/31 unasyn >>  Interim history/subjective:  No events overnight, no new complaints  Objective   Blood pressure 113/69, pulse 100, temperature (!) 102.1 F (38.9 C), temperature source Oral, resp. rate 16, height 5\' 2"  (1.575 m), weight 57 kg, SpO2 95 %.    Vent Mode: PRVC FiO2 (%):  [40 %-100 %] 40 % Set Rate:  [16 bmp-20 bmp] 16 bmp Vt Set:  [400 mL] 400 mL PEEP:  [5 cmH20] 5 cmH20 Plateau Pressure:  [16 cmH20-22 cmH20] 16 cmH20   Intake/Output Summary (Last 24 hours) at 09/09/2018 1052 Last data filed at 09/09/2018 0500 Gross per 24 hour  Intake 1440.1 ml  Output -  Net 1440.1 ml   Filed Weights   09/09/18 0046 09/09/18  0500  Weight: 49.9 kg 57 kg   Examination: General:  Older appearing female, NAD HEENT: [place]/AT, PERRL, EOM-I and MMM Neuro: Alert and interactive, moving all ext to command CV: RRR, Nl S1/S2 and -M/R/G PULM:  Coarse BS diffusely GI: Soft, NT, ND and +BS Extremities: warm/dry, no edema, right foot with approximated wound without redness / warmth/ or drainage with amputation of  great toe, left foot with prior left great and 2nd toe amputation Skin: no rashes   I reviewed CXR myself, ETT is in a good position, discussed with [facility] Problem list     Assessment & Plan:   Acute hypercarbic respiratory failure related to suspected drug overdose COPD- not AE Difficult airway- reported supraglottic mass on ER intubation with very distorted view- hx of laryngeal/ supraglottic carcinoma tx with radiation only at [facility] given ongoing foot infection- chemo deferred Ongoing tobacco abuse P:  Extubate Titrate O2 for sat of 88-92% VAP bundle Duonebs q 6 and prn See below  Check cuff leak  Toxic encephalopathy from suspected drug OD Polysubstance abuse, depression, chronic pain -seen at [facility] pain medicine - UDS positive for opiates, cocaine, THC P:  PAD Protocol with fentanyl/ precedex for RASS goal -1/-2 Monitor QTc  -currently 431  AKI P:  KVO IVF Trend BMP / mag/ phos/ urinary output Replace electrolytes as indicated Avoid nephrotoxic agents, ensure adequate renal perfusion  DMT2- insulin dependent - HA1c in June 2020 9.5 P:  CBG q 4 SSI sensitive  Leukocytosis - aspiration pna and ongoing outpt tx for osteomyelitis P:  Follow cultures  Trend WBC/ fever curve Continue with unasyn  Call ID for osteo  Recent right foot osteomyelitis s/p amputation on 7/10 P:  Outpatient abx regimen supposed to be oritvancin 1200mg  once a week for 2 weeks then based on assessment (last dose 7/24) and Levaquin 500 mg daily and flagyl 500 mg TID for 2 weeks- unclear if patient taking, missed last outpatient follow-up appt  hx of laryngeal/ supraglottic carcinoma - treated at [facility], radiation only, chemo not given due to infection and uncontrolled DM - care everywhere notes indicate recent weight loss due to odynophagia, teeth breaking when chewing and xerostomia, was on marinol but due to cost not taking? P:  Defer to [facility] at this point  Transfer  to tele and to [facility] service with PCCM off 8/1.  Best practice:  Diet: NPO Pain/Anxiety/Delirium protocol (if indicated): fentanyl, versed  VAP protocol (if indicated): yes DVT prophylaxis: heparin SQ GI prophylaxis: PPI Glucose control: SSI  Mobility: BR Code Status: Full  Family Communication: Daughter, [place], [phone] Disposition: ICU- family requested transfer to [place]- called, EDP spoke with Dr. Geralynn Rile, accepted, but no available beds  Labs   CBC: Recent Labs  Lab 09/02/18 1457 09/04/18 0835 09/08/18 2339 09/09/18 0003 09/09/18 0123  WBC 10.5 12.4* 24.1*  --   --   NEUTROABS 9.5*  --  22.2*  --   --   HGB 12.9 13.3 12.7 13.6 12.6  HCT 40.6 42.1 41.4 40.0 37.0  MCV 87.9 87.3 91.6  --   --   PLT 487* 501* 452*  --   --     Basic Metabolic Panel: Recent Labs  Lab 09/02/18 1457 09/04/18 0835 09/08/18 2339 09/09/18 0003 09/09/18 0123  NA 135 136 138 134* 135  K 4.4 4.2 3.6 2.9* 3.2*  CL 96* 94* 96*  --   --   CO2 30 31 32  --   --  GLUCOSE 181* 214* 266*  --   --   BUN 19 20 17   --   --   CREATININE 0.66 0.82 1.13*  --   --   CALCIUM 9.2 9.7 9.0  --   --    GFR: Estimated Creatinine Clearance: 47.6 mL/min (A) (by C-G formula based on SCr of 1.13 mg/dL (H)). Recent Labs  Lab 09/02/18 1457 09/04/18 0835 09/08/18 2339 09/09/18 0224 09/09/18 0428  WBC 10.5 12.4* 24.1*  --   --   LATICACIDVEN  --   --   --  1.2 2.1*    Liver Function Tests: Recent Labs  Lab 09/02/18 1457 09/04/18 0835 09/08/18 2339  AST 14* 13* 22  ALT 10 10 13   ALKPHOS 82 95 87  BILITOT 0.6 0.7 0.6  PROT 6.9 7.8 7.0  ALBUMIN 3.4* 3.9 3.7   Recent Labs  Lab 09/04/18 0835  LIPASE 20   Recent Labs  Lab 09/09/18 0048  AMMONIA 49*   ABG    Component Value Date/Time   PHART 7.380 09/09/2018 0123   PCO2ART 54.2 (H) 09/09/2018 0123   PO2ART 145.0 (H) 09/09/2018 0123   HCO3 32.3 (H) 09/09/2018 0123   TCO2 34 (H) 09/09/2018 0123   O2SAT 99.0 09/09/2018 0123      Coagulation Profile: No results for input(s): INR, PROTIME in the last 168 hours.  Cardiac Enzymes: No results for input(s): CKTOTAL, CKMB, CKMBINDEX, TROPONINI in the last 168 hours.  HbA1C: Hemoglobin A1C  Date/Time Value Ref Range Status  09/12/2012 04:39 AM 12.0 (H) 4.2 - 6.3 % Final    Comment:    The American Diabetes Association recommends that a primary goal of therapy should be <7% and that physicians should reevaluate the treatment regimen in patients with HbA1c values consistently >8%.   10/18/2011 05:31 AM 8.2 (H) 4.2 - 6.3 % Final    Comment:    The American Diabetes Association recommends that a primary goal of therapy should be <7% and that physicians should reevaluate the treatment regimen in patients with HbA1c values consistently >8%.    Hgb A1c MFr Bld  Date/Time Value Ref Range Status  09/09/2018 04:28 AM 9.3 (H) 4.8 - 5.6 % Final    Comment:    (NOTE) Pre diabetes:          5.7%-6.4% Diabetes:              >6.4% Glycemic control for   <7.0% adults with diabetes   07/29/2018 04:04 AM 9.5 (H) 4.8 - 5.6 % Final    Comment:    (NOTE) Pre diabetes:          5.7%-6.4% Diabetes:              >6.4% Glycemic control for   <7.0% adults with diabetes     CBG: Recent Labs  Lab 09/02/18 1425 09/09/18 0012 09/09/18 0411 09/09/18 0748  GLUCAP 164* 243* 216* 141*   The patient is critically ill with multiple organ systems failure and requires high complexity decision making for assessment and support, frequent evaluation and titration of therapies, application of advanced monitoring technologies and extensive interpretation of multiple databases.   Critical Care Time devoted to patient care services described in this note is  45  Minutes. This time reflects time of care of this signee Dr Jennet Maduro. This critical care time does not reflect procedure time, or teaching time or supervisory time of PA/NP/Med student/Med Resident etc but could involve care  discussion time.  Rush Farmer, M.D. [facility] Pulmonary/Critical Care Medicine. Pager: [phone]. After hours pager: [phone].

## 2018-09-09 NOTE — Plan of Care (Signed)
  Problem: Education: Goal: Knowledge of General Education information will improve Description: Including pain rating scale, medication(s)/side effects and non-pharmacologic comfort measures Outcome: Not Applicable   Problem: Health Behavior/Discharge Planning: Goal: Ability to manage health-related needs will improve Outcome: Not Applicable   Problem: Clinical Measurements: Goal: Ability to maintain clinical measurements within normal limits will improve Outcome: Not Applicable Goal: Will remain free from infection Outcome: Not Applicable Goal: Diagnostic test results will improve Outcome: Not Applicable Goal: Respiratory complications will improve Outcome: Not Applicable Goal: Cardiovascular complication will be avoided Outcome: Not Applicable   Problem: Activity: Goal: Risk for activity intolerance will decrease Outcome: Not Applicable   Problem: Nutrition: Goal: Adequate nutrition will be maintained Outcome: Not Applicable   Problem: Coping: Goal: Level of anxiety will decrease Outcome: Not Applicable   Problem: Elimination: Goal: Will not experience complications related to bowel motility Outcome: Not Applicable Goal: Will not experience complications related to urinary retention Outcome: Not Applicable   Problem: Pain Managment: Goal: General experience of comfort will improve Outcome: Not Applicable   Problem: Safety: Goal: Ability to remain free from injury will improve Outcome: Not Applicable   Problem: Skin Integrity: Goal: Risk for impaired skin integrity will decrease Outcome: Not Applicable     RN unable to complete care plan because patient left AMA. RN did educate patient on what to look out for at home and when she needs to be seen in the ED. Patient was receptive to education and thankful for RNs help.

## 2018-09-09 NOTE — ED Provider Notes (Signed)
I spoke to daughter [place] via phone at [phone].  Advised that we were unable to transfer to [facility] due to no beds.  She will be admitted to [facility] for now, and is on the wait list at [facility].   Ripley Fraise, MD 09/09/18 [phone]

## 2018-09-09 NOTE — Telephone Encounter (Signed)
Was expecting a call from day surgery to schedule the patient a follow up. I looked in the chart and noticed the patient was admitted into the hospital and was unable to make it for injection. Patient is at [facility].

## 2018-09-09 NOTE — H&P (Signed)
NAME:  Katie Guzman, MRN:  [id number], DOB:  1970-04-01, LOS: 0 ADMISSION DATE:  09/08/2018, CONSULTATION DATE:  09/09/2018 REFERRING MD:  Dr. Christy Gentles, CHIEF COMPLAINT:  AMS/ resp failure  Brief History   48 year old female currently under outpatient treatment for right foot osteomyelitis and supraglottic/ laryngeal cancer at [facility] with radiation found unresponsive in [employer] drive thru with suspected OD.  Given narcan with improvement but then combative and vomited.  Required intubation in ER for ongoing hypercarbic respiratory failure found to have probable aspiration pneumonia and AKI.  Family wanting transfer to [facility]- no beds currently, therefore admitted to [facility].   History of present illness   HPI obtained from medical chart review as patient is intubated and sedated.    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    Patient reportedly found unresponsive at [facility] drive thru.  On EMS arrival, found with pinpoint pupils, required BVM and faint pulses given Narcan 1 mg with improvement in respiratory drive and then became combative and vomited.  On arrival to ER, patient remained somnolent and hypercarbic by ABG- 7.214/ 90.9/ 139/ 36.7.  Daughter at bedside in ER, wanted to discuss intubation with her sister prior to proceeding but returned yelling and screaming going through patient's belongings and then escorted out.  Patient then intubated for airway protection.  Noted to be a difficult intubation with a mass noted.  Labs noted for glucose 266, sCr 1.13 (baseline 0.82), WBC 24.1 (prior 12.4), HCG quant neg, neg ETOH/ salicylate/ acetaminophen levels, UDS positive for cocaine, opiates, and THC.  COVID negative.  CXR showing mild patchy lingular/  left lower lobe opacities.  Empirically started on ceftriaxone and azithro.  Family in ER requested transfer to [facility].  [facility] contacted by EDP, accepted, however no beds available at this time, therefore patient will be admitted here till bed available to PCCM.    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 at [facility] and lung cancer, ongoing tobacco abuse, COPD, chronic pain, polysubstance abuse, depression, DM, diabetic foot ulcer,   Significant Hospital Events   7/31 Admit  Consults:  [place] called for transfer per family request in ED, no beds  Procedures:  7/30 ETT >>  Significant Diagnostic Tests:    Micro Data:  7/30 SARS CoV-2 >> negative 7/30 UC >> 7/30 BCx 2 >>  Antimicrobials:  7/24 oritavancin   7/31 azithro  7/31 ceftriaxone  7/31 unasyn >>  Interim history/subjective:   Objective   Blood pressure 95/60, pulse (!) 107, temperature (!) 97.1 F (36.2 C), temperature source Rectal, resp. rate 20, height 5\' 2"  (1.575 m), weight 49.9 kg, SpO2 100 %.    Vent Mode: PRVC FiO2 (%):  [70 %-100 %] 70 % Set Rate:  [16 bmp-20 bmp] 16 bmp Vt Set:  [400 mL] 400 mL PEEP:  [5 cmH20] 5 cmH20 Plateau Pressure:  [22 cmH20] 22 cmH20  No intake or output data in the 24 hours ending 09/09/18 0202 Filed Weights   09/09/18 0046  Weight: 49.9 kg   Examination: General:  Older appearing female sedated on MV HEENT: MM pink/moist, ETT at 24, OGT, pupils 2/reactive, poor dentition  Neuro: sedated CV: rr, no murmur  PULM:  Even/ non-labored, diffuse rhonchi, rales LLL GI: soft, bs hypoactive, no foley  Extremities: warm/dry, no edema, right foot with approximated wound without redness / warmth/ or drainage with amputation of great toe, left foot with prior left great and 2nd toe amputation Skin: no rashes   Resolved Hospital Problem list     Assessment & Plan:   Acute  hypercarbic respiratory failure related to suspected drug overdose COPD- not AE Difficult airway- reported supraglottic mass on ER intubation with very distorted view- hx of laryngeal/ supraglottic carcinoma tx with radiation only at [facility] given ongoing foot infection- chemo deferred Ongoing tobacco abuse P:  Full MV support Trend CXR/ ABG VAP bundle Duonebs q 6 and prn See below  May be tenuous airway for extubation given larygneal cancer with noted mass on intubation, high risk for post extubation complications, including bleeding.  Will leave intubated tonight, wean sedation, and reassess extubation in am.    Toxic encephalopathy from suspected drug OD Polysubstance abuse, depression, chronic pain -seen at [facility] pain medicine - UDS positive for opiates, cocaine, THC P:  PAD Protocol with fentanyl/ precedex for RASS goal -1/-2 Monitor QTc  -currently 431    AKI P:  S/p 1.5L in ER LR at 50 ml/hr Trend BMP / mag/ phos/ urinary output Replace electrolytes as indicated Avoid nephrotoxic agents, ensure adequate renal perfusion   DMT2- insulin dependent - HA1c in June 2020 9.5 P:  CBG q 4 SSI sensitive May need to add levemir  Leukocytosis - aspiration pna and ongoing outpt tx for osteomyelitis P:  Follow cultures  Trend WBC/ fever curve Continue with unasyn  Will need ID consult in am given uncertainty of abx compliance for osteo   Recent right foot osteomyelitis s/p amputation on 7/10 P:  Outpatient abx regimen supposed to be oritvancin 1200mg  once a week for 2 weeks then based on assessment (last dose 7/24) and Levaquin 500 mg daily and flagyl 500 mg TID for 2 weeks- unclear if patient taking, missed last outpatient follow-up appt - will need ID consult in am for further recommendations   hx of laryngeal/ supraglottic carcinoma - treated at [facility], radiation only, chemo not given due to infection and uncontrolled DM - care everywhere notes indicate recent weight loss  due to odynophagia, teeth breaking when chewing and xerostomia, was on marinol but due to cost not taking? P:  Pending to [facility] ?needs PMT for GOCs  Best practice:  Diet: NPO Pain/Anxiety/Delirium protocol (if indicated): fentanyl, versed  VAP protocol (if indicated): yes DVT prophylaxis: heparin SQ GI prophylaxis: PPI Glucose control: SSI  Mobility: BR Code Status: Full  Family Communication: Daughter, [place], [phone] Disposition: ICU- family requested transfer to [facility]- called, EDP spoke with Dr. Geralynn Rile, accepted, but no available beds  Labs   CBC: Recent Labs  Lab 09/02/18 1457 09/04/18 0835 09/08/18 2339 09/09/18 0003 09/09/18 0123  WBC 10.5 12.4* 24.1*  --   --   NEUTROABS 9.5*  --  22.2*  --   --   HGB 12.9 13.3 12.7 13.6 12.6  HCT 40.6 42.1 41.4 40.0 37.0  MCV 87.9 87.3 91.6  --   --   PLT 487* 501* 452*  --   --     Basic Metabolic Panel: Recent Labs  Lab 09/02/18 1457 09/04/18 0835 09/08/18 2339 09/09/18 0003 09/09/18 0123  NA 135 136 138 134* 135  K 4.4 4.2 3.6 2.9* 3.2*  CL 96* 94* 96*  --   --  CO2 30 31 32  --   --   GLUCOSE 181* 214* 266*  --   --   BUN 19 20 17   --   --   CREATININE 0.66 0.82 1.13*  --   --   CALCIUM 9.2 9.7 9.0  --   --    GFR: Estimated Creatinine Clearance: 47.4 mL/min (A) (by C-G formula based on SCr of 1.13 mg/dL (H)). Recent Labs  Lab 09/02/18 1457 09/04/18 0835 09/08/18 2339  WBC 10.5 12.4* 24.1*    Liver Function Tests: Recent Labs  Lab 09/02/18 1457 09/04/18 0835 09/08/18 2339  AST 14* 13* 22  ALT 10 10 13   ALKPHOS 82 95 87  BILITOT 0.6 0.7 0.6  PROT 6.9 7.8 7.0  ALBUMIN 3.4* 3.9 3.7   Recent Labs  Lab 09/04/18 0835  LIPASE 20   Recent Labs  Lab 09/09/18 0048  AMMONIA 49*    ABG    Component Value Date/Time   PHART 7.380 09/09/2018 0123   PCO2ART 54.2 (H) 09/09/2018 0123   PO2ART 145.0 (H) 09/09/2018 0123   HCO3 32.3 (H) 09/09/2018 0123   TCO2 34 (H) 09/09/2018 0123   O2SAT 99.0  09/09/2018 0123     Coagulation Profile: No results for input(s): INR, PROTIME in the last 168 hours.  Cardiac Enzymes: No results for input(s): CKTOTAL, CKMB, CKMBINDEX, TROPONINI in the last 168 hours.  HbA1C: Hemoglobin A1C  Date/Time Value Ref Range Status  09/12/2012 04:39 AM 12.0 (H) 4.2 - 6.3 % Final    Comment:    The American Diabetes Association recommends that a primary goal of therapy should be <7% and that physicians should reevaluate the treatment regimen in patients with HbA1c values consistently >8%.   10/18/2011 05:31 AM 8.2 (H) 4.2 - 6.3 % Final    Comment:    The American Diabetes Association recommends that a primary goal of therapy should be <7% and that physicians should reevaluate the treatment regimen in patients with HbA1c values consistently >8%.    Hgb A1c MFr Bld  Date/Time Value Ref Range Status  07/29/2018 04:04 AM 9.5 (H) 4.8 - 5.6 % Final    Comment:    (NOTE) Pre diabetes:          5.7%-6.4% Diabetes:              >6.4% Glycemic control for   <7.0% adults with diabetes   08/23/2016 08:53 AM 13.8 (H) 4.8 - 5.6 % Final    Comment:    (NOTE)         Pre-diabetes: 5.7 - 6.4         Diabetes: >6.4         Glycemic control for adults with diabetes: <7.0     CBG: Recent Labs  Lab 09/02/18 1425 09/09/18 0012  GLUCAP 164* 243*    Review of Systems:   unable  Past Medical History  She,  has a past medical history of Asthma, Cancer ([place]), Cellulitis (04/15/2015), COPD (chronic obstructive pulmonary disease) ([place]), Diabetes mellitus, Emphysema, Fibromyalgia, Neuropathy, and Tenosynovitis of foot (04/16/2015).   Surgical History    Past Surgical History:  Procedure Laterality Date  . AMPUTATION Right 08/19/2018   Procedure: AMPUTATION RAY;  Surgeon: Sharlotte Alamo, DPM;  Location: [facility] ORS;  Service: Podiatry;  Laterality: Right;  . AMPUTATION TOE Right 08/19/2018   Procedure: AMPUTATION TOE;  Surgeon: Sharlotte Alamo, DPM;  Location: [facility] ORS;   Service: Podiatry;  Laterality: Right;  . BACK SURGERY    .  CESAREAN SECTION    . TONSILLECTOMY    . TUBAL LIGATION    . WOUND DEBRIDEMENT Right 08/19/2018   Procedure: DEBRIDEMENT WOUND RIGHT FOOT, DIABETIC;  Surgeon: Sharlotte Alamo, DPM;  Location: [facility] ORS;  Service: Podiatry;  Laterality: Right;     Social History   reports that she has been smoking cigarettes. She has a 15.00 pack-year smoking history. She has never used smokeless tobacco. She reports that she does not drink alcohol or use drugs.   Family History   Her family history includes Lung cancer in her mother.   Allergies Allergies  Allergen Reactions  . Flexeril [Cyclobenzaprine] Hives and Nausea And Vomiting  . Hydrocodone Hives  . Other Hives  . Darvocet [Propoxyphene N-Acetaminophen] Hives and Nausea And Vomiting  . Fish-Derived Products Other (See Comments)    Patient does not like to eat fish. Not allergic but prefers not to.  . Propofol Nausea And Vomiting  . Toradol [Ketorolac Tromethamine] Nausea And Vomiting  . Vicodin [Hydrocodone-Acetaminophen] Hives and Nausea And Vomiting  . Ketorolac Nausea And Vomiting  . Tramadol Nausea And Vomiting    Vomiting only     Home Medications  Prior to Admission medications   Medication Sig Start Date End Date Taking? Authorizing Provider  acetaminophen (TYLENOL) 325 MG tablet Take 650 mg by mouth every 6 (six) hours as needed for mild pain.    [provider]  albuterol (PROVENTIL HFA;VENTOLIN HFA) 108 (90 BASE) MCG/ACT inhaler Inhale 2 puffs into the lungs every 4 (four) hours as needed for wheezing or shortness of breath. For wheeze or shortness of breath Patient taking differently: Inhale 2 puffs into the lungs 2 (two) times a day. For wheeze or shortness of breath 10/23/14   Gouru, Illene Silver, MD  albuterol (PROVENTIL) (2.5 MG/3ML) 0.083% nebulizer solution Take 2.5 mg by nebulization every 4 (four) hours as needed for wheezing or shortness of breath.     [provider]  ascorbic acid (VITAMIN C) 250 MG tablet Take 1 tablet (250 mg total) by mouth 2 (two) times daily. 07/31/18   Vaughan Basta, MD  escitalopram (LEXAPRO) 10 MG tablet Take 20 mg by mouth every morning. 07/27/18 01/23/19  [provider]  fluticasone (FLONASE) 50 MCG/ACT nasal spray Place 1 spray into both nostrils daily. 07/10/16   [provider]  Fluticasone-Salmeterol (ADVAIR) 250-50 MCG/DOSE AEPB Inhale 1 puff into the lungs every 12 (twelve) hours. 10/26/16   [provider]  gabapentin (NEURONTIN) 800 MG tablet Take 1 tablet (800 mg total) by mouth 3 (three) times daily. 04/01/17   Edrick Kins, DPM  glycerin adult 2 g suppository Place 1 suppository rectally as needed for constipation. 09/04/18   Merlyn Lot, MD  ibuprofen (ADVIL) 800 MG tablet Take 800 mg by mouth every 6 (six) hours as needed for fever or mild pain.     [provider]  insulin glargine (LANTUS) 100 UNIT/ML injection Inject 0.25 mLs (25 Units total) into the skin at bedtime. 07/31/18   Vaughan Basta, MD  insulin lispro (HUMALOG) 100 UNIT/ML injection Inject 1-18 Units into the skin 4 (four) times daily -  before meals and at bedtime. Sliding scale  07/11/16 08/24/18  [provider]  lidocaine (LIDODERM) 5 % Place 1 patch onto the skin daily as needed. For pain. Remove & Discard patch within 12 hours or as directed by MD    [provider]  nicotine (NICODERM CQ - DOSED IN MG/24 HOURS) 21 mg/24hr patch  Place 1 patch (21 mg total) onto the skin daily. Patient taking differently: Place 21 mg onto the skin daily as needed (smoking cessation).  10/23/14   Gouru, Illene Silver, MD  polyethylene glycol (MIRALAX / GLYCOLAX) 17 g packet Take 17 g by mouth daily. Mix one tablespoon with 8oz of your favorite juice or water every day until you are having soft formed stools. 09/04/18   Merlyn Lot, MD  senna-docusate (SENOKOT-S) 8.6-50 MG tablet Take 1 tablet  by mouth daily. 04/13/18 04/13/19  [provider]  tiotropium (SPIRIVA) 18 MCG inhalation capsule Place 1 capsule (18 mcg total) into inhaler and inhale daily. 10/23/14   Nicholes Mango, MD     Critical care time:  34 mins    Kennieth Rad, MSN, AGACNP-BC [facility] Pgr: [phone] or if no answer [phone] 09/09/2018, 3:05 AM

## 2018-09-09 NOTE — Progress Notes (Deleted)
..  STAFF NOTE  I, Dr Seward Carol have personally reviewed patient's available data, including medical history, events of note, physical examination and test results as part of my evaluation. I have discussed with NP Moshe Cipro and other care providers such as pharmacist, RN and Elink.  In addition,  I personally evaluated patient   48 year old female currently under outpatient treatment for right foot osteomyelitis and supraglottic/ laryngeal cancer at [facility] with radiation found unresponsive in fast food drive thru with suspected OD.  Given 1mg  narcan with improvement but then became combative and vomited.  Required intubation in ER for ongoing hypercarbic respiratory failure evidence suggests aspiration into airway and AKI.  Family requested transfer to [facility] after an abrasive encounter in the ED ( details are unclear)- they were willing to accept the patient but had no beds currently, therefore Patient will be admitted to [facility].   ASSESSMENT AND PLAN:  1.   Acute respiratory insufficiency secondary to opioid intoxication>> endotracheally intubated in ED. Has a h/o supraglottic cancer not treated with chemo due to her uncontrolled IDDM.Plan: Full MV support. When ABG improves anticipate a difficult balance in keeping pt comfortable and oriented to wean from support.  Please check cuff leak before extubation due to h/o supraglottic cancer. Given her H/o COPD.  Bronchodilators Q 6 prn wheezing.  2. Acute toxic encephalopathy: pt received a large does of Narcan and responded but then vomited and became agitated>>unclear what transpired>>documented she became somnolent again. PCO2 on ABG 90. UDS positive for Opiates , cocaine and THC. Attends plain clinc at [facility] failed interventions there. Anticipate difficulties Plan: RASS Goal -1 to -2 until ABG normalizes. Sedation per protocol. Will start on oral gabapentin, Seroquel and klonopin. Will attempt to transition pt from fentanyl to Precedex and prn  Versed    Rest per NP Simpsonwhose note  I agree with  The patient is critically ill with multiple organ systems failure and requires high complexity decision making for assessment and support, frequent evaluation and titration of therapies, application of advanced monitoring technologies and extensive interpretation of multiple databases.   Critical Care Time devoted to patient care services described in this note is 27 Minutes. This time reflects time of care of this signee Dr Seward Carol. This critical care time does not reflect procedure time, or teaching time or supervisory time of NP but could involve care discussion time   CC TIME: 55 minutes CODE STATUS: FULL DISPOSITION: ICU PROGNOSIS: Guarded FAMILY: Daughter Gari Crown [phone]     Dr. Seward Carol Pulmonary Critical Care Medicine  09/09/2018 2:48 AM

## 2018-09-09 NOTE — Progress Notes (Signed)
CRITICAL VALUE ALERT  Critical Value:  Lactic acid 2.1  Date & Time Notied:  09/09/18 5:02 AM  Provider Notified: elink

## 2018-09-09 NOTE — ED Notes (Signed)
ED TO INPATIENT HANDOFF REPORT  ED Nurse Name and Phone #:  [phone]  S Name/Age/Gender Katie Guzman 48 y.o. female Room/Bed: 025C/025C  Code Status   Code Status: Full Code  Home/SNF/Other Home Patient oriented to: self - pt intubated unable to assess at this time  Is this baseline? No   Triage Complete: Triage complete  Chief Complaint Overdose   Triage Note Pt. BIB [facility] after overdose during [facility] drive thru. Emesis x1. EMS arrival faint pulses. Pt bagged. Pinpoint pupils   1 mg Narcan given IV with improvement of respiratory drive.   Pt combative.   [facility] VS BP 197/89 RR 23 PR 110 SpO2  on NR    Allergies Allergies  Allergen Reactions  . Flexeril [Cyclobenzaprine] Hives and Nausea And Vomiting  . Hydrocodone Hives  . Other Hives  . Darvocet [Propoxyphene N-Acetaminophen] Hives and Nausea And Vomiting  . Fish-Derived Products Other (See Comments)    Patient does not like to eat fish. Not allergic but prefers not to.  . Propofol Nausea And Vomiting  . Toradol [Ketorolac Tromethamine] Nausea And Vomiting  . Vicodin [Hydrocodone-Acetaminophen] Hives and Nausea And Vomiting  . Ketorolac Nausea And Vomiting  . Tramadol Nausea And Vomiting    Vomiting only    Level of Care/Admitting Diagnosis ED Disposition    ED Disposition Condition [facility] Area: [place] [[id number]]  Level of Care: ICU [6]  Covid Evaluation: Confirmed COVID Negative  Diagnosis: Toxic encephalopathy [349.82.ICD-9-CM]  Admitting Physician: Kandice Hams [[id number]]  Attending Physician: Kandice Hams [[id number]]  Estimated length of stay: 3 - 4 days  Certification:: I certify this patient will need inpatient services for at least 2 midnights  PT Class (Do Not Modify): Inpatient [101]  PT Acc Code (Do Not Modify): Private [1]       B Medical/Surgery History Past Medical History:  Diagnosis Date  . Asthma   . Cancer  ([place])    lung  . Cellulitis 04/15/2015  . COPD (chronic obstructive pulmonary disease) ([place])   . Diabetes mellitus    Poorly controlled with complications  . Emphysema   . Fibromyalgia   . Neuropathy   . Tenosynovitis of foot 04/16/2015   Past Surgical History:  Procedure Laterality Date  . AMPUTATION Right 08/19/2018   Procedure: AMPUTATION RAY;  Surgeon: Sharlotte Alamo, DPM;  Location: [facility] ORS;  Service: Podiatry;  Laterality: Right;  . AMPUTATION TOE Right 08/19/2018   Procedure: AMPUTATION TOE;  Surgeon: Sharlotte Alamo, DPM;  Location: [facility] ORS;  Service: Podiatry;  Laterality: Right;  . BACK SURGERY    . CESAREAN SECTION    . TONSILLECTOMY    . TUBAL LIGATION    . WOUND DEBRIDEMENT Right 08/19/2018   Procedure: DEBRIDEMENT WOUND RIGHT FOOT, DIABETIC;  Surgeon: Sharlotte Alamo, DPM;  Location: [facility] ORS;  Service: Podiatry;  Laterality: Right;     A IV Location/Drains/Wounds Patient Lines/Drains/Airways Status   Active Line/Drains/Airways    Name:   Placement date:   Placement time:   Site:   Days:   Peripheral IV 09/08/18 Left Antecubital   09/08/18    2349    Antecubital   1   Peripheral IV 09/09/18 Right Antecubital   09/09/18    0204    Antecubital   less than 1   NG/OG Tube Orogastric 16 Fr. Center mouth Xray;Aucultation   09/09/18    0119    Center mouth   less than 1  Airway 7 mm   09/09/18    0034     less than 1   Incision (Closed) 08/19/18 Foot Right   08/19/18    0917     21          Intake/Output Last 24 hours No intake or output data in the 24 hours ending 09/09/18 0216  Labs/Imaging Results for orders placed or performed during the hospital encounter of 09/08/18 (from the past 48 hour(s))  Comprehensive metabolic panel     Status: Abnormal   Collection Time: 09/08/18 11:39 PM  Result Value Ref Range   Sodium 138 135 - 145 mmol/L   Potassium 3.6 3.5 - 5.1 mmol/L   Chloride 96 (L) 98 - 111 mmol/L   CO2 32 22 - 32 mmol/L   Glucose, Bld 266 (H) 70 - 99 mg/dL   BUN  17 6 - 20 mg/dL   Creatinine, Ser 1.13 (H) 0.44 - 1.00 mg/dL   Calcium 9.0 8.9 - 10.3 mg/dL   Total Protein 7.0 6.5 - 8.1 g/dL   Albumin 3.7 3.5 - 5.0 g/dL   AST 22 15 - 41 U/L   ALT 13 0 - 44 U/L   Alkaline Phosphatase 87 38 - 126 U/L   Total Bilirubin 0.6 0.3 - 1.2 mg/dL   GFR calc non Af Amer 57 (L) >60 mL/min   GFR calc Af Amer >60 >60 mL/min   Anion gap 10 5 - 15    Comment: Performed at [facility], [address], [place], [place] [address]  CBC WITH DIFFERENTIAL     Status: Abnormal   Collection Time: 09/08/18 11:39 PM  Result Value Ref Range   WBC 24.1 (H) 4.0 - 10.5 K/uL   RBC 4.52 3.87 - 5.11 MIL/uL   Hemoglobin 12.7 12.0 - 15.0 g/dL   HCT 41.4 36.0 - 46.0 %   MCV 91.6 80.0 - 100.0 fL   MCH 28.1 26.0 - 34.0 pg   MCHC 30.7 30.0 - 36.0 g/dL   RDW 14.6 11.5 - 15.5 %   Platelets 452 (H) 150 - 400 K/uL   nRBC 0.0 0.0 - 0.2 %   Neutrophils Relative % 93 %   Neutro Abs 22.2 (H) 1.7 - 7.7 K/uL   Lymphocytes Relative 2 %   Lymphs Abs 0.5 (L) 0.7 - 4.0 K/uL   Monocytes Relative 4 %   Monocytes Absolute 1.1 (H) 0.1 - 1.0 K/uL   Eosinophils Relative 0 %   Eosinophils Absolute 0.1 0.0 - 0.5 K/uL   Basophils Relative 0 %   Basophils Absolute 0.1 0.0 - 0.1 K/uL   Immature Granulocytes 1 %   Abs Immature Granulocytes 0.12 (H) 0.00 - 0.07 K/uL    Comment: Performed at [facility], [address] [place], [place] [address]  Urinalysis, Complete w Microscopic     Status: Abnormal   Collection Time: 09/08/18 11:48 PM  Result Value Ref Range   Color, Urine YELLOW YELLOW   APPearance CLEAR CLEAR   Specific Gravity, Urine 1.011 1.005 - 1.030   pH 6.0 5.0 - 8.0   Glucose, UA >=500 (A) NEGATIVE mg/dL   Hgb urine dipstick NEGATIVE NEGATIVE   Bilirubin Urine NEGATIVE NEGATIVE   Ketones, ur NEGATIVE NEGATIVE mg/dL   Protein, ur NEGATIVE NEGATIVE mg/dL   Nitrite NEGATIVE NEGATIVE   Leukocytes,Ua NEGATIVE NEGATIVE   RBC / HPF 0-5 0 - 5 RBC/hpf   WBC, UA 0-5 0 - 5  WBC/hpf  Bacteria, UA NONE SEEN NONE SEEN   Squamous Epithelial / LPF 0-5 0 - 5   Hyaline Casts, UA PRESENT     Comment: Performed at [facility], [place] [address] [place], [place] [address]  SARS Coronavirus 2 (CEPHEID - Performed in [facility] hospital lab), Hosp Order     Status: None   Collection Time: 09/08/18 11:49 PM   Specimen: Nasopharyngeal Swab  Result Value Ref Range   SARS Coronavirus 2 NEGATIVE NEGATIVE    Comment: (NOTE) If result is NEGATIVE SARS-CoV-2 target nucleic acids are NOT DETECTED. The SARS-CoV-2 RNA is generally detectable in upper and lower  respiratory specimens during the acute phase of infection. The lowest  concentration of SARS-CoV-2 viral copies this assay can detect is 250  copies / mL. A negative result does not preclude SARS-CoV-2 infection  and should not be used as the sole basis for treatment or other  patient management decisions.  A negative result may occur with  improper specimen collection / handling, submission of specimen other  than nasopharyngeal swab, presence of viral mutation(s) within the  areas targeted by this assay, and inadequate number of viral copies  (<250 copies / mL). A negative result must be combined with clinical  observations, patient history, and epidemiological information. If result is POSITIVE SARS-CoV-2 target nucleic acids are DETECTED. The SARS-CoV-2 RNA is generally detectable in upper and lower  respiratory specimens dur ing the acute phase of infection.  Positive  results are indicative of active infection with SARS-CoV-2.  Clinical  correlation with patient history and other diagnostic information is  necessary to determine patient infection status.  Positive results do  not rule out bacterial infection or co-infection with other viruses. If result is PRESUMPTIVE POSTIVE SARS-CoV-2 nucleic acids MAY BE PRESENT.   A presumptive positive result was obtained on the submitted specimen  and confirmed on  repeat testing.  While 2019 novel coronavirus  (SARS-CoV-2) nucleic acids may be present in the submitted sample  additional confirmatory testing may be necessary for epidemiological  and / or clinical management purposes  to differentiate between  SARS-CoV-2 and other Sarbecovirus currently known to infect humans.  If clinically indicated additional testing with an alternate test  methodology [phone]) is advised. The SARS-CoV-2 RNA is generally  detectable in upper and lower respiratory sp ecimens during the acute  phase of infection. The expected result is Negative. Fact Sheet for Patients:  StrictlyIdeas.no Fact Sheet for Healthcare Providers: BankingDealers.co.za This test is not yet approved or cleared by the [place] FDA and has been authorized for detection and/or diagnosis of SARS-CoV-2 by FDA under an Emergency Use Authorization (EUA).  This EUA will remain in effect (meaning this test can be used) for the duration of the COVID-19 declaration under Section 564(b)(1) of the Act, 21 U.S.C. section 360bbb-3(b)(1), unless the authorization is terminated or revoked sooner. Performed at [facility], [place] [address] [place], [place] [address]   Urine rapid drug screen (hosp performed)     Status: Abnormal   Collection Time: 09/08/18 11:49 PM  Result Value Ref Range   Opiates POSITIVE (A) NONE DETECTED   Cocaine POSITIVE (A) NONE DETECTED   Benzodiazepines NONE DETECTED NONE DETECTED   Amphetamines NONE DETECTED NONE DETECTED   Tetrahydrocannabinol POSITIVE (A) NONE DETECTED   Barbiturates NONE DETECTED NONE DETECTED    Comment: (NOTE) DRUG SCREEN FOR MEDICAL PURPOSES ONLY.  IF CONFIRMATION IS NEEDED FOR ANY PURPOSE, NOTIFY LAB WITHIN 5 DAYS.  LOWEST DETECTABLE LIMITS FOR URINE DRUG SCREEN Drug Class                     Cutoff (ng/mL) Amphetamine and metabolites    1000 Barbiturate and metabolites     200 Benzodiazepine                 956 Tricyclics and metabolites     300 Opiates and metabolites        300 Cocaine and metabolites        300 THC                            50 Performed at [facility], [place] [address], [place], [place] [address]   I-STAT 7, (LYTES, BLD GAS, ICA, H+H)     Status: Abnormal   Collection Time: 09/09/18 12:03 AM  Result Value Ref Range   pH, Arterial 7.214 (L) 7.350 - 7.450   pCO2 arterial 90.9 (HH) 32.0 - 48.0 mmHg   pO2, Arterial 139.0 (H) 83.0 - 108.0 mmHg   Bicarbonate 36.7 (H) 20.0 - 28.0 mmol/L   TCO2 39 (H) 22 - 32 mmol/L   O2 Saturation 98.0 %   Acid-Base Excess 6.0 (H) 0.0 - 2.0 mmol/L   Sodium 134 (L) 135 - 145 mmol/L   Potassium 2.9 (L) 3.5 - 5.1 mmol/L   Calcium, Ion 1.28 1.15 - 1.40 mmol/L   HCT 40.0 36.0 - 46.0 %   Hemoglobin 13.6 12.0 - 15.0 g/dL   Patient temperature 98.6 F    Collection site RADIAL, ALLEN'S TEST ACCEPTABLE    Drawn by RT    Sample type ARTERIAL    Comment NOTIFIED PHYSICIAN   I-Stat beta hCG blood, ED     Status: None   Collection Time: 09/09/18 12:07 AM  Result Value Ref Range   I-stat hCG, quantitative <5.0 <5 mIU/mL   Comment 3            Comment:   GEST. AGE      CONC.  (mIU/mL)   <=1 WEEK        5 - 50     2 WEEKS       50 - 500     3 WEEKS       100 - 10,000     4 WEEKS     1,000 - 30,000        FEMALE AND NON-PREGNANT FEMALE:     LESS THAN 5 mIU/mL   CBG monitoring, ED     Status: Abnormal   Collection Time: 09/09/18 12:12 AM  Result Value Ref Range   Glucose-Capillary 243 (H) 70 - 99 mg/dL  Ammonia     Status: Abnormal   Collection Time: 09/09/18 12:48 AM  Result Value Ref Range   Ammonia 49 (H) 9 - 35 umol/L    Comment: Performed at [facility], [place] [address], [place], [place] [address]  Ethanol     Status: None   Collection Time: 09/09/18 12:48 AM  Result Value Ref Range   Alcohol, Ethyl (B) <10 <10 mg/dL    Comment: (NOTE) Lowest detectable limit for serum alcohol is 10  mg/dL. For medical purposes only. Performed at [facility], [place] [address] [place], [place] [address]   Acetaminophen level     Status: Abnormal   Collection Time: 09/09/18 12:48 AM  Result Value Ref Range  Acetaminophen (Tylenol), Serum <10 (L) 10 - 30 ug/mL    Comment: (NOTE) Therapeutic concentrations vary significantly. A range of 10-30 ug/mL  may be an effective concentration for many patients. However, some  are best treated at concentrations outside of this range. Acetaminophen concentrations >150 ug/mL at 4 hours after ingestion  and >50 ug/mL at 12 hours after ingestion are often associated with  toxic reactions. Performed at [facility], [place] [address], [place], [place] [address]   Salicylate level     Status: None   Collection Time: 09/09/18 12:48 AM  Result Value Ref Range   Salicylate Lvl <1.4 2.8 - 30.0 mg/dL    Comment: Performed at [place] [address] [place], [place] [address]  I-STAT 7, (LYTES, BLD GAS, ICA, H+H)     Status: Abnormal   Collection Time: 09/09/18  1:23 AM  Result Value Ref Range   pH, Arterial 7.380 7.350 - 7.450   pCO2 arterial 54.2 (H) 32.0 - 48.0 mmHg   pO2, Arterial 145.0 (H) 83.0 - 108.0 mmHg   Bicarbonate 32.3 (H) 20.0 - 28.0 mmol/L   TCO2 34 (H) 22 - 32 mmol/L   O2 Saturation 99.0 %   Acid-Base Excess 6.0 (H) 0.0 - 2.0 mmol/L   Sodium 135 135 - 145 mmol/L   Potassium 3.2 (L) 3.5 - 5.1 mmol/L   Calcium, Ion 1.16 1.15 - 1.40 mmol/L   HCT 37.0 36.0 - 46.0 %   Hemoglobin 12.6 12.0 - 15.0 g/dL   Patient temperature 97.1 F    Collection site RADIAL, ALLEN'S TEST ACCEPTABLE    Drawn by RT    Sample type ARTERIAL    Dg Chest Portable 1 View  Result Date: 09/09/2018 CLINICAL DATA:  Post intubation EXAM: PORTABLE CHEST 1 VIEW COMPARISON:  07/28/2018 FINDINGS: Endotracheal tube terminates 2.7 cm above the carina. Mild patchy lingular/left lower lobe opacities, suspicious for pneumonia/aspiration. Mild right  infrahilar opacity. Nodular opacity at the lateral right lower hemithorax likely reflects a nipple shadow or overlying chest lead. The heart is normal in size. IMPRESSION: Endotracheal tube terminates 2.7 cm above the carina. Mild patchy lingular/left lower lobe opacities, suspicious for pneumonia/aspiration. Electronically Signed   By: Julian Hy M.D.   On: 09/09/2018 01:15   Dg Abd Portable 1v  Result Date: 09/09/2018 CLINICAL DATA:  NG tube placement EXAM: PORTABLE ABDOMEN - 1 VIEW COMPARISON:  CT 09/04/2018, 09/04/2018 radiograph FINDINGS: Right infrahilar atelectasis or infiltrate. Esophageal tube tip overlies the gastric body. IMPRESSION: Esophageal tube tip overlies gastric body Electronically Signed   By: Donavan Foil M.D.   On: 09/09/2018 01:43    Pending Labs Unresulted Labs (From admission, onward)    Start     Ordered   09/09/18 0127  Lactic acid, plasma  Now then every 2 hours,   STAT     09/09/18 0127   09/09/18 0127  Blood Culture (routine x 2)  BLOOD CULTURE X 2,   STAT     09/09/18 0127   09/09/18 0127  Urine culture  ONCE - STAT,   STAT     09/09/18 0127          Vitals/Pain Today's Vitals   09/09/18 0048 09/09/18 0100 09/09/18 0115 09/09/18 0215  BP:  138/79 95/60 96/60   Pulse: (!) 108 (!) 112 (!) 107 99  Resp: 19 (!) 21 20 16   Temp:      TempSrc:      SpO2: 100% 100% 100%  93%  Weight:      Height:        Isolation Precautions No active isolations  Medications Medications  propofol (DIPRIVAN) 1000 MG/100ML infusion (0 mcg/kg/min  49.9 kg Intravenous Stopped 09/09/18 0114)  fentaNYL (SUBLIMAZE) injection 50 mcg (has no administration in time range)  fentaNYL 25106mcg in NS 245mL (64mcg/ml) infusion-PREMIX (100 mcg/hr Intravenous New Bag/Given 09/09/18 0115)  midazolam (VERSED) injection 2 mg (2 mg Intravenous Given 09/09/18 0115)  midazolam (VERSED) injection 2 mg (has no administration in time range)  fentaNYL (SUBLIMAZE) 100 MCG/2ML injection (has  no administration in time range)  fentaNYL (SUBLIMAZE) bolus via infusion 50 mcg (50 mcg Intravenous Bolus from Bag 09/09/18 0116)  cefTRIAXone (ROCEPHIN) 2 g in sodium chloride 0.9 % 100 mL IVPB (2 g Intravenous New Bag/Given 09/09/18 0215)  azithromycin (ZITHROMAX) 500 mg in sodium chloride 0.9 % 250 mL IVPB (has no administration in time range)  sodium chloride 0.9 % bolus 1,000 mL (1,000 mLs Intravenous Not Given 09/09/18 0216)    And  sodium chloride 0.9 % bolus 500 mL (500 mLs Intravenous New Bag/Given 09/09/18 0208)  heparin injection 5,000 Units (has no administration in time range)  etomidate (AMIDATE) injection (20 mg Intravenous Given 09/09/18 0035)  succinylcholine (ANECTINE) injection (150 mg Intravenous Given 09/09/18 0035)  fentaNYL (SUBLIMAZE) injection 100 mcg (100 mcg Intravenous Given 09/09/18 0109)  midazolam (VERSED) injection 2 mg (2 mg Intravenous Given 09/09/18 0109)  ondansetron (ZOFRAN) injection 4 mg (4 mg Intravenous Given 09/09/18 0118)  sodium chloride 0.9 % bolus 1,000 mL (0 mLs Intravenous Stopped 09/09/18 0203)    Mobility non-ambulatory High fall risk   Focused Assessments -   R Recommendations: See Admitting Provider Note  Report given to:   Additional Notes:

## 2018-09-09 NOTE — ED Notes (Signed)
Soft restraints applied at this time

## 2018-09-09 NOTE — ED Notes (Signed)
Called [facility] for a possible transfer---Katie Guzman

## 2018-09-09 NOTE — ED Provider Notes (Signed)
BP 95/60   Pulse (!) 107   Temp (!) 97.1 F (36.2 C) (Rectal)   Resp 20   Ht 1.575 m (5\' 2" )   Wt 49.9 kg   SpO2 100%   BMI 20.12 kg/m  Discussed with Dr. Geralynn Rile at [facility] ICU.  Unfortunately there are no beds.  She will stay at [facility] and if beds are available at [facility] to she will be transferred   Ripley Fraise, MD 09/09/18 [phone]

## 2018-09-09 NOTE — Progress Notes (Signed)
Sputum culture collected and sent to the lab. 

## 2018-09-09 NOTE — Discharge Summary (Signed)
  Patient was extubated.  She is extremely upset feeling that we have accused her of being a drug addict because I informed her that her tox screen is positive for cocaine, narcotics and THC.  She started behaving very inappropriately urinating in the trash can and using inappropriate language.  She stated that this was not a suicide attempt and she wishes to leave AMA.  I informed her that she is risking her that her life would be at risk if she leaves so close to extubation but she insisted on leaving.  Since we will not IVC her then will allow to leave AMA.  This is our discharge summary  Rush Farmer, M.D. [facility] Pulmonary/Critical Care Medicine. Pager: [phone]. After hours pager: [phone].

## 2018-09-09 NOTE — ED Notes (Signed)
EDP at bedside. Daughter at bedside

## 2018-09-09 NOTE — ED Notes (Signed)
Patient's daughter returned to bedside from going outside to speak with her sister. Daughter was on a video chat with someone, and was showing the person on video chat the patient. This tech left the room to inform EDP daughter had returned to bedside. This tech was walking past the room after informing provider, and heard daughter screaming and yelling in the room, staff including EDP entered room and daughter was informed she needed to leave, security was called. Daughter stated she would leave in just a second she needed to get her "mom's things" daughter was walking around in the room gathering up things and asking where the pt's belongings were. Staff informed daughter that all of the belongings were in a patient belonging bag in patient's room. Daughter was still on video chat and then proceeded to exit after walking the room several times. Security was still not present until daughter was out of the room and walking down the hall leaving.

## 2018-09-09 NOTE — ED Notes (Signed)
Pt daughter left with pts belongings and money at bedside

## 2018-09-09 NOTE — Progress Notes (Signed)
Pt signed AMA paperwork in front of this RN. Paperwork placed in patients chart. Only belongings in room where one shoe and one ortho shoe. Both returned to patient.

## 2018-09-10 LAB — URINE CULTURE: Culture: NO GROWTH

## 2018-09-11 LAB — CULTURE, RESPIRATORY W GRAM STAIN: Culture: NORMAL

## 2018-09-14 LAB — CULTURE, BLOOD (ROUTINE X 2)
Culture: NO GROWTH
Culture: NO GROWTH
Special Requests: ADEQUATE
Special Requests: ADEQUATE

## 2018-09-18 ENCOUNTER — Inpatient Hospital Stay
Admission: EM | Admit: 2018-09-18 | Discharge: 2018-09-22 | DRG: 146 | Disposition: A | Discharge status: Home Health | Payer: Medicaid Other | Attending: Internal Medicine | Admitting: Internal Medicine

## 2018-09-18 ENCOUNTER — Encounter: Payer: Self-pay | Admitting: Emergency Medicine

## 2018-09-18 ENCOUNTER — Emergency Department: Payer: Medicaid Other

## 2018-09-18 ENCOUNTER — Other Ambulatory Visit: Payer: Self-pay

## 2018-09-18 DIAGNOSIS — J9602 Acute respiratory failure with hypercapnia: Secondary | ICD-10-CM | POA: Diagnosis present

## 2018-09-18 DIAGNOSIS — C329 Malignant neoplasm of larynx, unspecified: Secondary | ICD-10-CM | POA: Diagnosis present

## 2018-09-18 DIAGNOSIS — F17210 Nicotine dependence, cigarettes, uncomplicated: Secondary | ICD-10-CM | POA: Diagnosis present

## 2018-09-18 DIAGNOSIS — Z20828 Contact with and (suspected) exposure to other viral communicable diseases: Secondary | ICD-10-CM | POA: Diagnosis present

## 2018-09-18 DIAGNOSIS — Z801 Family history of malignant neoplasm of trachea, bronchus and lung: Secondary | ICD-10-CM

## 2018-09-18 DIAGNOSIS — Z888 Allergy status to other drugs, medicaments and biological substances status: Secondary | ICD-10-CM

## 2018-09-18 DIAGNOSIS — E1140 Type 2 diabetes mellitus with diabetic neuropathy, unspecified: Secondary | ICD-10-CM | POA: Diagnosis present

## 2018-09-18 DIAGNOSIS — J9601 Acute respiratory failure with hypoxia: Secondary | ICD-10-CM | POA: Diagnosis present

## 2018-09-18 DIAGNOSIS — Z923 Personal history of irradiation: Secondary | ICD-10-CM | POA: Diagnosis not present

## 2018-09-18 DIAGNOSIS — Z794 Long term (current) use of insulin: Secondary | ICD-10-CM

## 2018-09-18 DIAGNOSIS — K219 Gastro-esophageal reflux disease without esophagitis: Secondary | ICD-10-CM | POA: Diagnosis present

## 2018-09-18 DIAGNOSIS — J9600 Acute respiratory failure, unspecified whether with hypoxia or hypercapnia: Secondary | ICD-10-CM | POA: Diagnosis present

## 2018-09-18 DIAGNOSIS — M797 Fibromyalgia: Secondary | ICD-10-CM | POA: Diagnosis present

## 2018-09-18 DIAGNOSIS — Z7951 Long term (current) use of inhaled steroids: Secondary | ICD-10-CM | POA: Diagnosis not present

## 2018-09-18 DIAGNOSIS — J440 Chronic obstructive pulmonary disease with acute lower respiratory infection: Secondary | ICD-10-CM | POA: Diagnosis present

## 2018-09-18 DIAGNOSIS — J189 Pneumonia, unspecified organism: Secondary | ICD-10-CM | POA: Diagnosis present

## 2018-09-18 DIAGNOSIS — J441 Chronic obstructive pulmonary disease with (acute) exacerbation: Secondary | ICD-10-CM | POA: Diagnosis present

## 2018-09-18 DIAGNOSIS — T380X5A Adverse effect of glucocorticoids and synthetic analogues, initial encounter: Secondary | ICD-10-CM | POA: Diagnosis present

## 2018-09-18 DIAGNOSIS — R0602 Shortness of breath: Secondary | ICD-10-CM

## 2018-09-18 DIAGNOSIS — Z885 Allergy status to narcotic agent status: Secondary | ICD-10-CM | POA: Diagnosis not present

## 2018-09-18 DIAGNOSIS — R0689 Other abnormalities of breathing: Secondary | ICD-10-CM

## 2018-09-18 DIAGNOSIS — Z79899 Other long term (current) drug therapy: Secondary | ICD-10-CM

## 2018-09-18 DIAGNOSIS — Z791 Long term (current) use of non-steroidal anti-inflammatories (NSAID): Secondary | ICD-10-CM

## 2018-09-18 DIAGNOSIS — Z89421 Acquired absence of other right toe(s): Secondary | ICD-10-CM | POA: Diagnosis not present

## 2018-09-18 LAB — CBC WITH DIFFERENTIAL/PLATELET
Abs Immature Granulocytes: 0.04 10*3/uL (ref 0.00–0.07)
Basophils Absolute: 0 10*3/uL (ref 0.0–0.1)
Basophils Relative: 0 %
Eosinophils Absolute: 0.3 10*3/uL (ref 0.0–0.5)
Eosinophils Relative: 3 %
HCT: 40.7 % (ref 36.0–46.0)
Hemoglobin: 12.5 g/dL (ref 12.0–15.0)
Immature Granulocytes: 0 %
Lymphocytes Relative: 6 %
Lymphs Abs: 0.6 10*3/uL — ABNORMAL LOW (ref 0.7–4.0)
MCH: 27.2 pg (ref 26.0–34.0)
MCHC: 30.7 g/dL (ref 30.0–36.0)
MCV: 88.5 fL (ref 80.0–100.0)
Monocytes Absolute: 0.8 10*3/uL (ref 0.1–1.0)
Monocytes Relative: 7 %
Neutro Abs: 8.9 10*3/uL — ABNORMAL HIGH (ref 1.7–7.7)
Neutrophils Relative %: 84 %
Platelets: 524 10*3/uL — ABNORMAL HIGH (ref 150–400)
RBC: 4.6 MIL/uL (ref 3.87–5.11)
RDW: 14.3 % (ref 11.5–15.5)
WBC: 10.6 10*3/uL — ABNORMAL HIGH (ref 4.0–10.5)
nRBC: 0 % (ref 0.0–0.2)

## 2018-09-18 LAB — PROCALCITONIN: Procalcitonin: <0.1 ng/mL

## 2018-09-18 LAB — URINALYSIS, COMPLETE (UACMP) WITH MICROSCOPIC
Bilirubin Urine: NEGATIVE
Glucose, UA: 150 mg/dL — AB
Hgb urine dipstick: NEGATIVE
Ketones, ur: NEGATIVE mg/dL
Leukocytes,Ua: NEGATIVE
Nitrite: NEGATIVE
Protein, ur: NEGATIVE mg/dL
Specific Gravity, Urine: 1.008 (ref 1.005–1.030)
pH: 7 (ref 5.0–8.0)

## 2018-09-18 LAB — BASIC METABOLIC PANEL
Anion gap: 7 (ref 5–15)
BUN: 10 mg/dL (ref 6–20)
CO2: 35 mmol/L — ABNORMAL HIGH (ref 22–32)
Calcium: 8.7 mg/dL — ABNORMAL LOW (ref 8.9–10.3)
Chloride: 92 mmol/L — ABNORMAL LOW (ref 98–111)
Creatinine, Ser: 0.44 mg/dL (ref 0.44–1.00)
GFR calc Af Amer: >60 mL/min (ref >60)
GFR calc non Af Amer: >60 mL/min (ref >60)
Glucose, Bld: 207 mg/dL — ABNORMAL HIGH (ref 70–99)
Potassium: 3.8 mmol/L (ref 3.5–5.1)
Sodium: 134 mmol/L — ABNORMAL LOW (ref 135–145)

## 2018-09-18 LAB — URINE DRUG SCREEN, QUALITATIVE (ARMC ONLY)
Amphetamines, Ur Screen: NOT DETECTED
Barbiturates, Ur Screen: NOT DETECTED
Benzodiazepine, Ur Scrn: NOT DETECTED
Cannabinoid 50 Ng, Ur ~~LOC~~: NOT DETECTED
Cocaine Metabolite,Ur ~~LOC~~: NOT DETECTED
MDMA (Ecstasy)Ur Screen: NOT DETECTED
Methadone Scn, Ur: NOT DETECTED
Opiate, Ur Screen: NOT DETECTED
Phencyclidine (PCP) Ur S: NOT DETECTED
Tricyclic, Ur Screen: NOT DETECTED

## 2018-09-18 LAB — HEPATIC FUNCTION PANEL
ALT: 10 U/L (ref 0–44)
AST: 13 U/L — ABNORMAL LOW (ref 15–41)
Albumin: 3.4 g/dL — ABNORMAL LOW (ref 3.5–5.0)
Alkaline Phosphatase: 82 U/L (ref 38–126)
Bilirubin, Direct: <0.1 mg/dL (ref 0.0–0.2)
Total Bilirubin: 0.2 mg/dL — ABNORMAL LOW (ref 0.3–1.2)
Total Protein: 7 g/dL (ref 6.5–8.1)

## 2018-09-18 LAB — MRSA PCR SCREENING: MRSA by PCR: NEGATIVE

## 2018-09-18 LAB — BLOOD GAS, VENOUS
Acid-Base Excess: 13.4 mmol/L — ABNORMAL HIGH (ref 0.0–2.0)
Acid-Base Excess: 12.5 mmol/L — ABNORMAL HIGH (ref 0.0–2.0)
Bicarbonate: 43.2 mmol/L — ABNORMAL HIGH (ref 20.0–28.0)
Bicarbonate: 41 mmol/L — ABNORMAL HIGH (ref 20.0–28.0)
O2 Saturation: 82.1 %
O2 Saturation: 85.3 %
Patient temperature: 37
Patient temperature: 37
pCO2, Ven: 82 mmHg (ref 44.0–60.0)
pCO2, Ven: 71 mmHg (ref 44.0–60.0)
pH, Ven: 7.33 (ref 7.250–7.430)
pH, Ven: 7.37 (ref 7.250–7.430)
pO2, Ven: 50 mmHg — ABNORMAL HIGH (ref 32.0–45.0)
pO2, Ven: 52 mmHg — ABNORMAL HIGH (ref 32.0–45.0)

## 2018-09-18 LAB — GLUCOSE, CAPILLARY
Glucose-Capillary: 201 mg/dL — ABNORMAL HIGH (ref 70–99)
Glucose-Capillary: 275 mg/dL — ABNORMAL HIGH (ref 70–99)
Glucose-Capillary: 287 mg/dL — ABNORMAL HIGH (ref 70–99)
Glucose-Capillary: 411 mg/dL — ABNORMAL HIGH (ref 70–99)
Glucose-Capillary: 493 mg/dL — ABNORMAL HIGH (ref 70–99)

## 2018-09-18 LAB — BRAIN NATRIURETIC PEPTIDE: B Natriuretic Peptide: 191 pg/mL — ABNORMAL HIGH (ref 0.0–100.0)

## 2018-09-18 LAB — LACTIC ACID, PLASMA: Lactic Acid, Venous: 0.8 mmol/L (ref 0.5–1.9)

## 2018-09-18 LAB — SARS CORONAVIRUS 2 BY RT PCR (HOSPITAL ORDER, PERFORMED IN ~~LOC~~ HOSPITAL LAB): SARS Coronavirus 2: NEGATIVE

## 2018-09-18 LAB — TROPONIN I (HIGH SENSITIVITY): Troponin I (High Sensitivity): 7 ng/L (ref <18)

## 2018-09-18 MED ORDER — IPRATROPIUM-ALBUTEROL 0.5-2.5 (3) MG/3ML IN SOLN
3.0000 mL | Freq: Once | RESPIRATORY_TRACT | Status: AC
  Administered 2018-09-18: 3 mL via RESPIRATORY_TRACT
  Filled 2018-09-18: qty 3

## 2018-09-18 MED ORDER — ONDANSETRON HCL 4 MG/2ML IJ SOLN: 4.0000 mg | Freq: Four times a day (QID) | INTRAMUSCULAR | Status: DC | PRN

## 2018-09-18 MED ORDER — VANCOMYCIN HCL IN DEXTROSE 1-5 GM/200ML-% IV SOLN
1000.0000 mg | Freq: Once | INTRAVENOUS | Status: AC
  Administered 2018-09-18: 1000 mg via INTRAVENOUS
  Filled 2018-09-18: qty 200

## 2018-09-18 MED ORDER — IBUPROFEN 400 MG PO TABS
800.0000 mg | ORAL_TABLET | Freq: Four times a day (QID) | ORAL | Status: DC | PRN
  Administered 2018-09-18 – 2018-09-19 (×3): 800 mg via ORAL
  Filled 2018-09-18 (×3): qty 2

## 2018-09-18 MED ORDER — INSULIN ASPART 100 UNIT/ML ~~LOC~~ SOLN
0.0000 [IU] | Freq: Three times a day (TID) | SUBCUTANEOUS | Status: DC
  Administered 2018-09-18: 12:00:00 8 [IU] via SUBCUTANEOUS
  Administered 2018-09-18: 5 [IU] via SUBCUTANEOUS
  Filled 2018-09-18 (×3): qty 1

## 2018-09-18 MED ORDER — ALBUTEROL SULFATE (2.5 MG/3ML) 0.083% IN NEBU
2.5000 mg | INHALATION_SOLUTION | RESPIRATORY_TRACT | Status: DC | PRN
  Administered 2018-09-19: 2.5 mg via RESPIRATORY_TRACT
  Filled 2018-09-18: qty 3

## 2018-09-18 MED ORDER — FLUTICASONE PROPIONATE 50 MCG/ACT NA SUSP
1.0000 | Freq: Every day | NASAL | Status: DC
  Administered 2018-09-19 – 2018-09-22 (×4): 1 via NASAL
  Filled 2018-09-18: qty 16

## 2018-09-18 MED ORDER — METHYLPREDNISOLONE SODIUM SUCC 125 MG IJ SOLR
125.0000 mg | Freq: Once | INTRAMUSCULAR | Status: AC
  Administered 2018-09-18: 125 mg via INTRAVENOUS

## 2018-09-18 MED ORDER — IPRATROPIUM BROMIDE 0.02 % IN SOLN: 0.5000 mg | RESPIRATORY_TRACT | Status: DC

## 2018-09-18 MED ORDER — SODIUM CHLORIDE 0.9 % IV SOLN
2.0000 g | Freq: Once | INTRAVENOUS | Status: AC
  Administered 2018-09-18: 2 g via INTRAVENOUS
  Filled 2018-09-18: qty 2

## 2018-09-18 MED ORDER — ENOXAPARIN SODIUM 40 MG/0.4ML ~~LOC~~ SOLN
40.0000 mg | SUBCUTANEOUS | Status: DC
  Administered 2018-09-18 – 2018-09-21 (×4): 40 mg via SUBCUTANEOUS
  Filled 2018-09-18 (×4): qty 0.4

## 2018-09-18 MED ORDER — SODIUM CHLORIDE 0.9 % IV SOLN
2.0000 g | Freq: Three times a day (TID) | INTRAVENOUS | Status: DC
  Administered 2018-09-18 – 2018-09-22 (×12): 2 g via INTRAVENOUS
  Filled 2018-09-18 (×19): qty 2

## 2018-09-18 MED ORDER — GABAPENTIN 800 MG PO TABS
800.0000 mg | ORAL_TABLET | Freq: Three times a day (TID) | ORAL | Status: DC
  Filled 2018-09-18 (×2): qty 1

## 2018-09-18 MED ORDER — METHYLPREDNISOLONE SODIUM SUCC 125 MG IJ SOLR
60.0000 mg | Freq: Four times a day (QID) | INTRAMUSCULAR | Status: DC
  Administered 2018-09-18 – 2018-09-19 (×4): 60 mg via INTRAVENOUS
  Filled 2018-09-18 (×5): qty 2

## 2018-09-18 MED ORDER — MORPHINE SULFATE 15 MG PO TABS
30.0000 mg | ORAL_TABLET | ORAL | Status: AC | PRN
  Administered 2018-09-19 (×3): 30 mg via ORAL
  Filled 2018-09-18: qty 2
  Filled 2018-09-18: qty 1
  Filled 2018-09-18 (×2): qty 2
  Filled 2018-09-18: qty 1

## 2018-09-18 MED ORDER — VANCOMYCIN HCL IN DEXTROSE 1-5 GM/200ML-% IV SOLN
1000.0000 mg | INTRAVENOUS | Status: DC
  Administered 2018-09-19: 09:00:00 1000 mg via INTRAVENOUS
  Filled 2018-09-18 (×2): qty 200

## 2018-09-18 MED ORDER — IPRATROPIUM-ALBUTEROL 0.5-2.5 (3) MG/3ML IN SOLN
3.0000 mL | Freq: Four times a day (QID) | RESPIRATORY_TRACT | Status: DC
  Administered 2018-09-18 – 2018-09-20 (×6): 3 mL via RESPIRATORY_TRACT
  Filled 2018-09-18 (×7): qty 3

## 2018-09-18 MED ORDER — ONDANSETRON HCL 4 MG PO TABS: 4.0000 mg | ORAL_TABLET | Freq: Four times a day (QID) | ORAL | Status: DC | PRN

## 2018-09-18 MED ORDER — IPRATROPIUM BROMIDE 0.02 % IN SOLN
0.5000 mg | RESPIRATORY_TRACT | Status: AC
  Administered 2018-09-18: 0.5 mg via RESPIRATORY_TRACT
  Filled 2018-09-18: qty 2.5

## 2018-09-18 MED ORDER — ALBUTEROL SULFATE (2.5 MG/3ML) 0.083% IN NEBU: 5.0000 mg | INHALATION_SOLUTION | Freq: Once | RESPIRATORY_TRACT | Status: DC

## 2018-09-18 MED ORDER — GABAPENTIN 400 MG PO CAPS
800.0000 mg | ORAL_CAPSULE | Freq: Three times a day (TID) | ORAL | Status: DC
  Administered 2018-09-18 – 2018-09-22 (×12): 800 mg via ORAL
  Filled 2018-09-18 (×12): qty 2

## 2018-09-18 MED ORDER — ALBUTEROL SULFATE (2.5 MG/3ML) 0.083% IN NEBU
2.5000 mg | INHALATION_SOLUTION | Freq: Once | RESPIRATORY_TRACT | Status: AC
  Administered 2018-09-18: 2.5 mg via RESPIRATORY_TRACT
  Filled 2018-09-18: qty 3

## 2018-09-18 MED ORDER — ALBUTEROL SULFATE (2.5 MG/3ML) 0.083% IN NEBU
5.0000 mg | INHALATION_SOLUTION | Freq: Once | RESPIRATORY_TRACT | Status: AC
  Administered 2018-09-18: 5 mg via RESPIRATORY_TRACT
  Filled 2018-09-18: qty 6

## 2018-09-18 MED ORDER — PANTOPRAZOLE SODIUM 40 MG IV SOLR
40.0000 mg | INTRAVENOUS | Status: DC
  Administered 2018-09-18 – 2018-09-20 (×3): 40 mg via INTRAVENOUS
  Filled 2018-09-18 (×4): qty 40

## 2018-09-18 MED ORDER — INSULIN GLARGINE 100 UNIT/ML ~~LOC~~ SOLN
10.0000 [IU] | Freq: Every day | SUBCUTANEOUS | Status: DC
  Administered 2018-09-18: 10 [IU] via SUBCUTANEOUS
  Filled 2018-09-18: qty 0.1

## 2018-09-18 NOTE — Progress Notes (Signed)
Pt taken off bipap. Doing well off with no respiratory distress. Resting comfortably on roomair. sats 98%, respiratory rate 18/min. Will continue to monitor, RN aware.

## 2018-09-18 NOTE — Progress Notes (Signed)
PHARMACY -  BRIEF ANTIBIOTIC NOTE   Pharmacy has received consult(s) for Vancomycin and Cefepime from an ED provider.  The patient's profile has been reviewed for ht/wt/allergies/indication/available labs.    One time order(s) placed for Vancomycin and Cefepime by ED provider  Further antibiotics/pharmacy consults should be ordered by admitting physician if indicated.                       Paulina Fusi, PharmD, BCPS 09/18/2018 7:33 AM

## 2018-09-18 NOTE — Consult Note (Signed)
Name: Katie Guzman MRN: [id number] DOB: Apr 06, 1970     CONSULTATION DATE: 09/18/2018  REFERRING MD : gouru  CHIEF COMPLAINT:  resp failure  HISTORY OF PRESENT ILLNESS: 48 yo WF with h/o COCAINE ABUSE Was admitted last week for severe resp failure from COPD exacerbation Patient left AMA at [facility]  At this time, presented with severe SOB and increased WOB and placed on biPAP Now obtunded  High risk for intubation   PAST MEDICAL HISTORY :   has a past medical history of Asthma, Cancer ([place]), Cellulitis (04/15/2015), COPD (chronic obstructive pulmonary disease) ([place]), Diabetes mellitus, Emphysema, Fibromyalgia, Neuropathy, and Tenosynovitis of foot (04/16/2015).  has a past surgical history that includes Back surgery; Tubal ligation; Cesarean section; Tonsillectomy; Wound debridement (Right, 08/19/2018); Amputation toe (Right, 08/19/2018); and Amputation (Right, 08/19/2018). Prior to Admission medications   Medication Sig Start Date End Date Taking? Authorizing Provider  acetaminophen (TYLENOL) 325 MG tablet Take 650 mg by mouth every 6 (six) hours as needed for mild pain.    [provider]  albuterol (PROVENTIL HFA;VENTOLIN HFA) 108 (90 BASE) MCG/ACT inhaler Inhale 2 puffs into the lungs every 4 (four) hours as needed for wheezing or shortness of breath. For wheeze or shortness of breath Patient taking differently: Inhale 2 puffs into the lungs 2 (two) times a day. For wheeze or shortness of breath 10/23/14   Gouru, Illene Silver, MD  albuterol (PROVENTIL) (2.5 MG/3ML) 0.083% nebulizer solution Take 2.5 mg by nebulization every 4 (four) hours as needed for wheezing or shortness of breath.     [provider]  ascorbic acid (VITAMIN C) 250 MG tablet Take 1 tablet (250 mg total) by mouth 2 (two) times daily. 07/31/18   Vaughan Basta, MD  escitalopram (LEXAPRO) 10 MG tablet Take 20 mg by mouth every morning. 07/27/18 01/23/19  [provider]  fluticasone (FLONASE)  50 MCG/ACT nasal spray Place 1 spray into both nostrils daily. 07/10/16   [provider]  Fluticasone-Salmeterol (ADVAIR) 250-50 MCG/DOSE AEPB Inhale 1 puff into the lungs every 12 (twelve) hours. 10/26/16   [provider]  gabapentin (NEURONTIN) 800 MG tablet Take 1 tablet (800 mg total) by mouth 3 (three) times daily. 04/01/17   Edrick Kins, DPM  glycerin adult 2 g suppository Place 1 suppository rectally as needed for constipation. 09/04/18   Merlyn Lot, MD  ibuprofen (ADVIL) 800 MG tablet Take 800 mg by mouth every 6 (six) hours as needed for fever or mild pain.     [provider]  insulin glargine (LANTUS) 100 UNIT/ML injection Inject 0.25 mLs (25 Units total) into the skin at bedtime. 07/31/18   Vaughan Basta, MD  insulin lispro (HUMALOG) 100 UNIT/ML injection Inject 1-18 Units into the skin 4 (four) times daily -  before meals and at bedtime. Sliding scale  07/11/16 08/24/18  [provider]  lidocaine (LIDODERM) 5 % Place 1 patch onto the skin daily as needed. For pain. Remove & Discard patch within 12 hours or as directed by MD    [provider]  nicotine (NICODERM CQ - DOSED IN MG/24 HOURS) 21 mg/24hr patch Place 1 patch (21 mg total) onto the skin daily. Patient taking differently: Place 21 mg onto the skin daily as needed (smoking cessation).  10/23/14   Gouru, Illene Silver, MD  polyethylene glycol (MIRALAX / GLYCOLAX) 17 g packet Take 17 g by mouth daily. Mix one tablespoon with 8oz of your favorite juice or water every day until you are having  soft formed stools. 09/04/18   Merlyn Lot, MD  senna-docusate (SENOKOT-S) 8.6-50 MG tablet Take 1 tablet by mouth daily. 04/13/18 04/13/19  [provider]  tiotropium (SPIRIVA) 18 MCG inhalation capsule Place 1 capsule (18 mcg total) into inhaler and inhale daily. 10/23/14   Nicholes Mango, MD   Allergies  Allergen Reactions   Flexeril [Cyclobenzaprine] Hives and Nausea And Vomiting    Hydrocodone Hives   Other Hives   Darvocet [Propoxyphene N-Acetaminophen] Hives and Nausea And Vomiting   Fish-Derived Products Other (See Comments)    Patient does not like to eat fish. Not allergic but prefers not to.   Propofol Nausea And Vomiting   Toradol [Ketorolac Tromethamine] Nausea And Vomiting   Vicodin [Hydrocodone-Acetaminophen] Hives and Nausea And Vomiting   Ketorolac Nausea And Vomiting   Tramadol Nausea And Vomiting    Vomiting only    FAMILY HISTORY:  family history includes Lung cancer in her mother. SOCIAL HISTORY:  reports that she has been smoking cigarettes. She has a 15.00 pack-year smoking history. She has never used smokeless tobacco. She reports that she does not drink alcohol or use drugs.  REVIEW OF SYSTEMS:   Unable to obtain due to critical illness   VITAL SIGNS: Temp:  [97.2 F (36.2 C)-97.8 F (36.6 C)] 97.2 F (36.2 C) (08/09 0900) Pulse Rate:  [89-94] 92 (08/09 0900) Resp:  [14-22] 14 (08/09 0900) BP: (142-169)/(79-103) 142/79 (08/09 0900) SpO2:  [100 %] 100 % (08/09 0900) Weight:  [45.4 kg-51.9 kg] 51.9 kg (08/09 0900)   No intake/output data recorded. Total I/O In: 300 [IV Piggyback:300] Out: -    SpO2: 100 % O2 Flow Rate (L/min): 2 L/min   Physical Examination:  GENERAL:critically ill appearing, +resp distress HEAD: Normocephalic, atraumatic.  EYES: Pupils equal, round, reactive to light.  No scleral icterus.  MOUTH: Moist mucosal membrane. NECK: Supple. No JVD.  PULMONARY: +rhonchi, +wheezing CARDIOVASCULAR: S1 and S2. Regular rate and rhythm. No murmurs, rubs, or gallops.  GASTROINTESTINAL: Soft, nontender, -distended. No masses. Positive bowel sounds. No hepatosplenomegaly.  MUSCULOSKELETAL: No swelling, clubbing, or edema.  NEUROLOGIC: obtunded SKIN:intact,warm,dry    MEDICATIONS: I have reviewed all medications and confirmed regimen as documented   CULTURE RESULTS   Recent Results (from the past 240  hour(s))  SARS Coronavirus 2 (CEPHEID - Performed in [place] hospital lab), Hosp Order     Status: None   Collection Time: 09/08/18 11:49 PM   Specimen: Nasopharyngeal Swab  Result Value Ref Range Status   SARS Coronavirus 2 NEGATIVE NEGATIVE Final    Comment: (NOTE) If result is NEGATIVE SARS-CoV-2 target nucleic acids are NOT DETECTED. The SARS-CoV-2 RNA is generally detectable in upper and lower  respiratory specimens during the acute phase of infection. The lowest  concentration of SARS-CoV-2 viral copies this assay can detect is 250  copies / mL. A negative result does not preclude SARS-CoV-2 infection  and should not be used as the sole basis for treatment or other  patient management decisions.  A negative result may occur with  improper specimen collection / handling, submission of specimen other  than nasopharyngeal swab, presence of viral mutation(s) within the  areas targeted by this assay, and inadequate number of viral copies  (<250 copies / mL). A negative result must be combined with clinical  observations, patient history, and epidemiological information. If result is POSITIVE SARS-CoV-2 target nucleic acids are DETECTED. The SARS-CoV-2 RNA is generally detectable in upper and lower  respiratory specimens dur ing  the acute phase of infection.  Positive  results are indicative of active infection with SARS-CoV-2.  Clinical  correlation with patient history and other diagnostic information is  necessary to determine patient infection status.  Positive results do  not rule out bacterial infection or co-infection with other viruses. If result is PRESUMPTIVE POSTIVE SARS-CoV-2 nucleic acids MAY BE PRESENT.   A presumptive positive result was obtained on the submitted specimen  and confirmed on repeat testing.  While 2019 novel coronavirus  (SARS-CoV-2) nucleic acids may be present in the submitted sample  additional confirmatory testing may be necessary for  epidemiological  and / or clinical management purposes  to differentiate between  SARS-CoV-2 and other Sarbecovirus currently known to infect humans.  If clinically indicated additional testing with an alternate test  methodology [phone]) is advised. The SARS-CoV-2 RNA is generally  detectable in upper and lower respiratory sp ecimens during the acute  phase of infection. The expected result is Negative. Fact Sheet for Patients:  StrictlyIdeas.no Fact Sheet for Healthcare Providers: BankingDealers.co.za This test is not yet approved or cleared by the [place] FDA and has been authorized for detection and/or diagnosis of SARS-CoV-2 by FDA under an Emergency Use Authorization (EUA).  This EUA will remain in effect (meaning this test can be used) for the duration of the COVID-19 declaration under Section 564(b)(1) of the Act, 21 U.S.C. section 360bbb-3(b)(1), unless the authorization is terminated or revoked sooner. Performed at [facility], [place] [address], [place], [place] [address]   Blood Culture (routine x 2)     Status: None   Collection Time: 09/09/18  2:02 AM   Specimen: BLOOD  Result Value Ref Range Status   Specimen Description BLOOD RIGHT ANTECUBITAL  Final   Special Requests   Final    BOTTLES DRAWN AEROBIC AND ANAEROBIC Blood Culture adequate volume   Culture   Final    NO GROWTH 5 DAYS Performed at [facility], [place] [address] [place], [place] [address]    Report Status 09/14/2018 FINAL  Final  Blood Culture (routine x 2)     Status: None   Collection Time: 09/09/18  2:15 AM   Specimen: BLOOD LEFT HAND  Result Value Ref Range Status   Specimen Description BLOOD LEFT HAND  Final   Special Requests   Final    BOTTLES DRAWN AEROBIC AND ANAEROBIC Blood Culture adequate volume   Culture   Final    NO GROWTH 5 DAYS Performed at [facility], [place] [address], [place], [place] [address]    Report  Status 09/14/2018 FINAL  Final  Urine culture     Status: None   Collection Time: 09/09/18  2:25 AM   Specimen: In/Out Cath Urine  Result Value Ref Range Status   Specimen Description IN/OUT CATH URINE  Final   Special Requests NONE  Final   Culture   Final    NO GROWTH Performed at [facility], [place] [address], [place], [place] [address]    Report Status 09/10/2018 FINAL  Final  Culture, respiratory (non-expectorated)     Status: None   Collection Time: 09/09/18  4:13 AM   Specimen: Tracheal Aspirate; Respiratory  Result Value Ref Range Status   Specimen Description TRACHEAL ASPIRATE  Final   Special Requests NONE  Final   Gram Stain   Final    RARE WBC PRESENT, PREDOMINANTLY PMN MODERATE YEAST FEW GRAM POSITIVE RODS RARE GRAM NEGATIVE RODS  Culture   Final    Consistent with normal respiratory flora. Performed at [facility], [place] [address], [place], [place] [address]    Report Status 09/11/2018 FINAL  Final  SARS Coronavirus 2 [facility] order, Performed in [facility] hospital lab) Nasopharyngeal Nasopharyngeal Swab     Status: None   Collection Time: 09/18/18  6:46 AM   Specimen: Nasopharyngeal Swab  Result Value Ref Range Status   SARS Coronavirus 2 NEGATIVE NEGATIVE Final    Comment: (NOTE) If result is NEGATIVE SARS-CoV-2 target nucleic acids are NOT DETECTED. The SARS-CoV-2 RNA is generally detectable in upper and lower  respiratory specimens during the acute phase of infection. The lowest  concentration of SARS-CoV-2 viral copies this assay can detect is 250  copies / mL. A negative result does not preclude SARS-CoV-2 infection  and should not be used as the sole basis for treatment or other  patient management decisions.  A negative result may occur with  improper specimen collection / handling, submission of specimen other  than nasopharyngeal swab, presence of viral mutation(s) within the  areas targeted by this assay, and inadequate number of  viral copies  (<250 copies / mL). A negative result must be combined with clinical  observations, patient history, and epidemiological information. If result is POSITIVE SARS-CoV-2 target nucleic acids are DETECTED. The SARS-CoV-2 RNA is generally detectable in upper and lower  respiratory specimens dur ing the acute phase of infection.  Positive  results are indicative of active infection with SARS-CoV-2.  Clinical  correlation with patient history and other diagnostic information is  necessary to determine patient infection status.  Positive results do  not rule out bacterial infection or co-infection with other viruses. If result is PRESUMPTIVE POSTIVE SARS-CoV-2 nucleic acids MAY BE PRESENT.   A presumptive positive result was obtained on the submitted specimen  and confirmed on repeat testing.  While 2019 novel coronavirus  (SARS-CoV-2) nucleic acids may be present in the submitted sample  additional confirmatory testing may be necessary for epidemiological  and / or clinical management purposes  to differentiate between  SARS-CoV-2 and other Sarbecovirus currently known to infect humans.  If clinically indicated additional testing with an alternate test  methodology [phone]) is advised. The SARS-CoV-2 RNA is generally  detectable in upper and lower respiratory sp ecimens during the acute  phase of infection. The expected result is Negative. Fact Sheet for Patients:  StrictlyIdeas.no Fact Sheet for Healthcare Providers: BankingDealers.co.za This test is not yet approved or cleared by the [place] FDA and has been authorized for detection and/or diagnosis of SARS-CoV-2 by FDA under an Emergency Use Authorization (EUA).  This EUA will remain in effect (meaning this test can be used) for the duration of the COVID-19 declaration under Section 564(b)(1) of the Act, 21 U.S.C. section 360bbb-3(b)(1), unless the authorization is  terminated or revoked sooner. Performed at [facility], [address] [place],  [address]           IMAGING    Dg Chest Portable 1 View  Result Date: 09/18/2018 CLINICAL DATA:  Severe dyspnea.  COPD.  Laryngeal mass. EXAM: PORTABLE CHEST 1 VIEW COMPARISON:  09/09/2018 chest radiograph. FINDINGS: Stable cardiomediastinal silhouette with normal heart size. No pneumothorax. No pleural effusion. Patchy opacity in the peripheral mid to lower right lung appears new. IMPRESSION: Patchy peripheral mid to lower right lung opacity appears new, favoring pneumonia. Close chest radiograph follow-up advised. Electronically Signed   By: Ilona Sorrel  M.D.   On: 09/18/2018 07:06         ASSESSMENT AND PLAN SYNOPSIS 48 yo WF with COPD exacerbation likely from drug abuse  Severe ACUTE Hypoxic and Hypercapnic Respiratory Failure Wean off biPAP as tolerated High risk for intubation   SEVERE COPD EXACERBATION -continue IV steroids as prescribed -continue NEB THERAPY as prescribed -morphine as needed -wean fio2 as needed and tolerated   NEUROLOGY Obtunded Avoid sedatives  CARDIAC ICU monitoring  ID -continue IV abx as prescibed -follow up cultures  GI GI PROPHYLAXIS as indicated  NUTRITIONAL STATUS DIET--NPO Constipation protocol as indicated   ENDO - will use ICU hypoglycemic\Hyperglycemia protocol if needed    ELECTROLYTES -follow labs as needed -replace as needed -pharmacy consultation and following   DVT/GI PRX ordered TRANSFUSIONS AS NEEDED MONITOR FSBS ASSESS the need for LABS    Critical Care Time devoted to patient care services described in this note is 40 minutes.   Overall, patient is critically ill, prognosis is guarded.    Corrin Parker, M.D.  [facility]  Medical Director [place] Director [facility] Cardio-Pulmonary Department

## 2018-09-18 NOTE — Progress Notes (Signed)
Pt arrived to unit at this time. VSS on bipap. Pt is drowsy but wakes up and answers questions. Breathing is normal and unlabored.

## 2018-09-18 NOTE — Progress Notes (Signed)
  Pt taken off bipap. Doing well off with no respiratory distress. Resting comfortably on roomair. sats 98%, respiratory rate 18/min. Will continue to monitor  Asking for pain meds Will plan to start advil as needed and Neurontin Transfer to gen med floor

## 2018-09-18 NOTE — H&P (Signed)
[facility] NAME: Katie Guzman    MR#:  [id number]  DATE OF BIRTH:  September 16, 1970  DATE OF ADMISSION:  09/18/2018  PRIMARY CARE PHYSICIAN: [facility]   REQUESTING/REFERRING PHYSICIAN: Monks, MD  CHIEF COMPLAINT:   Shortness of breath HISTORY OF PRESENT ILLNESS:  Katie Guzman  is a 48 y.o. female with a known COPD, diabetes mellitus, neuropathy and other medical problems was extremely short of breath and as shortness of breath has been worsening EMS were called and patient was placed on nonrebreather and brought into the emergency department.  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  Patient was complaining of dyspnea and unable to read she is placed on BiPAP and hospitalist team is called admit the patient.  During my examination patient is lethargic and not answering questions  PAST MEDICAL HISTORY:   Past Medical History:  Diagnosis Date  . Asthma   . Cancer ([place])    lung  . Cellulitis 04/15/2015  . COPD (chronic obstructive pulmonary disease) ([place])   . Diabetes mellitus    Poorly controlled with complications  . Emphysema   . Fibromyalgia   . Neuropathy   . Tenosynovitis of foot 04/16/2015    PAST SURGICAL HISTOIRY:   Past Surgical History:  Procedure Laterality Date  . AMPUTATION Right 08/19/2018   Procedure: AMPUTATION RAY;  Surgeon: Sharlotte Alamo, DPM;  Location: [facility] ORS;  Service: Podiatry;  Laterality: Right;  . AMPUTATION TOE Right 08/19/2018   Procedure: AMPUTATION TOE;  Surgeon: Sharlotte Alamo, DPM;  Location: [facility] ORS;  Service: Podiatry;  Laterality: Right;  . BACK SURGERY    . CESAREAN SECTION    . TONSILLECTOMY    . TUBAL LIGATION    . WOUND DEBRIDEMENT Right 08/19/2018   Procedure: DEBRIDEMENT WOUND RIGHT FOOT, DIABETIC;  Surgeon: Sharlotte Alamo, DPM;  Location: [facility] ORS;  Service: Podiatry;  Laterality: Right;    SOCIAL HISTORY:   Social History    Tobacco Use  . Smoking status: Current Some Day Smoker    Packs/day: 0.50    Years: 30.00    Pack years: 15.00    Types: Cigarettes    Last attempt to quit: 10/05/2014    Years since quitting: 3.9  . Smokeless tobacco: Never Used  Substance Use Topics  . Alcohol use: No    FAMILY HISTORY:   Family History  Problem Relation Age of Onset  . Lung cancer Mother     DRUG ALLERGIES:   Allergies  Allergen Reactions  . Flexeril [Cyclobenzaprine] Hives and Nausea And Vomiting  . Hydrocodone Hives  . Other Hives  . Darvocet [Propoxyphene N-Acetaminophen] Hives and Nausea And Vomiting  . Fish-Derived Products Other (See Comments)    Patient does not like to eat fish. Not allergic but prefers not to.  . Propofol Nausea And Vomiting  . Toradol [Ketorolac Tromethamine] Nausea And Vomiting  . Vicodin [Hydrocodone-Acetaminophen] Hives and Nausea And Vomiting  . Ketorolac Nausea And Vomiting  . Tramadol Nausea And Vomiting    Vomiting only    REVIEW OF SYSTEMS:  Review of system unobtainable  MEDICATIONS AT HOME:   Prior to Admission medications   Medication Sig Start Date End Date Taking? Authorizing Provider  acetaminophen (TYLENOL) 325 MG tablet Take 650 mg by mouth every 6 (six) hours as needed for mild pain.    [provider]  albuterol (PROVENTIL HFA;VENTOLIN HFA) 108 (90 BASE) MCG/ACT inhaler Inhale  2 puffs into the lungs every 4 (four) hours as needed for wheezing or shortness of breath. For wheeze or shortness of breath Patient taking differently: Inhale 2 puffs into the lungs 2 (two) times a day. For wheeze or shortness of breath 10/23/14   Ruby Logiudice, Illene Silver, MD  albuterol (PROVENTIL) (2.5 MG/3ML) 0.083% nebulizer solution Take 2.5 mg by nebulization every 4 (four) hours as needed for wheezing or shortness of breath.     [provider]  ascorbic acid (VITAMIN C) 250 MG tablet Take 1 tablet (250 mg total) by mouth 2 (two) times daily. 07/31/18   Vaughan Basta, MD  escitalopram (LEXAPRO) 10 MG tablet Take 20 mg by mouth every morning. 07/27/18 01/23/19  [provider]  fluticasone (FLONASE) 50 MCG/ACT nasal spray Place 1 spray into both nostrils daily. 07/10/16   [provider]  Fluticasone-Salmeterol (ADVAIR) 250-50 MCG/DOSE AEPB Inhale 1 puff into the lungs every 12 (twelve) hours. 10/26/16   [provider]  gabapentin (NEURONTIN) 800 MG tablet Take 1 tablet (800 mg total) by mouth 3 (three) times daily. 04/01/17   Edrick Kins, DPM  glycerin adult 2 g suppository Place 1 suppository rectally as needed for constipation. 09/04/18   Merlyn Lot, MD  ibuprofen (ADVIL) 800 MG tablet Take 800 mg by mouth every 6 (six) hours as needed for fever or mild pain.     [provider]  insulin glargine (LANTUS) 100 UNIT/ML injection Inject 0.25 mLs (25 Units total) into the skin at bedtime. 07/31/18   Vaughan Basta, MD  insulin lispro (HUMALOG) 100 UNIT/ML injection Inject 1-18 Units into the skin 4 (four) times daily -  before meals and at bedtime. Sliding scale  07/11/16 08/24/18  [provider]  lidocaine (LIDODERM) 5 % Place 1 patch onto the skin daily as needed. For pain. Remove & Discard patch within 12 hours or as directed by MD    [provider]  nicotine (NICODERM CQ - DOSED IN MG/24 HOURS) 21 mg/24hr patch Place 1 patch (21 mg total) onto the skin daily. Patient taking differently: Place 21 mg onto the skin daily as needed (smoking cessation).  10/23/14   Jori Thrall, Illene Silver, MD  polyethylene glycol (MIRALAX / GLYCOLAX) 17 g packet Take 17 g by mouth daily. Mix one tablespoon with 8oz of your favorite juice or water every day until you are having soft formed stools. 09/04/18   Merlyn Lot, MD  senna-docusate (SENOKOT-S) 8.6-50 MG tablet Take 1 tablet by mouth daily. 04/13/18 04/13/19  [provider]  tiotropium (SPIRIVA) 18 MCG inhalation capsule Place 1 capsule (18 mcg total)  into inhaler and inhale daily. 10/23/14   Nicholes Mango, MD      VITAL SIGNS:  Blood pressure (!) 158/84, pulse 94, temperature (!) 97.5 F (36.4 C), temperature source Axillary, resp. rate 20, height 5' (1.524 m), weight 51.9 kg, SpO2 97 %.  PHYSICAL EXAMINATION:  GENERAL:  48 y.o.-year-old patient lying in the bed with no acute distress.  EYES: Pupils equal, round, reactive to light and accommodation. No scleral icterus. Extraocular muscles intact.  HEENT: Head atraumatic, normocephalic. Oropharynx and nasopharynx clear.  NECK:  Supple, no jugular venous distention. No thyroid enlargement, no tenderness.  LUNGS: Diminished breath sounds bilaterally, no wheezing, rales,rhonchi or crepitation. No use of accessory muscles of respiration.  CARDIOVASCULAR: S1, S2 normal. No murmurs, rubs, or gallops.  ABDOMEN: Soft, nontender, nondistended. Bowel sounds present.   EXTREMITIES: No pedal edema, cyanosis, or clubbing.  NEUROLOGIC: Lethargic  on BiPAP PSYCHIATRIC: The patient is alert and oriented x 3.  SKIN: No obvious rash, lesion, or ulcer.   LABORATORY PANEL:   CBC Recent Labs  Lab 09/18/18 0632  WBC 10.6*  HGB 12.5  HCT 40.7  PLT 524*   ------------------------------------------------------------------------------------------------------------------  Chemistries  Recent Labs  Lab 09/18/18 0632 09/18/18 0641  NA 134*  --   K 3.8  --   CL 92*  --   CO2 35*  --   GLUCOSE 207*  --   BUN 10  --   CREATININE 0.44  --   CALCIUM 8.7*  --   AST  --  13*  ALT  --  10  ALKPHOS  --  82  BILITOT  --  0.2*   ------------------------------------------------------------------------------------------------------------------  Cardiac Enzymes No results for input(s): TROPONINI in the last 168 hours. ------------------------------------------------------------------------------------------------------------------  RADIOLOGY:  Dg Chest Portable 1 View  Result Date:  09/18/2018 CLINICAL DATA:  Severe dyspnea.  COPD.  Laryngeal mass. EXAM: PORTABLE CHEST 1 VIEW COMPARISON:  09/09/2018 chest radiograph. FINDINGS: Stable cardiomediastinal silhouette with normal heart size. No pneumothorax. No pleural effusion. Patchy opacity in the peripheral mid to lower right lung appears new. IMPRESSION: Patchy peripheral mid to lower right lung opacity appears new, favoring pneumonia. Close chest radiograph follow-up advised. Electronically Signed   By: Ilona Sorrel M.D.   On: 09/18/2018 07:06    EKG:   Orders placed or performed during the hospital encounter of 09/18/18  . EKG 12-Lead  . EKG 12-Lead  . ED EKG  . ED EKG    IMPRESSION AND PLAN:    #Acute hypoxemic respiratory failure from COPD exacerbation and pneumonia Admit to stepdown unit BiPAP Bronchodilator treatments, IV Solu-Medrol Pulmonology consult placed discussed with Dr. Mortimer Fries Urine drug screen given the history of cocaine abuse   #Pneumonia We will get sputum culture and sensitivity and bronchodilator treatments and treat with IV cefepime and vancomycin  #Laryngeal cancer on radiation treatments Patient needs to follow-up with oncology after clinically improves  #Diabetes mellitus Patient being on BiPAP will provide her sliding scale insulin hold home medications N.p.o.  # substance abuse Urine drug screen  GI prophylaxis Protonix IV and DVT prophylaxis Lovenox  All the records are reviewed and case discussed with ED provider. Management plans discussed with the intensivist and RN CODE STATUS: fc   TOTAL CRITICAL CARE TIME TAKING CARE OF THIS PATIENT: 50 minutes.   Note: This dictation was prepared with Dragon dictation along with smaller phrase technology. Any transcriptional errors that result from this process are unintentional.  Nicholes Mango M.D on 09/18/2018 at 2:59 PM  Between 7am to 6pm - Pager - [phone]  After 6pm go to [email or web address] - password [email or web address] [facility]  [place]  [facility]  Office  [phone]  CC: Primary care physician; Medicine, [facility]

## 2018-09-18 NOTE — Progress Notes (Signed)
Pt transferred to RM 221 at this time. Pt is alert and oriented. VSS on 2L [place] prior to transfer. Report called to [employer] RN.

## 2018-09-18 NOTE — ED Notes (Addendum)
MD Gouru,Aruna at bedside

## 2018-09-18 NOTE — Consult Note (Addendum)
Pharmacy Antibiotic Note  Katie Guzman is a 48 y.o. female admitted on 09/18/2018 with pneumonia.  Pharmacy has been consulted for vancomycin and cefepime dosing.  Plan: Cefepime 2g q8h   Patient received vancomycin 1g (19 mg/kg) LD in the ED, plan to start vancomycin 1g q24h maintenance dose tomorrow AM Goal AUC 400-550. Expected AUC: 485.6 Css min: 10.3 SCr used: 0.8  Continue to monitor Scr. Plan to check a level in 4-5 days at steady state.   Height: 5' (152.4 cm) Weight: 114 lb 6.7 oz (51.9 kg) IBW/kg (Calculated) : 45.5  Temp (24hrs), Avg:97.5 F (36.4 C), Min:97.2 F (36.2 C), Max:97.8 F (36.6 C)  Recent Labs  Lab 09/18/18 0632  WBC 10.6*  CREATININE 0.44  LATICACIDVEN 0.8    Estimated Creatinine Clearance: 61.1 mL/min (by C-G formula based on SCr of 0.44 mg/dL).    Allergies  Allergen Reactions  . Flexeril [Cyclobenzaprine] Hives and Nausea And Vomiting  . Hydrocodone Hives  . Other Hives  . Darvocet [Propoxyphene N-Acetaminophen] Hives and Nausea And Vomiting  . Fish-Derived Products Other (See Comments)    Patient does not like to eat fish. Not allergic but prefers not to.  . Propofol Nausea And Vomiting  . Toradol [Ketorolac Tromethamine] Nausea And Vomiting  . Vicodin [Hydrocodone-Acetaminophen] Hives and Nausea And Vomiting  . Ketorolac Nausea And Vomiting  . Tramadol Nausea And Vomiting    Vomiting only    Antimicrobials this admission: Cefepime 8/9 >>  Vancomycin 8/9 >>   Dose adjustments this admission: N/A  Microbiology results: 8/9 BCx: pending 8/9 MRSA PCR: pending 8/9 COVID: negative  Thank you for allowing pharmacy to be a part of this patient's care.  Lily Resident 09/18/2018 10:36 AM

## 2018-09-18 NOTE — Progress Notes (Signed)
Patient requesting pain medication cursing. Patient was informed that she can only get ibuprofen. Patient states that she will leave. Patient states that she is going to rip out her IV. MD paged

## 2018-09-18 NOTE — Progress Notes (Addendum)
Alert and oriented patient arrived to the floor on room air - O2 sat at 93%. Patient asleep. Personal belongings include 4 rings, tank top, bra, pajama pants and a cellphone.

## 2018-09-18 NOTE — ED Provider Notes (Signed)
[facility] Emergency Department Provider Note  ____________________________________________   First MD Initiated Contact with Patient 09/18/18 [phone]     (approximate)  I have reviewed the triage vital signs and the nursing notes.  History  Chief Complaint Shortness of Breath    HPI Katie Guzman is a 48 y.o. female with history of COPD, diabetes, supraglottic/laryngeal cancer (status post radiation at [facility]) who presents the emergency department for worsening shortness of breath x 3 days.  Patient states she feels the etiology of her shortness of breath is multifactorial including her throat cancer, as well as her COPD.  She reports a previously productive cough over the last several days, though none currently.  She denies any fever.  She denies any sick contacts.  No vomiting or diarrhea.  Of note, patient was recently admitted here at the end of July, requiring intubation for opioid intoxication.  At that time she was noted to have an aspiration pneumonia, unclear if she has been continuing antibiotics at home since leaving [facility].         Past Medical Hx Past Medical History:  Diagnosis Date  . Asthma   . Cancer ([place])    lung  . Cellulitis 04/15/2015  . COPD (chronic obstructive pulmonary disease) ([place])   . Diabetes mellitus    Poorly controlled with complications  . Emphysema   . Fibromyalgia   . Neuropathy   . Tenosynovitis of foot 04/16/2015    Problem List Patient Active Problem List   Diagnosis Date Noted  . Toxic encephalopathy 09/09/2018  . Acute respiratory failure with hypercapnia ([place])   . Aspiration into airway   . Polysubstance abuse ([place])   . Osteomyelitis of ankle or foot, acute, right ([place])   . Cellulitis of right foot 08/18/2018  . Diabetic ulcer of right foot associated with diabetes mellitus due to underlying condition ([place]) 07/29/2018  . Diabetic foot infection ([place]) 03/27/2017  . Skin ulcer ([place]) 08/23/2016  .  Diabetic foot ulcer ([place]) 08/23/2016  . Diabetic ulcer of toe of left foot associated with type 2 diabetes mellitus ([place])   . 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 Tenosynovitis of foot 04/16/2015  . Hypokalemia 04/16/2015  . Cellulitis 04/15/2015  . Diabetes mellitus ([place]) 04/15/2015  . Pain syndrome, chronic 04/15/2015  . COPD (chronic obstructive pulmonary disease) ([place]) 04/15/2015  . Diabetic neuropathy ([place]) 04/15/2015  . IDDM (insulin dependent diabetes mellitus) ([place])   . COPD exacerbation ([place]) 10/19/2014    Past Surgical Hx Past Surgical History:  Procedure Laterality Date  . AMPUTATION Right 08/19/2018   Procedure: AMPUTATION RAY;  Surgeon: Sharlotte Alamo, DPM;  Location: [facility] ORS;  Service: Podiatry;  Laterality: Right;  . AMPUTATION TOE Right 08/19/2018   Procedure: AMPUTATION TOE;  Surgeon: Sharlotte Alamo, DPM;  Location: [facility] ORS;  Service: Podiatry;  Laterality: Right;  . BACK SURGERY    . CESAREAN SECTION    . TONSILLECTOMY    . TUBAL LIGATION    . WOUND DEBRIDEMENT Right 08/19/2018   Procedure: DEBRIDEMENT WOUND RIGHT FOOT, DIABETIC;  Surgeon: Sharlotte Alamo, DPM;  Location: [facility] ORS;  Service: Podiatry;  Laterality: Right;    Medications Prior to Admission medications   Medication Sig Start Date End Date Taking? Authorizing Provider  acetaminophen (TYLENOL) 325 MG tablet Take 650 mg by mouth every 6 (six) hours as needed for mild pain.    [provider]  albuterol (PROVENTIL HFA;VENTOLIN HFA) 108 (90 BASE) MCG/ACT inhaler  Inhale 2 puffs into the lungs every 4 (four) hours as needed for wheezing or shortness of breath. For wheeze or shortness of breath Patient taking differently: Inhale 2 puffs into the lungs 2 (two) times a day. For wheeze or shortness of breath 10/23/14   Gouru, Illene Silver, MD  albuterol (PROVENTIL) (2.5 MG/3ML) 0.083% nebulizer solution Take 2.5 mg by nebulization every 4 (four) hours as needed for wheezing or shortness of  breath.     [provider]  ascorbic acid (VITAMIN C) 250 MG tablet Take 1 tablet (250 mg total) by mouth 2 (two) times daily. 07/31/18   Vaughan Basta, MD  escitalopram (LEXAPRO) 10 MG tablet Take 20 mg by mouth every morning. 07/27/18 01/23/19  [provider]  fluticasone (FLONASE) 50 MCG/ACT nasal spray Place 1 spray into both nostrils daily. 07/10/16   [provider]  Fluticasone-Salmeterol (ADVAIR) 250-50 MCG/DOSE AEPB Inhale 1 puff into the lungs every 12 (twelve) hours. 10/26/16   [provider]  gabapentin (NEURONTIN) 800 MG tablet Take 1 tablet (800 mg total) by mouth 3 (three) times daily. 04/01/17   Edrick Kins, DPM  glycerin adult 2 g suppository Place 1 suppository rectally as needed for constipation. 09/04/18   Merlyn Lot, MD  ibuprofen (ADVIL) 800 MG tablet Take 800 mg by mouth every 6 (six) hours as needed for fever or mild pain.     [provider]  insulin glargine (LANTUS) 100 UNIT/ML injection Inject 0.25 mLs (25 Units total) into the skin at bedtime. 07/31/18   Vaughan Basta, MD  insulin lispro (HUMALOG) 100 UNIT/ML injection Inject 1-18 Units into the skin 4 (four) times daily -  before meals and at bedtime. Sliding scale  07/11/16 08/24/18  [provider]  lidocaine (LIDODERM) 5 % Place 1 patch onto the skin daily as needed. For pain. Remove & Discard patch within 12 hours or as directed by MD    [provider]  nicotine (NICODERM CQ - DOSED IN MG/24 HOURS) 21 mg/24hr patch Place 1 patch (21 mg total) onto the skin daily. Patient taking differently: Place 21 mg onto the skin daily as needed (smoking cessation).  10/23/14   Gouru, Illene Silver, MD  polyethylene glycol (MIRALAX / GLYCOLAX) 17 g packet Take 17 g by mouth daily. Mix one tablespoon with 8oz of your favorite juice or water every day until you are having soft formed stools. 09/04/18   Merlyn Lot, MD  senna-docusate (SENOKOT-S) 8.6-50 MG  tablet Take 1 tablet by mouth daily. 04/13/18 04/13/19  [provider]  tiotropium (SPIRIVA) 18 MCG inhalation capsule Place 1 capsule (18 mcg total) into inhaler and inhale daily. 10/23/14   Nicholes Mango, MD    Allergies Flexeril [cyclobenzaprine], Hydrocodone, Other, Darvocet [propoxyphene n-acetaminophen], Fish-derived products, Propofol, Toradol [ketorolac tromethamine], Vicodin [hydrocodone-acetaminophen], Ketorolac, and Tramadol  Family Hx Family History  Problem Relation Age of Onset  . Lung cancer Mother     Social Hx Social History   Tobacco Use  . Smoking status: Current Some Day Smoker    Packs/day: 0.50    Years: 30.00    Pack years: 15.00    Types: Cigarettes    Last attempt to quit: 10/05/2014    Years since quitting: 3.9  . Smokeless tobacco: Never Used  Substance Use Topics  . Alcohol use: No  . Drug use: No     Review of Systems  Constitutional: Negative for fever. Negative for chills. Eyes: Negative for visual changes. ENT: Negative for sore  throat. Cardiovascular: Negative for chest pain. Respiratory: Positive for shortness of breath. Gastrointestinal: Negative for abdominal pain. Negative for nausea. Negative for vomiting. Genitourinary: Negative for dysuria. Musculoskeletal: Negative for leg swelling. Skin: Negative for rash. Neurological: Negative for for headaches.   Physical Exam  Vital Signs: ED Triage Vitals  Enc Vitals Group     BP 09/18/18 0636 (!) 165/103     Pulse Rate 09/18/18 0636 91     Resp 09/18/18 0636 19     Temp 09/18/18 0636 97.8 F (36.6 C)     Temp Source 09/18/18 0636 Oral     SpO2 09/18/18 0636 100 %     Weight 09/18/18 0638 100 lb (45.4 kg)     Height 09/18/18 0638 5\' 2"  (1.575 m)     Head Circumference --      Peak Flow --      Pain Score 09/18/18 0637 9     Pain Loc --      Pain Edu? --      Excl. in [place]? --     Constitutional: Alert and oriented.  Chronically ill-appearing. Eyes: Conjunctivae clear.  Sclera anicteric. Head: Normocephalic. Atraumatic. Nose: No congestion. No rhinorrhea. Mouth/Throat: Mucous membranes are dry.  Neck: Hoarse voice, which she states is chronic.  Noisy upper airway breathing.  No stridor. Cardiovascular: Normal rate, regular rhythm. No murmurs. Extremities well perfused. Respiratory: Increased work of breathing, decreased air movement bilaterally with expiratory wheezing, coarse lung sounds at the bases. Gastrointestinal: Soft and non-tender. No distention.  Musculoskeletal: No lower extremity edema. Neurologic:  Normal speech and language. No gross focal neurologic deficits are appreciated.  Skin: Skin is warm, dry and intact. No rash noted. Psychiatric: Mood and affect are appropriate for situation.  EKG  Personally reviewed.   Rate: 90 Rhythm: Sinus Axis: Normal Intervals: Within normal limits Nonspecific ST/T wave changes. No acute ischemic changes.   Radiology  Portable chest x-ray: patchy opacities in mid and lower right lung    Procedures  Procedure(s) performed (including critical care):  .Critical Care Performed by: Lilia Pro., MD Authorized by: Lilia Pro., MD   Critical care provider statement:    Critical care time (minutes):  30   Critical care was necessary to treat or prevent imminent or life-threatening deterioration of the following conditions:  Respiratory failure   Critical care was time spent personally by me on the following activities:  Evaluation of patient's response to treatment, examination of patient, ordering and performing treatments and interventions, ordering and review of laboratory studies, ordering and review of radiographic studies, pulse oximetry, re-evaluation of patient's condition, obtaining history from patient or surrogate and review of old charts     Initial Impression / Assessment and Plan / ED Course  48 y.o. female with history of supraglottic/laryngeal cancer status post radiation  at [facility] [place] who presents to the ED for worsening shortness of breath for several days.  On exam she is chronically ill-appearing, appears short of breath but satting 100% on room air.  Hoarse voice, which she states is chronic status post radiation.  No stridor.  Decreased air movement bilaterally with wheezing.  Etiology of her shortness of breath is likely multifactorial, including her COPD + known cancer.  She has no inspiratory or expiratory stridor, lower suspicion for acute airway narrowing.  She has decreased air movement bilaterally with wheezing.  Also consider other underlying infections given her recent hospitalization as noted above.  VBG significant for hypercarbia.  Will place  on BiPAP, give nebulizer treatments.  Steroids.  X-ray concerning for right-sided pneumonia.  Will treat with broad-spectrum antibiotics given her recent hospitalization.  Work of breathing improved on BiPAP.  Will repeat VBG after nebulizer treatments to trend.  Discussed with hospitalist for admission.   Final Clinical Impression(s) / ED Diagnosis  Final diagnoses:  SOB (shortness of breath)  Pneumonia of right lung due to infectious organism, unspecified part of lung  Hypercarbia     Note:  This document was prepared using Dragon voice recognition software and may include unintentional dictation errors.   Lilia Pro., MD 09/18/18 [phone]

## 2018-09-18 NOTE — ED Triage Notes (Signed)
Pt arrives via [facility] on non-rebreather with c/o SOB. Pt is a cancer pt with mass in her throat. Pt has O2 sat of 100% on RA at this time but insists that she cannot breathe.

## 2018-09-19 LAB — CBC
HCT: 39.3 % (ref 36.0–46.0)
Hemoglobin: 12.4 g/dL (ref 12.0–15.0)
MCH: 27.3 pg (ref 26.0–34.0)
MCHC: 31.6 g/dL (ref 30.0–36.0)
MCV: 86.6 fL (ref 80.0–100.0)
Platelets: 492 10*3/uL — ABNORMAL HIGH (ref 150–400)
RBC: 4.54 MIL/uL (ref 3.87–5.11)
RDW: 14.3 % (ref 11.5–15.5)
WBC: 13.4 10*3/uL — ABNORMAL HIGH (ref 4.0–10.5)
nRBC: 0 % (ref 0.0–0.2)

## 2018-09-19 LAB — GLUCOSE, CAPILLARY
Glucose-Capillary: 454 mg/dL — ABNORMAL HIGH (ref 70–99)
Glucose-Capillary: 537 mg/dL (ref 70–99)
Glucose-Capillary: 541 mg/dL (ref 70–99)
Glucose-Capillary: 356 mg/dL — ABNORMAL HIGH (ref 70–99)
Glucose-Capillary: 343 mg/dL — ABNORMAL HIGH (ref 70–99)
Glucose-Capillary: 309 mg/dL — ABNORMAL HIGH (ref 70–99)

## 2018-09-19 LAB — COMPREHENSIVE METABOLIC PANEL
ALT: 8 U/L (ref 0–44)
AST: 11 U/L — ABNORMAL LOW (ref 15–41)
Albumin: 3.3 g/dL — ABNORMAL LOW (ref 3.5–5.0)
Alkaline Phosphatase: 104 U/L (ref 38–126)
Anion gap: 9 (ref 5–15)
BUN: 22 mg/dL — ABNORMAL HIGH (ref 6–20)
CO2: 32 mmol/L (ref 22–32)
Calcium: 9.4 mg/dL (ref 8.9–10.3)
Chloride: 93 mmol/L — ABNORMAL LOW (ref 98–111)
Creatinine, Ser: 0.69 mg/dL (ref 0.44–1.00)
GFR calc Af Amer: >60 mL/min (ref >60)
GFR calc non Af Amer: >60 mL/min (ref >60)
Glucose, Bld: 490 mg/dL — ABNORMAL HIGH (ref 70–99)
Potassium: 5.1 mmol/L (ref 3.5–5.1)
Sodium: 134 mmol/L — ABNORMAL LOW (ref 135–145)
Total Bilirubin: 0.4 mg/dL (ref 0.3–1.2)
Total Protein: 6.9 g/dL (ref 6.5–8.1)

## 2018-09-19 MED ORDER — MORPHINE SULFATE 15 MG PO TABS
30.0000 mg | ORAL_TABLET | ORAL | Status: DC | PRN
  Administered 2018-09-19 – 2018-09-22 (×16): 30 mg via ORAL
  Filled 2018-09-19 (×16): qty 2

## 2018-09-19 MED ORDER — INSULIN ASPART 100 UNIT/ML ~~LOC~~ SOLN
18.0000 [IU] | Freq: Once | SUBCUTANEOUS | Status: AC
  Administered 2018-09-19: 18 [IU] via SUBCUTANEOUS

## 2018-09-19 MED ORDER — INSULIN ASPART 100 UNIT/ML ~~LOC~~ SOLN
6.0000 [IU] | Freq: Three times a day (TID) | SUBCUTANEOUS | Status: DC
  Administered 2018-09-19 – 2018-09-20 (×2): 6 [IU] via SUBCUTANEOUS
  Filled 2018-09-19 (×2): qty 1

## 2018-09-19 MED ORDER — INSULIN ASPART 100 UNIT/ML ~~LOC~~ SOLN
0.0000 [IU] | Freq: Every day | SUBCUTANEOUS | Status: DC
  Administered 2018-09-19 – 2018-09-20 (×2): 4 [IU] via SUBCUTANEOUS
  Administered 2018-09-21: 23:00:00 2 [IU] via SUBCUTANEOUS
  Filled 2018-09-19 (×3): qty 1

## 2018-09-19 MED ORDER — INSULIN ASPART 100 UNIT/ML ~~LOC~~ SOLN
0.0000 [IU] | Freq: Three times a day (TID) | SUBCUTANEOUS | Status: DC
  Administered 2018-09-19: 20 [IU] via SUBCUTANEOUS
  Administered 2018-09-19: 17:00:00 15 [IU] via SUBCUTANEOUS
  Administered 2018-09-20 (×2): 4 [IU] via SUBCUTANEOUS
  Administered 2018-09-21: 20 [IU] via SUBCUTANEOUS
  Administered 2018-09-21: 17:00:00 7 [IU] via SUBCUTANEOUS
  Administered 2018-09-22: 08:00:00 15 [IU] via SUBCUTANEOUS
  Filled 2018-09-19 (×7): qty 1

## 2018-09-19 MED ORDER — METHYLPREDNISOLONE SODIUM SUCC 125 MG IJ SOLR
60.0000 mg | Freq: Two times a day (BID) | INTRAMUSCULAR | Status: DC
  Administered 2018-09-19 – 2018-09-22 (×7): 60 mg via INTRAVENOUS
  Filled 2018-09-19 (×6): qty 2

## 2018-09-19 MED ORDER — INSULIN GLARGINE 100 UNIT/ML ~~LOC~~ SOLN
20.0000 [IU] | Freq: Every day | SUBCUTANEOUS | Status: DC
  Administered 2018-09-19: 22:00:00 20 [IU] via SUBCUTANEOUS
  Filled 2018-09-19 (×2): qty 0.2

## 2018-09-19 MED ORDER — NICOTINE 21 MG/24HR TD PT24
21.0000 mg | MEDICATED_PATCH | Freq: Every day | TRANSDERMAL | Status: DC
  Administered 2018-09-19 – 2018-09-22 (×4): 21 mg via TRANSDERMAL
  Filled 2018-09-19 (×4): qty 1

## 2018-09-19 NOTE — Progress Notes (Signed)
[place] at [place] NAME: Trellis Guirguis    MR#:  [id number]  DATE OF BIRTH:  [birth year]  SUBJECTIVE:  CHIEF COMPLAINT:   Chief Complaint  Patient presents with  . Shortness of Breath   No new complaint this morning.  No fevers.  Shortness of breath, wheezing and cough improving.  REVIEW OF SYSTEMS:  Review of Systems  Constitutional: Negative for chills and fever.  HENT: Negative for hearing loss and tinnitus.   Eyes: Negative for blurred vision and double vision.  Respiratory: Positive for cough, shortness of breath and wheezing.   Cardiovascular: Negative for chest pain and palpitations.  Gastrointestinal: Negative for heartburn, nausea and vomiting.  Genitourinary: Negative for dysuria and urgency.  Skin: Negative for itching and rash.  Neurological: Negative for dizziness and headaches.  Psychiatric/Behavioral: Negative for depression and hallucinations.    DRUG ALLERGIES:   Allergies  Allergen Reactions  . Flexeril [Cyclobenzaprine] Hives and Nausea And Vomiting  . Hydrocodone Hives  . Other Hives  . Darvocet [Propoxyphene N-Acetaminophen] Hives and Nausea And Vomiting  . Fish-Derived Products Other (See Comments)    Patient does not like to eat fish. Not allergic but prefers not to.  . Propofol Nausea And Vomiting  . Toradol [Ketorolac Tromethamine] Nausea And Vomiting  . Vicodin [Hydrocodone-Acetaminophen] Hives and Nausea And Vomiting  . Ketorolac Nausea And Vomiting  . Tramadol Nausea And Vomiting    Vomiting only   VITALS:  Blood pressure 129/69, pulse 98, temperature 100.3 F (37.9 C), temperature source Oral, resp. rate 18, height 5' (1.524 m), weight 51.9 kg, SpO2 91 %. PHYSICAL EXAMINATION:  Physical Exam  GENERAL:  48 y.o.-year-old patient lying in the bed with no acute distress.  EYES: Pupils equal, round, reactive to light and accommodation. No scleral icterus. Extraocular muscles intact.  HEENT: Head  atraumatic, normocephalic. Oropharynx and nasopharynx clear.  NECK:  Supple, no jugular venous distention. No thyroid enlargement, no tenderness.  LUNGS: Diminished breath sounds bilaterally with minimal wheezing and rhonchi. No use of accessory muscles of respiration.  CARDIOVASCULAR: S1, S2 normal. No murmurs, rubs, or gallops.  ABDOMEN: Soft, nontender, nondistended. Bowel sounds present.   EXTREMITIES: Dressing in place to right foot status post previous amputation.  No pedal edema, cyanosis, or clubbing.  NEUROLOGIC: Lethargic on BiPAP PSYCHIATRIC: The patient is alert and oriented x 3.  SKIN: No obvious rash, lesion, or ulcer.   LABORATORY PANEL:  Female CBC Recent Labs  Lab 09/19/18 0525  WBC 13.4*  HGB 12.4  HCT 39.3  PLT 492*   ------------------------------------------------------------------------------------------------------------------ Chemistries  Recent Labs  Lab 09/19/18 0525  NA 134*  K 5.1  CL 93*  CO2 32  GLUCOSE 490*  BUN 22*  CREATININE 0.69  CALCIUM 9.4  AST 11*  ALT 8  ALKPHOS 104  BILITOT 0.4   RADIOLOGY:  No results found. ASSESSMENT AND PLAN:   #Acute hypoxemic respiratory failure from COPD exacerbation and left lower lobe and middle lobe pneumonia Patient already weaned off BiPAP.  Continue supplemental oxygen.  Initiated tapering of IV Solu-Medrol from 60 mg every 6 hourly to every 12 hourly.  Nebulizer treatments. Patient already on IV antibiotics.   Urine drug screen negative  #Pneumonia Patient currently on IV vancomycin and cefepime.  MRSA by PCR negative besides vancomycin discontinued today.  Monitor clinically. Leukocytosis secondary to steroids. 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 Increased Lantus insulin from 10 to 20 units subcu nightly.   Placed on pre-meal insulin.  Continue sliding scale insulin coverage and monitor.  Expect improvement in blood sugar control with ongoing steroid taper.  Recent glycosylated hemoglobin level of 9.3  #History of substance abuse Urine drug screen negative  DVT prophylaxis; Lovenox   All the records are reviewed and case discussed with Care Management/Social Worker. Management plans discussed with the patient, family and they are in agreement.  CODE STATUS: Full Code  TOTAL TIME TAKING CARE OF THIS PATIENT: 37 minutes.   More than 50% of the time was spent in counseling/coordination of care: YES  POSSIBLE D/C IN 2 DAYS, DEPENDING ON CLINICAL CONDITION.   Rossie Scarfone M.D on 09/19/2018 at 12:23 PM  Between 7am to 6pm - Pager - [phone]  After 6pm go to [email or web address] - Proofreader  [facility]  Office  [phone]  CC: Primary care physician; Medicine, [facility]  Note: This dictation was prepared with Dragon dictation along with smaller phrase technology. Any transcriptional errors that result from this process are unintentional.

## 2018-09-19 NOTE — Progress Notes (Signed)
cbg 547/537. Dr Stark Jock notified and said to give the patient novolog 18 units, order nicotine patch

## 2018-09-19 NOTE — Progress Notes (Signed)
Inpatient Diabetes Program Recommendations  AACE/ADA: New Consensus Statement on Inpatient Glycemic Control  Target Ranges:  Prepandial:   less than 140 mg/dL      Peak postprandial:   less than 180 mg/dL (1-2 hours)      Critically ill patients:  140 - 180 mg/dL   Results for ASAIAH, HUNNICUTT (MRN [id number]) as of 09/19/2018 10:20  Ref. Range 09/18/2018 09:35 09/18/2018 11:38 09/18/2018 15:36 09/18/2018 19:48 09/18/2018 23:46 09/19/2018 04:05 09/19/2018 07:42 09/19/2018 07:56  Glucose-Capillary Latest Ref Range: 70 - 99 mg/dL 275 (H) 287 (H) 201 (H) 411 (H) 493 (H) 454 (H) 541 (HH) 537 (HH)  Results for COURTNEI, RUDDELL (MRN [id number]) as of 09/19/2018 10:20  Ref. Range 07/29/2018 04:04 09/09/2018 04:28  Hemoglobin A1C Latest Ref Range: 4.8 - 5.6 % 9.5 (H) 9.3 (H)   Review of Glycemic Control  Outpatient Diabetes medications: Lantus 25 units QHS, Humalog 1-18 units QID Current orders for Inpatient glycemic control: Lantus 20 units QHS, Novolog 0-15 units TID with meals; Solumedrol 60 mg Q6H  Inpatient Diabetes Program Recommendations:   Insulin - Basal: Patient received Lantus 10 units on 09/18/18. Noted Lantus 20 units QHS ordered today.  Correction (SSI): Please add Novolog 0-5 units QHS for bedtime correction.  Insulin - Meal Coverage: If steroids are continued, please order Novolog 6 units TID with meals for meal coverage if patient eats at least 50% of meals.  NOTE: Noted hyperglycemia likely due to steroids and decreased Lantus dose given on 09/18/18. Lantus has been increased to 20 units QHS.  Anticipate patient will need Novolog meal coverage and bedtime correction.  Thanks, Barnie Alderman, RN, MSN, CDE Diabetes Coordinator Inpatient Diabetes Program [phone] (Team Pager from 8am to 5pm)

## 2018-09-19 NOTE — Progress Notes (Signed)
Morphine tablets expired. Dr Stark Jock gave order to renew morphine tablets

## 2018-09-20 LAB — GLUCOSE, CAPILLARY
Glucose-Capillary: 462 mg/dL — ABNORMAL HIGH (ref 70–99)
Glucose-Capillary: 184 mg/dL — ABNORMAL HIGH (ref 70–99)
Glucose-Capillary: 178 mg/dL — ABNORMAL HIGH (ref 70–99)
Glucose-Capillary: 349 mg/dL — ABNORMAL HIGH (ref 70–99)

## 2018-09-20 LAB — BASIC METABOLIC PANEL
Anion gap: 11 (ref 5–15)
BUN: 29 mg/dL — ABNORMAL HIGH (ref 6–20)
CO2: 30 mmol/L (ref 22–32)
Calcium: 9.3 mg/dL (ref 8.9–10.3)
Chloride: 91 mmol/L — ABNORMAL LOW (ref 98–111)
Creatinine, Ser: 0.76 mg/dL (ref 0.44–1.00)
GFR calc Af Amer: >60 mL/min (ref >60)
GFR calc non Af Amer: >60 mL/min (ref >60)
Glucose, Bld: 488 mg/dL — ABNORMAL HIGH (ref 70–99)
Potassium: 4.8 mmol/L (ref 3.5–5.1)
Sodium: 132 mmol/L — ABNORMAL LOW (ref 135–145)

## 2018-09-20 LAB — MAGNESIUM: Magnesium: 2.1 mg/dL (ref 1.7–2.4)

## 2018-09-20 MED ORDER — INSULIN GLARGINE 100 UNIT/ML ~~LOC~~ SOLN
25.0000 [IU] | Freq: Every day | SUBCUTANEOUS | Status: DC
  Administered 2018-09-20: 25 [IU] via SUBCUTANEOUS
  Filled 2018-09-20 (×2): qty 0.25

## 2018-09-20 MED ORDER — POLYETHYLENE GLYCOL 3350 17 G PO PACK
17.0000 g | PACK | Freq: Every day | ORAL | Status: DC | PRN
  Administered 2018-09-21 – 2018-09-22 (×2): 17 g via ORAL
  Filled 2018-09-20 (×3): qty 1

## 2018-09-20 MED ORDER — CALCIUM CARBONATE ANTACID 500 MG PO CHEW
1.0000 | CHEWABLE_TABLET | Freq: Two times a day (BID) | ORAL | Status: DC | PRN
  Administered 2018-09-20 – 2018-09-22 (×2): 200 mg via ORAL
  Filled 2018-09-20 (×2): qty 1

## 2018-09-20 MED ORDER — IPRATROPIUM-ALBUTEROL 0.5-2.5 (3) MG/3ML IN SOLN
3.0000 mL | Freq: Three times a day (TID) | RESPIRATORY_TRACT | Status: DC
  Administered 2018-09-20 – 2018-09-22 (×7): 3 mL via RESPIRATORY_TRACT
  Filled 2018-09-20 (×7): qty 3

## 2018-09-20 MED ORDER — INSULIN ASPART 100 UNIT/ML ~~LOC~~ SOLN
10.0000 [IU] | Freq: Three times a day (TID) | SUBCUTANEOUS | Status: DC
  Administered 2018-09-21 – 2018-09-22 (×5): 10 [IU] via SUBCUTANEOUS
  Filled 2018-09-20 (×6): qty 1

## 2018-09-20 MED ORDER — INSULIN ASPART 100 UNIT/ML ~~LOC~~ SOLN
20.0000 [IU] | SUBCUTANEOUS | Status: AC
  Administered 2018-09-20: 20 [IU] via SUBCUTANEOUS
  Filled 2018-09-20: qty 1

## 2018-09-20 MED ORDER — INSULIN GLARGINE 100 UNIT/ML ~~LOC~~ SOLN
10.0000 [IU] | Freq: Once | SUBCUTANEOUS | Status: AC
  Administered 2018-09-20: 10:00:00 10 [IU] via SUBCUTANEOUS
  Filled 2018-09-20: qty 0.1

## 2018-09-20 MED ORDER — POLYETHYLENE GLYCOL 3350 17 G PO PACK: 17.0000 g | PACK | Freq: Every day | ORAL | Status: DC

## 2018-09-20 NOTE — Progress Notes (Signed)
Inpatient Diabetes Program Recommendations  AACE/ADA: New Consensus Statement on Inpatient Glycemic Control   Target Ranges:  Prepandial:   less than 140 mg/dL      Peak postprandial:   less than 180 mg/dL (1-2 hours)      Critically ill patients:  140 - 180 mg/dL   Results for [name], [name] (MRN [id number]) as of 09/20/2018 08:24  Ref. Range 09/19/2018 07:56 09/19/2018 11:59 09/19/2018 16:48 09/19/2018 21:09 09/20/2018 07:55  Glucose-Capillary Latest Ref Range: 70 - 99 mg/dL 537 (HH) 356 (H) 343 (H) 309 (H) 462 (H)   Review of Glycemic Control  Outpatient Diabetes medications: Lantus 25 units QHS, Humalog 1-18 units QID Current orders for Inpatient glycemic control: Lantus 25 units QHS, Novolog 0-20 units TID with meals, Novolog 0-5 units QHS, Novolog 6 units TID with meals; Solumedrol 60 mg Q12H  Inpatient Diabetes Program Recommendations:   Insulin - Basal:  Noted Lantus increased to 25 units QHS this morning. Please consider ordering a one time Lantus 10 units x1 now (and continue Lantus 25 units QHS as ordered).  Insulin - Meal Coverage: If steroids are continued, please consider increasing meal coverage to Novolog 10 units TID with meals if patient eats at least 50% of meals.  Thanks, Barnie Alderman, RN, MSN, CDE Diabetes Coordinator Inpatient Diabetes Program [phone] (Team Pager from 8am to 5pm)

## 2018-09-20 NOTE — TOC Initial Note (Addendum)
Transition of Care [facility]) - Initial/Assessment Note    Patient Details  Name: Katie Guzman MRN: [id number] Date of Birth: 1970/09/19  [facility] of [facility]) CM/SW Contact:    Katrina Stack, RN Phone Number: 09/20/2018, 4:38 PM  Clinical Narrative:                Patient admitted to icu stepdown due to the need for continuous bipap for COPD and, Asthma.  Initial PCO2 in the 70's and 80's. She is currently under the care of Dr Juluis Rainier at [facility].  She is suppose to have a PET Scan this week because "the cancer is back>'  Says has chronic oxygen through [facility] and was re certified 8 months ago.  She wishes to have a smaller portable option. Contacted [facility] and informed by Larkin Ina that patient has been closed to the agency since 2019 due to noncompliance. Agency has not been able to pick up the oxygen equipment because patient has not returned calls..  Patient says she is not aware of any problem with [facility] DME.  Heads up referral to [facility] with  [employer] for home 02 and smaller portable option.  Unsure if patient would qualify for an NIV with her PC02.  Patient will have to be requalified to change companies. [facility] will not accept the patient again for service. Patient does not know the name of her PCP and unsure which name or practice is on her medicaid card.  At present, she primarily is seeing Dr Leitha Schuller.  She is agreeable to home health nurse and social work.  Have asked [employer] to assess to see if can provide home health service to this Medicaid client.  Patient has no agency preference. Very tearful about her situation.  She does not want to die and leave her grandchildren. "I love them so much and there is so much I need to teach them before I go." CM requested having her daughter bring her medicaid card or call with the name of the medical practice on her card.    Expected Discharge Plan: [place]     Patient Goals and CMS Choice   CMS  Medicare.gov Compare Post Acute Care list provided to:: Patient Choice offered to / list presented to : Patient  Expected Discharge Plan and Services Expected Discharge Plan: [place] In-house Referral: Chaplain   Post Acute Care Choice: Durable Medical Equipment, Home Health Living arrangements for the past 2 months: Single Family Home                               Date [place]: 09/20/18   Representative spoke with at [place]: Corene Cornea  Prior Living Arrangements/Services Living arrangements for the past 2 months: [place] Lives with:: Adult Children, Other (Comment)(grand children) Patient language and need for interpreter reviewed:: Yes Do you feel safe going back to the place where you live?: Yes      Need for Family Participation in Patient Care: No (Comment) Care giver support system in place?: Yes (comment) Current home services: DME(02 thru [facility]) Criminal Activity/Legal Involvement Pertinent to Current Situation/Hospitalization: No - Comment as needed  Activities of Daily Living Home Assistive Devices/Equipment: None ADL Screening (condition at time of admission) Patient's cognitive ability adequate to safely complete daily activities?: Yes Is the patient deaf or have difficulty hearing?: No Does the  patient have difficulty seeing, even when wearing glasses/contacts?: No Does the patient have difficulty concentrating, remembering, or making decisions?: No Patient able to express need for assistance with ADLs?: Yes Does the patient have difficulty dressing or bathing?: No Independently performs ADLs?: Yes (appropriate for developmental age) Does the patient have difficulty walking or climbing stairs?: No Weakness of Legs: None Weakness of Arms/Hands: None  Permission Sought/Granted Permission sought to share information with : Customer service manager, Case Optician, dispensing granted to share  information with : Yes, Verbal Permission Granted              Emotional Assessment Appearance:: Appears older than stated age Attitude/Demeanor/Rapport: Apprehensive Affect (typically observed): Tearful/Crying   Alcohol / Substance Use: Tobacco Use Psych Involvement: No (comment)  Admission diagnosis:  Hypercarbia [R06.89] SOB (shortness of breath) [R06.02] Pneumonia of right lung due to infectious organism, unspecified part of lung [J18.9] Patient Active Problem List   Diagnosis Date Noted  . Acute respiratory failure ([place]) 09/18/2018  . Toxic encephalopathy 09/09/2018  . Acute respiratory failure with hypercapnia ([place])   . Aspiration into airway   . Polysubstance abuse ([place])   . Osteomyelitis of ankle or foot, acute, right ([place])   . Cellulitis of right foot 08/18/2018  . Diabetic ulcer of right foot associated with diabetes mellitus due to underlying condition ([place]) 07/29/2018  . Diabetic foot infection ([place]) 03/27/2017  . Skin ulcer ([place]) 08/23/2016  . Diabetic foot ulcer ([place]) 08/23/2016  . Diabetic ulcer of toe of left foot associated with type 2 diabetes mellitus ([place])   . 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 Tenosynovitis of foot 04/16/2015  . Hypokalemia 04/16/2015  . Cellulitis 04/15/2015  . Diabetes mellitus ([place]) 04/15/2015  . Pain syndrome, chronic 04/15/2015  . COPD (chronic obstructive pulmonary disease) ([place]) 04/15/2015  . Diabetic neuropathy ([place]) 04/15/2015  . IDDM (insulin dependent diabetes mellitus) ([place])   . COPD exacerbation ([place]) 10/19/2014   PCP:  Medicine, [place]:   [place], [place] - [address] [place] [address] Phone: [phone] Fax: [place], [place] - [place] [address] Phone: [phone] Fax: [phone]  [id number] [address], [place] [address] Phone: [phone] Fax: [phone]     Social Determinants of Health (SDOH) Interventions    Readmission Risk Interventions No flowsheet data found.

## 2018-09-20 NOTE — Progress Notes (Signed)
[place] at [place] NAME: Katie Guzman    MR#:  [id number]  DATE OF BIRTH:  1970-12-17  SUBJECTIVE:  CHIEF COMPLAINT:   Chief Complaint  Patient presents with  . Shortness of Breath   No new complaint this morning.  No fevers.  Shortness of breath, wheezing and cough gradually improving. Appears to have some difficulty with swallowing.  Speech therapy consult placed.  REVIEW OF SYSTEMS:  Review of Systems  Constitutional: Negative for chills and fever.  HENT: Negative for hearing loss and tinnitus.   Eyes: Negative for blurred vision and double vision.  Respiratory: Positive for cough and shortness of breath. Negative for wheezing.   Cardiovascular: Negative for chest pain and palpitations.  Gastrointestinal: Negative for heartburn, nausea and vomiting.  Genitourinary: Negative for dysuria and urgency.  Skin: Negative for itching and rash.  Neurological: Negative for dizziness and headaches.  Psychiatric/Behavioral: Negative for depression and hallucinations.    DRUG ALLERGIES:   Allergies  Allergen Reactions  . Flexeril [Cyclobenzaprine] Hives and Nausea And Vomiting  . Hydrocodone Hives  . Other Hives  . Darvocet [Propoxyphene N-Acetaminophen] Hives and Nausea And Vomiting  . Fish-Derived Products Other (See Comments)    Patient does not like to eat fish. Not allergic but prefers not to.  . Propofol Nausea And Vomiting  . Toradol [Ketorolac Tromethamine] Nausea And Vomiting  . Vicodin [Hydrocodone-Acetaminophen] Hives and Nausea And Vomiting  . Ketorolac Nausea And Vomiting  . Tramadol Nausea And Vomiting    Vomiting only   VITALS:  Blood pressure (!) 151/83, pulse 100, temperature (!) 97.5 F (36.4 C), temperature source Oral, resp. rate 16, height 5' (1.524 m), weight 51.9 kg, SpO2 98 %. PHYSICAL EXAMINATION:  Physical Exam  GENERAL:  48 y.o.-year-old patient lying in the bed with no acute distress.  EYES: Pupils  equal, round, reactive to light and accommodation. No scleral icterus. Extraocular muscles intact.  HEENT: Head atraumatic, normocephalic. Oropharynx and nasopharynx clear.  NECK:  Supple, no jugular venous distention. No thyroid enlargement, no tenderness.  LUNGS: Diminished breath sounds bilaterally with minimal wheezing and rhonchi. No use of accessory muscles of respiration.  CARDIOVASCULAR: S1, S2 normal. No murmurs, rubs, or gallops.  ABDOMEN: Soft, nontender, nondistended. Bowel sounds present.   EXTREMITIES: Dressing in place to right foot status post previous amputation.  No pedal edema, cyanosis, or clubbing.  NEUROLOGIC: Lethargic on BiPAP PSYCHIATRIC: The patient is alert and oriented x 3.  SKIN: No obvious rash, lesion, or ulcer.   LABORATORY PANEL:  Female CBC Recent Labs  Lab 09/19/18 0525  WBC 13.4*  HGB 12.4  HCT 39.3  PLT 492*   ------------------------------------------------------------------------------------------------------------------ Chemistries  Recent Labs  Lab 09/19/18 0525 09/20/18 0625  NA 134* 132*  K 5.1 4.8  CL 93* 91*  CO2 32 30  GLUCOSE 490* 488*  BUN 22* 29*  CREATININE 0.69 0.76  CALCIUM 9.4 9.3  MG  --  2.1  AST 11*  --   ALT 8  --   ALKPHOS 104  --   BILITOT 0.4  --    RADIOLOGY:  No results found. ASSESSMENT AND PLAN:   #Acute hypoxemic respiratory failure from COPD exacerbation and left lower lobe and middle lobe pneumonia Patient already weaned off BiPAP.  Continue supplemental oxygen.  Initiated tapering of IV Solu-Medrol from 60 mg every 6 hourly to every 12 hourly yesterday.  Nebulizer treatments. Patient already on IV antibiotics.   Urine drug  screen negative  #Pneumonia Patient currently on IV vancomycin and cefepime.  MRSA by PCR negative besides vancomycin discontinued previously.  Monitor clinically. Leukocytosis secondary to steroids. 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  Speech therapy consult was placed. Pain control with PRN morphine  #Diabetes mellitus Blood sugars elevated this morning due to patient being on steroids. Increased Lantus insulin to 25 units subcu nightly.  Given one-time dose of Lantus insulin 10 units x 1 this morning. Increased pre-meal insulin from 6 to 10 units 3 times daily.  To gradually taper down insulin requirement once blood sugars improve with steroid taper Recent glycosylated hemoglobin level of 9.3  #History of substance abuse Urine drug screen negative  DVT prophylaxis; Lovenox   All the records are reviewed and case discussed with Care Management/Social Worker. Management plans discussed with the patient, family and they are in agreement.  CODE STATUS: Full Code  TOTAL TIME TAKING CARE OF THIS PATIENT: 35 minutes.   More than 50% of the time was spent in counseling/coordination of care: YES  POSSIBLE D/C IN 2 DAYS, DEPENDING ON CLINICAL CONDITION.   Jamillia Closson M.D on 09/20/2018 at 12:09 PM  Between 7am to 6pm - Pager - [phone]  After 6pm go to [email or web address] - Proofreader  [facility]  Office  [phone]  CC: Primary care physician; Medicine, [facility]  Note: This dictation was prepared with Dragon dictation along with smaller phrase technology. Any transcriptional errors that result from this process are unintentional.

## 2018-09-20 NOTE — Progress Notes (Signed)
Katie Guzman was open with chaplain regarding her health decline. She acknowledges she is very sick. She states "I feel I was given a chance to complete unfinished business." Her wishes are to maximize her time with family and is aware that treatment options might return her to another medical event like she had last week. She is open to conversations around palliative care support. Family dynamics are a struggle and she desires her family to have quality time together (two daughters, five grandchildren). Katie Guzman is open to EOL education. Prayer was offered for Katie Guzman and her family for a healthy interactions.   09/20/18 1800  Clinical Encounter Type  Visited With Patient  Visit Type Initial  Referral From Nurse  Spiritual Encounters  Spiritual Needs Emotional;Grief support;Prayer

## 2018-09-21 LAB — BASIC METABOLIC PANEL
Anion gap: 8 (ref 5–15)
BUN: 32 mg/dL — ABNORMAL HIGH (ref 6–20)
CO2: 33 mmol/L — ABNORMAL HIGH (ref 22–32)
Calcium: 9.6 mg/dL (ref 8.9–10.3)
Chloride: 92 mmol/L — ABNORMAL LOW (ref 98–111)
Creatinine, Ser: 0.65 mg/dL (ref 0.44–1.00)
GFR calc Af Amer: >60 mL/min (ref >60)
GFR calc non Af Amer: >60 mL/min (ref >60)
Glucose, Bld: 429 mg/dL — ABNORMAL HIGH (ref 70–99)
Potassium: 5.2 mmol/L — ABNORMAL HIGH (ref 3.5–5.1)
Sodium: 133 mmol/L — ABNORMAL LOW (ref 135–145)

## 2018-09-21 LAB — GLUCOSE, CAPILLARY
Glucose-Capillary: 437 mg/dL — ABNORMAL HIGH (ref 70–99)
Glucose-Capillary: 76 mg/dL (ref 70–99)
Glucose-Capillary: 225 mg/dL — ABNORMAL HIGH (ref 70–99)
Glucose-Capillary: 235 mg/dL — ABNORMAL HIGH (ref 70–99)

## 2018-09-21 LAB — MAGNESIUM: Magnesium: 2 mg/dL (ref 1.7–2.4)

## 2018-09-21 MED ORDER — ESCITALOPRAM OXALATE 10 MG PO TABS
10.0000 mg | ORAL_TABLET | Freq: Every day | ORAL | Status: DC
  Administered 2018-09-21 – 2018-09-22 (×2): 10 mg via ORAL
  Filled 2018-09-21 (×2): qty 1

## 2018-09-21 MED ORDER — PANTOPRAZOLE SODIUM 40 MG PO TBEC
40.0000 mg | DELAYED_RELEASE_TABLET | Freq: Every day | ORAL | Status: DC
  Administered 2018-09-21 – 2018-09-22 (×2): 40 mg via ORAL
  Filled 2018-09-21: qty 1

## 2018-09-21 MED ORDER — INSULIN GLARGINE 100 UNIT/ML ~~LOC~~ SOLN
30.0000 [IU] | Freq: Every day | SUBCUTANEOUS | Status: DC
  Administered 2018-09-21: 30 [IU] via SUBCUTANEOUS
  Filled 2018-09-21 (×2): qty 0.3

## 2018-09-21 MED ORDER — MIRTAZAPINE 15 MG PO TABS
7.5000 mg | ORAL_TABLET | Freq: Every day | ORAL | Status: DC
  Administered 2018-09-21: 7.5 mg via ORAL
  Filled 2018-09-21: qty 1

## 2018-09-21 MED ORDER — SODIUM CHLORIDE 0.9 % IV SOLN
INTRAVENOUS | Status: DC | PRN
  Administered 2018-09-21 – 2018-09-22 (×2): 250 mL via INTRAVENOUS

## 2018-09-21 NOTE — Evaluation (Addendum)
Clinical/Bedside Swallow Evaluation Patient Details  Name: Katie Guzman MRN: [id number] Date of Birth: 06/26/1970  Today's Date: 09/21/2018 Time: SLP Start Time (ACUTE ONLY): 1300 SLP Stop Time (ACUTE ONLY): 1415 SLP Time Calculation (min) (ACUTE ONLY): 75 min  Past Medical History:  Past Medical History:  Diagnosis Date  . Asthma   . Cancer ([place])    lung  . Cellulitis 04/15/2015  . COPD (chronic obstructive pulmonary disease) ([place])   . Diabetes mellitus    Poorly controlled with complications  . Emphysema   . Fibromyalgia   . Neuropathy   . Tenosynovitis of foot 04/16/2015   Past Surgical History:  Past Surgical History:  Procedure Laterality Date  . AMPUTATION Right 08/19/2018   Procedure: AMPUTATION RAY;  Surgeon: Sharlotte Alamo, DPM;  Location: [facility] ORS;  Service: Podiatry;  Laterality: Right;  . AMPUTATION TOE Right 08/19/2018   Procedure: AMPUTATION TOE;  Surgeon: Sharlotte Alamo, DPM;  Location: [facility] ORS;  Service: Podiatry;  Laterality: Right;  . BACK SURGERY    . CESAREAN SECTION    . TONSILLECTOMY    . TUBAL LIGATION    . WOUND DEBRIDEMENT Right 08/19/2018   Procedure: DEBRIDEMENT WOUND RIGHT FOOT, DIABETIC;  Surgeon: Sharlotte Alamo, DPM;  Location: [facility] ORS;  Service: Podiatry;  Laterality: Right;   HPI:  Pt is a 48 y.o. female with history of COPD w/ exacerbation, Reflux, diabetes, Supraglottic/laryngeal Cancer (w/ radiation tx at [facility] but now on hold d/t "burning my throat", per pt report), tobacco abuse, h/o drug abuse who presents the emergency department for worsening shortness of breath x 3 days.  Patient states she feels the etiology of her shortness of breath is multifactorial including her throat cancer, as well as her COPD.  She reports she smokes and a previously productive cough over the last several days, though none currently.  She denies any fever.  She denies any sick contacts.  At admission, she was sent to CCU on BiPAP d/t severe SOB and increased WOB and  obtunded.  Currently, respiratory status has improved significantly and she remains only on [place] O2 support.  Pt has shortness of breath, wheezing at times; vocal quality is raspy/gravely w/ Dysphonia - pt speaks mostly in a whisper.  Baseline Dry mouth; sore throat(MD made aware).   Assessment / Plan / Recommendation Clinical Impression  Pt appears to present w/ grossly adequate pharyngeal phase swallowing of po trials w/ no immediate, overt s/s of aspiration noted; oral phase mastication impacted by missing, poor Dentition status baseline. Pt stated she has been losing more teeth since XRT txs. As far as can be determined at this BSE, she orally and pharyngeally clears the bites/sips w/ no immediate, overt s/s of aspiration, however, the bites/sips do NOT stay down long, and she regurgitates material and phlegm/fluid. She said this has worsened in the past few weeks/months. Suspect Esophageal Dysmotility. She also stated "the tumor has gotten bigger they tell me".  Suspect the tumor could be pressing on the Esophagus causing dysmotility vs Esophageal dysmotility from the XRT and/or Reflux issues baseline. During the oral phase, pt exhibited adequate bolus management and oral clearing of all trials; min increased time w/ bolus mastication of solids d/t Dentition status. During/post the swallowing, pt's vocal quality did not become wet or gurgly; the raspy quality remained. No immediate clinical signs including coughing or throat clearing witnessed and no decline in her respiratory effort/needs noted during/post the po trials. Pt fed self; no assistance needed w/ setup. OM  exam revealed no lingual/labial weakness or decreased tone.   Recommend continue w/ current diet w/ thin liquids as ordered d/t pt knowing foods she can "tolerate" and break down for eating(easier mastication d/t poor Dentition status). Discussed use of condiments, gravies, and soups to moisten foods. Discussed food preparation including mince  tougher meats and adding gravy - she agreed. Pt may benefit from objective swallow study where she receives her Cancer care to r/o any pharyngeal phase deficits; monitor Pulmonary status.  Recommend a GI consult and Direct Viewing of the Esophagus, Endoscopy, d/t pt's report of food "stopping" (pointing to mid sternum) and Regurgitation w/in 1-2 minutes post swallowing - ANY Regurgitation puts her at risk for aspiration of the Refluxed material thus Pulmonary decline. Recommend Dietician f/u for nutritional support. Discussed pt's dry mouth and need for moistening options including frequent ice chips; discussed w/ MD possible mouth rinse.  SLP Visit Diagnosis: Dysphagia, oral phase (R13.11);Dysphagia, pharyngoesophageal phase (R13.14)(suspect impact from the Esophageal phase)    Aspiration Risk  Mild aspiration risk;Risk for inadequate nutrition/hydration    Diet Recommendation  Regular/mech soft diet(meats well cut w/ gravy added to moisten well); thin liquids. General aspiration precautions; REFLUX precautions.  Medication Administration: Whole meds with liquid(but use of a Puree if easier for swallowing)    Other  Recommendations Recommended Consults: Consider GI evaluation;Consider esophageal assessment(Dietician consult) Oral Care Recommendations: Oral care BID;Patient independent with oral care Other Recommendations: (n/a)   Follow up Recommendations None      Frequency and Duration (n/a)  (n/a)       Prognosis Prognosis for Safe Diet Advancement: Fair Barriers to Reach Goals: Time post onset;Severity of deficits(medical status baseline)      Swallow Study   General Date of Onset: 09/18/18 HPI: Pt is a 48 y.o. female with history of COPD w/ exacerbation, Reflux, diabetes, Supraglottic/laryngeal Cancer (w/ radiation tx at [facility] but now on hold d/t "burning my throat", per pt report), tobacco abuse, h/o drug abuse who presents the emergency department for worsening shortness of breath  x 3 days.  Patient states she feels the etiology of her shortness of breath is multifactorial including her throat cancer, as well as her COPD.  She reports she smokes and a previously productive cough over the last several days, though none currently.  She denies any fever.  She denies any sick contacts.  At admission, she was sent to CCU on BiPAP d/t severe SOB and increased WOB and obtunded.  Currently, respiratory status has improved significantly and she remains only on [place] O2 support.  Pt has shortness of breath, wheezing at times; vocal quality is raspy/gravely w/ Dysphonia - pt speaks mostly in a whisper.  Baseline Dry mouth; sore throat(MD made aware). Type of Study: Bedside Swallow Evaluation Previous Swallow Assessment: unsure of f/u at [facility] where pt receives her care Diet Prior to this Study: Regular;Thin liquids Temperature Spikes Noted: No(wbc 13.4) Respiratory Status: Nasal cannula(2 liters) History of Recent Intubation: No Behavior/Cognition: Alert;Cooperative(min frustrated) Oral Cavity Assessment: Dry Oral Care Completed by SLP: Recent completion by staff Oral Cavity - Dentition: Poor condition;Missing dentition(s/p XRT) Vision: Functional for self-feeding Self-Feeding Abilities: Able to feed self Patient Positioning: Upright in bed(EOB) Baseline Vocal Quality: Hoarse;Breathy(gravely/raspy; dysphonia) Volitional Cough: (Raspy) Volitional Swallow: Able to elicit    Oral/Motor/Sensory Function Overall Oral Motor/Sensory Function: Within functional limits   Ice Chips Ice chips: Within functional limits Presentation: Spoon(2 trials)   Thin Liquid Thin Liquid: Within functional limits Presentation: Self Fed;Straw(6 trials total)  Other Comments: pt appeared to clear all bolus trials through the oropharyngeal phase - expectoration followed w/in ~1 min.    Nectar Thick Nectar Thick Liquid: Not tested   Honey Thick Honey Thick Liquid: Not tested   Puree Puree: Within functional  limits Presentation: Self Fed;Spoon(2 trials) Other Comments: pt appeared to clear bolus trials through the oropharyngeal phase - expectoration followed w/in ~1 min.   Solid     Solid: Impaired Presentation: Self Fed;Spoon(2 trials) Oral Phase Impairments: Impaired mastication(missing dentition; sore mouth) Oral Phase Functional Implications: Impaired mastication(min) Pharyngeal Phase Impairments: (none) Other Comments: pt appeared to clear bolus trials through the oropharyngeal phase - expectoration followed w/in ~1 min.       Katie Kenner, MS, CCC-SLP Katie Guzman 09/21/2018,2:24 PM

## 2018-09-21 NOTE — Progress Notes (Signed)
Pt blood glucose is 437. MD notified. Orders given to continue insulin as scheduled. Orders followed

## 2018-09-21 NOTE — Consult Note (Signed)
Pharmacy Antibiotic Note  Katie Guzman is a 48 y.o. female admitted on 09/18/2018 with pneumonia.  Pharmacy has been consulted for cefepime dosing. Vancomycin d/c 8/10 after MRSA PCR resulted negative. Today is day 4 of antibiotics. Patient has been afebrile last 24h.   Plan: Cefepime 2g q8h   Height: 5' (152.4 cm) Weight: 114 lb 6.7 oz (51.9 kg) IBW/kg (Calculated) : 45.5  Temp (24hrs), Avg:98.2 F (36.8 C), Min:97.5 F (36.4 C), Max:98.7 F (37.1 C)  Recent Labs  Lab 09/18/18 0632 09/19/18 0525 09/20/18 0625 09/21/18 0645  WBC 10.6* 13.4*  --   --   CREATININE 0.44 0.69 0.76 0.65  LATICACIDVEN 0.8  --   --   --     Estimated Creatinine Clearance: 61.1 mL/min (by C-G formula based on SCr of 0.65 mg/dL).    Allergies  Allergen Reactions  . Flexeril [Cyclobenzaprine] Hives and Nausea And Vomiting  . Hydrocodone Hives  . Other Hives  . Darvocet [Propoxyphene N-Acetaminophen] Hives and Nausea And Vomiting  . Fish-Derived Products Other (See Comments)    Patient does not like to eat fish. Not allergic but prefers not to.  . Propofol Nausea And Vomiting  . Toradol [Ketorolac Tromethamine] Nausea And Vomiting  . Vicodin [Hydrocodone-Acetaminophen] Hives and Nausea And Vomiting  . Ketorolac Nausea And Vomiting  . Tramadol Nausea And Vomiting    Vomiting only    Antimicrobials this admission: Cefepime 8/9 >>  Vancomycin 8/9 >> 8/10  Dose adjustments this admission: N/A  Microbiology results: 8/9 BCx: NGTD 8/9 MRSA PCR: negative 8/9 COVID: negative  Thank you for allowing pharmacy to be a part of this patient's care.  [place] Resident 09/21/2018 12:19 PM

## 2018-09-21 NOTE — Progress Notes (Signed)
The patient is receiving Protonix by the intravenous route.  Based on criteria approved by the Pharmacy and [place], the medication is being converted to the equivalent oral dose form.  These criteria include: -No active GI bleeding -Able to tolerate diet of full liquids (or better) or tube feeding -Able to tolerate other medications by the oral or enteral route  If you have any questions about this conversion, please contact the Pharmacy Department. Thank you.  Katie Guzman

## 2018-09-21 NOTE — Progress Notes (Signed)
[place] [facility] [place] NAME: Kaliegh Willadsen    MR#:  [id number]  DATE OF BIRTH:  [birth year]  SUBJECTIVE:  CHIEF COMPLAINT:   Chief Complaint  Patient presents with  . Shortness of Breath   Patient has significant reflux she is doing better .  REVIEW OF SYSTEMS:  Review of Systems  Constitutional: Negative for chills and fever.  HENT: Negative for hearing loss and tinnitus.   Eyes: Negative for blurred vision and double vision.  Respiratory: Positive for cough and shortness of breath. Negative for wheezing.   Cardiovascular: Negative for chest pain and palpitations.  Gastrointestinal: Negative for heartburn, nausea and vomiting.  Genitourinary: Negative for dysuria and urgency.  Skin: Negative for itching and rash.  Neurological: Negative for dizziness and headaches.  Psychiatric/Behavioral: Negative for depression and hallucinations.    DRUG ALLERGIES:   Allergies  Allergen Reactions  . Flexeril [Cyclobenzaprine] Hives and Nausea And Vomiting  . Hydrocodone Hives  . Other Hives  . Darvocet [Propoxyphene N-Acetaminophen] Hives and Nausea And Vomiting  . Fish-Derived Products Other (See Comments)    Patient does not like to eat fish. Not allergic but prefers not to.  . Propofol Nausea And Vomiting  . Toradol [Ketorolac Tromethamine] Nausea And Vomiting  . Vicodin [Hydrocodone-Acetaminophen] Hives and Nausea And Vomiting  . Ketorolac Nausea And Vomiting  . Tramadol Nausea And Vomiting    Vomiting only   VITALS:  Blood pressure 110/70, pulse 93, temperature (!) 97.5 F (36.4 C), temperature source Oral, resp. rate 20, height 5' (1.524 m), weight 51.9 kg, SpO2 95 %. PHYSICAL EXAMINATION:  Physical Exam  GENERAL:  48 y.o.-year-old patient lying in the bed with no acute distress.  EYES: Pupils equal, round, reactive to light and accommodation. No scleral icterus. Extraocular muscles intact.  HEENT: Head atraumatic,  normocephalic. Oropharynx and nasopharynx clear.  NECK:  Supple, no jugular venous distention. No thyroid enlargement, no tenderness.  LUNGS: Diminished breath sounds bilaterally with minimal wheezing and rhonchi. No use of accessory muscles of respiration.  CARDIOVASCULAR: S1, S2 normal. No murmurs, rubs, or gallops.  ABDOMEN: Soft, nontender, nondistended. Bowel sounds present.   EXTREMITIES: Dressing in place to right foot status post previous amputation.  No pedal edema, cyanosis, or clubbing.  NEUROLOGIC: Lethargic on BiPAP PSYCHIATRIC: The patient is alert and oriented x 3.  SKIN: No obvious rash, lesion, or ulcer.   LABORATORY PANEL:  Female CBC Recent Labs  Lab 09/19/18 0525  WBC 13.4*  HGB 12.4  HCT 39.3  PLT 492*   ------------------------------------------------------------------------------------------------------------------ Chemistries  Recent Labs  Lab 09/19/18 0525  09/21/18 0645  NA 134*   < > 133*  K 5.1   < > 5.2*  CL 93*   < > 92*  CO2 32   < > 33*  GLUCOSE 490*   < > 429*  BUN 22*   < > 32*  CREATININE 0.69   < > 0.65  CALCIUM 9.4   < > 9.6  MG  --    < > 2.0  AST 11*  --   --   ALT 8  --   --   ALKPHOS 104  --   --   BILITOT 0.4  --   --    < > = values in this interval not displayed.   RADIOLOGY:  No results found. ASSESSMENT AND PLAN:   #Acute hypoxemic respiratory failure from COPD exacerbation and left lower lobe and middle lobe  pneumonia Initiated tapering of IV Solu-Medrol from 60 mg every 6 hourly to every 12 hourly yesterday.  Nebulizer treatments. Continue antibiotic Urine drug screen negative   #Pneumonia Patient currently on IV vancomycin and cefepime.  MRSA by PCR negative besides on cefepime currently monitor clinically. Leukocytosis secondary to steroids. 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  Speech therapy consult was placed. Pain control with PRN morphine  #Diabetes mellitus Blood sugars elevated I have increased the Lantus   #History of substance abuse Urine drug screen negative  DVT prophylaxis; Lovenox   All the records are reviewed and case discussed with Care Management/Social Worker. Management plans discussed with the patient, family and they are in agreement.  CODE STATUS: Full Code  TOTAL TIME TAKING CARE OF THIS PATIENT: 35 minutes.   More than 50% of the time was spent in counseling/coordination of care: YES  POSSIBLE D/C IN 2 DAYS, DEPENDING ON CLINICAL CONDITION.   Dustin Flock M.D on 09/21/2018 at 1:46 PM  Between 7am to 6pm - Pager - [phone]  After 6pm go to [email or web address] - Proofreader  [facility]  Office  [phone]  CC: Primary care physician; Medicine, [facility]  Note: This dictation was prepared with Dragon dictation along with smaller phrase technology. Any transcriptional errors that result from this process are unintentional.

## 2018-09-21 NOTE — Progress Notes (Signed)
   09/21/18 1500  Clinical Encounter Type  Visited With Patient  Visit Type Follow-up  Referral From Chaplain  Consult/Referral To Chaplain  Recommendations  (It would help patient if Chaplain visit daily)  Spiritual Encounters  Spiritual Needs Emotional;Grief support  Stress Factors  Patient Stress Factors Health changes;Other (Comment)  Chaplain visit patient, she was sitting up in bed. Chaplain introduced herself and patient was happy for visit. 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 Patient spoke of her daughter's working and are not able to visit her, she understood. However, it saddens patient. Patient spoke about the dynamics with her only sister. She said she tires to make peace and will not hold grudges.  Chaplain gave encouraging words of comfort and peace. Patient was a little drowsy so Chaplain ended visit. Patient kept apologizes for nodding off. Chaplain explained that she understood and there was no need to apologize.

## 2018-09-21 NOTE — TOC Progression Note (Signed)
Transition of Care [facility]) - Progression Note    Patient Details  Name: Katie Guzman MRN: [id number] Date of Birth: 1970-06-21  Transition of Care [facility]) CM/SW [place], Katie Guzman Phone Number: 09/21/2018, 1:48 PM  Clinical Narrative: Katie Guzman and Katie Guzman have declined referral. CSW is reaching out to other agencies.    Expected Discharge Plan: Katie Guzman    Expected Discharge Plan and Services Expected Discharge Plan: Katie Guzman In-house Referral: Katie Guzman   Post Acute Care Choice: Durable Medical Equipment, Home Health Living arrangements for the past 2 months: Single Family Home                               Date Katie Guzman: Katie Guzman   Representative spoke with at Katie Guzman: Katie Guzman ([place]) Interventions    Readmission Risk Interventions No flowsheet data found.

## 2018-09-22 LAB — GLUCOSE, CAPILLARY
Glucose-Capillary: 309 mg/dL — ABNORMAL HIGH (ref 70–99)
Glucose-Capillary: 94 mg/dL (ref 70–99)

## 2018-09-22 MED ORDER — ESCITALOPRAM OXALATE 10 MG PO TABS: 20.0000 mg | ORAL_TABLET | Freq: Every morning | ORAL | 5 refills | Status: AC

## 2018-09-22 MED ORDER — PANTOPRAZOLE SODIUM 40 MG PO TBEC: 40.0000 mg | DELAYED_RELEASE_TABLET | Freq: Every day | ORAL | 0 refills | Status: DC

## 2018-09-22 MED ORDER — TIOTROPIUM BROMIDE MONOHYDRATE 18 MCG IN CAPS: 18.0000 ug | ORAL_CAPSULE | Freq: Every day | RESPIRATORY_TRACT | 0 refills | Status: AC

## 2018-09-22 MED ORDER — GABAPENTIN 800 MG PO TABS: 800.0000 mg | ORAL_TABLET | Freq: Three times a day (TID) | ORAL | 1 refills | Status: AC

## 2018-09-22 MED ORDER — FLUTICASONE PROPIONATE 50 MCG/ACT NA SUSP: 1.0000 | Freq: Every day | NASAL | 2 refills | Status: DC

## 2018-09-22 MED ORDER — MIRTAZAPINE 7.5 MG PO TABS: 7.5000 mg | ORAL_TABLET | Freq: Every evening | ORAL | 11 refills | Status: AC

## 2018-09-22 MED ORDER — AMOXICILLIN-POT CLAVULANATE 875-125 MG PO TABS: 1.0000 | ORAL_TABLET | Freq: Two times a day (BID) | ORAL | 0 refills | Status: AC

## 2018-09-22 MED ORDER — OXYCODONE HCL 5 MG PO TABS: 10.0000 mg | ORAL_TABLET | ORAL | 0 refills | Status: DC | PRN

## 2018-09-22 MED ORDER — DEXAMETHASONE 4 MG PO TABS: 4.0000 mg | ORAL_TABLET | Freq: Every day | ORAL | 0 refills | Status: DC

## 2018-09-22 MED ORDER — INSULIN LISPRO 100 UNIT/ML ~~LOC~~ SOLN: 1.0000 [IU] | Freq: Three times a day (TID) | SUBCUTANEOUS | 11 refills | Status: DC

## 2018-09-22 MED ORDER — INSULIN GLARGINE 100 UNIT/ML ~~LOC~~ SOLN: 25.0000 [IU] | Freq: Every day | SUBCUTANEOUS | 11 refills | Status: DC

## 2018-09-22 MED ORDER — ALBUTEROL SULFATE HFA 108 (90 BASE) MCG/ACT IN AERS: 2.0000 | INHALATION_SPRAY | RESPIRATORY_TRACT | 2 refills | Status: DC | PRN

## 2018-09-22 MED ORDER — NICOTINE 21 MG/24HR TD PT24: 21.0000 mg | MEDICATED_PATCH | Freq: Every day | TRANSDERMAL | 0 refills | Status: AC

## 2018-09-22 MED ORDER — ALBUTEROL SULFATE (2.5 MG/3ML) 0.083% IN NEBU: 2.5000 mg | INHALATION_SOLUTION | RESPIRATORY_TRACT | 12 refills | Status: AC | PRN

## 2018-09-22 MED ORDER — FLUTICASONE-SALMETEROL 250-50 MCG/DOSE IN AEPB: 1.0000 | INHALATION_SPRAY | Freq: Two times a day (BID) | RESPIRATORY_TRACT | 1 refills | Status: AC

## 2018-09-22 NOTE — Progress Notes (Signed)
Assisted pt with a phone call.

## 2018-09-22 NOTE — Progress Notes (Signed)
Nsg Discharge Note  Admit Date:  09/18/2018 Discharge date: 09/22/2018   Ethyle Tiedt to be D/C'd Home per MD order.  AVS completed.  Copy for chart, and copy for patient signed, and dated. Patient/caregiver able to verbalize understanding.  Discharge Medication: Allergies as of 09/22/2018      Reactions   Flexeril [cyclobenzaprine] Hives, Nausea And Vomiting   Hydrocodone Hives   Other Hives   Darvocet [propoxyphene N-acetaminophen] Hives, Nausea And Vomiting   Fish-derived Products Other (See Comments)   Patient does not like to eat fish. Not allergic but prefers not to.   Propofol Nausea And Vomiting   Toradol [ketorolac Tromethamine] Nausea And Vomiting   Vicodin [hydrocodone-acetaminophen] Hives, Nausea And Vomiting   Ketorolac Nausea And Vomiting   Tramadol Nausea And Vomiting   Vomiting only      Medication List    TAKE these medications   acetaminophen 325 MG tablet Commonly known as: TYLENOL Take 650 mg by mouth every 6 (six) hours as needed for mild pain.   albuterol 108 (90 Base) MCG/ACT inhaler Commonly known as: VENTOLIN HFA Inhale 2 puffs into the lungs every 4 (four) hours as needed for wheezing or shortness of breath. For wheeze or shortness of breath What changed:   when to take this  reasons to take this   albuterol (2.5 MG/3ML) 0.083% nebulizer solution Commonly known as: PROVENTIL Take 3 mLs (2.5 mg total) by nebulization every 4 (four) hours as needed for wheezing or shortness of breath. What changed: Another medication with the same name was changed. Make sure you understand how and when to take each.   amoxicillin-clavulanate 875-125 MG tablet Commonly known as: Augmentin Take 1 tablet by mouth 2 (two) times daily for 7 days.   ascorbic acid 250 MG tablet Commonly known as: VITAMIN C Take 1 tablet (250 mg total) by mouth 2 (two) times daily.   dexamethasone 4 MG tablet Commonly known as: DECADRON Take 1 tablet (4 mg total) by mouth  daily for 10 days.   escitalopram 10 MG tablet Commonly known as: LEXAPRO Take 2 tablets (20 mg total) by mouth every morning.   fluticasone 50 MCG/ACT nasal spray Commonly known as: FLONASE Place 1 spray into both nostrils daily.   Fluticasone-Salmeterol 250-50 MCG/DOSE Aepb Commonly known as: ADVAIR Inhale 1 puff into the lungs every 12 (twelve) hours.   gabapentin 800 MG tablet Commonly known as: NEURONTIN Take 1 tablet (800 mg total) by mouth 3 (three) times daily.   glycerin adult 2 g suppository Place 1 suppository rectally as needed for constipation.   ibuprofen 800 MG tablet Commonly known as: ADVIL Take 800 mg by mouth every 6 (six) hours as needed for fever or mild pain.   insulin glargine 100 UNIT/ML injection Commonly known as: LANTUS Inject 0.25 mLs (25 Units total) into the skin at bedtime. What changed: how much to take   insulin lispro 100 UNIT/ML injection Commonly known as: HUMALOG Inject 0.01-0.18 mLs (1-18 Units total) into the skin 4 (four) times daily -  before meals and at bedtime. Sliding scale   lidocaine 5 % Commonly known as: LIDODERM Place 1 patch onto the skin daily as needed. For pain. Remove & Discard patch within 12 hours or as directed by MD   mirtazapine 7.5 MG tablet Commonly known as: REMERON Take 1 tablet (7.5 mg total) by mouth Nightly.   nicotine 21 mg/24hr patch Commonly known as: NICODERM CQ - dosed in mg/24 hours Place 1 patch (  21 mg total) onto the skin daily. What changed:   when to take this  reasons to take this   oxyCODONE 5 MG immediate release tablet Commonly known as: Oxy IR/ROXICODONE Take 2 tablets (10 mg total) by mouth every 4 (four) hours as needed for severe pain.   pantoprazole 40 MG tablet Commonly known as: PROTONIX Take 1 tablet (40 mg total) by mouth daily. Start taking on: September 23, 2018   polyethylene glycol 17 g packet Commonly known as: MIRALAX / GLYCOLAX Take 17 g by mouth daily. Mix one  tablespoon with 8oz of your favorite juice or water every day until you are having soft formed stools.   senna-docusate 8.6-50 MG tablet Commonly known as: Senokot-S Take 1 tablet by mouth daily.   tiotropium 18 MCG inhalation capsule Commonly known as: SPIRIVA Place 1 capsule (18 mcg total) into inhaler and inhale daily.            Durable Medical Equipment  (From admission, onward)         Start     Ordered   09/22/18 1001  DME Oxygen  Once    Question Answer Comment  Length of Need Lifetime   Mode or (Route) Nasal cannula   Liters per Minute 3   Frequency Continuous (stationary and portable oxygen unit needed)   Oxygen delivery system Gas      09/22/18 1001          Discharge Assessment: Vitals:   09/22/18 0523 09/22/18 0805  BP: (!) 139/98   Pulse: 97   Resp: 20   Temp: 98.4 F (36.9 C)   SpO2: 91% 93%   Skin clean, dry and intact without evidence of skin break down, no evidence of skin tears noted. IV catheter discontinued intact. Site without signs and symptoms of complications - no redness or edema noted at insertion site, patient denies c/o pain - only slight tenderness at site.  Dressing with slight pressure applied.  D/c Instructions-Education: Discharge instructions given to patient/family with verbalized understanding. D/c education completed with patient/family including follow up instructions, medication list, d/c activities limitations if indicated, with other d/c instructions as indicated by MD - patient able to verbalize understanding, all questions fully answered. Patient instructed to return to ED, call 911, or call MD for any changes in condition.  Patient escorted via [place], and D/C home via private auto.  Eda Keys, RN 09/22/2018 10:37 AM

## 2018-09-22 NOTE — Care Management (Signed)
Patient continues to exhibit signs of hypercapnia associated with chronic respiratory failure secondary to severe COPD. Patient requires the use of NIV both QHS and daytime to help with exacerbation periods. The use of the NIV will treat patient's high PCO2 levels and can reduce risk of exacerbations and future hospitalizations when used at night and during the day. Pt will need these advanced settings in conjunction with her current medication regimen; BIPAP is not an option due to its functional limitations and the severity of the patient's condition. Failure to have NIV available for use over a 24 hour period could lead to death. Patient able to maintain their own airway and clear their own secretions.

## 2018-09-22 NOTE — Progress Notes (Signed)
   09/22/18 1500  Clinical Encounter Type  Visited With Patient and family together  Visit Type Follow-up;Spiritual support  Referral From Chaplain  Consult/Referral To Chaplain  Spiritual Encounters  Spiritual Needs Emotional  Stress Factors  Patient Stress Factors Family relationships;Health changes  Chaplain follow up visit with patient and patient was packing to go home. Chaplain gave patient well wishes and patient express her gratitude for the visits from the Chaplain's. The visit  were very heartfelt and needed patient stated.

## 2018-09-22 NOTE — Progress Notes (Signed)
SATURATION QUALIFICATIONS: (This note is used to comply with regulatory documentation for home oxygen)  Patient Saturations on Room Air at Rest = 92%  Patient Saturations on Room Air while Ambulating = 85%  Patient Saturations on 2 Liters of oxygen while Ambulating = 95%  Please briefly explain why patient needs home oxygen: 

## 2018-09-22 NOTE — Progress Notes (Signed)
Inpatient Diabetes Program Recommendations  AACE/ADA: New Consensus Statement on Inpatient Glycemic Control   Target Ranges:  Prepandial:   less than 140 mg/dL      Peak postprandial:   less than 180 mg/dL (1-2 hours)      Critically ill patients:  140 - 180 mg/dL   Results for JERSEY, ESPINOZA (MRN [id number]) as of 09/22/2018 10:12  Ref. Range 09/21/2018 07:48 09/21/2018 11:43 09/21/2018 16:46 09/21/2018 21:37 09/22/2018 07:37  Glucose-Capillary Latest Ref Range: 70 - 99 mg/dL 437 (H)  Novolg 30 units 76  Novolog 10 units 225 (H)  Novolg 17 units 235 (H)  Novolg 2 units  Lantus 30 units 309 (H)  Novolg 25 units   Review of Glycemic Control  Outpatient Diabetes medications:Lantus 25 units QHS, Humalog 1-18 units QID Current orders for Inpatient glycemic control:Lantus 30 units QHS, Novolog 0-20 units TID with meals, Novolog 0-5 units QHS, Novolog 10 units TID with meals; Solumedrol 60 mg Q12H  Inpatient Diabetes Program Recommendations:  Insulin - Basal:  If steroids continued, please consider increasing Lantus to 35 units QHS.  Insulin - Meal Coverage: If steroids are continued, please consider increasing meal coverage to Novolog 15 units TID with meals if patient eats at least 50% of meals.  Thanks, Barnie Alderman, RN, MSN, CDE Diabetes Coordinator Inpatient Diabetes Program [phone] (Team Pager from 8am to 5pm)

## 2018-09-22 NOTE — Progress Notes (Signed)
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

## 2018-09-22 NOTE — Progress Notes (Signed)
Nsg Discharge Note  Admit Date:  09/18/2018 Discharge date: 09/22/2018   Arcelia Pals to be D/C'd Home per MD order.  AVS completed.  Copy for chart, and copy for patient signed, and dated. Patient/caregiver able to verbalize understanding.  Discharge Medication: Allergies as of 09/22/2018      Reactions   Flexeril [cyclobenzaprine] Hives, Nausea And Vomiting   Hydrocodone Hives   Other Hives   Darvocet [propoxyphene N-acetaminophen] Hives, Nausea And Vomiting   Fish-derived Products Other (See Comments)   Patient does not like to eat fish. Not allergic but prefers not to.   Propofol Nausea And Vomiting   Toradol [ketorolac Tromethamine] Nausea And Vomiting   Vicodin [hydrocodone-acetaminophen] Hives, Nausea And Vomiting   Ketorolac Nausea And Vomiting   Tramadol Nausea And Vomiting   Vomiting only      Medication List    TAKE these medications   acetaminophen 325 MG tablet Commonly known as: TYLENOL Take 650 mg by mouth every 6 (six) hours as needed for mild pain.   albuterol 108 (90 Base) MCG/ACT inhaler Commonly known as: VENTOLIN HFA Inhale 2 puffs into the lungs every 4 (four) hours as needed for wheezing or shortness of breath. For wheeze or shortness of breath What changed:   when to take this  reasons to take this   albuterol (2.5 MG/3ML) 0.083% nebulizer solution Commonly known as: PROVENTIL Take 3 mLs (2.5 mg total) by nebulization every 4 (four) hours as needed for wheezing or shortness of breath. What changed: Another medication with the same name was changed. Make sure you understand how and when to take each.   amoxicillin-clavulanate 875-125 MG tablet Commonly known as: Augmentin Take 1 tablet by mouth 2 (two) times daily for 7 days.   ascorbic acid 250 MG tablet Commonly known as: VITAMIN C Take 1 tablet (250 mg total) by mouth 2 (two) times daily.   dexamethasone 4 MG tablet Commonly known as: DECADRON Take 1 tablet (4 mg total) by mouth  daily for 10 days.   escitalopram 10 MG tablet Commonly known as: LEXAPRO Take 2 tablets (20 mg total) by mouth every morning.   fluticasone 50 MCG/ACT nasal spray Commonly known as: FLONASE Place 1 spray into both nostrils daily.   Fluticasone-Salmeterol 250-50 MCG/DOSE Aepb Commonly known as: ADVAIR Inhale 1 puff into the lungs every 12 (twelve) hours.   gabapentin 800 MG tablet Commonly known as: NEURONTIN Take 1 tablet (800 mg total) by mouth 3 (three) times daily.   glycerin adult 2 g suppository Place 1 suppository rectally as needed for constipation.   ibuprofen 800 MG tablet Commonly known as: ADVIL Take 800 mg by mouth every 6 (six) hours as needed for fever or mild pain.   insulin glargine 100 UNIT/ML injection Commonly known as: LANTUS Inject 0.25 mLs (25 Units total) into the skin at bedtime. What changed: how much to take   insulin lispro 100 UNIT/ML injection Commonly known as: HUMALOG Inject 0.01-0.18 mLs (1-18 Units total) into the skin 4 (four) times daily -  before meals and at bedtime. Sliding scale   lidocaine 5 % Commonly known as: LIDODERM Place 1 patch onto the skin daily as needed. For pain. Remove & Discard patch within 12 hours or as directed by MD   mirtazapine 7.5 MG tablet Commonly known as: REMERON Take 1 tablet (7.5 mg total) by mouth Nightly.   nicotine 21 mg/24hr patch Commonly known as: NICODERM CQ - dosed in mg/24 hours Place 1 patch (  21 mg total) onto the skin daily. What changed:   when to take this  reasons to take this   oxyCODONE 5 MG immediate release tablet Commonly known as: Oxy IR/ROXICODONE Take 2 tablets (10 mg total) by mouth every 4 (four) hours as needed for severe pain.   pantoprazole 40 MG tablet Commonly known as: PROTONIX Take 1 tablet (40 mg total) by mouth daily. Start taking on: September 23, 2018   polyethylene glycol 17 g packet Commonly known as: MIRALAX / GLYCOLAX Take 17 g by mouth daily. Mix one  tablespoon with 8oz of your favorite juice or water every day until you are having soft formed stools.   senna-docusate 8.6-50 MG tablet Commonly known as: Senokot-S Take 1 tablet by mouth daily.   tiotropium 18 MCG inhalation capsule Commonly known as: SPIRIVA Place 1 capsule (18 mcg total) into inhaler and inhale daily.            Durable Medical Equipment  (From admission, onward)         Start     Ordered   09/22/18 1001  DME Oxygen  Once    Question Answer Comment  Length of Need Lifetime   Mode or (Route) Nasal cannula   Liters per Minute 3   Frequency Continuous (stationary and portable oxygen unit needed)   Oxygen delivery system Gas      09/22/18 1001          Discharge Assessment: Vitals:   09/22/18 1101 09/22/18 1338  BP:    Pulse:    Resp:    Temp:    SpO2: 95% 95%   Skin clean, dry and intact without evidence of skin break down, no evidence of skin tears noted. IV catheter discontinued intact. Site without signs and symptoms of complications - no redness or edema noted at insertion site, patient denies c/o pain - only slight tenderness at site.  Dressing with slight pressure applied.  D/c Instructions-Education: Discharge instructions given to patient/family with verbalized understanding. D/c education completed with patient/family including follow up instructions, medication list, d/c activities limitations if indicated, with other d/c instructions as indicated by MD - patient able to verbalize understanding, all questions fully answered. Patient instructed to return to ED, call 911, or call MD for any changes in condition.  Patient escorted via [place], and D/C home via private auto.  Eda Keys, RN 09/22/2018 2:06 PM

## 2018-09-22 NOTE — Discharge Summary (Signed)
[employer] - [place] at [facility], [place] y.o., DOB Apr 10, 1970, MRN [id number]. Admission date: 09/18/2018 Discharge Date 09/22/2018 Primary MD Medicine, [place] Gouru, MD  Admission Diagnosis  Hypercarbia [R06.89] SOB (shortness of breath) [R06.02] Pneumonia of right lung due to infectious organism, unspecified part of lung [J18.9]  Discharge Diagnosis   Active Problems: Acute on chronic hypoxic and hypercarbic respiratory failure due to severe COPD Pneumonia Laryngeal cancer Diabetes type 2 History of substance abuse   Katie Guzman  is a 48 y.o. female with a known COPD, diabetes mellitus, neuropathy and other medical problems was extremely short of breath and as shortness of breath has been worsening EMS were called and patient was placed on nonrebreather and brought into the emergency department.  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  Patient was complaining of dyspnea and unable to read she is placed on BiPAP.  Patient was in ICU and subsequently transferred to the floor.  She had required intubation recently as well.  Patient was treated for pneumonia.  She does have laryngeal cancer and significant secretions as well she also has a lot of reflux and regurgitation of her food.  All likely related to her laryngeal cancer.  Patient needs to discuss with the primary oncologist at [facility] to see whether she needs an endoscopy with a possible PEG tube placement..  We have arranged patient to have a trilogy machine at home            Consults  None  Significant Tests:  See full reports for all details     Dg Abdomen 1 View  Result Date: 09/04/2018 CLINICAL DATA:  48 year old female with history of abdominal pain and constipation for the past week. EXAM: ABDOMEN - 1 VIEW COMPARISON:  No priors. FINDINGS: Gas and stool are seen scattered throughout the colon  extending to the level of the distal rectum. No pathologic distension of small bowel is noted. No gross evidence of pneumoperitoneum. Orthopedic fixation hardware in the lower lumbar spine and sacrum, similar to prior studies. IMPRESSION: 1. Nonobstructive bowel gas pattern. 2. No pneumoperitoneum. Electronically Signed   By: Vinnie Langton M.D.   On: 09/04/2018 08:29   Ct Head Wo Contrast  Result Date: 08/24/2018 CLINICAL DATA:  Altered level of consciousness.  Confusion. EXAM: CT HEAD WITHOUT CONTRAST TECHNIQUE: Contiguous axial images were obtained from the base of the skull through the vertex without intravenous contrast. COMPARISON:  11/18/2014 FINDINGS: Brain: Suboptimal due to motion and atypical patient positioning. No sign acute infarction, hemorrhage, mass, hydrocephalus or extra-axial collection. Vascular: There is atherosclerotic calcification of the major vessels at the base of the brain. Skull: Negative Sinuses/Orbits: Clear/negative Other: None IMPRESSION: Motion degraded exam with atypical positioning. No abnormality is seen. Electronically Signed   By: Nelson Chimes M.D.   On: 08/24/2018 21:46   Ct Abdomen Pelvis W Contrast  Result Date: 09/04/2018 CLINICAL DATA:  Generalized abdominal pain with constipation. Bowel obstruction, high-grade EXAM: CT ABDOMEN AND PELVIS WITH CONTRAST TECHNIQUE: Multidetector CT imaging of the abdomen and pelvis was performed using the standard protocol following bolus administration of intravenous contrast. CONTRAST:  181mL OMNIPAQUE IOHEXOL 300 MG/ML  SOLN COMPARISON:  12/08/2015 abdominal CT FINDINGS: Lower chest:  No contributory findings. Hepatobiliary: No focal liver abnormality.Full gallbladder without calcified stone or regional inflammation. New intra and extrahepatic bile duct dilatation with 1 cm diameter distal common bile duct. Pancreas: Unremarkable. Spleen: Unremarkable.  Adrenals/Urinary Tract: 3.3 cm left adrenal mass is more heterogeneous than  usually seen with adenoma, with areas of calcification present, but no significant growth since 2015 when it measured 3 cm. No hydronephrosis or stone. Distended urinary bladder likely related to below. Stomach/Bowel: Rectal distention by stool measuring nearly 9 cm in diameter. Mesorectal edema. Generalized stool retention. No small bowel obstruction. Vascular/Lymphatic: No acute vascular abnormality. Severe atherosclerotic plaque of the lower aorta and bilateral iliacs. No mass or adenopathy. Reproductive:Negative. Other: No ascites or pneumoperitoneum. Musculoskeletal: L4-5 and L5-S1 posterior-lateral arthrodesis. Sacrifice screws are present in the S1 body. Adjacent segment facet degeneration at L3-4. IMPRESSION: 1. Rectal impaction with stercoral colitis and bladder distention. 2. Dilated gallbladder and bile ducts that is new from 2017, please correlate with liver function tests and consider sonography. 3. Prominent atherosclerosis for age. 4. Heterogeneous left adrenal mass without significant growth since 2015. Electronically Signed   By: Monte Fantasia M.D.   On: 09/04/2018 11:40   Dg Chest Portable 1 View  Result Date: 09/18/2018 CLINICAL DATA:  Severe dyspnea.  COPD.  Laryngeal mass. EXAM: PORTABLE CHEST 1 VIEW COMPARISON:  09/09/2018 chest radiograph. FINDINGS: Stable cardiomediastinal silhouette with normal heart size. No pneumothorax. No pleural effusion. Patchy opacity in the peripheral mid to lower right lung appears new. IMPRESSION: Patchy peripheral mid to lower right lung opacity appears new, favoring pneumonia. Close chest radiograph follow-up advised. Electronically Signed   By: Ilona Sorrel M.D.   On: 09/18/2018 07:06   Dg Chest Portable 1 View  Result Date: 09/09/2018 CLINICAL DATA:  Post intubation EXAM: PORTABLE CHEST 1 VIEW COMPARISON:  07/28/2018 FINDINGS: Endotracheal tube terminates 2.7 cm above the carina. Mild patchy lingular/left lower lobe opacities, suspicious for  pneumonia/aspiration. Mild right infrahilar opacity. Nodular opacity at the lateral right lower hemithorax likely reflects a nipple shadow or overlying chest lead. The heart is normal in size. IMPRESSION: Endotracheal tube terminates 2.7 cm above the carina. Mild patchy lingular/left lower lobe opacities, suspicious for pneumonia/aspiration. Electronically Signed   By: Julian Hy M.D.   On: 09/09/2018 01:15   Dg Abd Portable 1v  Result Date: 09/09/2018 CLINICAL DATA:  NG tube placement EXAM: PORTABLE ABDOMEN - 1 VIEW COMPARISON:  CT 09/04/2018, 09/04/2018 radiograph FINDINGS: Right infrahilar atelectasis or infiltrate. Esophageal tube tip overlies the gastric body. IMPRESSION: Esophageal tube tip overlies gastric body Electronically Signed   By: Donavan Foil M.D.   On: 09/09/2018 01:43       Today   Subjective:   Katie Guzman patient doing much better shortness of breath much improved is anxious Objective:   Blood pressure (!) 139/98, pulse 97, temperature 98.4 F (36.9 C), temperature source Oral, resp. rate 20, height 5' (1.524 m), weight 51.9 kg, SpO2 95 %.  .  Intake/Output Summary (Last 24 hours) at 09/22/2018 1340 Last data filed at 09/22/2018 0300 Gross per 24 hour  Intake 1123.35 ml  Output -  Net 1123.35 ml    Exam VITAL SIGNS: Blood pressure (!) 139/98, pulse 97, temperature 98.4 F (36.9 C), temperature source Oral, resp. rate 20, height 5' (1.524 m), weight 51.9 kg, SpO2 95 %.  GENERAL:  48 y.o.-year-old patient lying in the bed with no acute distress.  EYES: Pupils equal, round, reactive to light and accommodation. No scleral icterus. Extraocular muscles intact.  HEENT: Head atraumatic, normocephalic. Oropharynx and nasopharynx clear.  NECK:  Supple, no jugular venous distention. No thyroid enlargement, no tenderness.  LUNGS: Normal breath sounds bilaterally, no wheezing, rales,rhonchi  or crepitation. No use of accessory muscles of respiration.   CARDIOVASCULAR: S1, S2 normal. No murmurs, rubs, or gallops.  ABDOMEN: Soft, nontender, nondistended. Bowel sounds present. No organomegaly or mass.  EXTREMITIES: No pedal edema, cyanosis, or clubbing.  NEUROLOGIC: Cranial nerves II through XII are intact. Muscle strength 5/5 in all extremities. Sensation intact. Gait not checked.  PSYCHIATRIC: The patient is alert and oriented x 3.  SKIN: No obvious rash, lesion, or ulcer.   Data Review     CBC w Diff:  Lab Results  Component Value Date   WBC 13.4 (H) 09/19/2018   HGB 12.4 09/19/2018   HGB 14.9 09/05/2013   HCT 39.3 09/19/2018   HCT 45.5 09/05/2013   PLT 492 (H) 09/19/2018   PLT 298 09/05/2013   LYMPHOPCT 6 09/18/2018   LYMPHOPCT 18.8 09/05/2013   MONOPCT 7 09/18/2018   MONOPCT 6.5 09/05/2013   EOSPCT 3 09/18/2018   EOSPCT 2.7 09/05/2013   BASOPCT 0 09/18/2018   BASOPCT 2.4 09/05/2013   CMP:  Lab Results  Component Value Date   NA 133 (L) 09/21/2018   NA 137 09/05/2013   K 5.2 (H) 09/21/2018   K 4.0 09/05/2013   CL 92 (L) 09/21/2018   CL 100 09/05/2013   CO2 33 (H) 09/21/2018   CO2 29 09/05/2013   BUN 32 (H) 09/21/2018   BUN 12 09/05/2013   CREATININE 0.65 09/21/2018   CREATININE 0.79 09/05/2013   PROT 6.9 09/19/2018   PROT 6.5 09/05/2013   ALBUMIN 3.3 (L) 09/19/2018   ALBUMIN 3.1 (L) 09/05/2013   BILITOT 0.4 09/19/2018   BILITOT 0.2 09/05/2013   ALKPHOS 104 09/19/2018   ALKPHOS 124 (H) 09/05/2013   AST 11 (L) 09/19/2018   AST 13 (L) 09/05/2013   ALT 8 09/19/2018   ALT 23 09/05/2013  .  Micro Results Recent Results (from the past 240 hour(s))  SARS Coronavirus 2 [facility] order, Performed in [facility] hospital lab) Nasopharyngeal Nasopharyngeal Swab     Status: None   Collection Time: 09/18/18  6:46 AM   Specimen: Nasopharyngeal Swab  Result Value Ref Range Status   SARS Coronavirus 2 NEGATIVE NEGATIVE Final    Comment: (NOTE) If result is NEGATIVE SARS-CoV-2 target nucleic acids are NOT  DETECTED. The SARS-CoV-2 RNA is generally detectable in upper and lower  respiratory specimens during the acute phase of infection. The lowest  concentration of SARS-CoV-2 viral copies this assay can detect is 250  copies / mL. A negative result does not preclude SARS-CoV-2 infection  and should not be used as the sole basis for treatment or other  patient management decisions.  A negative result may occur with  improper specimen collection / handling, submission of specimen other  than nasopharyngeal swab, presence of viral mutation(s) within the  areas targeted by this assay, and inadequate number of viral copies  (<250 copies / mL). A negative result must be combined with clinical  observations, patient history, and epidemiological information. If result is POSITIVE SARS-CoV-2 target nucleic acids are DETECTED. The SARS-CoV-2 RNA is generally detectable in upper and lower  respiratory specimens dur ing the acute phase of infection.  Positive  results are indicative of active infection with SARS-CoV-2.  Clinical  correlation with patient history and other diagnostic information is  necessary to determine patient infection status.  Positive results do  not rule out bacterial infection or co-infection with other viruses. If result is PRESUMPTIVE POSTIVE SARS-CoV-2 nucleic acids MAY BE PRESENT.  A presumptive positive result was obtained on the submitted specimen  and confirmed on repeat testing.  While 2019 novel coronavirus  (SARS-CoV-2) nucleic acids may be present in the submitted sample  additional confirmatory testing may be necessary for epidemiological  and / or clinical management purposes  to differentiate between  SARS-CoV-2 and other Sarbecovirus currently known to infect humans.  If clinically indicated additional testing with an alternate test  methodology [phone]) is advised. The SARS-CoV-2 RNA is generally  detectable in upper and lower respiratory sp ecimens during  the acute  phase of infection. The expected result is Negative. Fact Sheet for Patients:  StrictlyIdeas.no Fact Sheet for Healthcare Providers: BankingDealers.co.za This test is not yet approved or cleared by the [place] FDA and has been authorized for detection and/or diagnosis of SARS-CoV-2 by FDA under an Emergency Use Authorization (EUA).  This EUA will remain in effect (meaning this test can be used) for the duration of the COVID-19 declaration under Section 564(b)(1) of the Act, 21 U.S.C. section 360bbb-3(b)(1), unless the authorization is terminated or revoked sooner. Performed at [facility], [address] [place], [place] [address]   Culture, blood (routine x 2)     Status: None (Preliminary result)   Collection Time: 09/18/18  7:56 AM   Specimen: BLOOD  Result Value Ref Range Status   Specimen Description BLOOD BLOOD RIGHT HAND  Final   Special Requests   Final    BOTTLES DRAWN AEROBIC AND ANAEROBIC Blood Culture adequate volume   Culture   Final    NO GROWTH 4 DAYS Performed at [facility], [address] [place], [place] [address]    Report Status PENDING  Incomplete  Culture, blood (routine x 2)     Status: None (Preliminary result)   Collection Time: 09/18/18  7:56 AM   Specimen: BLOOD  Result Value Ref Range Status   Specimen Description BLOOD BLOOD LEFT FOREARM  Final   Special Requests   Final    BOTTLES DRAWN AEROBIC AND ANAEROBIC Blood Culture adequate volume   Culture   Final    NO GROWTH 4 DAYS Performed at [facility], [address] [place], [place] [address]    Report Status PENDING  Incomplete  MRSA PCR Screening     Status: None   Collection Time: 09/18/18  9:46 AM   Specimen: Nasopharyngeal  Result Value Ref Range Status   MRSA by PCR NEGATIVE NEGATIVE Final    Comment:        The GeneXpert MRSA Assay (FDA approved for NASAL specimens only), is one  component of a comprehensive MRSA colonization surveillance program. It is not intended to diagnose MRSA infection nor to guide or monitor treatment for MRSA infections. Performed at [facility], [address] [place], [place] [address]         Code Status Orders  (From admission, onward)         Start     Ordered   09/18/18 0955  Full code  Continuous     09/18/18 0954        Code Status History    Date Active Date Inactive Code Status Order ID Comments User Context   09/09/2018 0201 09/09/2018 1712 Full Code [id number]  Arnell Asal, NP ED   08/18/2018 1815 08/25/2018 1635 Full Code [id number]  Lang Snow, NP Inpatient   07/29/2018 0158 07/31/2018 1311 Full Code [id number]  Mayer Camel, NP ED   03/28/2017 [phone]  03/28/2017 1338 Full Code [id number]  Cristy Folks, MD Inpatient   08/23/2016 0507 08/24/2016 0530 Full Code [id number]  Jani Gravel, MD Inpatient   04/15/2015 1411 04/17/2015 1736 Full Code [id number]  Elease Hashimoto ED   10/19/2014 1641 10/23/2014 2140 Full Code [id number]  Henreitta Leber, MD Inpatient   Advance Care Planning Activity          Follow-up Information    Medicine, [place] [facility] Follow up in 6 day(s).   Contact information: [place] [id number] [phone]        Sharlotte Alamo, DPM Follow up in 1 week(s).   Specialty: Podiatry Why: foot infection Contact information: [place] [address] [phone]           Discharge Medications   Allergies as of 09/22/2018      Reactions   Flexeril [cyclobenzaprine] Hives, Nausea And Vomiting   Hydrocodone Hives   Other Hives   Darvocet [propoxyphene N-acetaminophen] Hives, Nausea And Vomiting   Fish-derived Products Other (See Comments)   Patient does not like to eat fish. Not allergic but prefers not to.   Propofol Nausea And Vomiting   Toradol [ketorolac Tromethamine] Nausea And Vomiting   Vicodin  [hydrocodone-acetaminophen] Hives, Nausea And Vomiting   Ketorolac Nausea And Vomiting   Tramadol Nausea And Vomiting   Vomiting only      Medication List    TAKE these medications   acetaminophen 325 MG tablet Commonly known as: TYLENOL Take 650 mg by mouth every 6 (six) hours as needed for mild pain.   albuterol 108 (90 Base) MCG/ACT inhaler Commonly known as: VENTOLIN HFA Inhale 2 puffs into the lungs every 4 (four) hours as needed for wheezing or shortness of breath. For wheeze or shortness of breath What changed:   when to take this  reasons to take this   albuterol (2.5 MG/3ML) 0.083% nebulizer solution Commonly known as: PROVENTIL Take 3 mLs (2.5 mg total) by nebulization every 4 (four) hours as needed for wheezing or shortness of breath. What changed: Another medication with the same name was changed. Make sure you understand how and when to take each.   amoxicillin-clavulanate 875-125 MG tablet Commonly known as: Augmentin Take 1 tablet by mouth 2 (two) times daily for 7 days.   ascorbic acid 250 MG tablet Commonly known as: VITAMIN C Take 1 tablet (250 mg total) by mouth 2 (two) times daily.   dexamethasone 4 MG tablet Commonly known as: DECADRON Take 1 tablet (4 mg total) by mouth daily for 10 days.   escitalopram 10 MG tablet Commonly known as: LEXAPRO Take 2 tablets (20 mg total) by mouth every morning.   fluticasone 50 MCG/ACT nasal spray Commonly known as: FLONASE Place 1 spray into both nostrils daily.   Fluticasone-Salmeterol 250-50 MCG/DOSE Aepb Commonly known as: ADVAIR Inhale 1 puff into the lungs every 12 (twelve) hours.   gabapentin 800 MG tablet Commonly known as: NEURONTIN Take 1 tablet (800 mg total) by mouth 3 (three) times daily.   glycerin adult 2 g suppository Place 1 suppository rectally as needed for constipation.   ibuprofen 800 MG tablet Commonly known as: ADVIL Take 800 mg by mouth every 6 (six) hours as needed for fever or  mild pain.   insulin glargine 100 UNIT/ML injection Commonly known as: LANTUS Inject 0.25 mLs (25 Units total) into the skin at bedtime. What changed: how much to take   insulin lispro 100 UNIT/ML  injection Commonly known as: HUMALOG Inject 0.01-0.18 mLs (1-18 Units total) into the skin 4 (four) times daily -  before meals and at bedtime. Sliding scale   lidocaine 5 % Commonly known as: LIDODERM Place 1 patch onto the skin daily as needed. For pain. Remove & Discard patch within 12 hours or as directed by MD   mirtazapine 7.5 MG tablet Commonly known as: REMERON Take 1 tablet (7.5 mg total) by mouth Nightly.   nicotine 21 mg/24hr patch Commonly known as: NICODERM CQ - dosed in mg/24 hours Place 1 patch (21 mg total) onto the skin daily. What changed:   when to take this  reasons to take this   oxyCODONE 5 MG immediate release tablet Commonly known as: Oxy IR/ROXICODONE Take 2 tablets (10 mg total) by mouth every 4 (four) hours as needed for severe pain.   pantoprazole 40 MG tablet Commonly known as: PROTONIX Take 1 tablet (40 mg total) by mouth daily. Start taking on: September 23, 2018   polyethylene glycol 17 g packet Commonly known as: MIRALAX / GLYCOLAX Take 17 g by mouth daily. Mix one tablespoon with 8oz of your favorite juice or water every day until you are having soft formed stools.   senna-docusate 8.6-50 MG tablet Commonly known as: Senokot-S Take 1 tablet by mouth daily.   tiotropium 18 MCG inhalation capsule Commonly known as: SPIRIVA Place 1 capsule (18 mcg total) into inhaler and inhale daily.            Durable Medical Equipment  (From admission, onward)         Start     Ordered   09/22/18 1001  DME Oxygen  Once    Question Answer Comment  Length of Need Lifetime   Mode or (Route) Nasal cannula   Liters per Minute 3   Frequency Continuous (stationary and portable oxygen unit needed)   Oxygen delivery system Gas      09/22/18 1001              Total Time in preparing paper work, data evaluation and todays exam - 44 minutes  Dustin Flock M.D on 09/22/2018 at 1:40 PM [place]  [phone]

## 2018-09-22 NOTE — TOC Transition Note (Signed)
Transition of Care [facility]) - CM/SW Discharge Note   Patient Details  Name: Katie Guzman MRN: [id number] Date of Birth: 06-22-1970  [facility] CM/SW Contact:  Beverly Sessions, RN Phone Number: 09/22/2018, 3:17 PM   Clinical Narrative:    Patient to discharge today Patient qualifies for continuous home O2 and Trilogy.   Portable O2 delivered to home prior to discharge by Brad from [employer]  Daughter to transport at discharge.   Trilogy to be delivered to home  Patient agreeable to home health services.  Due to staffing and patients payor 6 home health agencies have declined the home health referral.  Referral made to Helene Kelp at [facility].  There will be a delay of start of care until Monday or Tuesday of next week.  MD notified.  Per MD patient to complete her daily wet to dry dressings. Bedside RN to provide enough dressings for patient to be able to complete her dressings until home health is able to assess.  Patient will also have support of RT from [place] in the interim.   Patient and MD agreeable to plan      Final next level of care: Home w Home Health Services Barriers to Discharge: Barriers Resolved   Patient Goals and CMS Choice   CMS Medicare.gov Compare Post Acute Care list provided to:: Patient Choice offered to / list presented to : Patient  Discharge Placement                       Discharge Plan and Services In-house Referral: Chaplain   Post Acute Care Choice: Durable Medical Equipment, Home Health          DME Arranged: NIV, Oxygen DME Agency: [employer] Date DME Agency Contacted: 09/22/18 Time DME Agency Contacted: [phone] Representative spoke with at DME Agency: [place]: RN, PT, Nurse's Aide, Social Work [employer] Agency: [facility] [employer] [facility] Date [place]: 09/22/18 Time [place]: 1000 Representative spoke with at [place]: [place] ([place]) Interventions     Readmission Risk Interventions No flowsheet data found.

## 2018-09-22 NOTE — Discharge Instructions (Signed)

## 2018-09-22 NOTE — Progress Notes (Signed)
Provided pt with 4 kerlix, 4 sterile gauze, 2 tape, and sterile saline bullets. Pt denies further needs.

## 2018-09-22 NOTE — Progress Notes (Signed)
Patient was discharged.  She was taken out in a wheelchair escorted by nurse and NT.

## 2018-09-22 NOTE — Progress Notes (Signed)
Pt reports a wound on her left great toe that she says was "cut open" by Dr Cleda Mccreedy in the office prior to her arriving to the hospital. she is refusing to let staff dress it. She reports "green drainage". This RN messaged Dr Posey Pronto via epic chat regarding above. MD states pt is to be discharged today and will need to follow up outpt. No further orders at this time.

## 2018-09-23 LAB — CULTURE, BLOOD (ROUTINE X 2)
Culture: NO GROWTH
Culture: NO GROWTH
Special Requests: ADEQUATE
Special Requests: ADEQUATE

## 2018-10-02 ENCOUNTER — Encounter: Payer: Self-pay | Admitting: Emergency Medicine

## 2018-10-02 ENCOUNTER — Emergency Department: Payer: Medicaid Other

## 2018-10-02 ENCOUNTER — Other Ambulatory Visit: Payer: Self-pay

## 2018-10-02 ENCOUNTER — Inpatient Hospital Stay
Admission: EM | Admit: 2018-10-02 | Discharge: 2018-10-11 | DRG: 193 | Disposition: E | Payer: Medicaid Other | Attending: Internal Medicine | Admitting: Internal Medicine

## 2018-10-02 DIAGNOSIS — Z515 Encounter for palliative care: Secondary | ICD-10-CM | POA: Diagnosis present

## 2018-10-02 DIAGNOSIS — R1310 Dysphagia, unspecified: Secondary | ICD-10-CM | POA: Diagnosis present

## 2018-10-02 DIAGNOSIS — Z66 Do not resuscitate: Secondary | ICD-10-CM | POA: Diagnosis present

## 2018-10-02 DIAGNOSIS — Z79899 Other long term (current) drug therapy: Secondary | ICD-10-CM

## 2018-10-02 DIAGNOSIS — Z79891 Long term (current) use of opiate analgesic: Secondary | ICD-10-CM

## 2018-10-02 DIAGNOSIS — F419 Anxiety disorder, unspecified: Secondary | ICD-10-CM | POA: Diagnosis present

## 2018-10-02 DIAGNOSIS — Z89421 Acquired absence of other right toe(s): Secondary | ICD-10-CM | POA: Diagnosis not present

## 2018-10-02 DIAGNOSIS — Z801 Family history of malignant neoplasm of trachea, bronchus and lung: Secondary | ICD-10-CM

## 2018-10-02 DIAGNOSIS — Z923 Personal history of irradiation: Secondary | ICD-10-CM | POA: Diagnosis not present

## 2018-10-02 DIAGNOSIS — J440 Chronic obstructive pulmonary disease with acute lower respiratory infection: Secondary | ICD-10-CM | POA: Diagnosis present

## 2018-10-02 DIAGNOSIS — J9601 Acute respiratory failure with hypoxia: Secondary | ICD-10-CM

## 2018-10-02 DIAGNOSIS — J449 Chronic obstructive pulmonary disease, unspecified: Secondary | ICD-10-CM

## 2018-10-02 DIAGNOSIS — Z9981 Dependence on supplemental oxygen: Secondary | ICD-10-CM | POA: Diagnosis not present

## 2018-10-02 DIAGNOSIS — J9600 Acute respiratory failure, unspecified whether with hypoxia or hypercapnia: Secondary | ICD-10-CM | POA: Diagnosis present

## 2018-10-02 DIAGNOSIS — Y842 Radiological procedure and radiotherapy as the cause of abnormal reaction of the patient, or of later complication, without mention of misadventure at the time of the procedure: Secondary | ICD-10-CM | POA: Diagnosis present

## 2018-10-02 DIAGNOSIS — Z85118 Personal history of other malignant neoplasm of bronchus and lung: Secondary | ICD-10-CM

## 2018-10-02 DIAGNOSIS — F17210 Nicotine dependence, cigarettes, uncomplicated: Secondary | ICD-10-CM | POA: Diagnosis present

## 2018-10-02 DIAGNOSIS — F1110 Opioid abuse, uncomplicated: Secondary | ICD-10-CM | POA: Diagnosis present

## 2018-10-02 DIAGNOSIS — Z91013 Allergy to seafood: Secondary | ICD-10-CM

## 2018-10-02 DIAGNOSIS — E1140 Type 2 diabetes mellitus with diabetic neuropathy, unspecified: Secondary | ICD-10-CM | POA: Diagnosis not present

## 2018-10-02 DIAGNOSIS — Y95 Nosocomial condition: Secondary | ICD-10-CM | POA: Diagnosis present

## 2018-10-02 DIAGNOSIS — Z888 Allergy status to other drugs, medicaments and biological substances status: Secondary | ICD-10-CM | POA: Diagnosis not present

## 2018-10-02 DIAGNOSIS — Z791 Long term (current) use of non-steroidal anti-inflammatories (NSAID): Secondary | ICD-10-CM

## 2018-10-02 DIAGNOSIS — M797 Fibromyalgia: Secondary | ICD-10-CM | POA: Diagnosis present

## 2018-10-02 DIAGNOSIS — R410 Disorientation, unspecified: Secondary | ICD-10-CM | POA: Diagnosis present

## 2018-10-02 DIAGNOSIS — R451 Restlessness and agitation: Secondary | ICD-10-CM | POA: Diagnosis present

## 2018-10-02 DIAGNOSIS — J9602 Acute respiratory failure with hypercapnia: Secondary | ICD-10-CM

## 2018-10-02 DIAGNOSIS — J441 Chronic obstructive pulmonary disease with (acute) exacerbation: Secondary | ICD-10-CM | POA: Diagnosis present

## 2018-10-02 DIAGNOSIS — J189 Pneumonia, unspecified organism: Principal | ICD-10-CM

## 2018-10-02 DIAGNOSIS — E871 Hypo-osmolality and hyponatremia: Secondary | ICD-10-CM | POA: Diagnosis present

## 2018-10-02 DIAGNOSIS — G8929 Other chronic pain: Secondary | ICD-10-CM | POA: Diagnosis present

## 2018-10-02 DIAGNOSIS — Z7189 Other specified counseling: Secondary | ICD-10-CM | POA: Diagnosis not present

## 2018-10-02 DIAGNOSIS — Z20828 Contact with and (suspected) exposure to other viral communicable diseases: Secondary | ICD-10-CM | POA: Diagnosis present

## 2018-10-02 DIAGNOSIS — R634 Abnormal weight loss: Secondary | ICD-10-CM | POA: Diagnosis present

## 2018-10-02 DIAGNOSIS — R0603 Acute respiratory distress: Secondary | ICD-10-CM | POA: Diagnosis present

## 2018-10-02 DIAGNOSIS — J9621 Acute and chronic respiratory failure with hypoxia: Secondary | ICD-10-CM | POA: Diagnosis present

## 2018-10-02 DIAGNOSIS — Z885 Allergy status to narcotic agent status: Secondary | ICD-10-CM

## 2018-10-02 DIAGNOSIS — C7839 Secondary malignant neoplasm of other respiratory organs: Secondary | ICD-10-CM | POA: Diagnosis present

## 2018-10-02 DIAGNOSIS — Z7951 Long term (current) use of inhaled steroids: Secondary | ICD-10-CM

## 2018-10-02 DIAGNOSIS — C329 Malignant neoplasm of larynx, unspecified: Secondary | ICD-10-CM | POA: Diagnosis not present

## 2018-10-02 DIAGNOSIS — Z794 Long term (current) use of insulin: Secondary | ICD-10-CM

## 2018-10-02 LAB — CBC WITH DIFFERENTIAL/PLATELET
Abs Immature Granulocytes: 0.1 10*3/uL — ABNORMAL HIGH (ref 0.00–0.07)
Basophils Absolute: 0 10*3/uL (ref 0.0–0.1)
Basophils Relative: 0 %
Eosinophils Absolute: 0.1 10*3/uL (ref 0.0–0.5)
Eosinophils Relative: 0 %
HCT: 35.7 % — ABNORMAL LOW (ref 36.0–46.0)
Hemoglobin: 11.1 g/dL — ABNORMAL LOW (ref 12.0–15.0)
Immature Granulocytes: 1 %
Lymphocytes Relative: 2 %
Lymphs Abs: 0.3 10*3/uL — ABNORMAL LOW (ref 0.7–4.0)
MCH: 27.5 pg (ref 26.0–34.0)
MCHC: 31.1 g/dL (ref 30.0–36.0)
MCV: 88.4 fL (ref 80.0–100.0)
Monocytes Absolute: 0.5 10*3/uL (ref 0.1–1.0)
Monocytes Relative: 3 %
Neutro Abs: 17 10*3/uL — ABNORMAL HIGH (ref 1.7–7.7)
Neutrophils Relative %: 94 %
Platelets: 485 10*3/uL — ABNORMAL HIGH (ref 150–400)
RBC: 4.04 MIL/uL (ref 3.87–5.11)
RDW: 14.4 % (ref 11.5–15.5)
WBC: 18 10*3/uL — ABNORMAL HIGH (ref 4.0–10.5)
nRBC: 0 % (ref 0.0–0.2)

## 2018-10-02 LAB — COMPREHENSIVE METABOLIC PANEL
ALT: 14 U/L (ref 0–44)
AST: 16 U/L (ref 15–41)
Albumin: 3.2 g/dL — ABNORMAL LOW (ref 3.5–5.0)
Alkaline Phosphatase: 108 U/L (ref 38–126)
Anion gap: 9 (ref 5–15)
BUN: 13 mg/dL (ref 6–20)
CO2: 32 mmol/L (ref 22–32)
Calcium: 8.8 mg/dL — ABNORMAL LOW (ref 8.9–10.3)
Chloride: 86 mmol/L — ABNORMAL LOW (ref 98–111)
Creatinine, Ser: 0.45 mg/dL (ref 0.44–1.00)
GFR calc Af Amer: >60 mL/min (ref >60)
GFR calc non Af Amer: >60 mL/min (ref >60)
Glucose, Bld: 223 mg/dL — ABNORMAL HIGH (ref 70–99)
Potassium: 4.8 mmol/L (ref 3.5–5.1)
Sodium: 127 mmol/L — ABNORMAL LOW (ref 135–145)
Total Bilirubin: 0.5 mg/dL (ref 0.3–1.2)
Total Protein: 6.8 g/dL (ref 6.5–8.1)

## 2018-10-02 LAB — BLOOD GAS, VENOUS
Acid-Base Excess: 11.5 mmol/L — ABNORMAL HIGH (ref 0.0–2.0)
Bicarbonate: 39.3 mmol/L — ABNORMAL HIGH (ref 20.0–28.0)
O2 Saturation: 99.5 %
Patient temperature: 37
pCO2, Ven: 65 mmHg — ABNORMAL HIGH (ref 44.0–60.0)
pH, Ven: 7.39 (ref 7.250–7.430)
pO2, Ven: 166 mmHg — ABNORMAL HIGH (ref 32.0–45.0)

## 2018-10-02 LAB — BRAIN NATRIURETIC PEPTIDE: B Natriuretic Peptide: 262 pg/mL — ABNORMAL HIGH (ref 0.0–100.0)

## 2018-10-02 LAB — GLUCOSE, CAPILLARY: Glucose-Capillary: 232 mg/dL — ABNORMAL HIGH (ref 70–99)

## 2018-10-02 LAB — HEMOGLOBIN A1C
Hgb A1c MFr Bld: 8.8 % — ABNORMAL HIGH (ref 4.8–5.6)
Mean Plasma Glucose: 205.86 mg/dL

## 2018-10-02 LAB — TROPONIN I (HIGH SENSITIVITY): Troponin I (High Sensitivity): 11 ng/L (ref <18)

## 2018-10-02 MED ORDER — METHYLPREDNISOLONE SODIUM SUCC 125 MG IJ SOLR: 60.0000 mg | Freq: Three times a day (TID) | INTRAMUSCULAR | Status: DC

## 2018-10-02 MED ORDER — VITAMIN C 500 MG PO TABS: 250.0000 mg | ORAL_TABLET | Freq: Two times a day (BID) | ORAL | Status: DC

## 2018-10-02 MED ORDER — ORAL CARE MOUTH RINSE: 15.0000 mL | Freq: Two times a day (BID) | OROMUCOSAL | Status: DC

## 2018-10-02 MED ORDER — HALOPERIDOL LACTATE 5 MG/ML IJ SOLN
1.0000 mg | Freq: Once | INTRAMUSCULAR | Status: AC
  Administered 2018-10-02: 1 mg via INTRAVENOUS

## 2018-10-02 MED ORDER — POLYETHYLENE GLYCOL 3350 17 G PO PACK: 17.0000 g | PACK | Freq: Every day | ORAL | Status: DC

## 2018-10-02 MED ORDER — IPRATROPIUM-ALBUTEROL 0.5-2.5 (3) MG/3ML IN SOLN
3.0000 mL | Freq: Once | RESPIRATORY_TRACT | Status: AC
  Administered 2018-10-02: 3 mL via RESPIRATORY_TRACT

## 2018-10-02 MED ORDER — MORPHINE 100MG IN NS 100ML (1MG/ML) PREMIX INFUSION
1.0000 mg/h | INTRAVENOUS | Status: DC
  Administered 2018-10-02: 1 mg/h via INTRAVENOUS

## 2018-10-02 MED ORDER — VANCOMYCIN HCL IN DEXTROSE 1-5 GM/200ML-% IV SOLN: 1000.0000 mg | Freq: Once | INTRAVENOUS | Status: DC

## 2018-10-02 MED ORDER — PANTOPRAZOLE SODIUM 40 MG PO TBEC: 40.0000 mg | DELAYED_RELEASE_TABLET | Freq: Every day | ORAL | Status: DC

## 2018-10-02 MED ORDER — LIDOCAINE 5 % EX PTCH
1.0000 | MEDICATED_PATCH | CUTANEOUS | Status: DC
  Administered 2018-10-02: 1 via TRANSDERMAL
  Filled 2018-10-02: qty 1

## 2018-10-02 MED ORDER — DEXMEDETOMIDINE HCL IN NACL 400 MCG/100ML IV SOLN
0.4000 ug/kg/h | INTRAVENOUS | Status: DC
  Administered 2018-10-02: 1.2 ug/kg/h via INTRAVENOUS
  Administered 2018-10-02: 0.9 ug/kg/h via INTRAVENOUS
  Filled 2018-10-02: qty 100

## 2018-10-02 MED ORDER — MIRTAZAPINE 15 MG PO TABS: 7.5000 mg | ORAL_TABLET | Freq: Every day | ORAL | Status: DC

## 2018-10-02 MED ORDER — VANCOMYCIN HCL 1.25 G IV SOLR
1250.0000 mg | Freq: Once | INTRAVENOUS | Status: AC
  Administered 2018-10-02: 13:00:00 1250 mg via INTRAVENOUS
  Filled 2018-10-02: qty 1250

## 2018-10-02 MED ORDER — INSULIN ASPART 100 UNIT/ML ~~LOC~~ SOLN: 0.0000 [IU] | Freq: Three times a day (TID) | SUBCUTANEOUS | Status: DC

## 2018-10-02 MED ORDER — ESCITALOPRAM OXALATE 10 MG PO TABS
20.0000 mg | ORAL_TABLET | Freq: Every morning | ORAL | Status: DC
  Filled 2018-10-02: qty 2

## 2018-10-02 MED ORDER — SENNOSIDES-DOCUSATE SODIUM 8.6-50 MG PO TABS: 1.0000 | ORAL_TABLET | Freq: Every day | ORAL | Status: DC

## 2018-10-02 MED ORDER — ONDANSETRON HCL 4 MG PO TABS: 4.0000 mg | ORAL_TABLET | Freq: Four times a day (QID) | ORAL | Status: DC | PRN

## 2018-10-02 MED ORDER — SODIUM CHLORIDE 0.9 % IV SOLN
2.0000 g | Freq: Three times a day (TID) | INTRAVENOUS | Status: DC
  Administered 2018-10-02: 2 g via INTRAVENOUS
  Filled 2018-10-02 (×4): qty 2

## 2018-10-02 MED ORDER — VANCOMYCIN HCL IN DEXTROSE 1-5 GM/200ML-% IV SOLN
1000.0000 mg | INTRAVENOUS | Status: DC
  Filled 2018-10-02: qty 200

## 2018-10-02 MED ORDER — FENTANYL CITRATE (PF) 100 MCG/2ML IJ SOLN: 50.0000 ug | INTRAMUSCULAR | Status: DC | PRN

## 2018-10-02 MED ORDER — PIPERACILLIN-TAZOBACTAM 3.375 G IVPB 30 MIN
3.3750 g | Freq: Once | INTRAVENOUS | Status: AC
  Administered 2018-10-02: 3.375 g via INTRAVENOUS
  Filled 2018-10-02: qty 50

## 2018-10-02 MED ORDER — SODIUM CHLORIDE 0.9 % IV SOLN
INTRAVENOUS | Status: DC
  Administered 2018-10-02: 13:00:00 via INTRAVENOUS

## 2018-10-02 MED ORDER — ENOXAPARIN SODIUM 40 MG/0.4ML ~~LOC~~ SOLN: 40.0000 mg | SUBCUTANEOUS | Status: DC

## 2018-10-02 MED ORDER — ACETAMINOPHEN 325 MG PO TABS: 650.0000 mg | ORAL_TABLET | Freq: Four times a day (QID) | ORAL | Status: DC | PRN

## 2018-10-02 MED ORDER — VANCOMYCIN HCL 10 G IV SOLR
1250.0000 mg | Freq: Once | INTRAVENOUS | Status: DC
  Filled 2018-10-02: qty 1250

## 2018-10-02 MED ORDER — HALOPERIDOL LACTATE 5 MG/ML IJ SOLN
2.0000 mg | Freq: Four times a day (QID) | INTRAMUSCULAR | Status: DC | PRN
  Administered 2018-10-05: 10:00:00 2 mg via INTRAVENOUS
  Filled 2018-10-02: qty 1

## 2018-10-02 MED ORDER — HALOPERIDOL LACTATE 5 MG/ML IJ SOLN
INTRAMUSCULAR | Status: AC
  Administered 2018-10-02: 1 mg via INTRAVENOUS
  Filled 2018-10-02: qty 1

## 2018-10-02 MED ORDER — IPRATROPIUM-ALBUTEROL 0.5-2.5 (3) MG/3ML IN SOLN
3.0000 mL | RESPIRATORY_TRACT | Status: DC
  Administered 2018-10-02: 3 mL via RESPIRATORY_TRACT
  Filled 2018-10-02: qty 3

## 2018-10-02 MED ORDER — INSULIN ASPART 100 UNIT/ML ~~LOC~~ SOLN: 0.0000 [IU] | Freq: Every day | SUBCUTANEOUS | Status: DC

## 2018-10-02 MED ORDER — MOMETASONE FURO-FORMOTEROL FUM 200-5 MCG/ACT IN AERO
2.0000 | INHALATION_SPRAY | Freq: Two times a day (BID) | RESPIRATORY_TRACT | Status: DC
  Filled 2018-10-02: qty 8.8

## 2018-10-02 MED ORDER — FENTANYL CITRATE (PF) 100 MCG/2ML IJ SOLN
50.0000 ug | Freq: Once | INTRAMUSCULAR | Status: AC
  Administered 2018-10-02: 50 ug via INTRAVENOUS

## 2018-10-02 MED ORDER — ONDANSETRON HCL 4 MG/2ML IJ SOLN: 4.0000 mg | Freq: Four times a day (QID) | INTRAMUSCULAR | Status: DC | PRN

## 2018-10-02 MED ORDER — NICOTINE 21 MG/24HR TD PT24
21.0000 mg | MEDICATED_PATCH | Freq: Every day | TRANSDERMAL | Status: DC
  Administered 2018-10-02: 21 mg via TRANSDERMAL
  Filled 2018-10-02: qty 1

## 2018-10-02 MED ORDER — FENTANYL CITRATE (PF) 100 MCG/2ML IJ SOLN
INTRAMUSCULAR | Status: AC
  Administered 2018-10-02: 50 ug via INTRAVENOUS
  Filled 2018-10-02: qty 2

## 2018-10-02 MED ORDER — OXYCODONE HCL 5 MG PO TABS: 10.0000 mg | ORAL_TABLET | ORAL | Status: DC | PRN

## 2018-10-02 MED ORDER — CHLORHEXIDINE GLUCONATE 0.12 % MT SOLN: 15.0000 mL | Freq: Two times a day (BID) | OROMUCOSAL | Status: DC

## 2018-10-02 MED ORDER — FLUTICASONE PROPIONATE 50 MCG/ACT NA SUSP
1.0000 | Freq: Every day | NASAL | Status: DC | PRN
  Filled 2018-10-02: qty 16

## 2018-10-02 MED ORDER — LORAZEPAM 2 MG/ML IJ SOLN
0.5000 mg | Freq: Once | INTRAMUSCULAR | Status: AC
  Administered 2018-10-02: 11:00:00 0.5 mg via INTRAVENOUS
  Filled 2018-10-02: qty 1

## 2018-10-02 MED ORDER — GABAPENTIN 400 MG PO CAPS: 800.0000 mg | ORAL_CAPSULE | Freq: Three times a day (TID) | ORAL | Status: DC

## 2018-10-02 MED ORDER — INSULIN GLARGINE 100 UNIT/ML ~~LOC~~ SOLN
25.0000 [IU] | Freq: Every day | SUBCUTANEOUS | Status: DC
  Filled 2018-10-02: qty 0.25

## 2018-10-02 MED ORDER — METHYLPREDNISOLONE SODIUM SUCC 125 MG IJ SOLR
125.0000 mg | Freq: Once | INTRAMUSCULAR | Status: AC
  Administered 2018-10-02: 125 mg via INTRAVENOUS
  Filled 2018-10-02: qty 2

## 2018-10-02 MED ORDER — CHLORHEXIDINE GLUCONATE CLOTH 2 % EX PADS
6.0000 | MEDICATED_PAD | Freq: Every day | CUTANEOUS | Status: DC
  Administered 2018-10-03: 6 via TOPICAL

## 2018-10-02 NOTE — Progress Notes (Addendum)
   09/27/2018 1600  Clinical Encounter Type  Visited With Family;Health care provider  Visit Type Follow-up  Referral From Physician  Consult/Referral To Chaplain  Spiritual Encounters  Spiritual Needs Emotional;Grief support  Stress Factors  Patient Stress Factors Not reviewed  Family Stress Factors Health changes;Loss;Major life changes   Chaplain received a page for the patient's family in the wake of the patient being made Comfort Care. Upon arrival, this writer learned that the patient's eldest daughter(Brittany?) had gone to bring her younger sister Vikki Ports?) and cousin up to the patient's room. While awaiting their return, this chaplain offered a prayer for the patient and the family at the bedside.   Upon the family's return, this writer talked with them and offered support in the form of active and reflective listening, compassionate presence, and comfort in their anticipatory grief. The patient's daughters wept and talked about their mother and her feisty personality, which has recently been different than how she existed before. The patient's eldest daughter reports that she, "likes this version of her mother better" because "she's freer." This chaplain encouraged the family to talk openly with the patient and share whatever is on their hearts. This chaplain left to allow the family to spend time together and encouraged them to ask the nurse to call or page if necessary. Will follow up at a later time.

## 2018-10-02 NOTE — Progress Notes (Signed)
Pharmacy Antibiotic Note  Katie Guzman is a 48 y.o. female admitted on 10/03/2018 with pneumonia.  Pharmacy has been consulted for vancomycin and cefepime dosing.  Plan: Vancomycin 1000 mg IV Q 24 hrs. Goal AUC 400-550. Expected AUC: 443 SCr used: 0.8  Cefepime 2gm every 8 hours  Height: 5\' 2"  (157.5 cm) Weight: 114 lb 6.7 oz (51.9 kg) IBW/kg (Calculated) : 50.1  Temp (24hrs), Avg:98.8 F (37.1 C), Min:98.5 F (36.9 C), Max:99 F (37.2 C)  Recent Labs  Lab 09/24/2018 1041  WBC 18.0*  CREATININE 0.45    Estimated Creatinine Clearance: 67.3 mL/min (by C-G formula based on SCr of 0.45 mg/dL).    Allergies  Allergen Reactions  . Flexeril [Cyclobenzaprine] Hives and Nausea And Vomiting  . Hydrocodone Hives  . Other Hives  . Darvocet [Propoxyphene N-Acetaminophen] Hives and Nausea And Vomiting  . Fish-Derived Products Other (See Comments)    Patient does not like to eat fish. Not allergic but prefers not to.  . Propofol Nausea And Vomiting  . Toradol [Ketorolac Tromethamine] Nausea And Vomiting  . Vicodin [Hydrocodone-Acetaminophen] Hives and Nausea And Vomiting  . Ketorolac Nausea And Vomiting  . Tramadol Nausea And Vomiting    Vomiting only    Antimicrobials this admission: vancomycin 08/23 >>  zosyn 08/23 >> 08/23 Cefepime 08/23>>  Microbiology results: 08/23 BCx: pending 08/23 Covid: pending  08/23 MRSA PCR: pending  Thank you for allowing pharmacy to be a part of this patient's care.  Jovontae Banko 09/21/2018 1:34 PM

## 2018-10-02 NOTE — ED Triage Notes (Signed)
Pt to ER via EMS from home with severe respiratory distress.  Pt has hx of metastatic throat CA.  Pt was to begin hospice care next week, but currently remains full code.  Pt was coded and placed in  ICU last week.  Pt arrives tripoding on NRB which she is not tolerating.  Dr. Jimmye Norman and RT to bedside, pt to be placed on BiPap.

## 2018-10-02 NOTE — ED Provider Notes (Signed)
Katie Guzman Provider Note       Time seen: ----------------------------------------- 10:40 AM on 09/30/2018 ----------------------------------------- Level V caveat: History/ROS limited by acute respiratory distress  I have reviewed the triage vital signs and the nursing notes.  HISTORY   Chief Complaint Respiratory Distress    HPI Katie Guzman is a 48 y.o. female with a history of asthma, lung cancer, COPD, cellulitis, diabetes, COPD, fibromyalgia, recent acute respiratory failure who presents to the ED for acute respiratory distress.  Patient arrives from home by EMS with a history of metastatic throat cancer as previously dictated.  She was supposed to begin hospice care next week but currently remains full code.  She was recently coded and placed in the ICU last week.  She was on a nonrebreather prior to arrival.  Past Medical History:  Diagnosis Date  . Asthma   . Cancer ([place])    lung  . Cellulitis 04/15/2015  . COPD (chronic obstructive pulmonary disease) ([place])   . Diabetes mellitus    Poorly controlled with complications  . Emphysema   . Fibromyalgia   . Neuropathy   . Tenosynovitis of foot 04/16/2015    Patient Active Problem List   Diagnosis Date Noted  . Acute respiratory failure ([place]) 09/18/2018  . Toxic encephalopathy 09/09/2018  . Acute respiratory failure with hypercapnia ([place])   . Aspiration into airway   . Polysubstance abuse ([place])   . Osteomyelitis of ankle or foot, acute, right ([place])   . Cellulitis of right foot 08/18/2018  . Diabetic ulcer of right foot associated with diabetes mellitus due to underlying condition ([place]) 07/29/2018  . Diabetic foot infection ([place]) 03/27/2017  . Skin ulcer ([place]) 08/23/2016  . Diabetic foot ulcer ([place]) 08/23/2016  . Diabetic ulcer of toe of left foot associated with type 2 diabetes mellitus ([place])   . 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  Tenosynovitis of foot 04/16/2015  . Hypokalemia 04/16/2015  . Cellulitis 04/15/2015  . Diabetes mellitus ([place]) 04/15/2015  . Pain syndrome, chronic 04/15/2015  . COPD (chronic obstructive pulmonary disease) ([place]) 04/15/2015  . Diabetic neuropathy ([place]) 04/15/2015  . IDDM (insulin dependent diabetes mellitus) ([place])   . COPD exacerbation ([place]) 10/19/2014    Past Surgical History:  Procedure Laterality Date  . AMPUTATION Right 08/19/2018   Procedure: AMPUTATION RAY;  Surgeon: Sharlotte Alamo, DPM;  Location: [facility] ORS;  Service: Podiatry;  Laterality: Right;  . AMPUTATION TOE Right 08/19/2018   Procedure: AMPUTATION TOE;  Surgeon: Sharlotte Alamo, DPM;  Location: [facility] ORS;  Service: Podiatry;  Laterality: Right;  . BACK SURGERY    . CESAREAN SECTION    . TONSILLECTOMY    . TUBAL LIGATION    . WOUND DEBRIDEMENT Right 08/19/2018   Procedure: DEBRIDEMENT WOUND RIGHT FOOT, DIABETIC;  Surgeon: Sharlotte Alamo, DPM;  Location: [facility] ORS;  Service: Podiatry;  Laterality: Right;    Allergies Flexeril [cyclobenzaprine], Hydrocodone, Other, Darvocet [propoxyphene n-acetaminophen], Fish-derived products, Propofol, Toradol [ketorolac tromethamine], Vicodin [hydrocodone-acetaminophen], Ketorolac, and Tramadol  Social History Social History   Tobacco Use  . Smoking status: Current Some Day Smoker    Packs/day: 0.50    Years: 30.00    Pack years: 15.00    Types: Cigarettes    Last attempt to quit: 10/05/2014    Years since quitting: 3.9  . Smokeless tobacco: Never Used  Substance Use Topics  . Alcohol use: No  . Drug use: No   Review of Systems Unknown,  patient in severe respiratory distress on arrival  All systems negative/normal/unremarkable except as stated in the HPI  ____________________________________________   PHYSICAL EXAM:  VITAL SIGNS: ED Triage Vitals  Enc Vitals Group     BP      Pulse      Resp      Temp      Temp src      SpO2      Weight      Height      Head  Circumference      Peak Flow      Pain Score      Pain Loc      Pain Edu?      Excl. in [employer]?     Constitutional: Alert and oriented.  Chronically ill-appearing, moderate to severe distress ENT      Head: Normocephalic and atraumatic.      Nose: No congestion/rhinnorhea.      Mouth/Throat: Mucous membranes are moist.      Neck: Possible stridor is noted Cardiovascular: Normal rate, regular rhythm. No murmurs, rubs, or gallops. Respiratory: Tachypnea with mostly clear breath sounds bilaterally Gastrointestinal: Soft and nontender. Normal bowel sounds Musculoskeletal: Nontender with normal range of motion in extremities. No lower extremity tenderness nor edema. Neurologic:  Normal speech and language. No gross focal neurologic deficits are appreciated.  Skin:  Skin is warm, dry and intact.  Cyanosis is noted Psychiatric: Mood and affect are normal.  ____________________________________________  EKG: Interpreted by me.  Sinus tachycardia with a rate of 98 bpm, nonspecific ST segment changes, normal axis, normal QT.  ____________________________________________  ED COURSE:  As part of my medical decision making, I reviewed the following data within the [place] History obtained from family if available, nursing notes, old chart and ekg, as well as notes from prior ED visits. Patient presented for acute respiratory distress, we will assess with labs and imaging as indicated at this time.   Procedures  Katie Guzman was evaluated in [facility] on 10/06/2018 for the symptoms described in the history of present illness. She was evaluated in the context of the global COVID-19 pandemic, which necessitated consideration that the patient might be at risk for infection with the SARS-CoV-2 virus that causes COVID-19. Institutional protocols and algorithms that pertain to the evaluation of patients at risk for COVID-19 are in a state of rapid change based on  information released by regulatory bodies including the CDC and federal and state organizations. These policies and algorithms were followed during the patient's care in the ED.  ____________________________________________   LABS (pertinent positives/negatives)  Labs Reviewed  CBC WITH DIFFERENTIAL/PLATELET - Abnormal; Notable for the following components:      Result Value   WBC 18.0 (*)    Hemoglobin 11.1 (*)    HCT 35.7 (*)    Platelets 485 (*)    Neutro Abs 17.0 (*)    Lymphs Abs 0.3 (*)    Abs Immature Granulocytes 0.10 (*)    All other components within normal limits  BRAIN NATRIURETIC PEPTIDE - Abnormal; Notable for the following components:   B Natriuretic Peptide 262.0 (*)    All other components within normal limits  COMPREHENSIVE METABOLIC PANEL - Abnormal; Notable for the following components:   Sodium 127 (*)    Chloride 86 (*)    Glucose, Bld 223 (*)    Calcium 8.8 (*)    Albumin 3.2 (*)    All other components within normal limits  BLOOD GAS, VENOUS - Abnormal; Notable for the following components:   pCO2, Ven 65 (*)    pO2, Ven 166.0 (*)    Bicarbonate 39.3 (*)    Acid-Base Excess 11.5 (*)    All other components within normal limits  CULTURE, BLOOD (ROUTINE X 2)  CULTURE, BLOOD (ROUTINE X 2)  NOVEL CORONAVIRUS, NAA (HOSPITAL ORDER, SEND-OUT TO REF LAB)  TROPONIN I (HIGH SENSITIVITY)   CRITICAL CARE Performed by: Laurence Aly   Total critical care time: 30 minutes  Critical care time was exclusive of separately billable procedures and treating other patients.  Critical care was necessary to treat or prevent imminent or life-threatening deterioration.  Critical care was time spent personally by me on the following activities: development of treatment plan with patient and/or surrogate as well as nursing, discussions with consultants, evaluation of patient's response to treatment, examination of patient, obtaining history from patient or  surrogate, ordering and performing treatments and interventions, ordering and review of laboratory studies, ordering and review of radiographic studies, pulse oximetry and re-evaluation of patient's condition.  RADIOLOGY Images were viewed by me  Chest x-ray  IMPRESSION:  Interval development of heterogeneous opacities left mid lower lung  which may represent pneumonia in the appropriate clinical setting.   Right basilar opacities favored to represent atelectasis. Infection  not excluded.  ____________________________________________   DIFFERENTIAL DIAGNOSIS   Metastatic cancer, end-of-life care, COPD, pneumonia, pneumothorax, PE  FINAL ASSESSMENT AND PLAN  Acute respiratory distress, pneumonia   Plan: The patient had presented for acute respiratory distress and was immediately placed on BiPAP. Patient's labs do reveal leukocytosis with a white count of 18,000.  She does have some hyponatremia with a sodium of 127.  PCO2 of 65 is likely somewhat acute on chronic on her blood gas. Patient's imaging reveals heterogeneous opacities in left mid and lower lung which may be pneumonia.  Metastasis is also another possibility.  I have ordered broad-spectrum antibiotics for her and we sent blood cultures.  She remains on BiPAP for respiratory distress and appears to be stable at this time.  I will discuss with hospitalist for ICU admission.   Laurence Aly, MD    Note: This note was generated in part or whole with voice recognition software. Voice recognition is usually quite accurate but there are transcription errors that can and very often do occur. I apologize for any typographical errors that were not detected and corrected.     Earleen Newport, MD 09/21/2018 1131

## 2018-10-02 NOTE — Progress Notes (Signed)
   09/20/2018 1240  Clinical Encounter Type  Visited With Family;Health care provider  Visit Type Initial;Spiritual support  Referral From Nurse  Consult/Referral To [place] received a page from the Operator to support the oldest daughter of the patient. Upon arrival to the ED lobby, Theadora Rama (ED nurse) informed me that the patient's daughter was upset and may benefit from emotional support. The patient's daughter had gone back to her car at that time. This Probation officer was updated when she returned. The patient's daughter was very tearful and explained that her mother was not doing well. I am aware of the patient and several of the other [facility] staff had provided care to her during her most recent admission earlier this month. The patient's daughter verbalized the pain and anguish of not being able to be bedside (her youngest sister is with their mother as a support person at this time). She understands the hospital policy, but is grieved with the thought of their mother dying in the hospital alone. The patient's oldest daughter reported that the family is hoping to get the patient/her mother on hospice care soon. The chaplain provided support in the form of active and reflective listening, compassionate presence, and comfort.

## 2018-10-02 NOTE — Consult Note (Signed)
Reason for Consult: Acute hypoxic respiratory failure Referring Physician: Dr. Tressia Miners  History of present illness  Katie Guzman is an 48 y.o. 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  Supposed to be transitioned to hospice care next week but her symptoms got worse hence family brought her to the emergency room.  At the ED, patient was delirious severely agitated and in severe respiratory distress hence she was placed on BiPAP.  She remained severely agitated on BiPAP necessitating initiation of Precedex infusion.  She is now resting comfortably.  Review of patient's records indicate that over the last couple of months, she has had progressive decline and significant weight loss as well as inability to swallow.  She has had multiple hospitalizations over the last 3 months in was intubated during her last hospitalization.  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  Per patient's daughters, she has been coughing more stopped it looks like tissue and she has lost a lot of teeth. Her chest x-ray in the ED showed interval development of left middle lobe opacities and right basilar opacities suggestive of pneumonia.  Labs showed a WBC of 18.0 and the a PCO2 of 65 on VBG.   After discussion with the family, they agreed to transition patient to comfort care.  She is now off BiPAP and resting comfortably on high flow nasal cannula.  Plan is to transition her to a morphine drip and transfer her out of the ICU and subsequently either home with hospice or to a hospice house.  Past Medical History:  Diagnosis Date  . Asthma   . Cancer ([place])  2019   laryngeal cancer  . Cellulitis 04/15/2015  . COPD (chronic obstructive pulmonary disease) ([place])   . Diabetes mellitus    Poorly controlled with complications  . Emphysema   . Fibromyalgia   . Neuropathy   . Tenosynovitis of foot 04/16/2015    Past Surgical History:  Procedure Laterality Date  . AMPUTATION Right 08/19/2018   Procedure: AMPUTATION RAY;  Surgeon: Sharlotte Alamo, DPM;  Location: [facility] ORS;  Service: Podiatry;  Laterality: Right;  . AMPUTATION TOE Right 08/19/2018   Procedure: AMPUTATION TOE;  Surgeon: Sharlotte Alamo, DPM;  Location: [facility] ORS;  Service: Podiatry;  Laterality: Right;  . BACK SURGERY    . CESAREAN SECTION    . TONSILLECTOMY    . TUBAL LIGATION    . WOUND DEBRIDEMENT Right 08/19/2018   Procedure: DEBRIDEMENT WOUND RIGHT FOOT, DIABETIC;  Surgeon: Sharlotte Alamo, DPM;  Location: [facility] ORS;  Service: Podiatry;  Laterality: Right;    Family History  Problem Relation Age of Onset  . Lung cancer Mother     Social History:  reports that she has been smoking cigarettes. She has a 15.00 pack-year smoking history. She has never used smokeless tobacco. She reports that she does not drink alcohol or use drugs.  Allergies:  Allergies  Allergen Reactions  . Flexeril [Cyclobenzaprine] Hives and Nausea And Vomiting  . Hydrocodone Hives  . Other Hives  . Darvocet [Propoxyphene N-Acetaminophen] Hives and Nausea And Vomiting  . Fish-Derived Products Other (See Comments)    Patient does not like  to eat fish. Not allergic but prefers not to.  . Propofol Nausea And Vomiting  . Toradol [Ketorolac Tromethamine] Nausea And Vomiting  . Vicodin [Hydrocodone-Acetaminophen] Hives and Nausea And Vomiting  . Ketorolac Nausea And Vomiting  . Tramadol Nausea And Vomiting    Vomiting only    Medications:  No current facility-administered medications on file prior to encounter.    Current Outpatient Medications on File Prior to Encounter  Medication Sig Dispense Refill  .  acetaminophen (TYLENOL) 325 MG tablet Take 650 mg by mouth every 6 (six) hours as needed for mild pain.    Marland Kitchen albuterol (PROVENTIL) (2.5 MG/3ML) 0.083% nebulizer solution Take 3 mLs (2.5 mg total) by nebulization every 4 (four) hours as needed for wheezing or shortness of breath. 75 mL 12  . albuterol (VENTOLIN HFA) 108 (90 Base) MCG/ACT inhaler Inhale 2 puffs into the lungs every 4 (four) hours as needed for wheezing or shortness of breath. For wheeze or shortness of breath 18 g 2  . ascorbic acid (VITAMIN C) 250 MG tablet Take 1 tablet (250 mg total) by mouth 2 (two) times daily. 60 tablet 0  . escitalopram (LEXAPRO) 10 MG tablet Take 2 tablets (20 mg total) by mouth every morning. 60 tablet 5  . fluticasone (FLONASE) 50 MCG/ACT nasal spray Place 1 spray into both nostrils daily. 16 g 2  . Fluticasone-Salmeterol (ADVAIR) 250-50 MCG/DOSE AEPB Inhale 1 puff into the lungs every 12 (twelve) hours. 60 each 1  . gabapentin (NEURONTIN) 800 MG tablet Take 1 tablet (800 mg total) by mouth 3 (three) times daily. 90 tablet 1  . glycerin adult 2 g suppository Place 1 suppository rectally as needed for constipation. 12 suppository 0  . ibuprofen (ADVIL) 800 MG tablet Take 800 mg by mouth every 6 (six) hours as needed for fever or mild pain.     Marland Kitchen insulin glargine (LANTUS) 100 UNIT/ML injection Inject 0.25 mLs (25 Units total) into the skin at bedtime. 10 mL 11  . insulin lispro (HUMALOG) 100 UNIT/ML injection Inject 0.01-0.18 mLs (1-18 Units total) into the skin 4 (four) times daily -  before meals and at bedtime. Sliding scale 10 mL 11  . lidocaine (LIDODERM) 5 % Place 1 patch onto the skin daily as needed. For pain. Remove & Discard patch within 12 hours or as directed by MD    . mirtazapine (REMERON) 7.5 MG tablet Take 1 tablet (7.5 mg total) by mouth Nightly. 30 tablet 11  . nicotine (NICODERM CQ - DOSED IN MG/24 HOURS) 21 mg/24hr patch Place 1 patch (21 mg total) onto the skin daily. 28 patch 0  . oxyCODONE  (OXY IR/ROXICODONE) 5 MG immediate release tablet Take 2 tablets (10 mg total) by mouth every 4 (four) hours as needed for severe pain. 15 tablet 0  . pantoprazole (PROTONIX) 40 MG tablet Take 1 tablet (40 mg total) by mouth daily. 30 tablet 0  . polyethylene glycol (MIRALAX / GLYCOLAX) 17 g packet Take 17 g by mouth daily. Mix one tablespoon with 8oz of your favorite juice or water every day until you are having soft formed stools. 30 each 0  . senna-docusate (SENOKOT-S) 8.6-50 MG tablet Take 1 tablet by mouth daily.    Marland Kitchen tiotropium (SPIRIVA) 18 MCG inhalation capsule Place 1 capsule (18 mcg total) into inhaler and inhale daily. 30 capsule 0    Results for orders placed or performed during the hospital encounter of 10/01/2018 (from the past 48 hour(s))  CBC with  Differential     Status: Abnormal   Collection Time: 09/22/2018 10:41 AM  Result Value Ref Range   WBC 18.0 (H) 4.0 - 10.5 K/uL   RBC 4.04 3.87 - 5.11 MIL/uL   Hemoglobin 11.1 (L) 12.0 - 15.0 g/dL   HCT 35.7 (L) 36.0 - 46.0 %   MCV 88.4 80.0 - 100.0 fL   MCH 27.5 26.0 - 34.0 pg   MCHC 31.1 30.0 - 36.0 g/dL   RDW 14.4 11.5 - 15.5 %   Platelets 485 (H) 150 - 400 K/uL   nRBC 0.0 0.0 - 0.2 %   Neutrophils Relative % 94 %   Neutro Abs 17.0 (H) 1.7 - 7.7 K/uL   Lymphocytes Relative 2 %   Lymphs Abs 0.3 (L) 0.7 - 4.0 K/uL   Monocytes Relative 3 %   Monocytes Absolute 0.5 0.1 - 1.0 K/uL   Eosinophils Relative 0 %   Eosinophils Absolute 0.1 0.0 - 0.5 K/uL   Basophils Relative 0 %   Basophils Absolute 0.0 0.0 - 0.1 K/uL   Immature Granulocytes 1 %   Abs Immature Granulocytes 0.10 (H) 0.00 - 0.07 K/uL    Comment: Performed at [facility], [address] [place], [place] [address]  Brain natriuretic peptide     Status: Abnormal   Collection Time: 10/06/2018 10:41 AM  Result Value Ref Range   B Natriuretic Peptide 262.0 (H) 0.0 - 100.0 pg/mL    Comment: Performed at [facility], [address] [place], [place]  [address]  Comprehensive metabolic panel     Status: Abnormal   Collection Time: 10/05/2018 10:41 AM  Result Value Ref Range   Sodium 127 (L) 135 - 145 mmol/L   Potassium 4.8 3.5 - 5.1 mmol/L   Chloride 86 (L) 98 - 111 mmol/L   CO2 32 22 - 32 mmol/L   Glucose, Bld 223 (H) 70 - 99 mg/dL   BUN 13 6 - 20 mg/dL   Creatinine, Ser 0.45 0.44 - 1.00 mg/dL   Calcium 8.8 (L) 8.9 - 10.3 mg/dL   Total Protein 6.8 6.5 - 8.1 g/dL   Albumin 3.2 (L) 3.5 - 5.0 g/dL   AST 16 15 - 41 U/L   ALT 14 0 - 44 U/L   Alkaline Phosphatase 108 38 - 126 U/L   Total Bilirubin 0.5 0.3 - 1.2 mg/dL   GFR calc non Af Amer >60 >60 mL/min   GFR calc Af Amer >60 >60 mL/min   Anion gap 9 5 - 15    Comment: Performed at [facility], [address] [place], [place] [address]  Blood gas, venous     Status: Abnormal   Collection Time: 09/30/2018 10:41 AM  Result Value Ref Range   pH, Ven 7.39 7.250 - 7.430   pCO2, Ven 65 (H) 44.0 - 60.0 mmHg   pO2, Ven 166.0 (H) 32.0 - 45.0 mmHg   Bicarbonate 39.3 (H) 20.0 - 28.0 mmol/L   Acid-Base Excess 11.5 (H) 0.0 - 2.0 mmol/L   O2 Saturation 99.5 %   Patient temperature 37.0    Collection site VEIN    Sample type VEIN     Comment: Performed at [facility], [place], [place] [address]  Troponin I (High Sensitivity)     Status: None   Collection Time: 09/14/2018 10:41 AM  Result Value Ref Range   Troponin I (High Sensitivity) 11 <18 ng/L    Comment: (NOTE) Elevated high sensitivity  troponin I (hsTnI) values and significant  changes across serial measurements may suggest ACS but many other  chronic and acute conditions are known to elevate hsTnI results.  Refer to the "Links" section for chest pain algorithms and additional  guidance. Performed at [facility], [address] [place], [place] [address]   Glucose, capillary     Status: Abnormal   Collection Time: 09/17/2018  1:03 PM  Result Value Ref Range   Glucose-Capillary 232 (H) 70  - 99 mg/dL    Dg Chest Port 1 View  Result Date: 09/30/2018 CLINICAL DATA:  Patient with respiratory distress. EXAM: PORTABLE CHEST 1 VIEW COMPARISON:  Chest radiograph September 18, 2018 FINDINGS: Monitoring leads overlie the patient. Stable enlarged cardiac and mediastinal contours. Interval development of patchy consolidation left mid and lower lung. Persistent heterogeneous opacities right lung base. No pleural effusion or pneumothorax. IMPRESSION: Interval development of heterogeneous opacities left mid lower lung which may represent pneumonia in the appropriate clinical setting. Right basilar opacities favored to represent atelectasis. Infection not excluded. Electronically Signed   By: Lovey Newcomer M.D.   On: 09/16/2018 11:22    ROS: Unable to obtain as patient is currently sleeping Blood pressure 139/70, pulse 89, temperature 99 F (37.2 C), temperature source Axillary, resp. rate 20, height 5\' 2"  (1.575 m), weight 51.9 kg, SpO2 93 %. Physical Exam  Constitutional: She appears lethargic. She appears cachectic. She is sleeping. No distress.  HENT:  Head: Normocephalic and atraumatic.  Eyes: Pupils are equal, round, and reactive to light. Conjunctivae are normal.  Neck: Normal range of motion. Neck supple.  Cardiovascular: Regular rhythm, normal heart sounds, intact distal pulses and normal pulses.  Respiratory: Effort normal. She has decreased breath sounds in the right upper field and the left middle field.  GI: Soft. Normal appearance and bowel sounds are normal. There is no abdominal tenderness.  Musculoskeletal: Normal range of motion.        General: Tenderness present.     Comments: Right foot osteomyelitis with dressing intact  Lymphadenopathy:    She has no cervical adenopathy.  Neurological: She appears lethargic.  Skin: Skin is warm, dry and intact.  Psychiatric: Cognition and memory are impaired.    Assessment Advanced squamous cell carcinoma of the supraglottic/laryngeal  nodes in 2019 Odynophagia Acute on chronic hypoxic respiratory failure Acute on chronic COPD exacerbation Adult failure to thrive Right foot osteomyelitis Chronic pain  Plan Dr. Patsey Berthold spoke at length with patient's daughters and Aunt at bedside.  After review of patient's current health status, recommendations from radiation oncology and laryngoscopy findings from 09/27/2018 visit, they agreed that they did not want patient to suffer anymore and would like to transition her to full comfort care. End-of-life care orders initiated: Morphine infusion, palliative care consult for home hospice and lorazepam as needed for anxiety Spiritual care consulted and currently at bedside Titrate off Precedex and maximize morphine infusion for comfort Aspiration precautions  Best Practice: Code Status: DNR/DNI Diet: Regular diet and oral intake as tolerated for comfort GI prophylaxis: Not indicated VTE prophylaxis: Not indicated as patient is on comfort care  Courtland Reas S. Tukov-Yual ANP-BC [facility] [place] Pager [phone] or [phone]  NB: This document was prepared using Dragon voice recognition software and may include unintentional dictation errors.    Vernard Gambles 09/22/2018, 4:43 PM

## 2018-10-02 NOTE — ED Notes (Signed)
Pt continues to tripod intermittently.  Tolerating BiPap better, but not completely.  Will continue to monitor closely.

## 2018-10-02 NOTE — Progress Notes (Signed)
The patient's history has been reviewed.  Patient cannot provide history today because of delirium and severe respiratory distress.  This is an unfortunate 47 year old woman with head and neck cancer (supraglottic/laryngeal carcinoma), status post chemoradiation and persistent issues with severe odynophagia and chronic opioid dependency.  The patient also has a history of COPD, end-stage with chronic hypoxic respiratory failure requiring 3 L of oxygen continuously.  She was diagnosed with her cancer in 2019 and has had progressive decline since then.  She has had severe weight loss due to inability to eat due to odynophagia.  She has had 3 ICU/hospital admissions since June of this year.  On her most recent admission she required short-term intubation.  Since intubation she has had more issues with odynophagia, some stridor and increasing shortness of breath.  She was evaluated by radiation oncology at [facility] where she follows for cancer, on 18 August.  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 Per the daughters the patient coughs "tissue" out.  Patient also has had issues with loss of teeth.   On presentation today she is delirious, agitated, in severe respiratory distress obviously uncomfortable on BiPAP.  I discussed the case with the patient's daughters.  Recently the patient's home health nurses had recommended hospice.  It appears that this was also the recommendation of [facility] oncology. Further discussion with patient's daughters, note that their wishes for their mother is for comfort care with a goal of hopefully returning home with hospice if possible.  I did discuss with them that intubation would not be feasible in this situation given the necrotic issues in her upper airway and further  damage that may occur from such an intervention.  Patient's daughters are in agreement with this assessment.  Patient is therefore DNR/DNI.  She has been transitioned to comfort care.  Will switch to high flow O2 as she appears very uncomfortable on BiPAP.  I did update Dr. Tressia Miners about this change in the patient's CODE STATUS.  Please see orders for details.

## 2018-10-02 NOTE — ED Notes (Signed)
ED TO INPATIENT HANDOFF REPORT  ED Nurse Name and Phone #: Anderson Malta [phone]  S Name/Age/Gender Katie Guzman 48 y.o. female Room/Bed: ED06A/ED06A  Code Status   Code Status: Prior  Home/SNF/Other Home Patient oriented to: self,  Is this baseline? Yes   Triage Complete: Triage complete  Chief Complaint Respiratory Distress  Triage Note Pt to ER via EMS from home with severe respiratory distress.  Pt has hx of metastatic throat CA.  Pt was to begin hospice care next week, but currently remains full code.  Pt was coded and placed in  ICU last week.  Pt arrives tripoding on NRB which she is not tolerating.  Dr. Jimmye Norman and RT to bedside, pt to be placed on BiPap.   Allergies Allergies  Allergen Reactions  . Flexeril [Cyclobenzaprine] Hives and Nausea And Vomiting  . Hydrocodone Hives  . Other Hives  . Darvocet [Propoxyphene N-Acetaminophen] Hives and Nausea And Vomiting  . Fish-Derived Products Other (See Comments)    Patient does not like to eat fish. Not allergic but prefers not to.  . Propofol Nausea And Vomiting  . Toradol [Ketorolac Tromethamine] Nausea And Vomiting  . Vicodin [Hydrocodone-Acetaminophen] Hives and Nausea And Vomiting  . Ketorolac Nausea And Vomiting  . Tramadol Nausea And Vomiting    Vomiting only    Level of Care/Admitting Diagnosis ED Disposition    ED Disposition Condition [facility] Area: [place] [[id number]]  Level of Care: Stepdown [14]  Covid Evaluation: N/A  Diagnosis: Acute respiratory failure ([place]) [518.81.ICD-9-CM]  Admitting Physician: Gladstone Lighter [id number]  Attending Physician: Gladstone Lighter [id number]  Estimated length of stay: past midnight tomorrow  Certification:: I certify this patient will need inpatient services for at least 2 midnights  PT Class (Do Not Modify): Inpatient [101]  PT Acc Code (Do Not Modify): Private [1]       B Medical/Surgery History Past  Medical History:  Diagnosis Date  . Asthma   . Cancer ([place])    lung  . Cellulitis 04/15/2015  . COPD (chronic obstructive pulmonary disease) ([place])   . Diabetes mellitus    Poorly controlled with complications  . Emphysema   . Fibromyalgia   . Neuropathy   . Tenosynovitis of foot 04/16/2015   Past Surgical History:  Procedure Laterality Date  . AMPUTATION Right 08/19/2018   Procedure: AMPUTATION RAY;  Surgeon: Sharlotte Alamo, DPM;  Location: [facility] ORS;  Service: Podiatry;  Laterality: Right;  . AMPUTATION TOE Right 08/19/2018   Procedure: AMPUTATION TOE;  Surgeon: Sharlotte Alamo, DPM;  Location: [facility] ORS;  Service: Podiatry;  Laterality: Right;  . BACK SURGERY    . CESAREAN SECTION    . TONSILLECTOMY    . TUBAL LIGATION    . WOUND DEBRIDEMENT Right 08/19/2018   Procedure: DEBRIDEMENT WOUND RIGHT FOOT, DIABETIC;  Surgeon: Sharlotte Alamo, DPM;  Location: [facility] ORS;  Service: Podiatry;  Laterality: Right;     A IV Location/Drains/Wounds Patient Lines/Drains/Airways Status   Active Line/Drains/Airways    Name:   Placement date:   Placement time:   Site:   Days:   Peripheral IV 09/16/2018 Left Forearm   10/09/2018    1058    Forearm   less than 1   Peripheral IV 09/22/2018 Right Forearm   09/27/2018    1059    Forearm   less than 1   Incision (Closed) 09/18/18 Toe (Comment  which one) Left   09/18/18    1000  14          Intake/Output Last 24 hours No intake or output data in the 24 hours ending 09/17/2018 1218  Labs/Imaging Results for orders placed or performed during the hospital encounter of 09/26/2018 (from the past 48 hour(s))  CBC with Differential     Status: Abnormal   Collection Time: 09/28/2018 10:41 AM  Result Value Ref Range   WBC 18.0 (H) 4.0 - 10.5 K/uL   RBC 4.04 3.87 - 5.11 MIL/uL   Hemoglobin 11.1 (L) 12.0 - 15.0 g/dL   HCT 35.7 (L) 36.0 - 46.0 %   MCV 88.4 80.0 - 100.0 fL   MCH 27.5 26.0 - 34.0 pg   MCHC 31.1 30.0 - 36.0 g/dL   RDW 14.4 11.5 - 15.5 %   Platelets 485 (H) 150 -  400 K/uL   nRBC 0.0 0.0 - 0.2 %   Neutrophils Relative % 94 %   Neutro Abs 17.0 (H) 1.7 - 7.7 K/uL   Lymphocytes Relative 2 %   Lymphs Abs 0.3 (L) 0.7 - 4.0 K/uL   Monocytes Relative 3 %   Monocytes Absolute 0.5 0.1 - 1.0 K/uL   Eosinophils Relative 0 %   Eosinophils Absolute 0.1 0.0 - 0.5 K/uL   Basophils Relative 0 %   Basophils Absolute 0.0 0.0 - 0.1 K/uL   Immature Granulocytes 1 %   Abs Immature Granulocytes 0.10 (H) 0.00 - 0.07 K/uL    Comment: Performed at [facility], [address] [place], [place] [address]  Brain natriuretic peptide     Status: Abnormal   Collection Time: 09/22/2018 10:41 AM  Result Value Ref Range   B Natriuretic Peptide 262.0 (H) 0.0 - 100.0 pg/mL    Comment: Performed at [facility], [address] [place], [place] [address]  Comprehensive metabolic panel     Status: Abnormal   Collection Time: 09/24/2018 10:41 AM  Result Value Ref Range   Sodium 127 (L) 135 - 145 mmol/L   Potassium 4.8 3.5 - 5.1 mmol/L   Chloride 86 (L) 98 - 111 mmol/L   CO2 32 22 - 32 mmol/L   Glucose, Bld 223 (H) 70 - 99 mg/dL   BUN 13 6 - 20 mg/dL   Creatinine, Ser 0.45 0.44 - 1.00 mg/dL   Calcium 8.8 (L) 8.9 - 10.3 mg/dL   Total Protein 6.8 6.5 - 8.1 g/dL   Albumin 3.2 (L) 3.5 - 5.0 g/dL   AST 16 15 - 41 U/L   ALT 14 0 - 44 U/L   Alkaline Phosphatase 108 38 - 126 U/L   Total Bilirubin 0.5 0.3 - 1.2 mg/dL   GFR calc non Af Amer >60 >60 mL/min   GFR calc Af Amer >60 >60 mL/min   Anion gap 9 5 - 15    Comment: Performed at [facility], [address] [place], [place] [address]  Blood gas, venous     Status: Abnormal   Collection Time: 09/17/2018 10:41 AM  Result Value Ref Range   pH, Ven 7.39 7.250 - 7.430   pCO2, Ven 65 (H) 44.0 - 60.0 mmHg   pO2, Ven 166.0 (H) 32.0 - 45.0 mmHg   Bicarbonate 39.3 (H) 20.0 - 28.0 mmol/L   Acid-Base Excess 11.5 (H) 0.0 - 2.0 mmol/L   O2 Saturation 99.5 %   Patient temperature 37.0    Collection site VEIN     Sample type VEIN     Comment: Performed at [facility], [place], [place] [address]  Troponin I (High Sensitivity)     Status: None   Collection Time: 09/20/2018 10:41 AM  Result Value Ref Range   Troponin I (High Sensitivity) 11 <18 ng/L    Comment: (NOTE) Elevated high sensitivity troponin I (hsTnI) values and significant  changes across serial measurements may suggest ACS but many other  chronic and acute conditions are known to elevate hsTnI results.  Refer to the "Links" section for chest pain algorithms and additional  guidance. Performed at [facility], [address] [place], [place] [address]    Dg Chest Port 1 View  Result Date: 09/21/2018 CLINICAL DATA:  Patient with respiratory distress. EXAM: PORTABLE CHEST 1 VIEW COMPARISON:  Chest radiograph September 18, 2018 FINDINGS: Monitoring leads overlie the patient. Stable enlarged cardiac and mediastinal contours. Interval development of patchy consolidation left mid and lower lung. Persistent heterogeneous opacities right lung base. No pleural effusion or pneumothorax. IMPRESSION: Interval development of heterogeneous opacities left mid lower lung which may represent pneumonia in the appropriate clinical setting. Right basilar opacities favored to represent atelectasis. Infection not excluded. Electronically Signed   By: Lovey Newcomer M.D.   On: 10/01/2018 11:22    Pending Labs Unresulted Labs (From admission, onward)    Start     Ordered   10/05/2018 1047  Blood culture (routine x 2)  BLOOD CULTURE X 2,   STAT     10/01/2018 1046   09/28/2018 1047  Novel Coronavirus, NAA (send-out to ref lab)  (Asymptomatic/Tier 2 Patients Labs)  Once,   STAT    Question Answer Comment  Is this test for diagnosis or screening Screening   Symptomatic for COVID-19 as defined by CDC Yes   Date of Symptom Onset 09/26/2018   Hospitalized for COVID-19 Yes   Admitted to ICU for COVID-19 No   Previously tested for COVID-19  No   Resident in a congregate (group) care setting No   Employed in healthcare setting No   Pregnant No      09/19/2018 1047   Signed and Held  Basic metabolic panel  Tomorrow morning,   R     Signed and Held   Signed and Held  CBC  Tomorrow morning,   R     Signed and Held   Signed and Held  MRSA PCR Screening  Once,   R     Signed and Held   Signed and Held  Hemoglobin A1c  Add-on,   R     Signed and Held          Vitals/Pain Today's Vitals   09/15/2018 1115 09/26/2018 1132 10/01/2018 1145 09/25/2018 1200  BP: (!) 119/101 (!) 128/96 (!) 149/84 (!) 143/87  Pulse: (!) 103 (!) 104 (!) 102 98  Resp: (!) 22 (!) 21 (!) 24 18  Temp:      TempSrc:      SpO2: 92% 94% 95% 94%  Weight:        Isolation Precautions No active isolations  Medications Medications  piperacillin-tazobactam (ZOSYN) IVPB 3.375 g (has no administration in time range)  vancomycin (VANCOCIN) 1,250 mg in sodium chloride 0.9 % 250 mL IVPB (has no administration in time range)  ceFEPIme (MAXIPIME) 2 g in sodium chloride 0.9 % 100 mL IVPB (has no administration in time range)  methylPREDNISolone sodium succinate (SOLU-MEDROL) 125 mg/2 mL injection 125 mg (125 mg Intravenous Given 09/21/2018 1052)  ipratropium-albuterol (DUONEB) 0.5-2.5 (3) MG/3ML nebulizer solution  3 mL (3 mLs Nebulization Given 10/06/2018 1053)  ipratropium-albuterol (DUONEB) 0.5-2.5 (3) MG/3ML nebulizer solution 3 mL (3 mLs Nebulization Given 10/06/2018 1053)  LORazepam (ATIVAN) injection 0.5 mg (0.5 mg Intravenous Given 10/09/2018 1055)    Mobility non-ambulatory High fall risk   Focused Assessments Pulmonary Assessment Handoff:  Lung sounds: Bilateral Breath Sounds: Diminished L Breath Sounds: Diminished R Breath Sounds: Diminished O2 Device: Bi-PAP        R Recommendations: See Admitting Provider Note  Report given to:   Additional Notes:

## 2018-10-02 NOTE — H&P (Addendum)
[facility] [place] [facility] [place] NAME: Katie Guzman    MR#:  [id number]  DATE OF BIRTH:  1970-02-10  DATE OF ADMISSION:  10/09/2018  PRIMARY CARE PHYSICIAN: [facility]   REQUESTING/REFERRING PHYSICIAN: Dr. Lenise Arena  CHIEF COMPLAINT:   Chief Complaint  Patient presents with  . Respiratory Distress    HISTORY OF PRESENT ILLNESS:  Katie Guzman  is a 48 y.o. 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 Patient is currently on BiPAP and unable to provide any history.  Most of the history is obtained from the records and also speaking with the patient's daughter Gari Crown over the phone.  Patient has had 2 prior hospitalizations both requiring ICU stay within the last 3 weeks.  She has COPD on 3 L home oxygen, continues to smoke.  Also diagnosed with laryngeal carcinoma in 2019 finished radiation.  Has been having odynophagia and has lost quite a bit of weight.  Since her most recent discharge patient continues to have shortness of breath intermittently.  Also seen her radiation oncologist 3 days prior to this admission.  She was in the process of starting hospice at home next week.  EMS was called because patient was having significant trouble breathing and so was brought to the emergency room.  Also complained of chest tightness.  No fevers or chills.  No nausea or vomiting. In the ED patient was placed on BiPAP for tachypnea and hypoxia.  He is being moved to ICU.  Chest x-ray also showing new left-sided infiltrates.  PAST MEDICAL HISTORY:   Past Medical History:  Diagnosis Date  . Asthma   . Cancer ([place]) 2019   laryngeal cancer  . Cellulitis 04/15/2015  . COPD (chronic obstructive  pulmonary disease) ([place])   . Diabetes mellitus    Poorly controlled with complications  . Emphysema   . Fibromyalgia   . Neuropathy   . Tenosynovitis of foot 04/16/2015    PAST SURGICAL HISTORY:   Past Surgical History:  Procedure Laterality Date  . AMPUTATION Right 08/19/2018   Procedure: AMPUTATION RAY;  Surgeon: Sharlotte Alamo, DPM;  Location: [facility] ORS;  Service: Podiatry;  Laterality: Right;  . AMPUTATION TOE Right 08/19/2018   Procedure: AMPUTATION TOE;  Surgeon: Sharlotte Alamo, DPM;  Location: [facility] ORS;  Service: Podiatry;  Laterality: Right;  . BACK SURGERY    . CESAREAN SECTION    . TONSILLECTOMY    . TUBAL LIGATION    . WOUND DEBRIDEMENT Right 08/19/2018   Procedure: DEBRIDEMENT WOUND RIGHT FOOT, DIABETIC;  Surgeon: Sharlotte Alamo, DPM;  Location: [facility] ORS;  Service: Podiatry;  Laterality: Right;    SOCIAL HISTORY:   Social History   Tobacco Use  . Smoking status: Current Some Day Smoker    Packs/day: 0.50    Years: 30.00    Pack years: 15.00    Types: Cigarettes    Last attempt to quit: 10/05/2014    Years since quitting: 3.9  . Smokeless tobacco: Never Used  Substance Use Topics  . Alcohol use: No    FAMILY HISTORY:   Family History  Problem Relation Age of Onset  . Lung cancer Mother     DRUG ALLERGIES:   Allergies  Allergen Reactions  . Flexeril [Cyclobenzaprine] Hives and Nausea And Vomiting  .  Hydrocodone Hives  . Other Hives  . Darvocet [Propoxyphene N-Acetaminophen] Hives and Nausea And Vomiting  . Fish-Derived Products Other (See Comments)    Patient does not like to eat fish. Not allergic but prefers not to.  . Propofol Nausea And Vomiting  . Toradol [Ketorolac Tromethamine] Nausea And Vomiting  . Vicodin [Hydrocodone-Acetaminophen] Hives and Nausea And Vomiting  . Ketorolac Nausea And Vomiting  . Tramadol Nausea And Vomiting    Vomiting only    REVIEW OF SYSTEMS:   Review of Systems  Unable to perform ROS: Critical illness    MEDICATIONS  AT HOME:   Prior to Admission medications   Medication Sig Start Date End Date Taking? Authorizing Provider  acetaminophen (TYLENOL) 325 MG tablet Take 650 mg by mouth every 6 (six) hours as needed for mild pain.    [provider]  albuterol (PROVENTIL) (2.5 MG/3ML) 0.083% nebulizer solution Take 3 mLs (2.5 mg total) by nebulization every 4 (four) hours as needed for wheezing or shortness of breath. 09/22/18   Dustin Flock, MD  albuterol (VENTOLIN HFA) 108 (90 Base) MCG/ACT inhaler Inhale 2 puffs into the lungs every 4 (four) hours as needed for wheezing or shortness of breath. For wheeze or shortness of breath 09/22/18 10/22/18  Dustin Flock, MD  ascorbic acid (VITAMIN C) 250 MG tablet Take 1 tablet (250 mg total) by mouth 2 (two) times daily. 07/31/18   Vaughan Basta, MD  dexamethasone (DECADRON) 4 MG tablet Take 1 tablet (4 mg total) by mouth daily for 10 days. 09/22/18 09/18/2018  Dustin Flock, MD  escitalopram (LEXAPRO) 10 MG tablet Take 2 tablets (20 mg total) by mouth every morning. 09/22/18 03/21/19  Dustin Flock, MD  fluticasone (FLONASE) 50 MCG/ACT nasal spray Place 1 spray into both nostrils daily. 09/22/18   Dustin Flock, MD  Fluticasone-Salmeterol (ADVAIR) 250-50 MCG/DOSE AEPB Inhale 1 puff into the lungs every 12 (twelve) hours. 09/22/18   Dustin Flock, MD  gabapentin (NEURONTIN) 800 MG tablet Take 1 tablet (800 mg total) by mouth 3 (three) times daily. 09/22/18   Dustin Flock, MD  glycerin adult 2 g suppository Place 1 suppository rectally as needed for constipation. 09/04/18   Merlyn Lot, MD  ibuprofen (ADVIL) 800 MG tablet Take 800 mg by mouth every 6 (six) hours as needed for fever or mild pain.     [provider]  insulin glargine (LANTUS) 100 UNIT/ML injection Inject 0.25 mLs (25 Units total) into the skin at bedtime. 09/22/18   Dustin Flock, MD  insulin lispro (HUMALOG) 100 UNIT/ML injection Inject 0.01-0.18 mLs (1-18 Units total) into  the skin 4 (four) times daily -  before meals and at bedtime. Sliding scale 09/22/18 11/04/20  Dustin Flock, MD  lidocaine (LIDODERM) 5 % Place 1 patch onto the skin daily as needed. For pain. Remove & Discard patch within 12 hours or as directed by MD    [provider]  mirtazapine (REMERON) 7.5 MG tablet Take 1 tablet (7.5 mg total) by mouth Nightly. 09/22/18 09/22/19  Dustin Flock, MD  nicotine (NICODERM CQ - DOSED IN MG/24 HOURS) 21 mg/24hr patch Place 1 patch (21 mg total) onto the skin daily. 09/22/18   Dustin Flock, MD  oxyCODONE (OXY IR/ROXICODONE) 5 MG immediate release tablet Take 2 tablets (10 mg total) by mouth every 4 (four) hours as needed for severe pain. 09/22/18   Dustin Flock, MD  pantoprazole (PROTONIX) 40 MG tablet Take 1 tablet (40 mg total) by mouth daily. 09/23/18  Dustin Flock, MD  polyethylene glycol (MIRALAX / GLYCOLAX) 17 g packet Take 17 g by mouth daily. Mix one tablespoon with 8oz of your favorite juice or water every day until you are having soft formed stools. 09/04/18   Merlyn Lot, MD  senna-docusate (SENOKOT-S) 8.6-50 MG tablet Take 1 tablet by mouth daily. 04/13/18 04/13/19  [provider]  tiotropium (SPIRIVA) 18 MCG inhalation capsule Place 1 capsule (18 mcg total) into inhaler and inhale daily. 09/22/18 10/22/18  Dustin Flock, MD      VITAL SIGNS:  Blood pressure 139/68, pulse 100, temperature 99 F (37.2 C), temperature source Axillary, resp. rate 18, height 5\' 2"  (1.575 m), weight 51.9 kg, SpO2 92 %.  PHYSICAL EXAMINATION:  Physical Exam  GENERAL:  48 y.o.-year-old patient lying in the bed, on BiPAP, critically ill-appearing EYES: Pupils equal, round, reactive to light and accommodation. No scleral icterus. Extraocular muscles intact.  HEENT: Head atraumatic, normocephalic. Oropharynx and nasopharynx clear.  NECK:  Supple, no jugular venous distention. No thyroid enlargement, no tenderness.  LUNGS: Coarse, rhonchorous  breath sounds bilaterally, decreased at the bases.  No wheezing, rales  or crepitation. No use of accessory muscles of respiration.  CARDIOVASCULAR: S1, S2 normal. No murmurs, rubs, or gallops.  ABDOMEN: Soft, nontender, nondistended. Bowel sounds present. No organomegaly or mass.  EXTREMITIES: No pedal edema, cyanosis, or clubbing.  NEUROLOGIC: Patient is sedated, not following any commands at this time.  Remains on BiPAP.  No facial droop identified.  Able to move all extremities in bed.  Sensation seems to be intact.  Withdrawing to touch..  PSYCHIATRIC: The patient is sedated SKIN: No obvious rash, lesion, or ulcer.   LABORATORY PANEL:   CBC Recent Labs  Lab 09/13/2018 1041  WBC 18.0*  HGB 11.1*  HCT 35.7*  PLT 485*   ------------------------------------------------------------------------------------------------------------------  Chemistries  Recent Labs  Lab 09/10/2018 1041  NA 127*  K 4.8  CL 86*  CO2 32  GLUCOSE 223*  BUN 13  CREATININE 0.45  CALCIUM 8.8*  AST 16  ALT 14  ALKPHOS 108  BILITOT 0.5   ------------------------------------------------------------------------------------------------------------------  Cardiac Enzymes No results for input(s): TROPONINI in the last 168 hours. ------------------------------------------------------------------------------------------------------------------  RADIOLOGY:  Dg Chest Port 1 View  Result Date: 10/03/2018 CLINICAL DATA:  Patient with respiratory distress. EXAM: PORTABLE CHEST 1 VIEW COMPARISON:  Chest radiograph September 18, 2018 FINDINGS: Monitoring leads overlie the patient. Stable enlarged cardiac and mediastinal contours. Interval development of patchy consolidation left mid and lower lung. Persistent heterogeneous opacities right lung base. No pleural effusion or pneumothorax. IMPRESSION: Interval development of heterogeneous opacities left mid lower lung which may represent pneumonia in the appropriate clinical  setting. Right basilar opacities favored to represent atelectasis. Infection not excluded. Electronically Signed   By: Lovey Newcomer M.D.   On: 09/23/2018 11:22      IMPRESSION AND PLAN:   Anelie Bowdish  is a 48 y.o. 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  1.  Acute chronic hypoxic respiratory failure-secondary to COPD exacerbation and H CAP -Patient has had 2 ICU hospitalizations within the last 3 weeks.  1 of these hospitalizations, she was even intubated. -COVID-19 is pending, but COVID-19 test x3 within the last 3 weeks has been negative. -Chest x-ray showing heterogeneous opacities in left lower lung significant for  pneumonia -Started on treatment for HCAP-vancomycin and cefepime -Blood cultures -IV steroids, duo nebs -Continue on BiPAP.  Pulmonary/ intensivist has been notified  2.  Chronic pain-continue oxycodone.  Patient follows with pain management clinic.  No narcotic prescriptions at discharge.  Also on lidocaine patch  3.  Diabetes mellitus-on Lantus and sliding scale insulin.  Monitor while on steroids.  Check A1c  4. Hyponatremia- hypovolemia vs SIADH from squamous cell carc - CXR with mild pulm edema, BNP elevated, so hold fluids and monitor  5.  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  Patient finished radiation in December 2019.  Has some odynophagia issues. -Also recently transitioned to Suboxone for chronic pain -Commended hyperbaric oxygen treatments to promote tissue healing.  Chemotherapy with patient was dealing with right foot osteomyelitis and had recent surgery done for the right leg. -Seen by palliative care during last  admission.  Patient was supposed to start hospice services at home. -However when she has seen her radiation oncologist on 09/27/2018-she mentioned that she is not sure if she is ready for that. -Given overall poor prognosis and recurrent hospitalizations-reconsult palliative care  6. DVT prophylaxis- lovenox  Discussed at length with patient's daughter Vallery Kosanke over the phone.  They understand the poor prognosis and daughter agreed with palliative care consult.   All the records are reviewed and case discussed with ED provider. Management plans discussed with the patient, family and they are in agreement.  CODE STATUS: Full Code  TOTAL CRITICAL CARE TIME SPENT IN  TAKING CARE OF THIS PATIENT: 58 minutes.    Gladstone Lighter M.D on 10/09/2018 at 1:44 PM  Between 7am to 6pm - Pager - [phone]  After 6pm go to [email or web address] - Proofreader  [facility]  Office  [phone]  CC: Primary care physician; [facility]   Note: This dictation was prepared with Dragon dictation along with smaller phrase technology. Any transcriptional errors that result from this process are unintentional.

## 2018-10-03 DIAGNOSIS — Z66 Do not resuscitate: Secondary | ICD-10-CM

## 2018-10-03 DIAGNOSIS — Z515 Encounter for palliative care: Secondary | ICD-10-CM

## 2018-10-03 DIAGNOSIS — J189 Pneumonia, unspecified organism: Principal | ICD-10-CM

## 2018-10-03 DIAGNOSIS — Z7189 Other specified counseling: Secondary | ICD-10-CM

## 2018-10-03 MED ORDER — VANCOMYCIN HCL IN DEXTROSE 1-5 GM/200ML-% IV SOLN
1000.0000 mg | INTRAVENOUS | Status: DC
  Filled 2018-10-03: qty 200

## 2018-10-03 MED ORDER — NICOTINE 21 MG/24HR TD PT24
21.0000 mg | MEDICATED_PATCH | Freq: Every day | TRANSDERMAL | Status: DC
  Administered 2018-10-03 – 2018-10-10 (×8): 21 mg via TRANSDERMAL
  Filled 2018-10-03 (×7): qty 1

## 2018-10-03 MED ORDER — MORPHINE BOLUS VIA INFUSION
2.0000 mg | Freq: Once | INTRAVENOUS | Status: AC
  Administered 2018-10-03: 2 mg via INTRAVENOUS
  Filled 2018-10-03: qty 2

## 2018-10-03 MED ORDER — LORAZEPAM 2 MG/ML IJ SOLN
1.0000 mg | INTRAMUSCULAR | Status: DC | PRN
  Administered 2018-10-04 – 2018-10-05 (×2): 1 mg via INTRAVENOUS
  Filled 2018-10-03 (×3): qty 1

## 2018-10-03 MED ORDER — FENTANYL 25 MCG/HR TD PT72
1.0000 | MEDICATED_PATCH | TRANSDERMAL | Status: DC
  Administered 2018-10-03 – 2018-10-09 (×3): 1 via TRANSDERMAL
  Filled 2018-10-03 (×3): qty 1

## 2018-10-03 MED ORDER — MORPHINE SULFATE (PF) 2 MG/ML IV SOLN
1.0000 mg | INTRAVENOUS | Status: DC | PRN
  Administered 2018-10-03 – 2018-10-05 (×11): 1 mg via INTRAVENOUS
  Filled 2018-10-03 (×11): qty 1

## 2018-10-03 MED ORDER — SODIUM CHLORIDE 0.9 % IV SOLN
2.0000 g | Freq: Three times a day (TID) | INTRAVENOUS | Status: DC
  Administered 2018-10-03 – 2018-10-05 (×5): 2 g via INTRAVENOUS
  Filled 2018-10-03 (×8): qty 2

## 2018-10-03 MED ORDER — OXYCODONE HCL 5 MG PO TABS
10.0000 mg | ORAL_TABLET | ORAL | Status: DC | PRN
  Administered 2018-10-03 – 2018-10-04 (×5): 10 mg via ORAL
  Filled 2018-10-03 (×6): qty 2

## 2018-10-03 MED ORDER — GLYCOPYRROLATE 0.2 MG/ML IJ SOLN
0.4000 mg | INTRAMUSCULAR | Status: DC | PRN
  Administered 2018-10-04 – 2018-10-05 (×3): 0.4 mg via INTRAVENOUS
  Filled 2018-10-03 (×3): qty 2

## 2018-10-03 MED ORDER — VANCOMYCIN HCL 1.25 G IV SOLR
1250.0000 mg | Freq: Once | INTRAVENOUS | Status: AC
  Administered 2018-10-03: 22:00:00 1250 mg via INTRAVENOUS
  Filled 2018-10-03: qty 1250

## 2018-10-03 NOTE — Progress Notes (Signed)
   10/01/2018 2330  Clinical Encounter Type  Visited With Family;Health care provider  Visit Type Follow-up  Referral From Nurse;Physician  Consult/Referral To Chaplain  Spiritual Encounters  Spiritual Needs Emotional;Grief support  Stress Factors  Patient Stress Factors Not reviewed  Family Stress Factors Health changes;Loss   Chaplain received a page from the patient's nurse to support the family in the wake of her extubation and being made Comfort Care. Upon arrival, the patient's best friend and sister-in-law was at the bedside. She was tearful and shared her love for her best friend. She confirmed that she and the patients have shared a 15-year friendship and had lots of amazing times together. She laments that the patient has had a difficult road and that her life seems to be ending much too soon. Once the patient's daughters returned to the room, we all sat/stood around the patient's bed and talked about what it means to grapple with the reality of the patient's anticipated death. Her daughters are supportive to/for each other, but cannot imagine life without the patient/their mother. This chaplain, the patient's nurse, and the Nurse Practitioner provided compassionate presence, active and reflective listening, and comfort to the grieving family.

## 2018-10-03 NOTE — Progress Notes (Signed)
No family at bedside, bed alarm turned back on.  After emptying patient's foley, upon entering the bathroom, it smells like cigarette smoke.  Before I could even ask patient about it, she stated that she was sorry about it smelling like smoke that a family member that was here earlier was in there smoking and that she asked her to leave.  I do not believe that is the case, I believe that the patient was in fact smoking in the bathroom herself.  Educated patient that no one should be in the building smoking especially with her being on oxygen, patient verbalized understanding.  Melvia Heaps, [facility] aware.  Clarise Cruz, BSN

## 2018-10-03 NOTE — Consult Note (Signed)
Consultation Note Date: 10/03/2018   Patient Name: Katie Guzman  DOB: [birth year]  MRN: [id number]  Age / Sex: 48 y.o., female   PCP: Medicine, [facility] Referring Physician: Gladstone Lighter, MD   REASON FOR CONSULTATION:Establishing goals of care  Palliative Care consult requested for goals of care in this 48 y.o. 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 [facility] of worsening shortness of breath. She was recently hospitalized (2 ICU admissions) for similar concerns with one admission requiring intubation. On arrival to ED patient required BiPAP for respiratory support. Chest x-ray showed new left-sided infiltrates. She was started on IV Vancomycin and cefepime post blood cultures. Since admission patient continues to require increased oxygen use.   Clinical Assessment and Goals of Care: I have reviewed medical records including lab results, imaging, Epic notes, and MAR, received report from the bedside RN, and assessed the patient. I met at the bedside with patient and her 2 daughters ([place] and [place]) to discuss diagnosis prognosis, [place], EOL wishes, disposition and options. Patient sitting up in the recliner attempting to eat lunch. She is awake, alert, and oriented x3. Denies pain, endorses shortness of breath at times.   I introduced Palliative Medicine as specialized medical care for people living with serious illness. It focuses on providing relief from the symptoms and stress of a serious illness. The goal is to improve quality of life for both the patient and the family.  We discussed a brief life review of the patient, along with her functional and nutritional status.   Patient reports she resides in the home with both of  her daughters and 5 grandchildren. 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 She enjoys spending time with family and friends.   Prior to admission patient and daughters share her noticeable decline. She endorses dysphagia, weight loss, increased fatigue, and shortness of breath. She reports attempts to eat soft foods. She utilized a walker for gait stability in the home. She is on continuous home oxygen 3L. Daughters state Sonya, Therapist, sports with [facility] has been involved briefly in her care since previous admission.   We discussed Her current illness and what it means in the larger context of Her on-going co-morbidities. With specific discussions regarding her weight loss, respiratory failure, cancer, generalized weakness, and overall functional and nutritional decline. Natural disease trajectory and expectations at EOL were discussed.  Patient and daughters verbalized understanding. Patient reports her awareness of her continued decline and poor prognosis. She states "at this point in my health and life, I am tired of suffering. It has been a rough month and I am tired of going through the motions. I just want to be home with my family spending time with them and making/sharing memories until God calls me home!" Emotional support provided. Both daughters are tearful. Patient is not tearful during discussion. She looks at daughters and began expressing her love for them and appreciation of all of their care.   Daughter, Vikki Ports tearful sharing she loss her job due to [place] and missing extended amounts of time to assist with caring for her mom. She shares she wishes there was a different outcome. Patient verbalized understanding and stating to her daughters "all of your love and care was not in vane, you all  have gone above and beyond for me and I know we were hopeful and now we know I am dying, so let's make the most, take selfies, and love eachother until knowing my time is limited!"    Patient reports she has small grandchildren in the home ranging from the age of 3-9 years old. She has been preparing them for this moment stating she knew she would pass away soon and possibly in the home which is her wishes if at all possible. Daughters are tearful expressing their concerns of her pain and shortness of breath being managed appropriately. Patient reports having a home trilogy machine which she is not using and does not care for. Daughter is tearful in expressing patient's distress prior to admission which prompted them to call 911.   I attempted to elicit values and goals of care important to the patient.    The difference between aggressive medical intervention and comfort care was considered in light of the patient's goals of care. I educated patient/family on what comfort care measures would look like. I discussed with comfort care, patient would no longer receive aggressive medical interventions such as continuous vital signs, lab work, radiology testing, or medications not focused on comfort. All care will focus on how the patient is looking and feeling. This will include management of any symptoms that may cause discomfort, pain, shortness of breath, cough, nausea, agitation, anxiety, and/or secretions etc. Symptoms will be managed with medications and other non-pharmacological interventions such as spiritual support if requested, repositioning, music therapy, or therapeutic listening. Family and patient verbalized understanding and appreciation.   Given patient's wishes to return home I educated her and her daughters on hospice support in the home. She understands they do not provide 24/7 care and family are ultimately the caregivers with continued support from hospice. I educated them on the goal of treating in the home and eliminating recurrent hospitalizations. Daughters and patient verbalized understanding. Their wishes remain for patient to hopefully get home with outpatient  hospice support. Education also provided on residential hospice facility with consideration of patient's symptoms and stability. They are aware patient could potentially transfer to the facility pending bed availability if she gets home and unable to effectively manage symptoms.   Patient reports both of her daughters are her 30. Patient and daughters mutually verbalized wishes for DNR/DNI.   Patient and family verbalized their understanding and awareness of hospice's goals and philosophy of care. They are requesting services at discharge. Daughters express potential need for home suction canister and yaunker for secretions, bedside commode, and oxygen concentrator. They decline hospital bed at this time.   Questions and concerns were addressed.  Hard Choices booklet left for review. The family was encouraged to call with questions or concerns.  PMT will continue to support holistically.   SOCIAL HISTORY:     reports that she has been smoking cigarettes. She has a 15.00 pack-year smoking history. She has never used smokeless tobacco. She reports that she does not drink alcohol or use drugs.  CODE STATUS: DNR  ADVANCE DIRECTIVES:Next of Kin (daughters [place] and [place])    SYMPTOM MANAGEMENT: fentanyl patch, Oxy IR, Robinul  Palliative Prophylaxis:   Aspiration, Bowel Regimen, Frequent Pain Assessment and Oral Care  PSYCHO-SOCIAL/SPIRITUAL:  Support System: Family  Desire for further Chaplaincy support:Yes   Additional Recommendations (Limitations, Scope, Preferences):  Full Comfort Care   PAST MEDICAL HISTORY: Past Medical History:  Diagnosis Date   Asthma    Cancer ([place]) 2019  laryngeal cancer   Cellulitis 04/15/2015   COPD (chronic obstructive pulmonary disease) (HCC)    Diabetes mellitus    Poorly controlled with complications   Emphysema    Fibromyalgia    Neuropathy    Tenosynovitis of foot 04/16/2015    PAST SURGICAL HISTORY:  Past Surgical History:   Procedure Laterality Date   AMPUTATION Right 08/19/2018   Procedure: AMPUTATION RAY;  Surgeon: Sharlotte Alamo, DPM;  Location: [facility] ORS;  Service: Podiatry;  Laterality: Right;   AMPUTATION TOE Right 08/19/2018   Procedure: AMPUTATION TOE;  Surgeon: Sharlotte Alamo, DPM;  Location: [facility] ORS;  Service: Podiatry;  Laterality: Right;   BACK SURGERY     CESAREAN SECTION     TONSILLECTOMY     TUBAL LIGATION     WOUND DEBRIDEMENT Right 08/19/2018   Procedure: DEBRIDEMENT WOUND RIGHT FOOT, DIABETIC;  Surgeon: Sharlotte Alamo, DPM;  Location: [facility] ORS;  Service: Podiatry;  Laterality: Right;    ALLERGIES:  is allergic to flexeril [cyclobenzaprine]; hydrocodone; other; darvocet [propoxyphene n-acetaminophen]; fish-derived products; propofol; toradol [ketorolac tromethamine]; vicodin [hydrocodone-acetaminophen]; ketorolac; and tramadol.   MEDICATIONS:  Current Facility-Administered Medications  Medication Dose Route Frequency Provider Last Rate Last Dose   fentaNYL (DURAGESIC) 25 MCG/HR 1 patch  1 patch Transdermal Q72H Ottie Glazier, MD   1 patch at 10/03/18 1138   glycopyrrolate (ROBINUL) injection 0.4 mg  0.4 mg Intravenous Q4H PRN Tukov-Yual, Magdalene S, NP       haloperidol lactate (HALDOL) injection 2 mg  2 mg Intravenous Q6H PRN Tyler Pita, MD       LORazepam (ATIVAN) injection 1 mg  1 mg Intravenous Q4H PRN Tukov-Yual, Magdalene S, NP       nicotine (NICODERM CQ - dosed in mg/24 hours) patch 21 mg  21 mg Transdermal Daily Ottie Glazier, MD   21 mg at 10/03/18 1138   ondansetron (ZOFRAN) tablet 4 mg  4 mg Oral Q6H PRN Gladstone Lighter, MD       Or   ondansetron [facility]) injection 4 mg  4 mg Intravenous Q6H PRN Gladstone Lighter, MD       oxyCODONE (Oxy IR/ROXICODONE) immediate release tablet 10 mg  10 mg Oral Q4H PRN Ottie Glazier, MD   10 mg at 10/03/18 1139    VITAL SIGNS: BP 128/75 (BP Location: Right Arm)    Pulse 93    Temp 98.3 F (36.8 C) (Oral)    Resp 19     Ht 5' 2" (1.575 m)    Wt 51.9 kg    SpO2 100%    BMI 20.93 kg/m  Filed Weights   10/09/2018 1044  Weight: 51.9 kg    Estimated body mass index is 20.93 kg/m as calculated from the following:   Height as of this encounter: 5' 2" (1.575 m).   Weight as of this encounter: 51.9 kg.  LABS: CBC:    Component Value Date/Time   WBC 18.0 (H) 10/01/2018 1041   HGB 11.1 (L) 10/09/2018 1041   HGB 14.9 09/05/2013 0213   HCT 35.7 (L) 09/27/2018 1041   HCT 45.5 09/05/2013 0213   PLT 485 (H) 09/18/2018 1041   PLT 298 09/05/2013 0213   Comprehensive Metabolic Panel:    Component Value Date/Time   NA 127 (L) 10/01/2018 1041   NA 137 09/05/2013 0213   K 4.8 09/25/2018 1041   K 4.0 09/05/2013 0213   CO2 32 10/07/2018 1041   CO2 29 09/05/2013 0213   BUN 13 10/03/2018 1041  BUN 12 09/05/2013 0213   CREATININE 0.45 09/28/2018 1041   CREATININE 0.79 09/05/2013 0213   ALBUMIN 3.2 (L) 09/20/2018 1041   ALBUMIN 3.1 (L) 09/05/2013 0213     Review of Systems  Constitutional: Positive for appetite change and fatigue.  Respiratory: Positive for shortness of breath.   Neurological: Positive for weakness.  All other systems reviewed and are negative.  Physical Exam General: NAD, frail chronically-ill appearing, thin Cardiovascular: regular rate and rhythm Pulmonary: rhonchi, oral secretions  Abdomen: soft, nontender, + bowel sounds GU: no suprapubic tenderness Skin: no rashes, warm, dry, intact Neurological: Weakness but otherwise nonfocal   Prognosis: days to weeks in the setting of full comfort, necrotic airway, COPD, worsening laryngeal cancer, renal failure, protein calorie malnutrition, electrolyte imbalance.   Discharge Planning:  Home with Hospice  Recommendations:  DNR/DNI-as confirmed by patient and daughters  Full Comfort Care  Patient/daughters are hopeful patient will be able to d/c home with outpatient hospice for EOL support. Patient requesting to remain with [facility] and utilize their hospice team if available. (CM referral placed)  Potential home equipment needs, oxygen, removal of trilogy, bedside commode, and home suctioning system with yaunker  Fentanyl patch for pain (may require increase to 102mg if no relief with a goal of comfort)   Oxy IR PRN for pain (may benefit for liquid Oxyfast in the event becomes difficult for pills)  Patient was originally on morphine but requested it be discontinued. May require IV PRN for pain control if pain not effectively managed.   Robinul PRN for secretions  Ativan PRN for anxiety  Wean oxygen as tolerated to comfort (baseline)  PMT will continue to support and follow   Palliative Performance Scale: PPS 20%               Patient and daughters expressed understanding and was in agreement with this plan.   Thank you for allowing the Palliative Medicine Team to assist in the care of this patient.  Time In: 1330 Time Out: 1445 Time Total: 75 min.   Visit consisted of counseling and education dealing with the complex and emotionally intense issues of symptom management and palliative care in the setting of serious and potentially life-threatening illness.Greater than 50%  of this time was spent counseling and coordinating care related to the above assessment and plan.  Signed by:  NAlda Lea AGPCNP-BC Palliative Medicine Team  Phone: [phone]: [phone]: [phone]: NBjorn Pippin

## 2018-10-03 NOTE — Progress Notes (Signed)
Pharmacy Antibiotic Note  Katie Guzman is a 48 y.o. female admitted on 09/23/2018 with pneumonia.  Pharmacy has been consulted for Cefepime, Vancomycin  dosing.  Plan: Cefepime 2 gm IV Q8H ordered to start on 8/24 @ 2100.  Vancomycin 1250 mg IV x 1 ordered to be given on 8/24 @ ~ 2200. Vancomycin 1 gm IV Q24H ordered to start on 8/24 @ 2200. No peak or trough currently ordered.   CrCl = 67 ml/min Ke = 0.06 hr-1 T1/2 = 11.5 hrs Vd = 37.4 L  AUC = 444.2 Vanc trough = 8.8   Height: 5\' 2"  (157.5 cm) Weight: 114 lb 6.7 oz (51.9 kg) IBW/kg (Calculated) : 50.1  Temp (24hrs), Avg:98.6 F (37 C), Min:98.3 F (36.8 C), Max:98.9 F (37.2 C)  Recent Labs  Lab 09/14/2018 1041  WBC 18.0*  CREATININE 0.45    Estimated Creatinine Clearance: 67.3 mL/min (by C-G formula based on SCr of 0.45 mg/dL).    Allergies  Allergen Reactions  . Flexeril [Cyclobenzaprine] Hives and Nausea And Vomiting  . Hydrocodone Hives  . Other Hives  . Darvocet [Propoxyphene N-Acetaminophen] Hives and Nausea And Vomiting  . Fish-Derived Products Other (See Comments)    Patient does not like to eat fish. Not allergic but prefers not to.  . Propofol Nausea And Vomiting  . Toradol [Ketorolac Tromethamine] Nausea And Vomiting  . Vicodin [Hydrocodone-Acetaminophen] Hives and Nausea And Vomiting  . Ketorolac Nausea And Vomiting  . Tramadol Nausea And Vomiting    Vomiting only    Antimicrobials this admission:   >>    >>   Dose adjustments this admission:   Microbiology results:  BCx:   UCx:   Sputum:    MRSA PCR:   Thank you for allowing pharmacy to be a part of this patient's care.  Macaila Tahir D 10/03/2018 8:49 PM

## 2018-10-03 NOTE — Progress Notes (Signed)
Ch met briefly w/ pt's daughter who was tearful yet hopeful since the pt was willing to eat a meal. Daughter shared that she would try to spend as much quality time with her mom as she can and the ch shared that at this time that may be best considering she is alert and talking at this time. Ch followed up shortly after with the pt only who was finishing her meal. Pt was in the midst of being transferred to 105 but welcomed the visited. Ch shared she would f/u at a later time.    10/03/18 1500  Clinical Encounter Type  Visited With Patient;Family  Visit Type Follow-up;Psychological support;Spiritual support;Social support  Referral From Chaplain  Consult/Referral To Chaplain  Spiritual Encounters  Spiritual Needs Grief support;Emotional  Stress Factors  Patient Stress Factors Exhausted;Health changes;Loss of control  Family Stress Factors Loss of control;Major life changes

## 2018-10-03 NOTE — Progress Notes (Signed)
[facility] NAME: Katie Guzman    MR#:  [id number]  DATE OF BIRTH:  Sep 20, 1970  SUBJECTIVE:  CHIEF COMPLAINT:   Chief Complaint  Patient presents with  . Respiratory Distress   -Patient with chronic COPD on home oxygen, laryngeal carcinoma admitted with acute respiratory distress. -Placed on BiPAP, was in ICU.  After discussion with ICU attending and the daughters-patient is made comfort care.  Currently resting on high flow nasal cannula. -On morphine drip  REVIEW OF SYSTEMS:  Review of Systems  Unable to perform ROS: Critical illness    DRUG ALLERGIES:   Allergies  Allergen Reactions  . Flexeril [Cyclobenzaprine] Hives and Nausea And Vomiting  . Hydrocodone Hives  . Other Hives  . Darvocet [Propoxyphene N-Acetaminophen] Hives and Nausea And Vomiting  . Fish-Derived Products Other (See Comments)    Patient does not like to eat fish. Not allergic but prefers not to.  . Propofol Nausea And Vomiting  . Toradol [Ketorolac Tromethamine] Nausea And Vomiting  . Vicodin [Hydrocodone-Acetaminophen] Hives and Nausea And Vomiting  . Ketorolac Nausea And Vomiting  . Tramadol Nausea And Vomiting    Vomiting only    VITALS:  Blood pressure 139/70, pulse 89, temperature 98.9 F (37.2 C), temperature source Oral, resp. rate 19, height 5\' 2"  (1.575 m), weight 51.9 kg, SpO2 100 %.  PHYSICAL EXAMINATION:  Physical Exam  GENERAL:  48 y.o.-year-old patient lying in the bed,  critically ill-appearing EYES: Pupils equal, round, reactive to light and accommodation. No scleral icterus. Extraocular muscles intact.  HEENT: Head atraumatic, normocephalic. Oropharynx and nasopharynx clear.  NECK:  Supple, no jugular venous distention. No thyroid enlargement, no tenderness.  LUNGS: Coarse, rhonchorous breath sounds bilaterally, decreased at the bases.  No wheezing, rales  or crepitation. No use of accessory muscles of respiration.   CARDIOVASCULAR: S1, S2 normal. No murmurs, rubs, or gallops.  ABDOMEN: Soft, nontender, nondistended. Bowel sounds present. No organomegaly or mass.  EXTREMITIES: No pedal edema, cyanosis, or clubbing.  NEUROLOGIC: Patient is sedated, not following any commands at this time.    No facial droop identified.  Able to move all extremities in bed.  Sensation seems to be intact.  Withdrawing to touch..  PSYCHIATRIC: The patient is sedated SKIN: No obvious rash, lesion, or ulcer.    LABORATORY PANEL:   CBC Recent Labs  Lab 09/12/2018 1041  WBC 18.0*  HGB 11.1*  HCT 35.7*  PLT 485*   ------------------------------------------------------------------------------------------------------------------  Chemistries  Recent Labs  Lab 09/24/2018 1041  NA 127*  K 4.8  CL 86*  CO2 32  GLUCOSE 223*  BUN 13  CREATININE 0.45  CALCIUM 8.8*  AST 16  ALT 14  ALKPHOS 108  BILITOT 0.5   ------------------------------------------------------------------------------------------------------------------  Cardiac Enzymes No results for input(s): TROPONINI in the last 168 hours. ------------------------------------------------------------------------------------------------------------------  RADIOLOGY:  Dg Chest Port 1 View  Result Date: 09/24/2018 CLINICAL DATA:  Patient with respiratory distress. EXAM: PORTABLE CHEST 1 VIEW COMPARISON:  Chest radiograph September 18, 2018 FINDINGS: Monitoring leads overlie the patient. Stable enlarged cardiac and mediastinal contours. Interval development of patchy consolidation left mid and lower lung. Persistent heterogeneous opacities right lung base. No pleural effusion or pneumothorax. IMPRESSION: Interval development of heterogeneous opacities left mid lower lung which may represent pneumonia in the appropriate clinical setting. Right basilar opacities favored to represent atelectasis. Infection not excluded. Electronically Signed   By: Katie Guzman M.D.   On:  09/13/2018 11:22  EKG:   Orders placed or performed during the hospital encounter of 10/06/2018  . ED EKG  . ED EKG  . EKG 12-Lead  . EKG 12-Lead    ASSESSMENT AND PLAN:   Katie Guzman  is a 48 y.o. 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  Patient got admitted to ICU for acute hypoxic respiratory failure secondary to COPD and pneumonia.  Was on BiPAP, was also noted to be extremely delirious.  Patient has had significant decline in her physical health since 2019/since her cancer was diagnosed. Severe odynophagia from radiation, 3 ICU hospital admissions within the last 3 weeks.  Most recent laryngoscopy performed at [facility] showing significant necrosis of the epiglottis and the vocal cords cartilage.  ICU attending had discussion with patient's daughters and they have transitioned patient to comfort care. She is placed on high flow nasal cannula at this time for comfort.  On morphine drip.  PRN Haldol and Ativan.  Below are her current diagnosis:  1.  Acute chronic hypoxic respiratory failure-secondary to COPD exacerbation and HCAP -Patient has had 2 ICU hospitalizations within the last 3 weeks.  1 of these hospitalizations, she was even intubated.  2.  Chronic pain-continue oxycodone.  Patient follows with pain management clinic.     3.  Diabetes mellitus-   4. Hyponatremia- hypovolemia vs SIADH from squamous cell carc  5.  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  Patient finished radiation in December 2019.     - poor prognosis, palliative care consulted    Both daughters at bedside but sleeping this morning     All the  records are reviewed and case discussed with Care Management/Social Workerr. Management plans discussed with the patient, family and they are in agreement.  CODE STATUS: DNR  TOTAL TIME TAKING CARE OF THIS PATIENT: 22 minutes.   POSSIBLE D/C IN ? DAYS, DEPENDING ON CLINICAL CONDITION.   Gladstone Lighter M.D on 10/03/2018 at 9:21 AM  Between 7am to 6pm - Pager - [phone]  After 6pm go to [email or web address] - password [email or web address] [place] [facility]  Office  [phone]  CC: Primary care physician; Medicine, [facility]

## 2018-10-03 NOTE — Progress Notes (Signed)
CRITICAL CARE PROGRESS NOTE    Name: Katie Guzman MRN: [id number] DOB: 01/09/1971     LOS: 1   SUBJECTIVE FINDINGS & SIGNIFICANT EVENTS   Patient description:  48 yo F with DM, COPD, chronic hypoxemia on 3L home oxygen with advanced laryngeal CA s/p rad/onc and subsequent devt of necrosis in upper airway.  She was smoking in room today and said she needed an ash tray. Most recent plan is for comfort measures.    Lines / Drains: PIV  Cultures / Sepsis markers: Negative blood cultures  Antibiotics: none   Protocols / Consultants: palliative  Tests / Events: none  Overnight: none   PAST MEDICAL HISTORY   Past Medical History:  Diagnosis Date  . Asthma   . Cancer ([place]) 2019   laryngeal cancer  . Cellulitis 04/15/2015  . COPD (chronic obstructive pulmonary disease) ([place])   . Diabetes mellitus    Poorly controlled with complications  . Emphysema   . Fibromyalgia   . Neuropathy   . Tenosynovitis of foot 04/16/2015     SURGICAL HISTORY   Past Surgical History:  Procedure Laterality Date  . AMPUTATION Right 08/19/2018   Procedure: AMPUTATION RAY;  Surgeon: Sharlotte Alamo, DPM;  Location: [facility] ORS;  Service: Podiatry;  Laterality: Right;  . AMPUTATION TOE Right 08/19/2018   Procedure: AMPUTATION TOE;  Surgeon: Sharlotte Alamo, DPM;  Location: [facility] ORS;  Service: Podiatry;  Laterality: Right;  . BACK SURGERY    . CESAREAN SECTION    . TONSILLECTOMY    . TUBAL LIGATION    . WOUND DEBRIDEMENT Right 08/19/2018   Procedure: DEBRIDEMENT WOUND RIGHT FOOT, DIABETIC;  Surgeon: Sharlotte Alamo, DPM;  Location: [facility] ORS;  Service: Podiatry;  Laterality: Right;     FAMILY HISTORY   Family History  Problem Relation Age of Onset  . Lung cancer Mother      SOCIAL HISTORY   Social History    Tobacco Use  . Smoking status: Current Some Day Smoker    Packs/day: 0.50    Years: 30.00    Pack years: 15.00    Types: Cigarettes    Last attempt to quit: 10/05/2014    Years since quitting: 3.9  . Smokeless tobacco: Never Used  Substance Use Topics  . Alcohol use: No  . Drug use: No     MEDICATIONS   Current Medication:  Current Facility-Administered Medications:  .  fentaNYL (DURAGESIC) 25 MCG/HR 1 patch, 1 patch, Transdermal, Q72H, Ottie Glazier, MD, 1 patch at 10/03/18 1138 .  glycopyrrolate (ROBINUL) injection 0.4 mg, 0.4 mg, Intravenous, Q4H PRN, Tukov-Yual, Magdalene S, NP .  haloperidol lactate (HALDOL) injection 2 mg, 2 mg, Intravenous, Q6H PRN, Tyler Pita, MD .  LORazepam (ATIVAN) injection 1 mg, 1 mg, Intravenous, Q4H PRN, Tukov-Yual, Magdalene S, NP .  nicotine (NICODERM CQ - dosed in mg/24 hours) patch 21 mg, 21 mg, Transdermal, Daily, Lanney Gins, Shaela Boer, MD, 21 mg at 10/03/18 1138 .  ondansetron (ZOFRAN) tablet 4 mg, 4 mg, Oral, Q6H PRN **OR** ondansetron (ZOFRAN) injection 4 mg, 4 mg, Intravenous, Q6H PRN, Gladstone Lighter, MD .  oxyCODONE (Oxy IR/ROXICODONE) immediate release tablet 10 mg, 10 mg, Oral, Q4H PRN, Lanney Gins, Britiney Blahnik, MD, 10 mg at 10/03/18 1139    ALLERGIES   Flexeril [cyclobenzaprine], Hydrocodone, Other, Darvocet [propoxyphene n-acetaminophen], Fish-derived products, Propofol, Toradol [ketorolac tromethamine], Vicodin [hydrocodone-acetaminophen], Ketorolac, and Tramadol    REVIEW OF SYSTEMS    10 point ROS negative except as per subjective findings.  PHYSICAL EXAMINATION   Vital Signs: Temp:  [98.9 F (37.2 C)-99.4 F (37.4 C)] 98.9 F (37.2 C) (08/24 0200) Pulse Rate:  [89-97] 89 (08/24 0200) Resp:  [18-21] 19 (08/24 0200) BP: (139-166)/(70-75) 139/70 (08/23 1600) SpO2:  [93 %-100 %] 100 % (08/24 0200) FiO2 (%):  [40 %-45 %] 40 % (08/24 0200)  GENERAL:Mild distress due to pain  HEAD: Normocephalic, atraumatic.  EYES:  Pupils equal, round, reactive to light.  No scleral icterus.  MOUTH: Moist mucosal membrane. NECK: Supple. No thyromegaly. No nodules. No JVD.  PULMONARY: rhonhci mildly bilaterally  CARDIOVASCULAR: S1 and S2. Regular rate and rhythm. No murmurs, rubs, or gallops.  GASTROINTESTINAL: Soft, nontender, non-distended. No masses. Positive bowel sounds. No hepatosplenomegaly.  MUSCULOSKELETAL: No swelling, clubbing, or edema.  NEUROLOGIC: Mild distress due to acute illness SKIN:intact,warm,dry   PERTINENT DATA     Infusions:  Scheduled Medications: . fentaNYL  1 patch Transdermal Q72H  . nicotine  21 mg Transdermal Daily   PRN Medications: glycopyrrolate, haloperidol lactate, LORazepam, ondansetron **OR** ondansetron (ZOFRAN) IV, oxyCODONE Hemodynamic parameters:   Intake/Output: 08/23 0701 - 08/24 0700 In: 643.1 [I.V.:193.1; IV Piggyback:450] Out: [id number] [Urine:4150]  Ventilator  Settings: FiO2 (%):  [40 %-45 %] 40 %    LAB RESULTS:  Basic Metabolic Panel: Recent Labs  Lab 09/22/2018 1041  NA 127*  K 4.8  CL 86*  CO2 32  GLUCOSE 223*  BUN 13  CREATININE 0.45  CALCIUM 8.8*   Liver Function Tests: Recent Labs  Lab 09/25/2018 1041  AST 16  ALT 14  ALKPHOS 108  BILITOT 0.5  PROT 6.8  ALBUMIN 3.2*   No results for input(s): LIPASE, AMYLASE in the last 168 hours. No results for input(s): AMMONIA in the last 168 hours. CBC: Recent Labs  Lab 10/08/2018 1041  WBC 18.0*  NEUTROABS 17.0*  HGB 11.1*  HCT 35.7*  MCV 88.4  PLT 485*   Cardiac Enzymes: No results for input(s): CKTOTAL, CKMB, CKMBINDEX, TROPONINI in the last 168 hours. BNP: Invalid input(s): POCBNP CBG: Recent Labs  Lab 09/13/2018 1303  GLUCAP 232*     IMAGING RESULTS:  Imaging: Dg Chest Port 1 View  Result Date: 09/19/2018 CLINICAL DATA:  Patient with respiratory distress. EXAM: PORTABLE CHEST 1 VIEW COMPARISON:  Chest radiograph September 18, 2018 FINDINGS: Monitoring leads overlie the patient.  Stable enlarged cardiac and mediastinal contours. Interval development of patchy consolidation left mid and lower lung. Persistent heterogeneous opacities right lung base. No pleural effusion or pneumothorax. IMPRESSION: Interval development of heterogeneous opacities left mid lower lung which may represent pneumonia in the appropriate clinical setting. Right basilar opacities favored to represent atelectasis. Infection not excluded. Electronically Signed   By: Lovey Newcomer M.D.   On: 10/05/2018 11:22   @PROBHOSP @   ASSESSMENT AND PLAN    -Multidisciplinary rounds held today  Acute on chronic Hypoxic Respiratory Failure -due to advanced COPD with laryngeal CA worsening   Moderate Acute COPD exacerbation   - COPD care path    - duonebs q6h   -    CARDIAC FAILURE -oxygen as needed -Lasix as tolerated -follow up cardiac enzymes as indicated ICU monitoring  Renal Failure-most likely due to ATN -follow chem 7 -follow UO -continue Foley Catheter-assess need daily   NEUROLOGY - intubated and sedated - minimal sedation to achieve a RASS goal: -1 Wake up assessment pending   Septic shock -use vasopressors to keep MAP>65 -follow ABG and LA -follow up cultures -emperic ABX -consider stress dose  steroids   ID -continue IV abx as prescibed -follow up cultures  GI/Nutrition GI PROPHYLAXIS as indicated DIET-->TF's as tolerated Constipation protocol as indicated  ENDO - ICU hypoglycemic\Hyperglycemia protocol -check FSBS per protocol   ELECTROLYTES -follow labs as needed -replace as needed -pharmacy consultation   DVT/GI PRX ordered -SCDs  TRANSFUSIONS AS NEEDED MONITOR FSBS ASSESS the need for LABS as needed   Critical care provider statement:    Critical care time (minutes):  36   Critical care time was exclusive of:  Separately billable procedures and treating other patients   Critical care was necessary to treat or prevent imminent or life-threatening  deterioration of the following conditions:  Acute hypoxemic respiratory failure, Acute COPD exacerbation ,  Worsening laryngeal CA with upper airway necrosis,  Tobbacco abuse, multiple comorbid conditions.    Critical care was time spent personally by me on the following activities:  Development of treatment plan with patient or surrogate, discussions with consultants, evaluation of patient's response to treatment, examination of patient, obtaining history from patient or surrogate, ordering and performing treatments and interventions, ordering and review of laboratory studies and re-evaluation of patient's condition.  I assumed direction of critical care for this patient from another provider in my specialty: no    This document was prepared using Dragon voice recognition software and may include unintentional dictation errors.    Ottie Glazier, M.D.  Division of [place]

## 2018-10-03 NOTE — Progress Notes (Signed)
1500 Transferred to 105 via bed with oxygen. Report previously given to [facility] on 1C.

## 2018-10-04 LAB — NOVEL CORONAVIRUS, NAA (HOSP ORDER, SEND-OUT TO REF LAB; TAT 18-24 HRS): SARS-CoV-2, NAA: NOT DETECTED

## 2018-10-04 MED ORDER — SCOPOLAMINE 1 MG/3DAYS TD PT72
1.0000 | MEDICATED_PATCH | TRANSDERMAL | Status: DC
  Administered 2018-10-04 – 2018-10-10 (×3): 1.5 mg via TRANSDERMAL
  Filled 2018-10-04 (×3): qty 1

## 2018-10-04 MED ORDER — FENTANYL 25 MCG/HR TD PT72: 1.0000 | MEDICATED_PATCH | TRANSDERMAL | 0 refills | Status: AC

## 2018-10-04 MED ORDER — SCOPOLAMINE 1 MG/3DAYS TD PT72: 1.0000 | MEDICATED_PATCH | TRANSDERMAL | 0 refills | Status: AC

## 2018-10-04 MED ORDER — OXYCODONE HCL 5 MG PO TABS
10.0000 mg | ORAL_TABLET | ORAL | Status: DC | PRN
  Administered 2018-10-04: 15 mg via ORAL
  Filled 2018-10-04: qty 3

## 2018-10-04 MED ORDER — OXYCODONE HCL 5 MG PO TABS: 10.0000 mg | ORAL_TABLET | ORAL | 0 refills | Status: AC | PRN

## 2018-10-04 MED ORDER — LORAZEPAM 1 MG PO TABS: 1.0000 mg | ORAL_TABLET | Freq: Three times a day (TID) | ORAL | 0 refills | Status: AC | PRN

## 2018-10-04 NOTE — Progress Notes (Signed)
[place] [facility] [place] NAME: Katie Guzman    MR#:  [id number]  DATE OF BIRTH:  09/12/1970  SUBJECTIVE:  CHIEF COMPLAINT:   Chief Complaint  Patient presents with  . Respiratory Distress   -Patient with laryngeal carcinoma, COPD, ongoing smoking, home oxygen presents with acute respiratory distress.  Was on BiPAP in the ICU.  Transition to hospice care and transfer to floor. -Patient is alert this morning.  Has shortness of breath and cough.  REVIEW OF SYSTEMS:  Review of Systems  Constitutional: Positive for malaise/fatigue. Negative for chills and fever.  HENT: Positive for sore throat.   Eyes: Negative for blurred vision and double vision.  Respiratory: Positive for cough and shortness of breath. Negative for wheezing.   Cardiovascular: Negative for chest pain and palpitations.  Gastrointestinal: Negative for abdominal pain, constipation, diarrhea, nausea and vomiting.  Genitourinary: Negative for dysuria.  Musculoskeletal: Positive for back pain and myalgias.  Neurological: Positive for weakness. Negative for dizziness, focal weakness, seizures and headaches.  Psychiatric/Behavioral: Negative for depression.    DRUG ALLERGIES:   Allergies  Allergen Reactions  . Flexeril [Cyclobenzaprine] Hives and Nausea And Vomiting  . Hydrocodone Hives  . Other Hives  . Darvocet [Propoxyphene N-Acetaminophen] Hives and Nausea And Vomiting  . Fish-Derived Products Other (See Comments)    Patient does not like to eat fish. Not allergic but prefers not to.  . Propofol Nausea And Vomiting  . Toradol [Ketorolac Tromethamine] Nausea And Vomiting  . Vicodin [Hydrocodone-Acetaminophen] Hives and Nausea And Vomiting  . Ketorolac Nausea And Vomiting  . Tramadol Nausea And Vomiting    Vomiting only    VITALS:  Blood pressure (!) 163/77, pulse 83, temperature 98.6 F (37 C), temperature source Oral, resp. rate 19, height '5\' 2"'  (1.575 m), weight  51.9 kg, SpO2 100 %.  PHYSICAL EXAMINATION:  Physical Exam  GENERAL:  48 y.o.-year-old patient lying in the bed, chronically ill-appearing.  Complaints of dyspnea. EYES: Pupils equal, round, reactive to light and accommodation. No scleral icterus. Extraocular muscles intact.  HEENT: Head atraumatic, normocephalic. Oropharynx and nasopharynx clear.  NECK:  Supple, no jugular venous distention. No thyroid enlargement, no tenderness.  LUNGS: Coarse, rhonchorous breath sounds bilaterally, decreased at the bases.  Some audible wheezing. No expiratory wheezing, rales  or crepitation. No use of accessory muscles of respiration.  CARDIOVASCULAR: S1, S2 normal. No murmurs, rubs, or gallops.  ABDOMEN: Soft, nontender, nondistended. Bowel sounds present. No organomegaly or mass.  EXTREMITIES: No pedal edema, cyanosis, or clubbing.  NEUROLOGIC: Patient is alert, cranial nerves seems to be intact.  Able to move all extremities in bed.  Global weakness noted.  Sensation is intact.  Gait is not tested...  PSYCHIATRIC: The patient is  alert and oriented x3 SKIN: No obvious rash, lesion, or ulcer.    LABORATORY PANEL:   CBC Recent Labs  Lab 09/16/2018 1041  WBC 18.0*  HGB 11.1*  HCT 35.7*  PLT 485*   ------------------------------------------------------------------------------------------------------------------  Chemistries  Recent Labs  Lab 09/10/2018 1041  NA 127*  K 4.8  CL 86*  CO2 32  GLUCOSE 223*  BUN 13  CREATININE 0.45  CALCIUM 8.8*  AST 16  ALT 14  ALKPHOS 108  BILITOT 0.5   ------------------------------------------------------------------------------------------------------------------  Cardiac Enzymes No results for input(s): TROPONINI in the last 168 hours. ------------------------------------------------------------------------------------------------------------------  RADIOLOGY:  No results found.  EKG:   Orders placed or performed during the hospital encounter  of 10/08/2018  .  ED EKG  . ED EKG  . EKG 12-Lead  . EKG 12-Lead    ASSESSMENT AND PLAN:   Katie Guzman  is a 48 y.o. 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  Patient got admitted to ICU for acute hypoxic respiratory failure secondary to COPD and pneumonia.  Was on BiPAP, was also noted to be extremely delirious.  Patient has had significant decline in her physical health since 2019/since her cancer was diagnosed. Severe odynophagia from radiation, 3 ICU hospital admissions within the last 3 weeks.  Most recent laryngoscopy performed at [facility] showing significant necrosis of the epiglottis and the vocal cords cartilage.  ICU attending had discussion with patient's daughters and they have transitioned patient to comfort care. She was placed on high flow nasal cannula.  Patient is more alert and is transition to floor status.  Palliative care has met with the family.  Plan is possible discharge home with hospice services. -However patient's daughter requested to start antibiotics last night and also requested for IV fluids.  Palliative care to discuss goals of hospice care at this time.  Below are her current diagnosis:  1.  Acute chronic hypoxic respiratory failure-secondary to COPD exacerbation and HCAP -Patient has had 2 ICU hospitalizations within the last 3 weeks.  1 of these hospitalizations, she was even intubated. -Vancomycin discontinued.  Currently on cefepime until palliative care discusses transition to hospice. -On nicotine patch for smoking history  2.  Chronic pain-continue oxycodone.  Patient follows with pain management clinic.     3.  Diabetes mellitus-monitor sugars as needed.  4. Hyponatremia- hypovolemia vs  SIADH from squamous cell carc  5.  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  Patient finished radiation in December 2019.     - poor prognosis, palliative care consulted    Patient's daughter Vikki Ports at bedside bed sleeping.     All the records are reviewed and case discussed with Care Management/Social Workerr. Management plans discussed with the patient, family and they are in agreement.  CODE STATUS: DNR  TOTAL TIME TAKING CARE OF THIS PATIENT: 56mnutes.   POSSIBLE D/C IN ? DAYS, DEPENDING ON CLINICAL CONDITION.   RGladstone LighterM.D on 10/04/2018 at 1:21 PM  Between 7am to 6pm - Pager - [phone]  After 6pm go to [email or web address] - password [email or web address]  Office  [phone] CC: Primary care physician; [facility]

## 2018-10-04 NOTE — Progress Notes (Signed)
PALLIATIVE NOTE:  Patient in bed resting. Denied pain reporting she was tired and did not rest much last night. Some shortness of breath at times. Recently received pain medication. No family currently at the bedside  1215: Went back to patient's bedside to have further discussions and education regarding goals of care and hospice. Patient sitting up on the side of the bed drinking. Currently has no oxygen on and denies shortness of breath. Reports generalized pain. PRN medication recently given. Daughter tearful reporting she is concerned she will get home and not have her pain meds or not be able to get them refilled requiring her to suffer. Patient also concerned about feelings of not being able to breath when she "gets worked up".   Educated and daughter again on the role of hospice and their support. We discussed patient's pain regimen. She reports when she receives her Oxy IR she does gain some relief with some break through pain at times. Discussed increasing medication, allowing fentanyl patch to assist in long-acting pain support, and the use of PRN ativan for anxiety. Patient verbalized understanding and reports she forgot about the use of ativan and states she was unable to sleep last night due to feeling anxious.   Daughter inquiring about IV and antibiotic use. Re-educated daughter and patient on discontinuation of both IV fluids and antibiotics with a goal of comfort. Patient verbalized understanding and expressing remembrance of discussing this on yesterday, however daughter reports she felt she wanted her to continue to receive for her pneumonia until she was discharged. Educated daughter and patient on the use of antibiotics with consideration to patient's current illness, co-morbidities and trajectory. Daughter aware this is not adding any added value to patient's quality of life nor aligned with their expressed goals of comfort. Discussed discontinuation of IV fluids given patient is  tolerating po fluids and also in preparation of discharging home. Patient expressed understanding and requesting to go home today. Support given. Patient also explaining to her daughter the role of hospice using comparison of their previous encounters with their services with her parents. She explains to daughter "this will not help me and it isn't going to make it go away! I don't need IV fluids" Support given. Daughter reports she "just feels guilty but I do understand". Support given. She expresses she would feel more comfortable knowing her mother received antibiotics at least today. Agreed patient would receive her 2pm dose and then it would be discontinued. Daughter verbalized understanding and agreement. Patient agreed also, stating "let's make sure she is comfortable too!"   We again discussed the use of medications and symptom management. Patient is hopeful she can return home today to be with family. Educated her that CSW/CM will follow up on the process of setting up home hospice and needs. She is aware she may potentially discharge today but worst case tomorrow. Patient reporting she is appreciative of the Robinul as it has helped with her secretions and feels this is why she is able to be off of her oxygen. Discussed usage of scopolamine patch for added support.   Daughter sharing a ring she brought her mother expressing she (patient) is the center circle and all of the connecting circles are her children and grandchildren. She is tearful in stating she will continue to be the center of their lives not matter what. Patient verbalized understanding and showing daughter affection. Emotional support provided.   All questions answered. Patient and daughter was able to verbalize the plan and  their understanding and agreement.   -awake, A&O x3, NAD, chronically-ill, thin -RRR, diminished bases, coarse bilateral sounds, tolerating room air  Plan: -Continue with current plan of care (Full  Comfort) -Patient hopeful to discharge home today no later than tomorrow with outpatient hospice. Requesting [facility] @ home hospice services if available given current home health relationship P[facility] Montes aware)  -scopolamine patch for secretions -Oxy IR for pain -will receive last abx dose @ 2pm -Wean off HFNC to regular nasal cannula given she is not currently using  -PMT will continue to support and follow    Total Time: 45 min.   Greater than 50%  of this time was spent counseling and coordinating care related to the above assessment and plan.  Alda Lea, AGPCNP-BC Palliative Medicine Team

## 2018-10-04 NOTE — TOC Progression Note (Signed)
Transition of Care [facility]) - Progression Note    Patient Details  Name: Katie Guzman MRN: [id number] Date of Birth: 31-Jan-1971  [facility]) CM/SW Contact  Shelbie Hutching, RN Phone Number: 10/04/2018, 8:53 AM  Clinical Narrative:    Case management consult for hospice at home and hospice equipment.  Bedside RN reported to [facility] this am that patient and patient's family are interested in getting patient home with hospice this morning.  RNCM went into the room to speak with patient and patient's daughter but both were sleeping and very hard to wake up.  Both patient and daughter request that RNCM return at a later time to discuss hospice.    Expected Discharge Plan: Home w Hospice Care Barriers to Discharge: Family Issues  Expected Discharge Plan and Services Expected Discharge Plan: [place] Determinants of Health (SDOH) Interventions    Readmission Risk Interventions No flowsheet data found.

## 2018-10-04 NOTE — Progress Notes (Signed)
Appreciate palliative care input.  Patient will be discharged home with hospice services tomorrow

## 2018-10-04 NOTE — TOC Progression Note (Signed)
Transition of Care [facility]) - Progression Note    Patient Details  Name: Katie Guzman MRN: [id number] Date of Birth: 1970-09-24  Transition of Care [facility]) CM/SW Contact  Shelbie Hutching, RN Phone Number: 10/04/2018, 2:56 PM  Clinical Narrative:     Patient and family would like to go home with hospice care and they would like hospice with [facility].  [facility] has a hospice but [facility] called [facility].  Referral faxed to [facility]- Fax [phone].  Referral called to Roselyn Meier [phone] with [facility].  Hospice agency will arrange all equipment needed and get orders from patient's primary care,which will be her oncology MD at [facility] Dr. Juluis Rainier.  Patient will need oxygen and suction equipment and possibly a hospital bed, bedside commode.  Patient will not discharge today, maybe tomorrow if equipment can be arranged and delivered.   Expected Discharge Plan: Home w Hospice Care Barriers to Discharge: Other (comment)(hospice referral pending)  Expected Discharge Plan and Services Expected Discharge Plan: [place]   Discharge Planning Services: CM Consult Post Acute Care Choice: Hospice Living arrangements for the past 2 months: Single Family Home                                       Social Determinants of Health (SDOH) Interventions    Readmission Risk Interventions No flowsheet data found.

## 2018-10-05 DIAGNOSIS — R0603 Acute respiratory distress: Secondary | ICD-10-CM

## 2018-10-05 MED ORDER — MORPHINE BOLUS VIA INFUSION
2.0000 mg | INTRAVENOUS | Status: DC | PRN
  Administered 2018-10-05: 2 mg via INTRAVENOUS
  Administered 2018-10-05 (×2): 4 mg via INTRAVENOUS
  Filled 2018-10-05: qty 4

## 2018-10-05 MED ORDER — MORPHINE 100MG IN NS 100ML (1MG/ML) PREMIX INFUSION
2.0000 mg/h | INTRAVENOUS | Status: DC
  Administered 2018-10-05 – 2018-10-06 (×2): 2 mg/h via INTRAVENOUS
  Administered 2018-10-06 (×2): 4 mg/h via INTRAVENOUS
  Administered 2018-10-06 – 2018-10-10 (×5): 5 mg/h via INTRAVENOUS
  Filled 2018-10-05 (×6): qty 100

## 2018-10-05 MED ORDER — GLYCOPYRROLATE 0.2 MG/ML IJ SOLN
0.4000 mg | Freq: Four times a day (QID) | INTRAMUSCULAR | Status: DC
  Administered 2018-10-05 – 2018-10-10 (×21): 0.4 mg via INTRAVENOUS
  Filled 2018-10-05 (×21): qty 2

## 2018-10-05 MED ORDER — MORPHINE SULFATE (PF) 2 MG/ML IV SOLN: 2.0000 mg | INTRAVENOUS | Status: DC | PRN

## 2018-10-05 MED ORDER — LORAZEPAM 2 MG/ML IJ SOLN
1.0000 mg | Freq: Four times a day (QID) | INTRAMUSCULAR | Status: DC
  Administered 2018-10-05 – 2018-10-10 (×20): 1 mg via INTRAVENOUS
  Filled 2018-10-05 (×20): qty 1

## 2018-10-05 MED ORDER — LORAZEPAM 2 MG/ML IJ SOLN
1.0000 mg | INTRAMUSCULAR | Status: DC | PRN
  Administered 2018-10-05: 12:00:00 2 mg via INTRAVENOUS
  Filled 2018-10-05: qty 1

## 2018-10-05 MED ORDER — LORAZEPAM 2 MG/ML IJ SOLN
1.0000 mg | INTRAMUSCULAR | Status: AC
  Administered 2018-10-05: 1 mg via INTRAVENOUS

## 2018-10-05 MED ORDER — MORPHINE SULFATE (PF) 2 MG/ML IV SOLN
2.0000 mg | INTRAVENOUS | Status: DC | PRN
  Administered 2018-10-05: 2 mg via INTRAVENOUS
  Filled 2018-10-05: qty 1

## 2018-10-05 NOTE — Plan of Care (Signed)
Comfort Care pt now on morphine drip.  Family around the bed. Have given 3 morphine boluses totalling 10 mg.

## 2018-10-05 NOTE — TOC Progression Note (Signed)
Transition of Care [facility]) - Progression Note    Patient Details  Name: Katie Guzman MRN: [id number] Date of Birth: October 11, 1970  Transition of Care [facility]) CM/SW Contact  Shelbie Hutching, RN Phone Number: 10/05/2018, 10:26 AM  Clinical Narrative:    Patient very uncomfortable this morning in visual respiratory distress.  Patient started on a morphine drip.  Family notified and at the bedside.  Chaplin also at the bedside providing spiritual support.  [facility] updated and plan for home with hospice suspended at this time.     Expected Discharge Plan: Home w Hospice Care Barriers to Discharge: Other (comment)(hospice referral pending)  Expected Discharge Plan and Services Expected Discharge Plan: [place]   Discharge Planning Services: CM Consult Post Acute Care Choice: Hospice Living arrangements for the past 2 months: Single Family Home                                       Social Determinants of Health (SDOH) Interventions    Readmission Risk Interventions No flowsheet data found.

## 2018-10-05 NOTE — Plan of Care (Signed)
Pt back in respiratory distress. Gave morphine and paged palliative to come.  Thought she needed to be put on a morphine drip.  Lexine Baton came immediately and put orders in.  Pt was given haldol - showing signs of terminal restlessness.  MOrphine drip orders put in.

## 2018-10-05 NOTE — Progress Notes (Signed)
Pharmacy Antibiotic Note  Katie Guzman is a 48 y.o. female admitted on 09/17/2018 with pneumonia.  Pharmacy has been consulted for cefepime dosing.  Plan: Cefepime 2gm q8h - continuing per family request - dc'ed vancomycin to minimize lab draws - would normally draw Scr today for Cefepime clinical monitoring but limiting sticks per comfort care measures and planned pt discharge to hospice today  Height: 5\' 2"  (157.5 cm) Weight: 114 lb 6.7 oz (51.9 kg) IBW/kg (Calculated) : 50.1  Recent Labs  Lab 10/09/2018 1041  WBC 18.0*  CREATININE 0.45    Estimated Creatinine Clearance: 67.3 mL/min (by C-G formula based on SCr of 0.45 mg/dL).    Allergies  Allergen Reactions  . Flexeril [Cyclobenzaprine] Hives and Nausea And Vomiting  . Hydrocodone Hives  . Other Hives  . Darvocet [Propoxyphene N-Acetaminophen] Hives and Nausea And Vomiting  . Fish-Derived Products Other (See Comments)    Patient does not like to eat fish. Not allergic but prefers not to.  . Propofol Nausea And Vomiting  . Toradol [Ketorolac Tromethamine] Nausea And Vomiting  . Vicodin [Hydrocodone-Acetaminophen] Hives and Nausea And Vomiting  . Ketorolac Nausea And Vomiting  . Tramadol Nausea And Vomiting    Vomiting only    Antimicrobials this admission: vancomycin 08/23 >> 8/25 zosyn 08/23 >> 08/23 Cefepime 08/23>>  Microbiology results: 08/23 BCx: NG x 2 days 08/23 Covid: NEG  Thank you for allowing pharmacy to be a part of this patient's care.  Lu Duffel, PharmD, BCPS Clinical Pharmacist 10/05/2018 8:15 AM

## 2018-10-05 NOTE — Progress Notes (Signed)
Was requested by provider and RN taking care of patient to talk about family concerns about visitation. When I arrived to room, 4 people were present in the room. 2 sitting in bed with patient.Family member immediately stated, there were a lot of family members and grandchildren that wanted to see her, and if it were one of the staff members family, the rules wouldn't apply. Also stated none of [place] have covid, you test [place] for it up front. Explained was temp screening. Assured her hospital staff have to comply with visiting policy. I explained we were already bending the rules by letting 5 people in the room, and allowing people to come in and go out.I later okayed a 5th relative to visit. Family member looked up as if talking to God and said all this covid is a bunch of crap, and we don't know what it's like to have a dying family member. She asked if grandchildren could be brought in. I told her no one under 18 could visit. She then said I don't give a damn about the covid. I also explained that we did, as one person with covid can make multiple people sick. The RN asked about face timing with the grand children. When she asked me how we do it, explained we used hospital supplied I-phones. It did not seem family was satisfied about the resolution of our discussion. They asked what we would do if they just brought children in. I explained they would not be allowed to enter the building and if the issue was pushed, security would have to be involved.

## 2018-10-05 NOTE — Progress Notes (Signed)
Family requested to speak with a provider in regards to patient's condition and orders. Sister and daughter's at bedside visibly upset. The  oldest daughter and sister state that the patient stated prior to now that she "wanted everything done for one time and if nothing happens then let her go, she does not want to be on a ventilator but wants cpr". This nurse attempted to talk with family about patient's comfort care orders and what that involves. Family states that they did not give authorization for patient to be a DNR and to have comfort care orders. They are also stating that  they spoke with hospice and was told that hospice did not cancel their service. They are questioning why she was not discharged today.  At this time they are requesting IV fluids to be restarted and a change in her code status. Gardiner Barefoot, FNP, night provider informed about their stated concerns and will come to the room to speak with the family.   2050Levada Dy Seals in the room speaking with the family about their concerns. Per Levada Dy,  family requested for Morphine drip to be decreased. Rate decreased to 1mg /hr.  Later on, the family requested that the rate be increased back to 4mg /hr. Rate increased to 4mg /hr.  At this time the family appears to be calmer.  Instructed family to call for any assistance or concerns. Will continue to monitor and provide comfort through out the shift.

## 2018-10-05 NOTE — Progress Notes (Signed)
Daily Progress Note   Patient Name: Katie Guzman       Date: 10/05/2018 DOB: Aug 22, 1970  Age: 48 y.o. MRN#: [id number] Attending Physician: Katie Mango, MD Primary Care Physician: Katie Guzman Family Admit Date: 09/10/2018  Reason for Consultation/Follow-up: Establishing goals of care  Subjective: Upon arriving to bedside patient in severe respiratory distress, sitting on the side of the bed leaning over bedside table, tachypneic (44), tachycardic (122), agitated, pushing staff hands away when trying to apply oxygen for comfort. Audible stridor. Katie Kidney, RN at bedside. Orders given to administer PRN and one time dose medications for comfort. No family at the bedside. Daughter recently went outside for some alone time. Attempting to call daughter back to room to discuss further needs to ensure patient is comfortable.   Educating patient on the use of oxygen to provide comfort. She continues to refuse and looks at me stating "no I don't want it, just let me go, it's time. Please get my daughters." Continued attempts to get in touch with daughters. Patient finally agreed to allow oxygen to be applied. Once somewhat more calm and breathing more relaxed assisted with getting patient back into bed. Daughter Katie Guzman answered my calls and updates provided, she expressed she was on the phone with Katie Guzman who was outside of the hospital.   Katie Guzman at bedside. Emotional and hugging on her mother. Daughter is yelling "please don't leave me mom" emotional support provided. Katie Guzman on facetime and in route to hospital. Discussed with daughters given patients severe respiratory distress and multiple doses of morphine needed recommend continuous morphine drip for comfort. Discussed previous Katie Guzman  discussion with their mother (daughters also present) and patient's wishes not to suffer. Explained patient will not be able to discharge home today and may pass away here in the hospital, educating family on patient's instability. Daughter Katie Guzman verbalized understanding sharing "this is what I don't want to happen at home because our anxiety couldn't handle it!" Support given and daughters mutually agreed on morphine drip for comfort.   Morphine drip was quickly initiated and educated daughters (both now at the bedside) on use and boluses as needed with a goal to achieve comfort. They verbalized understanding. Katie Guzman upset and hyperventilating. Water and therapeutic discussion provided. Once she calmed down she got in bed next to her mom and expressing  her love. Katie Guzman discussing with Katie Guzman how she saw her mom and appreciation she appeared more relax. Katie Guzman expressing she was not ready to let her go and Katie Guzman comforting her stating "mom is tired, we know that and she has told us that!" Therapeutic listening and support provided.   Patient much more relaxed, with some occasional episodes of accessory muscle. Chaplain support at the bedside. Daughters calm and verbalizing their appreciation of support and rapid response to getting their mother comfortable. Katie Guzman on the phone facetiming with their aunt Katie Guzman who is watching their kids. All of the grandkids are on the video chat, Katie Guzman and Katie Guzman expressing to their children to tell Katie Guzman you love her.   Daughters comfortable and again appreciative. All questions answered.   1200: Received a call from Katie Guzman, Katie Guzman that family was upset and requesting I come in to discuss with them about morphine bolus and why patient cannot come home today. Upon arrival several new family members at the bedside. Katie Guzman (cousin), patient's 2 sisters, and patient's nephew. Introduced myself to family and discussed patient's current plan of care in detail. We  discussed at length patient's prognosis, the use of morphine infusion for comfort, signs of dying along with expectations. Patient is guppy breathing, use of accessory muscles. Daughters tearful stating "we don't like to see her like this" maybe she needs to be on life support. Discussed mechanical ventilation and patient's poor prognosis despite aggressive treatments. Family verbalized understanding that aggressive measures will not add any additional quality of life and patient will continue to decline and die or remain on ventilator long-term. Also discussed difficulty with intubation given her supraglottic cancer and necrosis which would add further trauma and distress for patient. All family verbalized understanding. Sisters were mainly asking all the questions. Sister Katie Guzman) stating "we have been through this with our mom who had pancreatic cancer and died. So you all just going to give her morphine to slow her breathing and die." Educated sister/family on the use of morphine in patient's current condition and how it promotes comfort for air hunger and pain relief. Assured her that it does not hasten death but allow the process to be less stressful on the body. She verbalized understanding and began crying and holding on to patient. She is looking at patient loudly stating "Katie Guzman get up, get up now!" She then states she needs some water her mouth is dry. Educated family on risk of aspiration and that patient was not alert enough to swallow. Swabs made available and mouth care was provided for comfort. Educated family on swabbing patient's mouth and that staff would also assist with oral care for comfort. Daughters verbalized understanding. Daughters both next to me holding my hand and expressing appreciation of support. I again discussed how patient is currently struggling with her breathing and why it is at the best interest of the patient to allow bolus infusions by the nurse to promote comfort in  patient and eliminate as much distress as possible. Family tearful stating "they are afraid she can feel the distress and do not want her to suffer!" Support given. Daughters Katie Guzman) stated "please go tell Katie Guzman we are sorry and come do whatever she needs to do to make sure my mom is comfortable!" Both daughters apologizing for not allowing this earlier as they just needed to see and talk to me for reassurance. I again reminded them of the discussions we had together with their mom and her expressed wishes. We spent time laughing  about our discussions and how patient shared she was going to find her a good man in heaven. Family all in agreement with current plan and again requesting to administer PRN medications and boluses as nursing staff felt was necessary. Katie Kidney, RN updated and preparing to administer IV infusion bolus.   Length of Stay: 3  Current Medications: Scheduled Meds:  . fentaNYL  1 patch Transdermal Q72H  . glycopyrrolate  0.4 mg Intravenous Q6H  . LORazepam  1 mg Intravenous Q6H  . nicotine  21 mg Transdermal Daily  . scopolamine  1 patch Transdermal Q72H    Continuous Infusions: . morphine 3 mg/hr (10/05/18 1055)    PRN Meds: haloperidol lactate, LORazepam, morphine, ondansetron **OR** ondansetron (ZOFRAN) IV  Physical Exam       -agitated, severe respiratory distress, orthopnea, warm to touch -tachycardic, tachypneic, stridor -awake, able to follow commands, and speak in small phrases    Vital Signs: BP (!) 158/75 (BP Location: Right Arm)   Pulse (!) 107   Temp 98.6 F (37 C) (Oral)   Resp 18   Ht 5\' 2"  (1.575 m)   Wt 51.9 kg   SpO2 96%   BMI 20.93 kg/m  SpO2: SpO2: 96 % O2 Device: O2 Device: Nasal Cannula O2 Flow Rate: O2 Flow Rate (L/min): 4 L/min  Intake/output summary:   Intake/Output Summary (Last 24 hours) at 10/05/2018 1138 Last data filed at 10/05/2018 [id number] Gross per 24 hour  Intake 533.19 ml  Output 2800 ml  Net -2266.81 ml   LBM: Last BM Date:  10/03/18 Baseline Weight: Weight: 51.9 kg Most recent weight: Weight: 51.9 kg       Palliative Assessment/Data: COMFORT       Patient Active Problem List   Diagnosis Date Noted  . Acute respiratory failure ([place]) 09/18/2018  . Toxic encephalopathy 09/09/2018  . Acute respiratory failure with hypercapnia ([place])   . Aspiration into airway   . Polysubstance abuse ([place])   . Osteomyelitis of ankle or foot, acute, right ([place])   . Cellulitis of right foot 08/18/2018  . Diabetic ulcer of right foot associated with diabetes mellitus due to underlying condition ([place]) 07/29/2018  . Diabetic foot infection ([place]) 03/27/2017  . Skin ulcer ([place]) 08/23/2016  . Diabetic foot ulcer ([place]) 08/23/2016  . Diabetic ulcer of toe of left foot associated with type 2 diabetes mellitus ([place])   . 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 Tenosynovitis of foot 04/16/2015  . Hypokalemia 04/16/2015  . Cellulitis 04/15/2015  . Diabetes mellitus ([place]) 04/15/2015  . Pain syndrome, chronic 04/15/2015  . COPD (chronic obstructive pulmonary disease) ([place]) 04/15/2015  . Diabetic neuropathy ([place]) 04/15/2015  . IDDM (insulin dependent diabetes mellitus) ([place])   . COPD exacerbation ([place]) 10/19/2014    Palliative Care Assessment & Plan   Patient Profile: Palliative Care consult requested for goals of care in this 48 y.o. 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 [facility] worsening shortness of breath. She was recently hospitalized (2 ICU admissions) for similar concerns with one admission requiring intubation. On arrival to ED patient required BiPAP for respiratory support. Chest x-ray showed new left-sided infiltrates. She was started on IV Vancomycin and  cefepime post blood cultures. Since admission patient continues to require increased oxygen use.   Recommendations/Plan:  DNR/DNI  Full Comfort Care  CM notified patient will be unable to transport home today with hospice given her instability and symptom burden  Family educated on dying process, change in patient's status, symptom management, and aware of anticipated hospital death vs  Home w/hospice  Morphine drip with bolus for pain/air hunger, RN may titrate for comfort  Ativan/Haldol PRN for agitation/anxiety  Robinul/Scopalomine for excessive secretions  Oral care for comfort  PMT will continue to follow and support  Goals of Care and Additional Recommendations:  Limitations on Scope of Treatment: Full Comfort Care  Code Status:    Code Status Orders  (From admission, onward)         Start     Ordered   09/27/2018 1429  Do not attempt resuscitation (DNR)  Continuous    Question Answer Comment  In the event of cardiac or respiratory ARREST Do not call a "code blue"   In the event of cardiac or respiratory ARREST Do not perform Intubation, CPR, defibrillation or ACLS   In the event of cardiac or respiratory ARREST Use medication by any route, position, wound care, and other measures to relive pain and suffering. May use oxygen, suction and manual treatment of airway obstruction as needed for comfort.      10/01/2018 1428        Code Status History    Date Active Date Inactive Code Status Order ID Comments User Context   09/30/2018 1257 10/05/2018 1428 Full Code [id number]  Gladstone Lighter, MD Inpatient   09/18/2018 0954 09/22/2018 2044 Full Code [id number]  Katie Mango, MD Inpatient   09/09/2018 0201 09/09/2018 1712 Full Code [id number]  Arnell Asal, NP ED   08/18/2018 1815 08/25/2018 1635 Full Code [id number]  Lang Snow, NP Inpatient   07/29/2018 0158 07/31/2018 1311 Full Code [id number]  Mayer Camel, NP ED   03/28/2017 0206 03/28/2017 1338 Full Code  [id number]  Cristy Folks, MD Inpatient   08/23/2016 0507 08/24/2016 0530 Full Code [id number]  Jani Gravel, MD Inpatient   04/15/2015 1411 04/17/2015 1736 Full Code [id number]  Rondel Jumbo, PA-C ED   10/19/2014 1641 10/23/2014 2140 Full Code [id number]  Henreitta Leber, MD Inpatient   Advance Care Planning Activity      Prognosis:   Hours - Days  Discharge Planning:  Anticipated Hospital Death  Care plan was discussed with bedside RN, Pryor Montes, CM.   Thank you for allowing the Palliative Medicine Team to assist in the care of this patient.  Time: [id number] Time: 1145-1230 Total Time: 120 min.   Greater than 50%  of this time was spent counseling and coordinating care related to the above assessment and plan.  Alda Lea, AGPCNP-BC Palliative Medicine Team  Phone: [phone] Fax: [phone] Pager: [phone] Amion: Bjorn Pippin    Please contact Palliative Medicine Team phone at [phone] for questions and concerns.

## 2018-10-05 NOTE — Progress Notes (Signed)
[facility] NAME: Katie Guzman    MR#:  [id number]  DATE OF BIRTH:  06-07-1970  SUBJECTIVE:  CHIEF COMPLAINT:   Chief Complaint  Patient presents with  . Respiratory Distress   -Patient with laryngeal carcinoma, COPD, ongoing smoking, home oxygen presents with acute respiratory distress.  Was on BiPAP in the ICU.  Transition to hospice care and transfer to floor. -Patient is is very short of breath, is gasping during my examination On morphine drip 3 daughters at bedside  REVIEW OF SYSTEMS:  Review of Systems  Unable to perform ROS: Acuity of condition  Constitutional: Positive for malaise/fatigue. Negative for chills and fever.  HENT: Negative for sore throat.   Eyes: Negative for blurred vision and double vision.  Respiratory: Positive for shortness of breath. Negative for cough and wheezing.   Cardiovascular: Negative for chest pain and palpitations.  Gastrointestinal: Negative for abdominal pain, constipation, diarrhea, nausea and vomiting.  Genitourinary: Negative for dysuria.  Musculoskeletal: Negative for back pain and myalgias.  Neurological: Negative for dizziness, focal weakness, seizures, weakness and headaches.  Psychiatric/Behavioral: Negative for depression.    DRUG ALLERGIES:   Allergies  Allergen Reactions  . Flexeril [Cyclobenzaprine] Hives and Nausea And Vomiting  . Hydrocodone Hives  . Other Hives  . Darvocet [Propoxyphene N-Acetaminophen] Hives and Nausea And Vomiting  . Fish-Derived Products Other (See Comments)    Patient does not like to eat fish. Not allergic but prefers not to.  . Propofol Nausea And Vomiting  . Toradol [Ketorolac Tromethamine] Nausea And Vomiting  . Vicodin [Hydrocodone-Acetaminophen] Hives and Nausea And Vomiting  . Ketorolac Nausea And Vomiting  . Tramadol Nausea And Vomiting    Vomiting only    VITALS:  Blood pressure (!) 158/75, pulse (!) 107, temperature 98.6 F  (37 C), temperature source Oral, resp. rate 18, height '5\' 2"'  (1.575 m), weight 51.9 kg, SpO2 96 %.  PHYSICAL EXAMINATION:  Physical Exam  GENERAL:  48 y.o.-year-old patient lying in the bed, chronically ill-appearing.  Complaints of dyspnea. EYES: Pupils equal, round, reactive to light and accommodation. No scleral icterus. Extraocular muscles intact.  HEENT: Head atraumatic, normocephalic. Oropharynx and nasopharynx clear.  NECK:  Supple, no jugular venous distention. No thyroid enlargement, no tenderness.  LUNGS: Coarse, rhonchorous breath sounds bilaterally, decreased at the bases.  audible wheezing. No expiratory wheezing, rales  or crepitation. positive use of accessory muscles of respiration.  CARDIOVASCULAR: S1, S2 normal. No murmurs, rubs, or gallops.  ABDOMEN: Soft, nontender, nondistended. Bowel sounds present.  EXTREMITIES: No pedal edema, cyanosis, or clubbing.  NEUROLOGIC: Patient is alert, cranial nerves seems to be intact.  Able to move all extremities in bed.  Global weakness noted.  Sensation is intact.  Gait is not tested...  PSYCHIATRIC: The patient is  alert and oriented x3 SKIN: No obvious rash, lesion, or ulcer.    LABORATORY PANEL:   CBC Recent Labs  Lab 09/13/2018 1041  WBC 18.0*  HGB 11.1*  HCT 35.7*  PLT 485*   ------------------------------------------------------------------------------------------------------------------  Chemistries  Recent Labs  Lab 09/18/2018 1041  NA 127*  K 4.8  CL 86*  CO2 32  GLUCOSE 223*  BUN 13  CREATININE 0.45  CALCIUM 8.8*  AST 16  ALT 14  ALKPHOS 108  BILITOT 0.5   ------------------------------------------------------------------------------------------------------------------  Cardiac Enzymes No results for input(s): TROPONINI in the last 168 hours. ------------------------------------------------------------------------------------------------------------------  RADIOLOGY:  No results found.  EKG:    Orders placed  or performed during the hospital encounter of 09/17/2018  . ED EKG  . ED EKG  . EKG 12-Lead  . EKG 12-Lead    ASSESSMENT AND PLAN:   [name]  is a 48 y.o. 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  Patient got admitted to ICU for acute hypoxic respiratory failure secondary to COPD and pneumonia.  Was on BiPAP, was also noted to be extremely delirious.  Patient has had significant decline in her physical health since 2019/since her cancer was diagnosed. Severe odynophagia from radiation, 3 ICU hospital admissions within the last 3 weeks.  Most recent laryngoscopy performed at [facility] showing significant necrosis of the epiglottis and the vocal cords cartilage.  ICU attending had discussion with patient's daughters and they have transitioned patient to comfort care. She was placed on high flow nasal cannula.  Patient is more alert and is transition to floor status.  Palliative care has met with the family.  Plan is possible discharge home with hospice services. -However patient's daughter requested to start antibiotics last night and also requested for IV fluids.  Palliative care to discuss goals of hospice care at this time.  Below are her current diagnosis:  1.  Acute chronic hypoxic respiratory failure-secondary to COPD exacerbation and HCAP -Patient has had 2 ICU hospitalizations within the last 3 weeks.  1 of these hospitalizations, she was even intubated. -Vancomycin discontinued.  Currently  family is considering hospice care at home -Morphine drip titrate as needed to keep her comfortable - 2.  Chronic pain-continue oxycodone.  Patient follows with pain management clinic.     3.  Diabetes mellitus-monitor  sugars as needed.  4. Hyponatremia- hypovolemia vs SIADH from squamous cell carc  5.  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  Patient finished radiation in December 2019.     -Very poor prognosis, palliative care has seen the patient and plan is to discharge patient home with hospice care.  Patient is very very unsteady today Using accessory muscles to breathe, unsteady for discharge Plan is to titrate the morphine drip to keep the patient comfortable  Patient's daughter Vikki Ports and 2 other daughters at bedside      All the records are reviewed and case discussed with Care Management/Social Workerr. Management plans discussed with the patient's 3 daughters at bedside and they are in agreement.  CODE STATUS: DNR  TOTAL TIME TAKING CARE OF THIS PATIENT: 19mnutes.   POSSIBLE D/C IN ? DAYS, DEPENDING ON CLINICAL CONDITION.   ANicholes MangoM.D on 10/05/2018 at 3:13 PM  Between 7am to 6pm - Pager - [phone] After 6pm go to [email or web address] - password [email or web address]  Office  [phone] CC: Primary care physician; [facility], [facility]

## 2018-10-05 NOTE — Plan of Care (Signed)
At hand off, pt was sitting up in bed w/out her O2 and had her gown off.  Breathing was very labored.  Believed she would benefit from ativan - night nurse administered for me.  Went in to reevaluate and pt was resting comfortably; breath less labored.  Had talk with daughter who is exhibiting stress and grief.  Will ask chaplain to talk to her and will give her Alba.  Explained this was a natural process and we will keep her mom comfortable.  Also stressed she needed to take care of herself so she could take care of her mom.  Encouraged her to take a break, which she did.

## 2018-10-06 LAB — GLUCOSE, CAPILLARY
Glucose-Capillary: 140 mg/dL — ABNORMAL HIGH (ref 70–99)
Glucose-Capillary: 118 mg/dL — ABNORMAL HIGH (ref 70–99)

## 2018-10-06 MED ORDER — SODIUM CHLORIDE 0.9 % IV SOLN
INTRAVENOUS | Status: DC
  Administered 2018-10-06 – 2018-10-08 (×3): via INTRAVENOUS

## 2018-10-06 MED ORDER — ACETAMINOPHEN 650 MG RE SUPP
650.0000 mg | RECTAL | Status: DC | PRN
  Administered 2018-10-06: 09:00:00 650 mg via RECTAL
  Filled 2018-10-06: qty 1

## 2018-10-06 NOTE — Progress Notes (Signed)
PALLIATIVE NOTE:  Patient in bed. Appears much more comfortable and without respiratory distress compared to yesterday. No audible wheezing/stridor. Some mouth breathing. Oxygen in place at 4L/[place]. Continues on morphine drip. Family requested IV fluids to be restarted over night.   Daughters ([place] and [place]) at the bedside. Updates provided. Patient's sister Freida Busman later came in during discussion. Daughters expressed emotional roller coaster and dealing with grief in their own way. Candy brought roses and a sympathy card from the neighbors. Daughters sharing how much their mother is loved in the community and they hate COVID because she deserved everyone who wanted to be at her funeral to be able to be there. Support given.   Discussed in detail patients condition and appreciation for her not being in a severe state of distress as observed yesterday. Daughters verbalized understanding and agreement stating "yesterday was horrible!" [place] shared her concerns with patient being dehydrated. We discussed EOL process and patient does not desire nutrition and liquids at this state. She verbalized understanding. Educated on continued mouth care. Family requesting to continue with IV fluids for their comfort. Again educated this is adding no value to patient's quality of life and overall condition, however as requested fluids will continue to eliminate distress. Discussed morphine drip and use of boluses for comfort when patient shows signs of discomfort. Family verbalized understanding and agreement.   Family verbalized concerns regarding information received. They are upset that they have been asked what their plans were in regards to patient going home, while already having difficulty accepting their mother is dying. They shared they would love to have her home so her grandchildren could be with her but also want what is best and their concerns with them being able to provide the care she would need alone.  Therapeutic listening and support given. They expressed frustration in conflicting information of being told that the best place for her in her current state is here and given she is on a morphine drip she could not go home compared to someone asking if what their plans are in regards to her going home. [facility] also contacted them this morning notifying them that patient is best cared for in the hospital or at a residential hospice facility given her symptom management needs. Again educated family on patient's prognosis, instability to be cared for in the home due to symptom burden, and visitation policy in consideration to family and small children. All verbalized agreement and awareness.   Family appreciative of updates, clarity, recommendations, and care being given. All questions and concerns answered. They are in agreement with current plan of care with an anticipation of a hospital death most likely.   Assessment:  -unresponsive, chronically-ill, thin -RRR, diminished bases, coarse, 3L/  Plan: -DNR -Continue current plan of care (full comfort) -Family requesting to continue with IV fluids. This is not ideal in a comfort patient however, to eliminate distress of family and honor request may continue at [facility]. Rate currently @50 , I would titrate down to a max rate of 35ml -Family educated and in agreement patient will not be returning home in her current state for safety and symptom management. Aware to anticipate hospital death.  -PMT will continue to support and follow.   Total Time: 35 min.   Greater than 50%  of this time was spent counseling and coordinating care related to the above assessment and plan.  Alda Lea, AGPCNP-BC Palliative Medicine Team

## 2018-10-06 NOTE — Progress Notes (Signed)
Family is requesting patient receive IV fluids due to pt. possibly being dehydrated. Encouraged family to perform oral care as needed. New order received to start normal saline at [facility]. See MAR.

## 2018-10-06 NOTE — TOC Progression Note (Signed)
Transition of Care [facility]) - Progression Note    Patient Details  Name: Katie Guzman MRN: [id number] Date of Birth: 24-Jun-1970  Transition of Care [facility]) CM/SW Contact  Shelbie Hutching, RN Phone Number: 10/06/2018, 10:53 AM  Clinical Narrative:    Family has requested that [facility] not come back into the room.  RNCM will communicate via bedside RN if any needs arise.  [place] has been in communication with family about home with hospice but they are unable to meet the patient's pain management needs at this time.    Expected Discharge Plan: Home w Hospice Care Barriers to Discharge: Other (comment)(hospice referral pending)  Expected Discharge Plan and Services Expected Discharge Plan: [place]   Discharge Planning Services: CM Consult Post Acute Care Choice: Hospice Living arrangements for the past 2 months: Single Family Home                                       Social Determinants of Health (SDOH) Interventions    Readmission Risk Interventions No flowsheet data found.

## 2018-10-06 NOTE — Progress Notes (Signed)
[place] at [place] NAME: Katie Guzman    MR#:  [id number]  DATE OF BIRTH:  March 08, 1970  SUBJECTIVE:  CHIEF COMPLAINT:   Chief Complaint  Patient presents with  . Respiratory Distress   -Patient with laryngeal carcinoma, COPD, ongoing smoking, home oxygen presents with acute respiratory distress.  Was on BiPAP in the ICU.  Transition to hospice care and transfer to floor. -Patient is comfortable today on increased morphine drip 2 daughters at bedside  REVIEW OF SYSTEMS:  Review of Systems  Unable to perform ROS: Acuity of condition  Constitutional: Positive for malaise/fatigue. Negative for chills and fever.  HENT: Negative for sore throat.   Eyes: Negative for blurred vision and double vision.  Respiratory: Positive for shortness of breath. Negative for cough and wheezing.   Cardiovascular: Negative for chest pain and palpitations.  Gastrointestinal: Negative for abdominal pain, constipation, diarrhea, nausea and vomiting.  Genitourinary: Negative for dysuria.  Musculoskeletal: Negative for back pain and myalgias.  Neurological: Negative for dizziness, focal weakness, seizures, weakness and headaches.  Psychiatric/Behavioral: Negative for depression.    DRUG ALLERGIES:   Allergies  Allergen Reactions  . Flexeril [Cyclobenzaprine] Hives and Nausea And Vomiting  . Hydrocodone Hives  . Other Hives  . Darvocet [Propoxyphene N-Acetaminophen] Hives and Nausea And Vomiting  . Fish-Derived Products Other (See Comments)    Patient does not like to eat fish. Not allergic but prefers not to.  . Propofol Nausea And Vomiting  . Toradol [Ketorolac Tromethamine] Nausea And Vomiting  . Vicodin [Hydrocodone-Acetaminophen] Hives and Nausea And Vomiting  . Ketorolac Nausea And Vomiting  . Tramadol Nausea And Vomiting    Vomiting only    VITALS:  Blood pressure 115/66, pulse 95, temperature 98.6 F (37 C), temperature source Oral, resp.  rate 18, height '5\' 2"'  (1.575 m), weight 51.9 kg, SpO2 96 %.  PHYSICAL EXAMINATION:  Physical Exam  GENERAL:  48 y.o.-year-old patient lying in the bed, chronically ill-appearing.  Complaints of dyspnea. EYES: Pupils equal, round, reactive to light and accommodation. No scleral icterus. Extraocular muscles intact.  HEENT: Head atraumatic, normocephalic. Oropharynx and nasopharynx clear.  NECK:  Supple, no jugular venous distention. No thyroid enlargement, no tenderness.  LUNGS: Coarse, rhonchorous breath sounds bilaterally, decreased at the bases.  audible wheezing. No expiratory wheezing, rales  or crepitation. positive use of accessory muscles of respiration.  CARDIOVASCULAR: S1, S2 normal. No murmurs, rubs, or gallops.  ABDOMEN: Soft, nontender, nondistended. Bowel sounds present.  EXTREMITIES: No pedal edema, cyanosis, or clubbing.  NEUROLOGIC: Patient is alert, cranial nerves seems to be intact.  Able to move all extremities in bed.  Global weakness noted.  Sensation is intact.  Gait is not tested...  PSYCHIATRIC: The patient is  alert and oriented x3 SKIN: No obvious rash, lesion, or ulcer.    LABORATORY PANEL:   CBC Recent Labs  Lab 09/24/2018 1041  WBC 18.0*  HGB 11.1*  HCT 35.7*  PLT 485*   ------------------------------------------------------------------------------------------------------------------  Chemistries  Recent Labs  Lab 10/06/2018 1041  NA 127*  K 4.8  CL 86*  CO2 32  GLUCOSE 223*  BUN 13  CREATININE 0.45  CALCIUM 8.8*  AST 16  ALT 14  ALKPHOS 108  BILITOT 0.5   ------------------------------------------------------------------------------------------------------------------  Cardiac Enzymes No results for input(s): TROPONINI in the last 168 hours. ------------------------------------------------------------------------------------------------------------------  RADIOLOGY:  No results found.  EKG:   Orders placed or performed during the  hospital encounter of 10/06/2018  .  ED EKG  . ED EKG  . EKG 12-Lead  . EKG 12-Lead    ASSESSMENT AND PLAN:   Katie Guzman  is a 48 y.o. 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  Patient got admitted to ICU for acute hypoxic respiratory failure secondary to COPD and pneumonia.  Was on BiPAP, was also noted to be extremely delirious.  Patient has had significant decline in her physical health since 2019/since her cancer was diagnosed. Severe odynophagia from radiation, 3 ICU hospital admissions within the last 3 weeks.  Most recent laryngoscopy performed at [facility] showing significant necrosis of the epiglottis and the vocal cords cartilage.  ICU attending had discussion with patient's daughters and they have transitioned patient to comfort care. She was placed on high flow nasal cannula.  Patient is more alert and is transition to floor status.  Palliative care has met with the family.  Plan is possible discharge home with hospice services. -However patient's daughter requested to start antibiotics last night and also requested for IV fluids.  Palliative care to discuss goals of hospice care at this time.  Below are her current diagnosis:  1.  Acute chronic hypoxic respiratory failure-secondary to COPD exacerbation and HCAP -Patient has had 2 ICU hospitalizations within the last 3 weeks.  1 of these hospitalizations, she was even intubated. -Vancomycin discontinued.  Currently  family is considering hospice care at home -Morphine drip titrate as needed to keep her comfortable - 2.  Chronic pain-continue oxycodone.  Patient follows with pain management clinic.     3.  Diabetes mellitus-monitor sugars as needed.  4. Hyponatremia-  hypovolemia vs SIADH from squamous cell carc  5.  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  Patient finished radiation in December 2019.     -Very poor prognosis, palliative care has seen the patient and plan is to discharge patient home with hospice care.  Patient is very very unsteady today Using accessory muscles to breathe, unsteady for discharge Plan is to titrate the morphine drip to keep the patient comfortable Anticipating hospital death  Patient's daughter Vikki Ports and other daughter at bedside      All the records are reviewed and case discussed with Care Management/Social Workerr. Management plans discussed with the patient's 2 daughters at bedside and they are in agreement.  CODE STATUS: DNR  TOTAL TIME TAKING CARE OF THIS PATIENT: 77mnutes.   POSSIBLE D/C IN ? DAYS, DEPENDING ON CLINICAL CONDITION.   ANicholes MangoM.D on 10/06/2018 at 2:05 PM  Between 7am to 6pm - Pager - [phone] After 6pm go to [email or web address] - password [email or web address]  Office  [phone] CC: Primary care physician; [facility], [facility]

## 2018-10-07 LAB — CULTURE, BLOOD (ROUTINE X 2)
Culture: NO GROWTH
Culture: NO GROWTH
Special Requests: ADEQUATE
Special Requests: ADEQUATE

## 2018-10-07 NOTE — Progress Notes (Signed)
   10/06/18 2130  Clinical Encounter Type  Visited With Patient and family together  Visit Type Follow-up  Referral From Nurse  Consult/Referral To Chaplain  Spiritual Encounters  Spiritual Needs Prayer;Emotional  [place] entered room and noticed cart with snacks and drinks. Patient was sitting up on hospital bed. Patient was being held by daughters and members of support system. Support system was emotive and begged CH to pray. [place] prayed for peace. Provided the ministry of presence for patient and loved ones as everyone was grieving in their own way. [place] left after several minutes. Pastoral visit was appreciated.

## 2018-10-07 NOTE — Progress Notes (Signed)
Katie Guzman at [place] NAME: Katie Guzman    MR#:  [id number]  DATE OF BIRTH:  1970/05/19  SUBJECTIVE:  CHIEF COMPLAINT:   Chief Complaint  Patient presents with  . Respiratory Distress   -Patient with laryngeal carcinoma, COPD, ongoing smoking, home oxygen presents with acute respiratory distress.  Was on BiPAP in the ICU.  Transition to hospice care and transfer to floor. -Patient is comfortable today on increased morphine drip 2 daughters and aunt  at bedside Shallow prolonged breaths with some gasps in between  REVIEW OF SYSTEMS:  Review of Systems  Unable to perform ROS: Acuity of condition  Constitutional: Positive for malaise/fatigue. Negative for chills and fever.  HENT: Negative for sore throat.   Eyes: Negative for blurred vision and double vision.  Respiratory: Negative for cough, shortness of breath and wheezing.   Cardiovascular: Negative for chest pain and palpitations.  Gastrointestinal: Negative for abdominal pain, constipation, diarrhea, nausea and vomiting.  Genitourinary: Negative for dysuria.  Musculoskeletal: Negative for back pain and myalgias.  Neurological: Negative for dizziness, focal weakness, seizures, weakness and headaches.  Psychiatric/Behavioral: Negative for depression.    DRUG ALLERGIES:   Allergies  Allergen Reactions  . Flexeril [Cyclobenzaprine] Hives and Nausea And Vomiting  . Hydrocodone Hives  . Other Hives  . Darvocet [Propoxyphene N-Acetaminophen] Hives and Nausea And Vomiting  . Fish-Derived Products Other (See Comments)    Patient does not like to eat fish. Not allergic but prefers not to.  . Propofol Nausea And Vomiting  . Toradol [Ketorolac Tromethamine] Nausea And Vomiting  . Vicodin [Hydrocodone-Acetaminophen] Hives and Nausea And Vomiting  . Ketorolac Nausea And Vomiting  . Tramadol Nausea And Vomiting    Vomiting only    VITALS:  Blood pressure 115/66, pulse 95,  temperature 98.6 F (37 C), temperature source Oral, resp. rate 18, height _0  (1.575 m), weight 51.9 kg, SpO2 96 %.  PHYSICAL EXAMINATION:  Physical Exam  GENERAL:  48 y.o.-year-old patient lying in the bed, chronically ill-appearing.  Complaints of dyspnea. EYES: Pupils equal, round, reactive to light and accommodation. No scleral icterus. Extraocular muscles intact.  HEENT: Head atraumatic, normocephalic. Oropharynx and nasopharynx clear.  NECK:  Supple, no jugular venous distention. No thyroid enlargement, no tenderness.  LUNGS: Coarse, rhonchorous breath sounds bilaterally, decreased at the bases.  Shallow prolonged breaths with some gasping in between.  CARDIOVASCULAR: S1, S2 normal. No murmurs, rubs, or gallops.  ABDOMEN: Soft, nontender, nondistended. Bowel sounds present.  EXTREMITIES: No pedal edema, cyanosis, or clubbing.  NEUROLOGIC:  Sedated with morphine  pSYCHIATRIC: The patient is  sedated from morphine SKIN: No obvious rash, lesion, or ulcer.    LABORATORY PANEL:   CBC Recent Labs  Lab 09/20/2018 1041  WBC 18.0*  HGB 11.1*  HCT 35.7*  PLT 485*   ------------------------------------------------------------------------------------------------------------------  Chemistries  Recent Labs  Lab 09/23/2018 1041  NA 127*  K 4.8  CL 86*  CO2 32  GLUCOSE 223*  BUN 13  CREATININE 0.45  CALCIUM 8.8*  AST 16  ALT 14  ALKPHOS 108  BILITOT 0.5   ------------------------------------------------------------------------------------------------------------------  Cardiac Enzymes No results for input(s): TROPONINI in the last 168 hours. ------------------------------------------------------------------------------------------------------------------  RADIOLOGY:  No results found.  EKG:   Orders placed or performed during the hospital encounter of 09/19/2018  . ED EKG  . ED EKG  . EKG 12-Lead  . EKG 12-Lead    ASSESSMENT AND PLAN:   Katie Guzman  is  a 48  y.o. 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  Patient got admitted to ICU for acute hypoxic respiratory failure secondary to COPD and pneumonia.  Was on BiPAP, was also noted to be extremely delirious.  Patient has had significant decline in her physical health since 2019/since her cancer was diagnosed. Severe odynophagia from radiation, 3 ICU hospital admissions within the last 3 weeks.  Most recent laryngoscopy performed at [facility] showing significant necrosis of the epiglottis and the vocal cords cartilage.  ICU attending had discussion with patient's daughters and they have transitioned patient to comfort care. She was placed on high flow nasal cannula.  Patient is more alert and is transition to floor status.  Palliative care has met with the family.  Plan is possible discharge home with hospice services. -However patient's daughter requested to start antibiotics last night and also requested for IV fluids.  Palliative care to discuss goals of hospice care at this time.  Below are her current diagnosis:  1.  Acute chronic hypoxic respiratory failure-secondary to COPD exacerbation and HCAP -Patient has had 2 ICU hospitalizations within the last 3 weeks.  1 of these hospitalizations, she was even intubated. -Vancomycin discontinued.  Currently  family is considering hospice care at home -Morphine drip titrate as needed to keep her comfortable - 2.  Chronic pain-continue oxycodone.  Patient follows with pain management clinic.     3.  Diabetes mellitus-monitor sugars as needed.  4. Hyponatremia- hypovolemia vs SIADH from squamous cell carc  5.  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  Patient finished radiation in December 2019.     -Very poor prognosis, palliative care has seen the patient and plan is to discharge patient home with hospice care.  Patient is very very unsteady today Using accessory muscles to breathe, unsteady for discharge Plan is to titrate the morphine drip to keep the patient comfortable Anticipating hospital death soon.  Palliative care is following.  Chaplain is consulting family  Patient's daughter Vikki Ports and other daughter at bedside      All the records are reviewed and case discussed with Care Management/Social Workerr. Management plans discussed with the patient's 2 daughters at bedside and they are in agreement.  CODE STATUS: DNR  TOTAL TIME TAKING CARE OF THIS PATIENT: 25 minutes.   POSSIBLE D/C IN ? DAYS, DEPENDING ON CLINICAL CONDITION.   Nicholes Mango M.D on 10/07/2018 at 12:04 PM  Between 7am to 6pm - Pager - [phone]  After 6pm go to [email or web address] - password [email or web address] [facility]  Office  [phone]  CC: Primary care physician; Medicine, [facility]

## 2018-10-08 NOTE — Progress Notes (Signed)
[place] at [place] NAME: Katie Guzman    MR#:  [id number]  DATE OF BIRTH:  01-09-1971  SUBJECTIVE:  CHIEF COMPLAINT:   Chief Complaint  Patient presents with  . Respiratory Distress   -Patient with laryngeal carcinoma, COPD, ongoing smoking, home oxygen presents with acute respiratory distress.  Was on BiPAP in the ICU.  Transition to hospice care and transfer to floor.  -Patient is comfortable on increased morphine drip and resting comfortably. 2 daughters and aunt  at bedside  REVIEW OF SYSTEMS:  Unobtainable due to medical condition  DRUG ALLERGIES:   Allergies  Allergen Reactions  . Flexeril [Cyclobenzaprine] Hives and Nausea And Vomiting  . Hydrocodone Hives  . Other Hives  . Darvocet [Propoxyphene N-Acetaminophen] Hives and Nausea And Vomiting  . Fish-Derived Products Other (See Comments)    Patient does not like to eat fish. Not allergic but prefers not to.  . Propofol Nausea And Vomiting  . Toradol [Ketorolac Tromethamine] Nausea And Vomiting  . Vicodin [Hydrocodone-Acetaminophen] Hives and Nausea And Vomiting  . Ketorolac Nausea And Vomiting  . Tramadol Nausea And Vomiting    Vomiting only    VITALS:  Blood pressure 114/66, pulse 91, temperature 98.6 F (37 C), temperature source Oral, resp. rate 12, height '5\' 2"'  (1.575 m), weight 51.9 kg, SpO2 96 %.  PHYSICAL EXAMINATION:  Physical Exam  GENERAL:  48 y.o.-year-old patient lying in the bed, chronically ill-appearing.  Complaints of dyspnea. EYES: Pupils equal, round, reactive to light and accommodation. No scleral icterus. Extraocular muscles intact.  HEENT: Head atraumatic, normocephalic. Oropharynx and nasopharynx clear.  NECK:  Supple, no jugular venous distention. No thyroid enlargement, no tenderness.  LUNGS: Coarse, rhonchorous breath sounds bilaterally, decreased at the bases.  Shallow prolonged breaths with some gasping in between.  CARDIOVASCULAR: S1,  S2 normal. No murmurs, rubs, or gallops.  ABDOMEN: Soft, nontender, nondistended. Bowel sounds present.  EXTREMITIES: No pedal edema, cyanosis, or clubbing.  NEUROLOGIC:  Sedated with morphine  pSYCHIATRIC: The patient is  sedated from morphine SKIN: No obvious rash, lesion, or ulcer.    LABORATORY PANEL:   CBC Recent Labs  Lab 09/12/2018 1041  WBC 18.0*  HGB 11.1*  HCT 35.7*  PLT 485*   ------------------------------------------------------------------------------------------------------------------  Chemistries  Recent Labs  Lab 09/26/2018 1041  NA 127*  K 4.8  CL 86*  CO2 32  GLUCOSE 223*  BUN 13  CREATININE 0.45  CALCIUM 8.8*  AST 16  ALT 14  ALKPHOS 108  BILITOT 0.5   ------------------------------------------------------------------------------------------------------------------  Cardiac Enzymes No results for input(s): TROPONINI in the last 168 hours. ------------------------------------------------------------------------------------------------------------------  RADIOLOGY:  No results found.  EKG:   Orders placed or performed during the hospital encounter of 10/03/2018  . ED EKG  . ED EKG  . EKG 12-Lead  . EKG 12-Lead    ASSESSMENT AND PLAN:   Katie Guzman  is a 48 y.o. 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  Patient got admitted to ICU for acute hypoxic respiratory failure secondary to COPD and pneumonia.  Was on BiPAP, was also noted to be extremely delirious.  Patient has had significant decline in her physical health since 2019/since her cancer was diagnosed. Severe odynophagia from radiation, 3 ICU hospital admissions within the last 3 weeks.  Most recent laryngoscopy performed at [facility] showing significant necrosis of the epiglottis and the vocal cords cartilage.  ICU attending had discussion with patient's daughters and they have transitioned patient to comfort care. She was placed on high flow nasal cannula.  Patient is is resting comfortably this morning with family at bedside.   Anticipate hospital death Palliative care has met with the family.  Plan is possible discharge home with hospice services. Below are her current diagnosis:  1.  Acute chronic hypoxic respiratory failure-secondary to COPD exacerbation and HCAP -Patient has had 2 ICU hospitalizations within the last 3 weeks.  1 of these hospitalizations, she was even intubated. -Vancomycin discontinued.  2.  Chronic pain-continue oxycodone.  Patient follows with pain management clinic.    Currently on comfort care with morphine drip  3.  Diabetes mellitus-monitor sugars as needed.  4. Hyponatremia- hypovolemia vs SIADH from squamous cell carc  5.  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  Patient finished radiation in December 2019.     -Very poor prognosis, palliative care has seen the patient and plan is to discharge patient home with hospice care.  Patient is very very unsteady today.  Patient on morphine drip.  Anticipate hospital death Patient's daughter Vikki Ports and other daughter at bedside    All the records are reviewed and case discussed with Care Management/Social Workerr. Management plans discussed with the patient's 2 daughters at bedside and they are in agreement.  CODE STATUS: DNR  TOTAL TIME TAKING CARE OF THIS PATIENT: 27 minutes.   POSSIBLE D/C IN 1-2DAYS, DEPENDING ON CLINICAL CONDITION.   Katie Guzman M.D on 10/08/2018 at 12:36 PM  Between 7am to 6pm - Pager - [phone]  After 6pm go to [email or web address] - password [email or web address] [email or web address] [facility]  Office  [phone]  CC: Primary  care physician; Medicine, [facility]

## 2018-10-09 NOTE — Progress Notes (Signed)
[place] at [place] NAME: Katie Guzman    MR#:  [id number]  DATE OF BIRTH:  01-16-1971  SUBJECTIVE:  CHIEF COMPLAINT:   Chief Complaint  Patient presents with  . Respiratory Distress   -Patient with laryngeal carcinoma, COPD, ongoing smoking, home oxygen presents with acute respiratory distress.  Was on BiPAP in the ICU.  Currently on oxygen via nasal cannula transition to hospice care and transfer to floor.  -Patient is comfortable on increased morphine drip and resting comfortably. 2 daughters and aunt  at bedside  REVIEW OF SYSTEMS:  Unobtainable due to medical condition  DRUG ALLERGIES:   Allergies  Allergen Reactions  . Flexeril [Cyclobenzaprine] Hives and Nausea And Vomiting  . Hydrocodone Hives  . Other Hives  . Darvocet [Propoxyphene N-Acetaminophen] Hives and Nausea And Vomiting  . Fish-Derived Products Other (See Comments)    Patient does not like to eat fish. Not allergic but prefers not to.  . Propofol Nausea And Vomiting  . Toradol [Ketorolac Tromethamine] Nausea And Vomiting  . Vicodin [Hydrocodone-Acetaminophen] Hives and Nausea And Vomiting  . Ketorolac Nausea And Vomiting  . Tramadol Nausea And Vomiting    Vomiting only    VITALS:  Blood pressure 114/66, pulse 91, temperature 98.6 F (37 C), temperature source Oral, resp. rate 12, height _0  (1.575 m), weight 51.9 kg, SpO2 96 %.  PHYSICAL EXAMINATION:  Physical Exam  GENERAL:  48 y.o.-year-old patient lying in the bed, chronically ill-appearing.  Complaints of dyspnea. EYES: Pupils equal, round, reactive to light and accommodation. No scleral icterus. Extraocular muscles intact.  HEENT: Head atraumatic, normocephalic. Oropharynx and nasopharynx clear.  NECK:  Supple, no jugular venous distention. No thyroid enlargement, no tenderness.  LUNGS: Coarse, rhonchorous breath sounds bilaterally, decreased at the bases.  Shallow prolonged breaths with some  gasping in between.  CARDIOVASCULAR: S1, S2 normal. No murmurs, rubs, or gallops.  ABDOMEN: Soft, nontender, nondistended. Bowel sounds present.  EXTREMITIES: No pedal edema, cyanosis, or clubbing.  NEUROLOGIC:  Sedated with morphine  pSYCHIATRIC: The patient is  sedated from morphine SKIN: No obvious rash, lesion, or ulcer.    LABORATORY PANEL:   CBC No results for input(s): WBC, HGB, HCT, PLT in the last 168 hours. ------------------------------------------------------------------------------------------------------------------  Chemistries  No results for input(s): NA, K, CL, CO2, GLUCOSE, BUN, CREATININE, CALCIUM, MG, AST, ALT, ALKPHOS, BILITOT in the last 168 hours.  Invalid input(s): GFRCGP ------------------------------------------------------------------------------------------------------------------  Cardiac Enzymes No results for input(s): TROPONINI in the last 168 hours. ------------------------------------------------------------------------------------------------------------------  RADIOLOGY:  No results found.  EKG:   Orders placed or performed during the hospital encounter of 09/18/2018  . ED EKG  . ED EKG  . EKG 12-Lead  . EKG 12-Lead    ASSESSMENT AND PLAN:   Katie Guzman  is a 48 y.o. 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  Patient got admitted to ICU for acute hypoxic respiratory failure secondary to COPD and pneumonia.  Was on BiPAP, was also noted to be extremely delirious.  Patient has had significant decline in her physical health since 2019/since her cancer was diagnosed. Severe odynophagia from radiation, 3 ICU hospital admissions within the last 3 weeks.  Most recent laryngoscopy performed at [facility] showing significant necrosis of the epiglottis and the vocal cords  cartilage.  ICU attending had discussion with patient's daughters and they have transitioned patient to comfort care. She was placed on high flow nasal cannula.  Patient is is resting comfortably this morning with family at bedside.   Anticipate hospital death Palliative care has met with the family.  Plan is possible discharge home with hospice services. If patient still here by tomorrow, I will discuss with case manager to make arrangements for discharge home with hospice.  Daughter is agreeable.  1.  Acute chronic hypoxic respiratory failure-secondary to COPD exacerbation and HCAP -Patient has had 2 ICU hospitalizations within the last 3 weeks.  1 of these hospitalizations, she was even intubated. -Vancomycin discontinued.  2.  Chronic pain-continue oxycodone.  Patient follows with pain management clinic.    Currently on comfort care with morphine drip  3.  Diabetes mellitus-monitor sugars as needed.  4. Hyponatremia- hypovolemia vs SIADH from squamous cell carc  5.  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  Patient finished radiation in December 2019.     -Very poor prognosis, palliative care has seen the patient and plan is to discharge patient home with hospice care.  Patient is very very unsteady today.  Patient on morphine drip.  Anticipate hospital death Patient's daughter Vikki Ports and other daughter at bedside    All the records are reviewed and case discussed with Care Management/Social Workerr. Management plans discussed with the patient's 2 daughters at bedside and they are in agreement.  CODE STATUS: DNR  TOTAL TIME TAKING CARE OF THIS PATIENT: 26 minutes.   POSSIBLE D/C IN 1-2DAYS, DEPENDING ON CLINICAL CONDITION.   Telecia Larocque M.D on 10/09/2018 at 10:57 AM  Between 7am to 6pm - Pager - [phone]  After  6pm go to [email or web address] - password [email or web address] [facility]  Office  [phone]  CC: Primary care physician; Medicine, [facility]

## 2018-10-11 NOTE — Progress Notes (Signed)
   10/13/2018 1900  Clinical Encounter Type  Visited With Family  Visit Type Death  Referral From Nurse  Spiritual Encounters  Spiritual Needs Prayer;Emotional;Grief support  Stress Factors  Family Stress Factors Loss  Ch was paged for emotional/spiritual support. Pt passed and family members were at bedside. Ch comforted them and prayed with them for peace. Ch also helped them share fond memories of the patient. The visit was appreciated.

## 2018-10-11 NOTE — Progress Notes (Signed)
[place] at [place] NAME: Katie Guzman    MR#:  [id number]  DATE OF BIRTH:  Nov 19, 1970  SUBJECTIVE:  CHIEF COMPLAINT:   Chief Complaint  Patient presents with  . Respiratory Distress   -Patient with laryngeal carcinoma, COPD, ongoing smoking, home oxygen presents with acute respiratory distress.  Was on BiPAP in the ICU.  Currently on oxygen via nasal cannula transition to hospice care and transfer to floor.  -Patient is comfortable on increased morphine drip and resting comfortably. 2 daughters and aunt  at bedside  REVIEW OF SYSTEMS:  Unobtainable due to medical condition  DRUG ALLERGIES:   Allergies  Allergen Reactions  . Flexeril [Cyclobenzaprine] Hives and Nausea And Vomiting  . Hydrocodone Hives  . Other Hives  . Darvocet [Propoxyphene N-Acetaminophen] Hives and Nausea And Vomiting  . Fish-Derived Products Other (See Comments)    Patient does not like to eat fish. Not allergic but prefers not to.  . Propofol Nausea And Vomiting  . Toradol [Ketorolac Tromethamine] Nausea And Vomiting  . Vicodin [Hydrocodone-Acetaminophen] Hives and Nausea And Vomiting  . Ketorolac Nausea And Vomiting  . Tramadol Nausea And Vomiting    Vomiting only    VITALS:  Blood pressure (!) 101/47, pulse 100, temperature 98.6 F (37 C), temperature source Oral, resp. rate 17, height '5\' 2"'  (1.575 m), weight 51.9 kg, SpO2 (!) 88 %.  PHYSICAL EXAMINATION:  Physical Exam  GENERAL:  48 y.o.-year-old patient lying in the bed, chronically ill-appearing.  Complaints of dyspnea. EYES: Pupils equal, round, reactive to light and accommodation. No scleral icterus. Extraocular muscles intact.  HEENT: Head atraumatic, normocephalic. Oropharynx and nasopharynx clear.  NECK:  Supple, no jugular venous distention. No thyroid enlargement, no tenderness.  LUNGS: Coarse, rhonchorous breath sounds bilaterally, decreased at the bases.  Shallow prolonged breaths with  some gasping in between.  CARDIOVASCULAR: S1, S2 normal. No murmurs, rubs, or gallops.  ABDOMEN: Soft, nontender, nondistended. Bowel sounds present.  EXTREMITIES: No pedal edema, cyanosis, or clubbing.  NEUROLOGIC:  Sedated with morphine  pSYCHIATRIC: The patient is  sedated from morphine SKIN: No obvious rash, lesion, or ulcer.    LABORATORY PANEL:   CBC No results for input(s): WBC, HGB, HCT, PLT in the last 168 hours. ------------------------------------------------------------------------------------------------------------------  Chemistries  No results for input(s): NA, K, CL, CO2, GLUCOSE, BUN, CREATININE, CALCIUM, MG, AST, ALT, ALKPHOS, BILITOT in the last 168 hours.  Invalid input(s): GFRCGP ------------------------------------------------------------------------------------------------------------------  Cardiac Enzymes No results for input(s): TROPONINI in the last 168 hours. ------------------------------------------------------------------------------------------------------------------  RADIOLOGY:  No results found.  EKG:   Orders placed or performed during the hospital encounter of 09/20/2018  . ED EKG  . ED EKG  . EKG 12-Lead  . EKG 12-Lead    ASSESSMENT AND PLAN:   Katie Guzman  is a 48 y.o. 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  Patient got admitted to ICU for acute hypoxic respiratory failure secondary to COPD and pneumonia.  Was on BiPAP, was also noted to be extremely delirious.  Patient has had significant decline in her physical health since 2019/since her cancer was diagnosed. Severe odynophagia from radiation, 3 ICU hospital admissions within the last 3 weeks.  Most recent laryngoscopy performed at  [facility] showing significant necrosis of the epiglottis and the  vocal cords cartilage.  ICU attending had discussion with patient's daughters and they have transitioned patient to comfort care. She was placed on high flow nasal cannula.  Patient is is resting comfortably this morning with family at bedside.   Anticipate hospital death Palliative care has met with the family.  I was notified by nursing staff blood patient was having a lot of pain on respiratory distress previously.  Morphine drip has been the only thing that has kept her comfortable.  Patient requires continuation of morphine drip for comfort care.  Hold off on discharge home.  Anticipating hospital death.  1.  Acute chronic hypoxic respiratory failure-secondary to COPD exacerbation and HCAP -Patient has had 2 ICU hospitalizations within the last 3 weeks.  1 of these hospitalizations, she was even intubated. -Vancomycin discontinued.  2.  Chronic pain-continue oxycodone.  Patient follows with pain management clinic.    Currently on comfort care with morphine drip  3.  Diabetes mellitus-monitor sugars as needed.  4. Hyponatremia- hypovolemia vs SIADH from squamous cell carc  5.  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  Patient finished radiation in December 2019.     -Very poor prognosis, palliative care has seen the patient and plan is to discharge patient home with hospice care.  Patient is very very unsteady today.  Patient on morphine drip.  Anticipate hospital death  All the records are reviewed and case discussed with Care Management/Social Workerr. Management plans discussed with the patient's  daughter at bedside and they are in agreement.  CODE STATUS: DNR  TOTAL TIME TAKING CARE OF THIS PATIENT: 25 minutes.   Katie Guzman M.D on 10-19-18 at 3:17 PM  Between 7am to 6pm - Pager - [phone]  After 6pm go to [email or web address]  - password [email or web address] [place] [facility]  Office  [phone]  CC: Primary care physician; Medicine, [facility]

## 2018-10-11 NOTE — Progress Notes (Signed)
Family called this nurse to the room to listen to patient.  Patient has shallow, irregular respirations. Pateint thinks she sound wheezy, however lungs sounds are clear. Patient has had no urine outpatient this shift.  Family concerned about the patient swelling, at this time her hands are very mildly swollen.  Family agreeable to discontiniuing IV fluids at this time. Will make Dr. Stark Jock aware. Clarise Cruz, BSN

## 2018-10-11 NOTE — Progress Notes (Signed)
Palliative: Conference with bedside nursing staff and case management related to patient condition, needs, disposition.  Mrs. Roffman is now needing continuous infusion of morphine at 5 mg/h to reduce symptom burden.  She is comfortable at this rate.  Mrs. Villaverde would benefit from weaning oxygen to off.  It seems that the use of oxygen is prolonging her dying process, thereby prolonging her suffering.    Plan: Wean oxygen to off as tolerated, family allows. Anticipate hospital death, unable to transition to residential hospice at this point due to symptom burden, family wishes.  No charge Quinn Axe, NP Palliative Medicine Team Team Phone # [phone] Greater than 50% of this time was spent counseling and coordinating care related to the above assessment and plan.

## 2018-10-11 NOTE — Progress Notes (Signed)
[facility] finally reached after 5 attempts.  Patient will need an eye prep when family leaves.  Reference [id number] [employer]

## 2018-10-11 NOTE — Progress Notes (Signed)
This nurse called to room by family stating patient not breathing. Patient took 3 more breaths when no more respirations were noted and patient with no pulse.  Patient pronounced by Clarise Cruz, RN and Siri Cole, RN. Multiple family members at bedside.  Dr. Anselm Jungling made aware as well as AC.  Clarise Cruz, BSN

## 2018-10-11 NOTE — Progress Notes (Signed)
Family members comforted, left around 2030. Post mortem care performed: foley catheter, IV, patches removed, patient taken to morgue. Stacie Glaze, RN

## 2018-10-11 DEATH — deceased

## 2018-11-10 NOTE — Death Summary Note (Signed)
[place] [facility] [place] NAME: Katie Guzman    MR#:  [id number]  DATE OF BIRTH:  November 25, 1970  DATE OF ADMISSION:  09/14/2018   ADMITTING PHYSICIAN: Gladstone Lighter, MD  DATE OF DISCHARGE: 10-26-2018  6:56 PM  PRIMARY CARE PHYSICIAN: [facility], [facility]   ADMISSION DIAGNOSIS:  Respiratory distress [R06.03] Community acquired pneumonia, unspecified laterality [J18.9] DISCHARGE DIAGNOSIS:  Active Problems:   Acute respiratory failure ([place])  SECONDARY DIAGNOSIS:   Past Medical History:  Diagnosis Date   Asthma    Cancer ([place]) 2019   laryngeal cancer   Cellulitis 04/15/2015   COPD (chronic obstructive pulmonary disease) (H[facility])    Diabetes mellitus    Poorly controlled with complications   Emphysema    Fibromyalgia    Neuropathy    Tenosynovitis of foot 02-May-2015   HOSPITAL COURSE:   Death summary   chief complaint; shortness of breath History of presenting complaint; Katie Guzman  is a 48 y.o. female with a known COPD, diabetes mellitus, neuropathy and other medical problems was extremely short of breath and as shortness of breath has been worsening EMS were called and patient was placed on nonrebreather and brought into the emergency department.  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  Patient was complaining of dyspnea and unable to read she is placed on BiPAP and hospitalist team was called admit the patient.    Hospital course; Patient got admitted to ICU for acute hypoxic respiratory failure secondary to COPD and pneumonia.  Was on BiPAP, was also noted to be extremely delirious.  Patient has had significant decline in her physical health since 2019/since her cancer was diagnosed. Severe odynophagia from radiation, 3 ICU hospital admissions within the last 3 weeks.  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  ICU attending had discussion with patient's daughters and they have transitioned patient to comfort care.She was placed on high flow nasal cannula. Anticipated hospital death. Palliative care has met with the family.   Was managed comfortably on morphine drip during this admission.  Patient subsequently died on October 26, 2018 at 6:56pm.  Family was at bedside.  DISCHARGE CONDITIONS:  Patient died CONSULTS OBTAINED:   DRUG ALLERGIES:   Allergies  Allergen Reactions   Flexeril [Cyclobenzaprine] Hives and Nausea And Vomiting   Hydrocodone Hives   Other Hives   Darvocet [Propoxyphene N-Acetaminophen] Hives and Nausea And Vomiting   Fish-Derived Products Other (See Comments)    Patient does not like to eat fish. Not allergic but prefers not to.   Propofol Nausea And Vomiting   Toradol [Ketorolac Tromethamine] Nausea And Vomiting   Vicodin [Hydrocodone-Acetaminophen] Hives and Nausea And Vomiting   Ketorolac Nausea And Vomiting   Tramadol Nausea And Vomiting    Vomiting only   DISCHARGE MEDICATIONS:   Allergies as of 10-26-18      Reactions   Flexeril [cyclobenzaprine] Hives, Nausea And Vomiting   Hydrocodone Hives   Other Hives   Darvocet [propoxyphene N-acetaminophen] Hives, Nausea And Vomiting   Fish-derived Products Other (See Comments)   Patient does not like to eat fish. Not allergic but prefers not to.   Propofol Nausea And Vomiting   Toradol [ketorolac Tromethamine] Nausea And Vomiting   Vicodin [hydrocodone-acetaminophen] Hives, Nausea And Vomiting   Ketorolac Nausea And Vomiting   Tramadol Nausea And Vomiting   Vomiting only      Medication List  STOP taking these medications   ascorbic acid 250 MG tablet Commonly known as: VITAMIN C   dexamethasone 4 MG tablet Commonly known as: DECADRON   fluticasone 50 MCG/ACT nasal spray Commonly known as: FLONASE   glycerin adult 2 g suppository   ibuprofen 800 MG tablet Commonly known  as: ADVIL   insulin glargine 100 UNIT/ML injection Commonly known as: LANTUS   insulin lispro 100 UNIT/ML injection Commonly known as: HUMALOG   pantoprazole 40 MG tablet Commonly known as: PROTONIX   polyethylene glycol 17 g packet Commonly known as: MIRALAX / GLYCOLAX     TAKE these medications   acetaminophen 325 MG tablet Commonly known as: TYLENOL Take 650 mg by mouth every 6 (six) hours as needed for mild pain.   albuterol (2.5 MG/3ML) 0.083% nebulizer solution Commonly known as: PROVENTIL Take 3 mLs (2.5 mg total) by nebulization every 4 (four) hours as needed for wheezing or shortness of breath. What changed: Another medication with the same name was removed. Continue taking this medication, and follow the directions you see here.   escitalopram 10 MG tablet Commonly known as: LEXAPRO Take 2 tablets (20 mg total) by mouth every morning.   fentaNYL 25 MCG/HR Commonly known as: [place] 1 patch onto the skin every 3 (three) days.   Fluticasone-Salmeterol 250-50 MCG/DOSE Aepb Commonly known as: ADVAIR Inhale 1 puff into the lungs every 12 (twelve) hours.   gabapentin 800 MG tablet Commonly known as: NEURONTIN Take 1 tablet (800 mg total) by mouth 3 (three) times daily.   lidocaine 5 % Commonly known as: LIDODERM Place 1 patch onto the skin daily as needed. For pain. Remove & Discard patch within 12 hours or as directed by MD   LORazepam 1 MG tablet Commonly known as: Ativan Take 1 tablet (1 mg total) by mouth every 8 (eight) hours as needed for anxiety.   mirtazapine 7.5 MG tablet Commonly known as: REMERON Take 1 tablet (7.5 mg total) by mouth Nightly.   nicotine 21 mg/24hr patch Commonly known as: NICODERM CQ - dosed in mg/24 hours Place 1 patch (21 mg total) onto the skin daily.   oxyCODONE 5 MG immediate release tablet Commonly known as: Oxy IR/ROXICODONE Take 2 tablets (10 mg total) by mouth every 4 (four) hours as needed for severe pain.     scopolamine 1 MG/3DAYS Commonly known as: TRANSDERM-SCOP Place 1 patch (1.5 mg total) onto the skin every 3 (three) days.   senna-docusate 8.6-50 MG tablet Commonly known as: Senokot-S Take 1 tablet by mouth daily.   tiotropium 18 MCG inhalation capsule Commonly known as: SPIRIVA Place 1 capsule (18 mcg total) into inhaler and inhale daily.        DISCHARGE INSTRUCTIONS:   DIET:   DISCHARGE CONDITION:  Expired ACTIVITY:   OXYGEN:  Home Oxygen:   Oxygen Delivery:  DISCHARGE LOCATION:  Patient died   If you experience worsening of your admission symptoms, develop shortness of breath, life threatening emergency, suicidal or homicidal thoughts you must seek medical attention immediately by calling 911 or calling your MD immediately  if symptoms less severe.  You Must read complete instructions/literature along with all the possible adverse reactions/side effects for all the Medicines you take and that have been prescribed to you. Take any new Medicines after you have completely understood and accpet all the possible adverse reactions/side effects.   Please note  You were cared for by a hospitalist during your hospital stay. If you have any questions  about your discharge medications or the care you received while you were in the hospital after you are discharged, you can call the unit and asked to speak with the hospitalist on call if the hospitalist that took care of you is not available. Once you are discharged, your primary care physician will handle any further medical issues. Please note that NO REFILLS for any discharge medications will be authorized once you are discharged, as it is imperative that you return to your primary care physician (or establish a relationship with a primary care physician if you do not have one) for your aftercare needs so that they can reassess your need for medications and monitor your lab values.    On the day of Discharge:  VITAL SIGNS:   Blood pressure (!) 101/47, pulse 100, temperature 98.6 F (37 C), temperature source Oral, resp. rate 17, height '5\' 2"'  (1.575 m), weight 51.9 kg, SpO2 (!) 88 %. PHYSICAL EXAMINATION:   DATA REVIEW:   CBC No results for input(s): WBC, HGB, HCT, PLT in the last 168 hours.  Chemistries  No results for input(s): NA, K, CL, CO2, GLUCOSE, BUN, CREATININE, CALCIUM, MG, AST, ALT, ALKPHOS, BILITOT in the last 168 hours.  Invalid input(s): [place]   Microbiology Results  Results for orders placed or performed during the hospital encounter of 09/10/2018  Blood culture (routine x 2)     Status: None   Collection Time: 09/28/2018 10:41 AM   Specimen: BLOOD  Result Value Ref Range Status   Specimen Description BLOOD R WRIST  Final   Special Requests   Final    BOTTLES DRAWN AEROBIC AND ANAEROBIC Blood Culture adequate volume   Culture   Final    NO GROWTH 5 DAYS Performed at [facility], [address] [place], [place] [address]    Report Status 10/07/2018 FINAL  Final  Blood culture (routine x 2)     Status: None   Collection Time: 10/08/2018 10:41 AM   Specimen: BLOOD  Result Value Ref Range Status   Specimen Description BLOOD R UPPER FA  Final   Special Requests   Final    BOTTLES DRAWN AEROBIC AND ANAEROBIC Blood Culture adequate volume   Culture   Final    NO GROWTH 5 DAYS Performed at [facility], [address] [place], [place] [address]    Report Status 10/07/2018 FINAL  Final  Novel Coronavirus, NAA (send-out to ref lab)     Status: None   Collection Time: 09/25/2018 11:44 AM   Specimen: Nasopharyngeal Swab; Respiratory  Result Value Ref Range Status   SARS-CoV-2, NAA NOT DETECTED NOT DETECTED Final    Comment: (NOTE) This test was developed and its performance characteristics determined by [employer]. This test has not been FDA cleared or approved. This test has been authorized by FDA under an Emergency Use Authorization (EUA). This test is  only authorized for the duration of time the declaration that circumstances exist justifying the authorization of the emergency use of in vitro diagnostic tests for detection of SARS-CoV-2 virus and/or diagnosis of COVID-19 infection under section 564(b)(1) of the Act, 21 U.S.C. 789FYB-0(F)(7), unless the authorization is terminated or revoked sooner. When diagnostic testing is negative, the possibility of a false negative result should be considered in the context of a patient's recent exposures and the presence of clinical signs and symptoms consistent with COVID-19. An individual without symptoms of COVID-19 and who is not shedding SARS-CoV-2 virus would expect to have a  negative (not detected) result in this assay. Performed  At: [facility] [place], [place] [id number] Rush Farmer MD YI:[id number]    [place]  Final    Comment: Performed at [facility], [address] [place], [place] [address]    RADIOLOGY:  No results found.   Management plans discussed with the patient, family and they are in agreement.  CODE STATUS: Prior   TOTAL TIME TAKING CARE OF THIS PATIENT: 33 minutes.    Windi Toro M.D on 10/11/2018 at 3:37 PM  Between 7am to 6pm - Pager - [phone]  After 6pm go to [email or web address] - Proofreader  [facility]  Office  [phone]  CC: Primary care physician; [facility]   Note: This dictation was prepared with Dragon dictation along with smaller phrase technology. Any transcriptional errors that result from this process are unintentional.

## 2020-05-09 IMAGING — CT CT HEAD WITHOUT CONTRAST
3 of 5 series · 15 of 47 positions shown, 18 images · non-contrast
Comparison: 11/18/2014

CLINICAL DATA: Altered level of consciousness.  Confusion.

EXAM:
CT HEAD WITHOUT CONTRAST
TECHNIQUE: Contiguous axial images were obtained from the base of the skull
through the vertex without intravenous contrast.

[Series 4: head wo · axial · 0.41mm/px · z∈[-122,+28]mm · 9 of 38 slices shown, 12 images]
[im 4/38  brain]
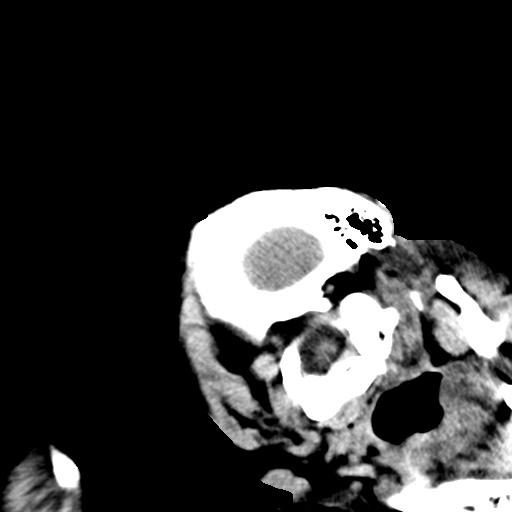
[im 4/38  bone]
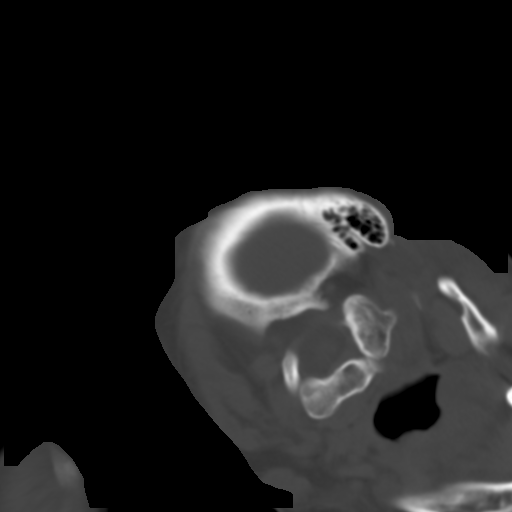
[im 8/38  brain]
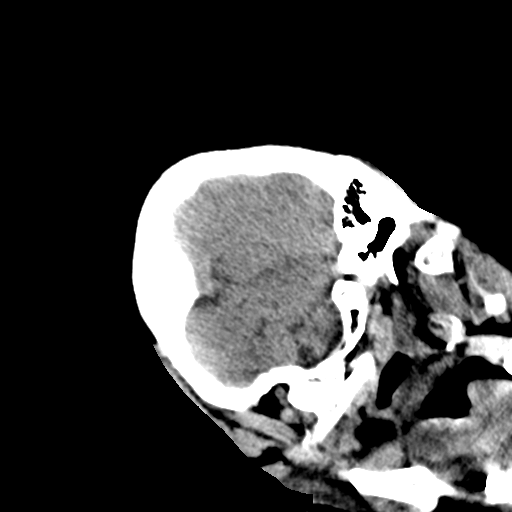
[im 12/38  brain]
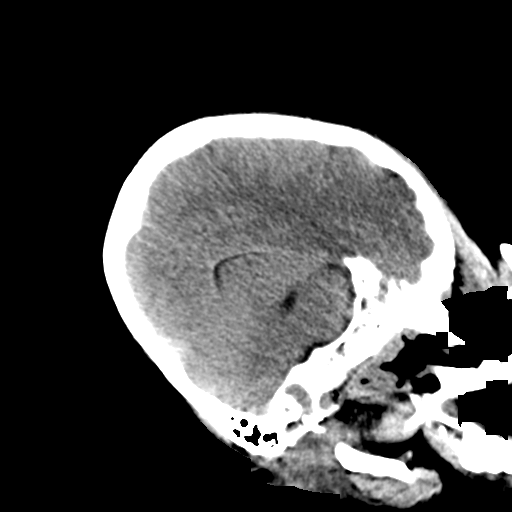
[im 15/38  brain]
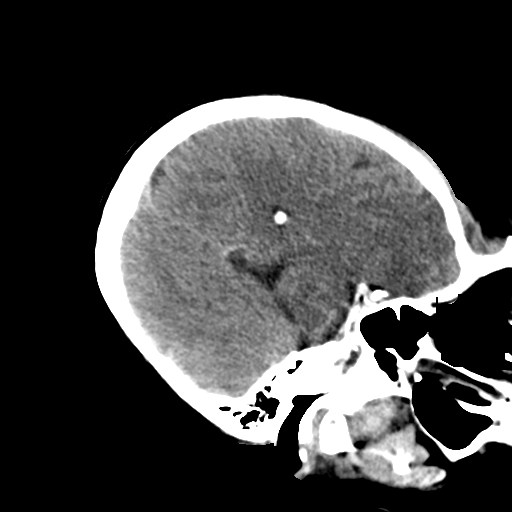
[im 19/38  brain]
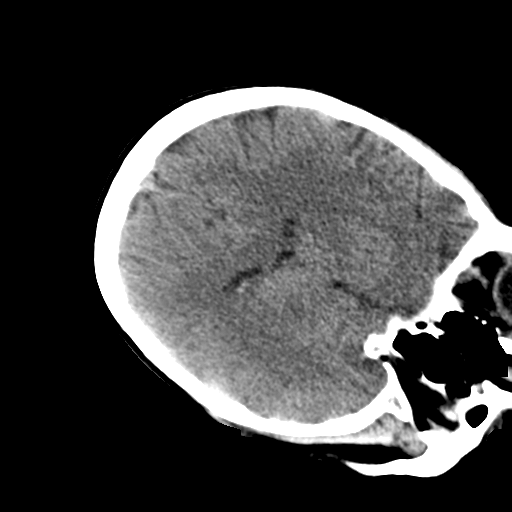
[im 19/38  bone]
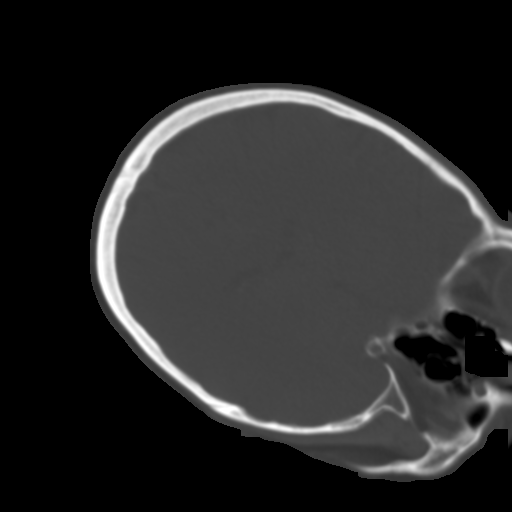
[im 23/38  brain]
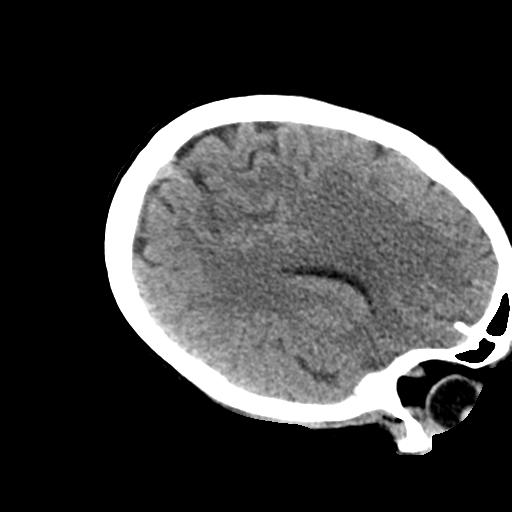
[im 26/38  brain]
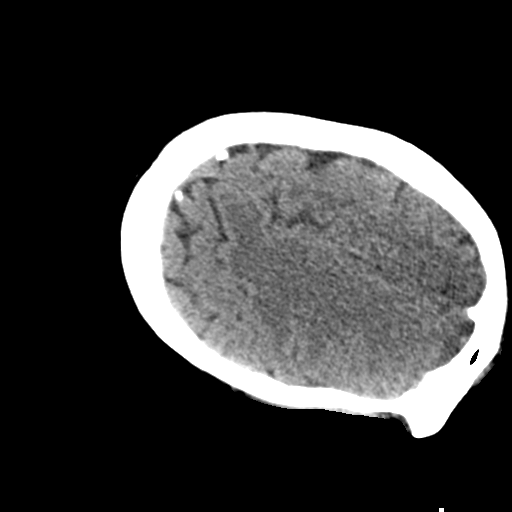
[im 30/38  brain]
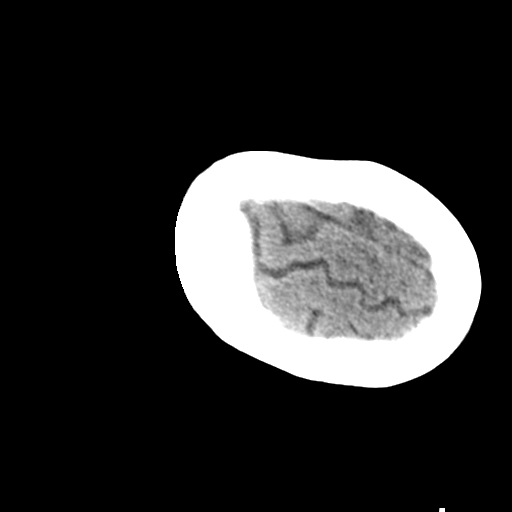
[im 34/38  brain]
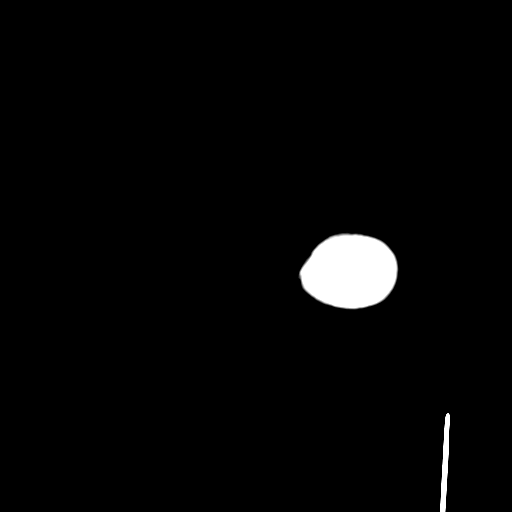
[im 34/38  bone]
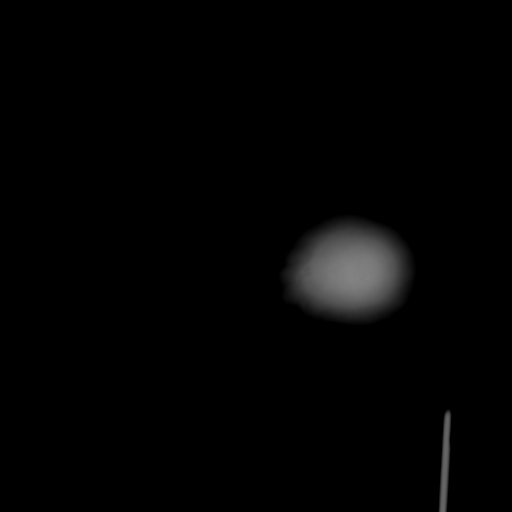

[Series 9: coronal soft tissue · coronal · 0.29mm/px · 3 of 67 slices shown]
[im 23/67  brain]
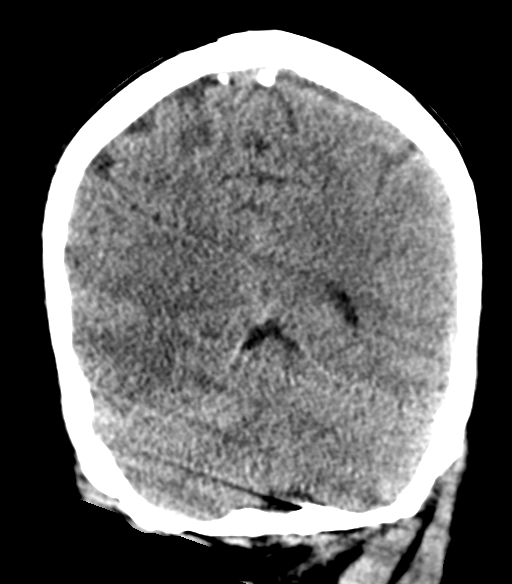
[im 30/67  brain]
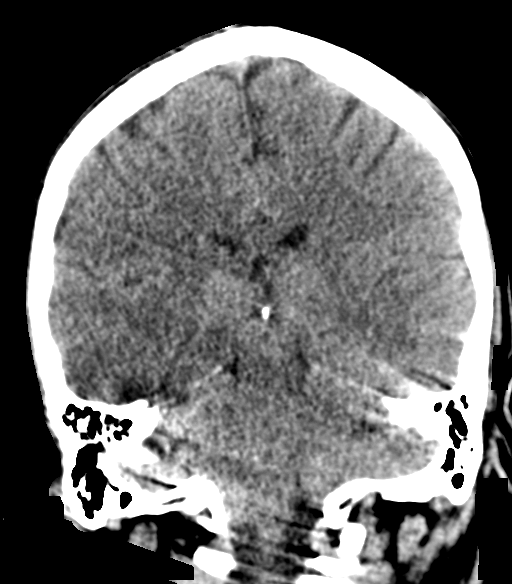
[im 37/67  brain]
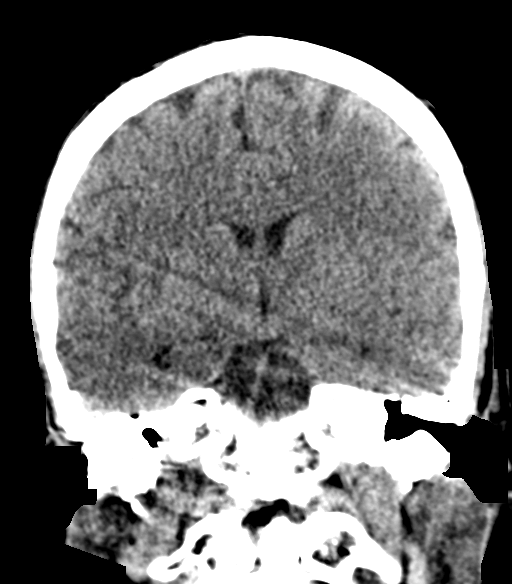

[Series 10: sagittal soft tissue · sagittal · 0.33mm/px · 3 of 47 slices shown]
[im 21/47  brain]
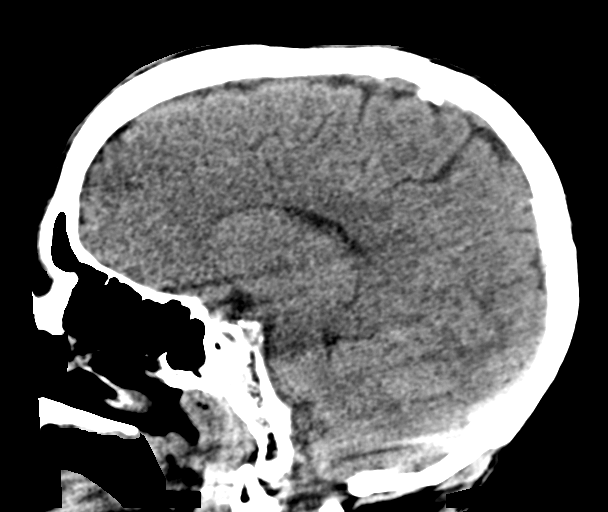
[im 24/47  brain]
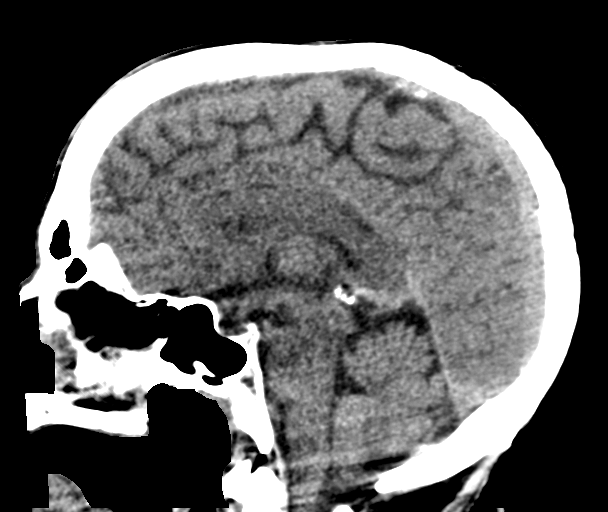
[im 27/47  brain]
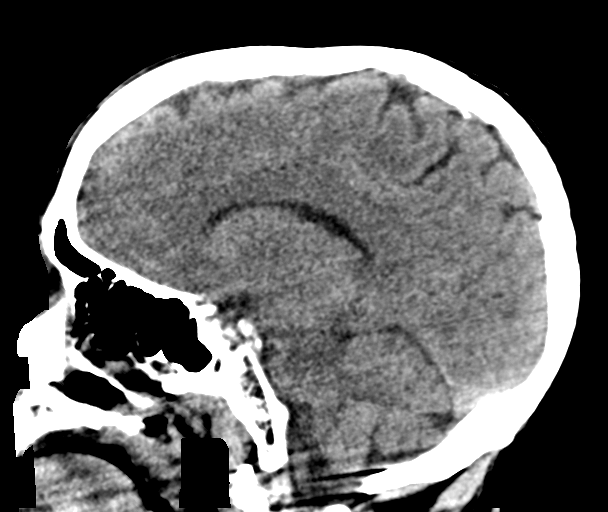

[15 of 47 positions shown; findings below may reference images not displayed]

FINDINGS: Brain: Suboptimal due to motion and atypical patient positioning. No
sign acute infarction, hemorrhage, mass, hydrocephalus or
extra-axial collection.

Vascular: There is atherosclerotic calcification of the major
vessels at the base of the brain.

Skull: Negative

Sinuses/Orbits: Clear/negative

Other: None
IMPRESSION: Motion degraded exam with atypical positioning. No abnormality is
seen.

## 2020-06-03 IMAGING — DX PORTABLE CHEST - 1 VIEW
1 series · 1 of 1 positions shown · non-contrast
Comparison: 09/09/2018 chest radiograph.

CLINICAL DATA: Severe dyspnea.  COPD.  Laryngeal mass.

EXAM:
PORTABLE CHEST 1 VIEW

[chest ap]
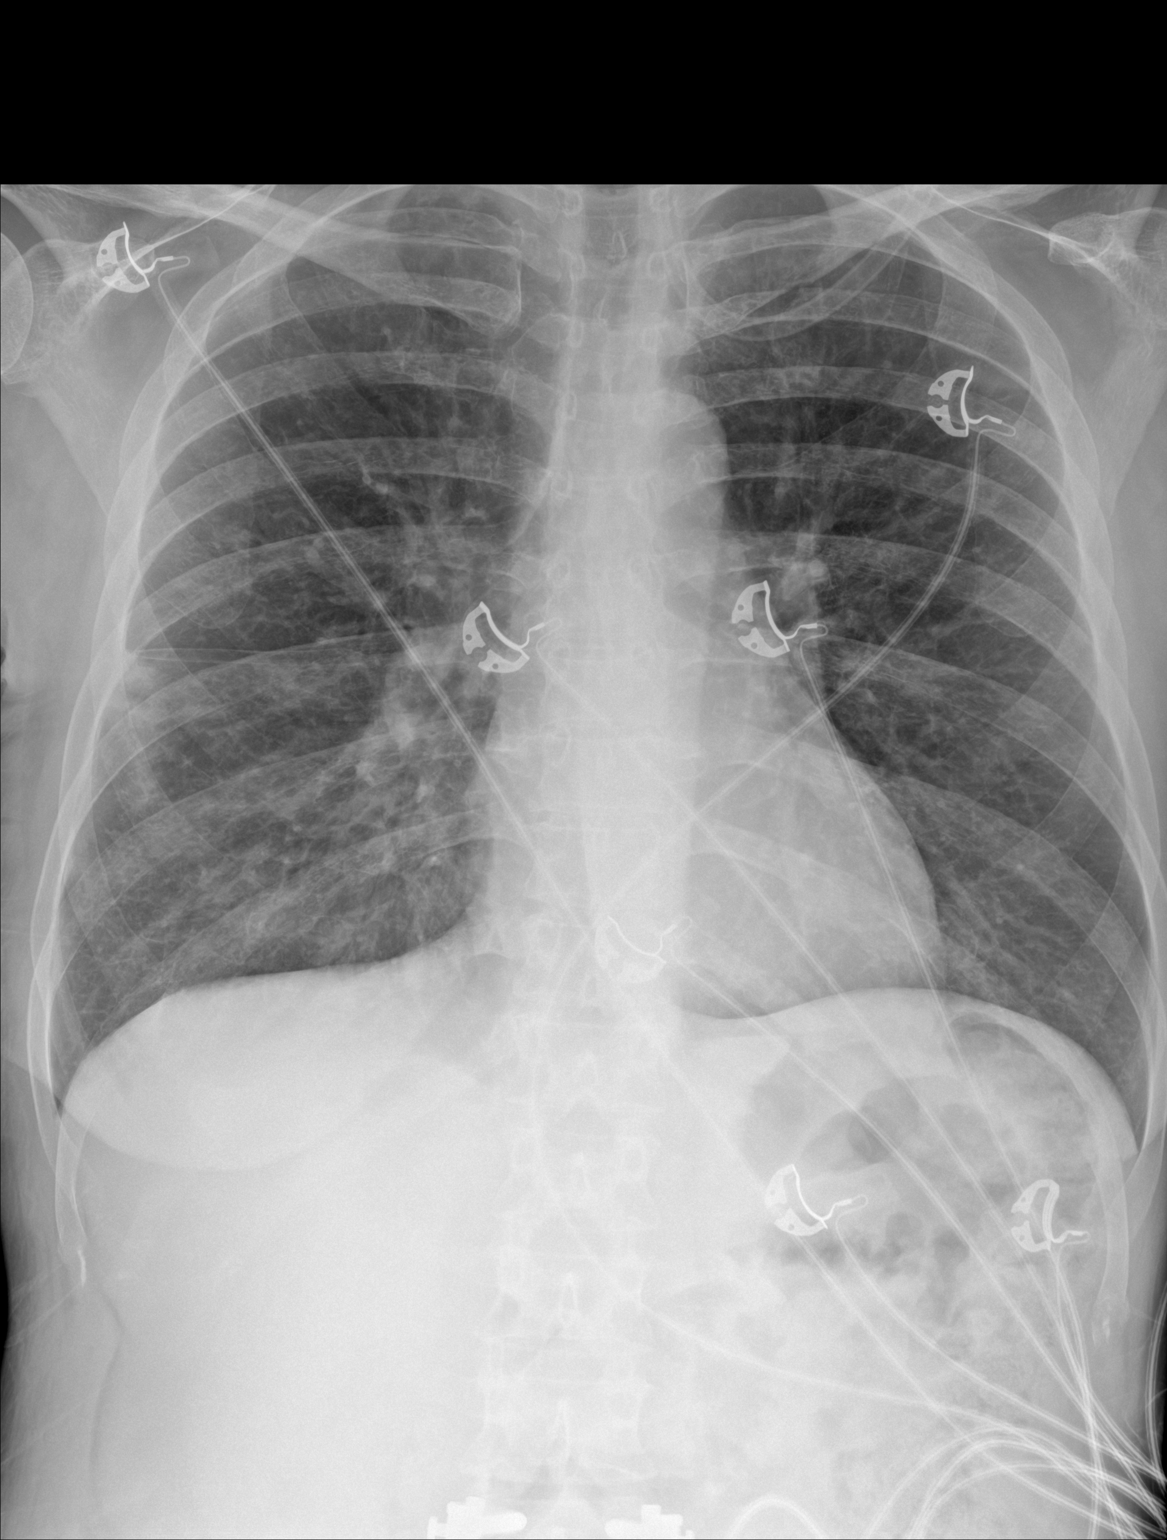

[1 of 1 positions shown; findings below may reference images not displayed]

FINDINGS: Stable cardiomediastinal silhouette with normal heart size. No
pneumothorax. No pleural effusion. Patchy opacity in the peripheral
mid to lower right lung appears new.
IMPRESSION: Patchy peripheral mid to lower right lung opacity appears new,
favoring pneumonia. Close chest radiograph follow-up advised.

## 2020-11-07 NOTE — Telephone Encounter (Signed)
error
